# Patient Record
Sex: Female | Born: 1965 | Race: White | Hispanic: No | Marital: Married | Smoking: Former smoker
Health system: Southern US, Community
[De-identification: ages and names within clinical notes are randomized; demographics above are authoritative.]

## PROBLEM LIST (undated history)

## (undated) DIAGNOSIS — Z8719 Personal history of other diseases of the digestive system: Secondary | ICD-10-CM

## (undated) DIAGNOSIS — E785 Hyperlipidemia, unspecified: Secondary | ICD-10-CM

## (undated) DIAGNOSIS — K829 Disease of gallbladder, unspecified: Secondary | ICD-10-CM

## (undated) DIAGNOSIS — D259 Leiomyoma of uterus, unspecified: Secondary | ICD-10-CM

## (undated) DIAGNOSIS — H269 Unspecified cataract: Secondary | ICD-10-CM

## (undated) DIAGNOSIS — Z8639 Personal history of other endocrine, nutritional and metabolic disease: Secondary | ICD-10-CM

## (undated) DIAGNOSIS — T4145XA Adverse effect of unspecified anesthetic, initial encounter: Secondary | ICD-10-CM

## (undated) DIAGNOSIS — Z9221 Personal history of antineoplastic chemotherapy: Secondary | ICD-10-CM

## (undated) DIAGNOSIS — Z803 Family history of malignant neoplasm of breast: Secondary | ICD-10-CM

## (undated) DIAGNOSIS — Z923 Personal history of irradiation: Secondary | ICD-10-CM

## (undated) DIAGNOSIS — K2210 Ulcer of esophagus without bleeding: Secondary | ICD-10-CM

## (undated) DIAGNOSIS — E079 Disorder of thyroid, unspecified: Secondary | ICD-10-CM

## (undated) DIAGNOSIS — M7500 Adhesive capsulitis of unspecified shoulder: Secondary | ICD-10-CM

## (undated) DIAGNOSIS — N649 Disorder of breast, unspecified: Secondary | ICD-10-CM

## (undated) DIAGNOSIS — K449 Diaphragmatic hernia without obstruction or gangrene: Secondary | ICD-10-CM

## (undated) DIAGNOSIS — H409 Unspecified glaucoma: Secondary | ICD-10-CM

## (undated) DIAGNOSIS — T8859XA Other complications of anesthesia, initial encounter: Secondary | ICD-10-CM

## (undated) DIAGNOSIS — C50919 Malignant neoplasm of unspecified site of unspecified female breast: Secondary | ICD-10-CM

## (undated) HISTORY — PX: BREAST LUMPECTOMY: SHX2

## (undated) HISTORY — DX: Adhesive capsulitis of unspecified shoulder: M75.00

## (undated) HISTORY — DX: Disorder of thyroid, unspecified: E07.9

## (undated) HISTORY — PX: OTHER SURGICAL HISTORY: SHX169

## (undated) HISTORY — DX: Ulcer of esophagus without bleeding: K22.10

## (undated) HISTORY — PX: APPENDECTOMY: SHX54

## (undated) HISTORY — DX: Malignant neoplasm of unspecified site of unspecified female breast: C50.919

## (undated) HISTORY — PX: CATARACT EXTRACTION, BILATERAL: SHX1313

## (undated) HISTORY — DX: Hyperlipidemia, unspecified: E78.5

## (undated) HISTORY — DX: Personal history of other endocrine, nutritional and metabolic disease: Z86.39

## (undated) HISTORY — DX: Family history of malignant neoplasm of breast: Z80.3

## (undated) HISTORY — DX: Unspecified cataract: H26.9

## (undated) HISTORY — PX: ABDOMINAL HYSTERECTOMY: SHX81

## (undated) HISTORY — DX: Disorder of breast, unspecified: N64.9

## (undated) HISTORY — DX: Unspecified glaucoma: H40.9

## (undated) HISTORY — PX: TUBAL LIGATION: SHX77

## (undated) HISTORY — PX: WISDOM TOOTH EXTRACTION: SHX21

---

## 2008-06-29 ENCOUNTER — Other Ambulatory Visit: Admission: RE | Admit: 2008-06-29 | Discharge: 2008-06-29 | Payer: Self-pay | Admitting: Obstetrics & Gynecology

## 2008-07-04 ENCOUNTER — Ambulatory Visit (HOSPITAL_COMMUNITY): Admission: RE | Admit: 2008-07-04 | Discharge: 2008-07-04 | Payer: Self-pay | Admitting: Obstetrics & Gynecology

## 2008-07-10 ENCOUNTER — Ambulatory Visit (HOSPITAL_COMMUNITY): Admission: RE | Admit: 2008-07-10 | Discharge: 2008-07-10 | Payer: Self-pay | Admitting: Obstetrics & Gynecology

## 2009-01-08 ENCOUNTER — Ambulatory Visit (HOSPITAL_COMMUNITY): Admission: RE | Admit: 2009-01-08 | Discharge: 2009-01-08 | Payer: Self-pay | Admitting: Obstetrics and Gynecology

## 2009-09-14 ENCOUNTER — Other Ambulatory Visit: Admission: RE | Admit: 2009-09-14 | Discharge: 2009-09-14 | Payer: Self-pay | Admitting: Obstetrics & Gynecology

## 2009-09-19 ENCOUNTER — Ambulatory Visit (HOSPITAL_COMMUNITY): Admission: RE | Admit: 2009-09-19 | Discharge: 2009-09-19 | Payer: Self-pay | Admitting: Obstetrics & Gynecology

## 2011-01-29 ENCOUNTER — Other Ambulatory Visit: Payer: Self-pay | Admitting: Obstetrics and Gynecology

## 2011-01-29 ENCOUNTER — Other Ambulatory Visit (HOSPITAL_COMMUNITY)
Admission: RE | Admit: 2011-01-29 | Discharge: 2011-01-29 | Disposition: A | Discharge status: Home / Self Care | Payer: Managed Care, Other (non HMO) | Source: Ambulatory Visit | Attending: Obstetrics and Gynecology | Admitting: Obstetrics and Gynecology

## 2011-01-29 DIAGNOSIS — Z01419 Encounter for gynecological examination (general) (routine) without abnormal findings: Secondary | ICD-10-CM | POA: Insufficient documentation

## 2011-01-29 DIAGNOSIS — Z139 Encounter for screening, unspecified: Secondary | ICD-10-CM

## 2011-02-06 ENCOUNTER — Ambulatory Visit (HOSPITAL_COMMUNITY)
Admission: RE | Admit: 2011-02-06 | Discharge: 2011-02-06 | Disposition: A | Discharge status: Home / Self Care | Payer: Managed Care, Other (non HMO) | Source: Ambulatory Visit | Attending: Obstetrics and Gynecology | Admitting: Obstetrics and Gynecology

## 2011-02-06 DIAGNOSIS — Z1231 Encounter for screening mammogram for malignant neoplasm of breast: Secondary | ICD-10-CM | POA: Insufficient documentation

## 2011-02-06 DIAGNOSIS — Z139 Encounter for screening, unspecified: Secondary | ICD-10-CM

## 2011-02-25 NOTE — Op Note (Signed)
NAME:  Maria Hester, Maria Hester NO.:  [id number]   MEDICAL RECORD NO.:  [id number]          PATIENT TYPE:  OUT   LOCATION:  RAD                           FACILITY:  [facility]   PHYSICIAN:  Tilda Burrow, M.D. DATE OF BIRTH:  1965/10/21   DATE OF PROCEDURE:  DATE OF DISCHARGE:                               OPERATIVE REPORT   PROCEDURE:  Hysterosalpingogram.   INDICATIONS:  A 44 year old female 18 months status post surgical tubal  reversal, desired fertility workup, HSG is performed.  Her LMP began on  January 02, 2009, ending January 05, 2009, on schedule.  Only 2 days  earlier, she had had a serum progesterone level in the office just a  couple of days ago.  After a consent was obtained, time out performed  and the patient agrees with procedure, the patient was placed in supine  position and hysterosalpingogram performed by [facility]  with single tooth tenaculum, placing a hook  catheter in the cervical  canal and injecting a total of 8 mL of contrast media into the uterus  and watching for spillage.  There was a little bit of loss around the  cervix, but the uterus filled quite nicely under a rather significant  amount of pressure.  Tubes did fill.  A tiny diameter with spillage into  the peritoneal cavity bilaterally noted at about 6 mL of volume.  There  was a limited spillage on the right and tendency toward loculation.  The  left side had a prompt spillage at the end of the tube with easy  dispersal.   IMPRESSION:  1. Bilateral tubal patency.2.  Possible paratubal loculations on the      right.   PLAN:  The patient to proceed.  The patient was given doxycycline 100 mg  b.i.d. x3 days, Motrin for pain, and rest.  To follow up in our office  for any complications or fever and to follow up in 3 weeks.  We will  check progesterone level on day 21 of the cycle, has a semen analysis  completed this Friday, January 12, 2009, and see the patient in  approximately  3 weeks for discussion of results and consideration of  referral to fertility specialist.      Tilda Burrow, M.D.  Electronically Signed     JVF/MEDQ  D:  01/08/2009  T:  01/09/2009  Job:  [id number]

## 2012-02-04 ENCOUNTER — Other Ambulatory Visit (HOSPITAL_COMMUNITY)
Admission: RE | Admit: 2012-02-04 | Discharge: 2012-02-04 | Disposition: A | Discharge status: Home / Self Care | Payer: Managed Care, Other (non HMO) | Source: Ambulatory Visit | Attending: Obstetrics and Gynecology | Admitting: Obstetrics and Gynecology

## 2012-02-04 ENCOUNTER — Other Ambulatory Visit: Payer: Self-pay | Admitting: Obstetrics and Gynecology

## 2012-02-04 DIAGNOSIS — Z01419 Encounter for gynecological examination (general) (routine) without abnormal findings: Secondary | ICD-10-CM | POA: Insufficient documentation

## 2012-02-04 DIAGNOSIS — N6459 Other signs and symptoms in breast: Secondary | ICD-10-CM

## 2012-02-11 ENCOUNTER — Ambulatory Visit (HOSPITAL_COMMUNITY)
Admission: RE | Admit: 2012-02-11 | Discharge: 2012-02-11 | Disposition: A | Discharge status: Home / Self Care | Payer: Managed Care, Other (non HMO) | Source: Ambulatory Visit | Attending: Obstetrics and Gynecology | Admitting: Obstetrics and Gynecology

## 2012-02-11 ENCOUNTER — Other Ambulatory Visit: Payer: Self-pay | Admitting: Obstetrics and Gynecology

## 2012-02-11 DIAGNOSIS — N6459 Other signs and symptoms in breast: Secondary | ICD-10-CM

## 2012-02-11 DIAGNOSIS — N63 Unspecified lump in unspecified breast: Secondary | ICD-10-CM | POA: Insufficient documentation

## 2013-11-21 ENCOUNTER — Other Ambulatory Visit: Payer: Self-pay | Admitting: Obstetrics and Gynecology

## 2013-11-21 DIAGNOSIS — Z139 Encounter for screening, unspecified: Secondary | ICD-10-CM

## 2013-12-19 ENCOUNTER — Encounter (INDEPENDENT_AMBULATORY_CARE_PROVIDER_SITE_OTHER): Payer: Self-pay

## 2013-12-19 ENCOUNTER — Ambulatory Visit (HOSPITAL_COMMUNITY)
Admission: RE | Admit: 2013-12-19 | Discharge: 2013-12-19 | Disposition: A | Discharge status: Home / Self Care | Payer: 59 | Source: Ambulatory Visit | Attending: Obstetrics and Gynecology | Admitting: Obstetrics and Gynecology

## 2013-12-19 ENCOUNTER — Encounter: Payer: Self-pay | Admitting: Obstetrics and Gynecology

## 2013-12-19 ENCOUNTER — Other Ambulatory Visit (HOSPITAL_COMMUNITY)
Admission: RE | Admit: 2013-12-19 | Discharge: 2013-12-19 | Disposition: A | Discharge status: Home / Self Care | Payer: 59 | Source: Ambulatory Visit | Attending: Obstetrics and Gynecology | Admitting: Obstetrics and Gynecology

## 2013-12-19 ENCOUNTER — Ambulatory Visit (INDEPENDENT_AMBULATORY_CARE_PROVIDER_SITE_OTHER): Payer: 59 | Admitting: Obstetrics and Gynecology

## 2013-12-19 VITALS — BP 110/56 | Ht 65.5 in | Wt 184.8 lb

## 2013-12-19 DIAGNOSIS — Z01419 Encounter for gynecological examination (general) (routine) without abnormal findings: Secondary | ICD-10-CM | POA: Insufficient documentation

## 2013-12-19 DIAGNOSIS — N920 Excessive and frequent menstruation with regular cycle: Secondary | ICD-10-CM

## 2013-12-19 DIAGNOSIS — Z1231 Encounter for screening mammogram for malignant neoplasm of breast: Secondary | ICD-10-CM | POA: Insufficient documentation

## 2013-12-19 DIAGNOSIS — Z139 Encounter for screening, unspecified: Secondary | ICD-10-CM

## 2013-12-19 DIAGNOSIS — D259 Leiomyoma of uterus, unspecified: Secondary | ICD-10-CM

## 2013-12-19 DIAGNOSIS — Z1151 Encounter for screening for human papillomavirus (HPV): Secondary | ICD-10-CM | POA: Insufficient documentation

## 2013-12-19 DIAGNOSIS — Z1212 Encounter for screening for malignant neoplasm of rectum: Secondary | ICD-10-CM

## 2013-12-19 DIAGNOSIS — Z0000 Encounter for general adult medical examination without abnormal findings: Secondary | ICD-10-CM | POA: Insufficient documentation

## 2013-12-19 DIAGNOSIS — N946 Dysmenorrhea, unspecified: Secondary | ICD-10-CM | POA: Insufficient documentation

## 2013-12-19 LAB — COMPREHENSIVE METABOLIC PANEL
ALBUMIN: 4.3 g/dL (ref 3.5–5.2)
ALK PHOS: 59 U/L (ref 39–117)
ALT: 12 U/L (ref 0–35)
AST: 15 U/L (ref 0–37)
BUN: 16 mg/dL (ref 6–23)
CALCIUM: 9.4 mg/dL (ref 8.4–10.5)
CHLORIDE: 103 meq/L (ref 96–112)
CO2: 27 mEq/L (ref 19–32)
Creat: 0.67 mg/dL (ref 0.50–1.10)
GLUCOSE: 82 mg/dL (ref 70–99)
POTASSIUM: 4.5 meq/L (ref 3.5–5.3)
SODIUM: 138 meq/L (ref 135–145)
TOTAL PROTEIN: 6.7 g/dL (ref 6.0–8.3)
Total Bilirubin: 0.4 mg/dL (ref 0.2–1.2)

## 2013-12-19 LAB — LIPID PANEL
Cholesterol: 266 mg/dL — ABNORMAL HIGH (ref 0–200)
HDL: 48 mg/dL (ref >39)
LDL CALC: 182 mg/dL — AB (ref 0–99)
TRIGLYCERIDES: 179 mg/dL — AB (ref <150)
Total CHOL/HDL Ratio: 5.5 Ratio
VLDL: 36 mg/dL (ref 0–40)

## 2013-12-19 LAB — CBC
HEMATOCRIT: 41.4 % (ref 36.0–46.0)
HEMOGLOBIN: 13.9 g/dL (ref 12.0–15.0)
MCH: 29.8 pg (ref 26.0–34.0)
MCHC: 33.6 g/dL (ref 30.0–36.0)
MCV: 88.7 fL (ref 78.0–100.0)
Platelets: 287 10*3/uL (ref 150–400)
RBC: 4.67 MIL/uL (ref 3.87–5.11)
RDW: 14.2 % (ref 11.5–15.5)
WBC: 8.6 10*3/uL (ref 4.0–10.5)

## 2013-12-19 LAB — POC HEMOCCULT BLD/STL (OFFICE/1-CARD/DIAGNOSTIC): FECAL OCCULT BLD: NEGATIVE

## 2013-12-19 NOTE — Addendum Note (Signed)
Addended by: [name] on: 12/19/2013 01:07 PM   Modules accepted: Orders

## 2013-12-19 NOTE — Addendum Note (Signed)
Addended by: Traci Sermon A on: 12/19/2013 12:22 PM   Modules accepted: Orders

## 2013-12-19 NOTE — Patient Instructions (Signed)
Fibroids Fibroids are lumps (tumors) that can occur any place in a woman's body. These lumps are not cancerous. Fibroids vary in size, weight, and where they grow. HOME CARE  Do not take aspirin.  Write down the number of pads or tampons you use during your period. Tell your doctor. This can help determine the best treatment for you. GET HELP RIGHT AWAY IF:  You have pain in your lower belly (abdomen) that is not helped with medicine.  You have cramps that are not helped with medicine.  You have more bleeding between or during your period.  You feel lightheaded or pass out (faint).  Your lower belly pain gets worse. MAKE SURE YOU:  Understand these instructions.  Will watch your condition.  Will get help right away if you are not doing well or get worse. Document Released: 11/01/2010 Document Revised: 12/22/2011 Document Reviewed: 11/01/2010 [employer] Patient Information 2014 [employer].  

## 2013-12-19 NOTE — Progress Notes (Signed)
This chart was scribed by Ludger Nutting, Medical Scribe, for Dr. Mallory Shirk on 3/9/15at 11:49 AM. This chart was reviewed by Dr. Mallory Shirk and is accurate.   Assessment:  1.Annual Gyn Exam 2.Symptomatic uterine fibroids 12-14 week size 3. Dysmenorrhea , dysmenorrhea   Plan:  1. pap smear done, next pap due 3 years  2. Return 1wk for u/s and discussion of tx options 3    Annual mammogram advised, will have one today  4. Pelvic u/s 5. Annual labs TSH CMP, CBC, Lipid panel Subjective:  Maria Hester is a 48 y.o. female No obstetric history on file. who presents for annual exam. Patient's last menstrual period was 11/28/2013. The patient has complaints today of abdominal pressure when sitting down. She reports mood swings and irritability. She is having heavy and irregular periods; states periods can start in 24 days or 44 days. She has to use 3 regular sized tampons in 1 hour on heaviest days   The following portions of the patient's history were reviewed and updated as appropriate: allergies, current medications, past family history, past medical history, past social history, past surgical history and problem list.  Review of Systems Constitutional: positive for irritability, weight gain despite 1200 calorie diet and exercise Gastrointestinal: negative Genitourinary: irregular and heavy menses soaks 3 tampons /hour on heaviest day  Objective:  BP 110/56  Ht 5' 5.5" (1.664 m)  Wt 184 lb 12.8 oz (83.825 kg)  BMI 30.27 kg/m2  LMP 11/28/2013   BMI: Body mass index is 30.27 kg/(m^2).  General Appearance: Alert, appropriate appearance for age. No acute distress HEENT: Grossly normal Neck / Thyroid:  Cardiovascular: RRR; normal S1, S2, no murmur Lungs: CTA bilaterally Back: No CVAT Breast Exam: Normal to inspection, Normal breast tissue bilaterally and No masses or nodes.No dimpling, nipple retraction or discharge. Gastrointestinal: Soft, non-tender, no masses or  organomegaly Pelvic Exam: External genitalia: normal general appearance Vaginal: normal mucosa without prolapse or lesions and normal without tenderness, induration or masses Cervix: normal appearance Adnexa: normal bimanual exam Uterus: Uterus midplane, large fibroid to right and uterine body pushed to left. Total size is 12-14 week Rectovaginal: normal rectal, no masses and guaiac negative stool obtained Lymphatic Exam: Non-palpable nodes in neck, clavicular, axillary, or inguinal regions  Skin: no rash or abnormalities Neurologic: Normal gait and speech, no tremor  Psychiatric: Alert and oriented, appropriate affect.  Urinalysis:Not done  Mallory Shirk. MD Pgr [phone] 11:47 AM

## 2013-12-20 LAB — TSH: TSH: 1.574 u[IU]/mL (ref 0.350–4.500)

## 2013-12-21 ENCOUNTER — Other Ambulatory Visit: Payer: Self-pay | Admitting: Obstetrics and Gynecology

## 2013-12-21 DIAGNOSIS — R928 Other abnormal and inconclusive findings on diagnostic imaging of breast: Secondary | ICD-10-CM

## 2013-12-28 ENCOUNTER — Ambulatory Visit: Payer: 59 | Admitting: Obstetrics and Gynecology

## 2013-12-29 ENCOUNTER — Ambulatory Visit (INDEPENDENT_AMBULATORY_CARE_PROVIDER_SITE_OTHER): Payer: 59

## 2013-12-29 ENCOUNTER — Ambulatory Visit (INDEPENDENT_AMBULATORY_CARE_PROVIDER_SITE_OTHER): Payer: 59 | Admitting: Obstetrics and Gynecology

## 2013-12-29 ENCOUNTER — Encounter: Payer: Self-pay | Admitting: Obstetrics and Gynecology

## 2013-12-29 VITALS — BP 104/56 | Ht 65.5 in | Wt 180.0 lb

## 2013-12-29 DIAGNOSIS — N946 Dysmenorrhea, unspecified: Secondary | ICD-10-CM

## 2013-12-29 DIAGNOSIS — D259 Leiomyoma of uterus, unspecified: Secondary | ICD-10-CM

## 2013-12-29 DIAGNOSIS — N920 Excessive and frequent menstruation with regular cycle: Secondary | ICD-10-CM

## 2013-12-29 NOTE — Progress Notes (Signed)
This chart was scribed by Ludger Nutting, Medical Scribe, for Dr. Mallory Shirk on 3/19/15at 11:58 AM. This chart was reviewed by Dr. Mallory Shirk and is accurate.   [facility] Visit  Patient name: Maria Hester MRN [id number]  Date of birth: 03-14-1966  CC & HPI:  Maria Hester is a 48 y.o. female presenting today for follow up after u/s this morning. Ultrasound shows several uterine fibroids measuring 2 cm, 3 cm, 5 cm, and 2 cm in size. Uterus is approximately 600 g. She states she started current period 2 days ago   ROS:  Negative except noted above.   Pertinent History Reviewed:  Medical & Surgical Hx:  Reviewed: Significant for   History reviewed. No pertinent past medical history.   Past Surgical History  Procedure Laterality Date  . Wisdom tooth extraction    . Appendectomy    . Tubal ligation    . Reverse tubal ligation      Medications: Reviewed & Updated - see associated section Social History: Reviewed -  reports that she has been smoking Cigarettes.  She has been smoking about 0.50 packs per day. She has never used smokeless tobacco.  Objective Findings:  Vitals: BP 104/56  Ht 5' 5.5" (1.664 m)  Wt 180 lb (81.647 kg)  BMI 29.49 kg/m2  LMP 12/27/2013  Physical Examination: General appearance - alert, well appearing, and in no distress and oriented to person, place, and time Abdomen - soft, nontender, nondistended, no masses or organomegaly Pelvic - examination not indicated Physical Examination: Abdomen - soft, nontender, nondistended, no masses or organomegaly Uterus palpable at suprapubic area, moderate tenderness, due to uterine contact. Retracted scar from tubal reversal, pt desires excision at [facility].     Assessment & Plan:   A: 1. Uterine fibroids  2. Menorrhagia  3. Dysmenorrhea   P: 1. Endometrial biopsy in 2 weeks.  2. Discuss liposuction with plastic surgeon, info to patient at next visit 3. Will proceed toward Abd hyst , bilat  salpingectomy, wide excision of cicatrix, and make decision about removal of cervix at next visit.

## 2014-01-04 ENCOUNTER — Ambulatory Visit (HOSPITAL_COMMUNITY)
Admission: RE | Admit: 2014-01-04 | Discharge: 2014-01-04 | Disposition: A | Discharge status: Home / Self Care | Payer: 59 | Source: Ambulatory Visit | Attending: Obstetrics and Gynecology | Admitting: Obstetrics and Gynecology

## 2014-01-04 DIAGNOSIS — R928 Other abnormal and inconclusive findings on diagnostic imaging of breast: Secondary | ICD-10-CM | POA: Diagnosis present

## 2014-01-06 ENCOUNTER — Telehealth: Payer: Self-pay

## 2014-01-10 ENCOUNTER — Telehealth: Payer: Self-pay | Admitting: *Deleted

## 2014-01-10 NOTE — Telephone Encounter (Signed)
Pt states Dr. Glo Herring was going to give her a name for a Programmer, systems. Pt would like for Dr. Glo Herring to return call.

## 2014-01-12 ENCOUNTER — Ambulatory Visit (INDEPENDENT_AMBULATORY_CARE_PROVIDER_SITE_OTHER): Payer: 59 | Admitting: Obstetrics and Gynecology

## 2014-01-12 ENCOUNTER — Encounter: Payer: Self-pay | Admitting: Obstetrics and Gynecology

## 2014-01-12 ENCOUNTER — Other Ambulatory Visit: Payer: Self-pay | Admitting: Obstetrics and Gynecology

## 2014-01-12 VITALS — BP 100/60 | Ht 65.0 in | Wt 177.0 lb

## 2014-01-12 DIAGNOSIS — D219 Benign neoplasm of connective and other soft tissue, unspecified: Secondary | ICD-10-CM

## 2014-01-12 DIAGNOSIS — D259 Leiomyoma of uterus, unspecified: Secondary | ICD-10-CM

## 2014-01-12 DIAGNOSIS — N946 Dysmenorrhea, unspecified: Secondary | ICD-10-CM

## 2014-01-12 DIAGNOSIS — Z01818 Encounter for other preprocedural examination: Secondary | ICD-10-CM

## 2014-01-12 DIAGNOSIS — Z3200 Encounter for pregnancy test, result unknown: Secondary | ICD-10-CM

## 2014-01-12 DIAGNOSIS — N920 Excessive and frequent menstruation with regular cycle: Secondary | ICD-10-CM

## 2014-01-12 DIAGNOSIS — Z3202 Encounter for pregnancy test, result negative: Secondary | ICD-10-CM

## 2014-01-12 LAB — POCT URINE PREGNANCY: PREG TEST UR: NEGATIVE

## 2014-01-12 NOTE — Patient Instructions (Addendum)
Salpingectomy Salpingectomy, also called tubectomy, is the surgical removal of one of the fallopian tubes. The fallopian tubes are tubes that are connected to the uterus. These tubes transport the egg from the ovary to the uterus. A salpingectomy may be done for various reasons, including:   A tubal (ectopic) pregnancy. This is especially true if the tube ruptures.  An infected fallopian tube.  The need to remove the fallopian tube when removing an ovary with a cyst or tumor.  The need to remove the fallopian tube when removing the uterus.  Cancer of the fallopian tube or nearby organs.  Supracervical Hysterectomy A supracervical hysterectomy is surgery to remove the top part of the uterus, but not the cervix. You will no longer have menstrual periods or be able to get pregnant after this surgery. The fallopian tubes and ovaries may also be removed (bilateral salpingo-oophorectomy) during this surgery. This surgery is usually performed using a minimally invasive technique called laparoscopy. This technique allows the surgery to be done through small incisions. The minimally invasive technique provides benefits such as less pain, less risk of infection, and shorter recovery time. LET [facility] CARE PROVIDER KNOW ABOUT:  Any allergies you have.  All medicines you are taking, including vitamins, herbs, eye drops, creams, and over-the-counter medicines.  Previous problems you or members of your family have had with the use of anesthetics.  Any blood disorders you have.  Previous surgeries you have had.  Medical conditions you have. RISKS AND COMPLICATIONS  Generally, this is a safe procedure. However, as with any procedure, complications can occur. Possible complications include:  Bleeding.  Blood clots in the legs or lung.  Infection.  Injury to surrounding organs.  Problems related to anesthesia.  Conversion to an open abdominal surgery.  Additional surgery later to remove  the cervix if you have problems with the cervix. BEFORE THE PROCEDURE  Ask your health care provider about changing or stopping your regular medicines.  Do not take aspirin or blood thinners (anticoagulants) for 1 week before the surgery, or as directed by your health care provider.  Do not eat or drink anything for 8 hours before the surgery, or as directed by your health care provider.  Quit smoking if you smoke.  Arrange for a ride home after surgery and for someone to help you at home during recovery. PROCEDURE   You will be given an antibiotic medicine.  An IV tube will be placed in one of your veins. You will be given medicine to make you sleep (general anesthetic).  A gas (carbon dioxide) will be used to inflate your abdomen. This will allow your surgeon to look inside your abdomen, perform your surgery, and treat any other problems found if necessary.  Three or four small incisions will be made in your abdomen. One of these incisions will be made in the area of your belly button (navel). A thin, flexible tube with a tiny camera and light on the end of it (laparoscope) will be inserted into the incision. The camera on the laparoscope sends a picture to a TV screen in the operating room. This gives your surgeon a good view inside the abdomen.  Other surgical instruments will be inserted through the other incisions.  The uterus will be cut into small pieces and removed through the small incisions.  Your incisions will be closed. AFTER THE PROCEDURE   You will be taken to a recovery area where your progress will be monitored until you are awake,  stable, and taking fluids well. If there are no other problems, you will then be moved to a regular hospital room, or you will be allowed to go home.  You will likely have minimal discomfort after the surgery because the incisions are so small with the laparoscopic technique.  You will be given pain medicine while you are in the hospital  and for when you go home.  If a bilateral salpingo-oophorectomy was performed before menopause, you will go through a sudden (abrupt) menopause. This can be helped with hormone medicines. Document Released: 03/17/2008 Document Revised: 07/20/2013 Document Reviewed: 04/01/2013 [facility] Patient Information 2014 [place].

## 2014-01-12 NOTE — Progress Notes (Signed)
This chart was scribed by Jenne Campus, Medical Scribe, for Dr. Mallory Shirk on 01/12/14 at 11:52 AM. This chart was reviewed by Dr. Mallory Shirk and is accurate.   Pt took 1000 mg IBUprofen for comfort prior to procedure. Informed pt that plastic surgeon has closed in town. Pt has lost 10 lbs since last visit. LNMP 12/27/13.  Endometrial Biopsy: Patient given informed consent, signed copy in the chart, time out was performed. Time out taken. The patient was placed in the lithotomy position and the cervix brought into view with sterile speculum.  Portio of cervix cleansed x 2 with betadine swabs.  A tenaculum was placed in the anterior lip of the cervix. The uterus was sounded for depth of 8 cm. Milex uterine Explora 3 mm was introduced to into the uterus, suction created, and an endometrial sample was obtained. All equipment was removed and accounted for. The patient tolerated the procedure well.   Patient given post procedure instructions.  Followup:   A: 1. Uterine fibroid 2. Menorrhagia 3. Dysmenorrhea  P: 1. Will proceed toward supracervical abdominal hysterectomy, bilateral salpingectomy, wide excision of cicatrix scheduled 02/28/14.  2. Abdominoplasty with reconstruction of umbilicus planned  3. Weight loss discussed 4. Preop visit for early May

## 2014-01-23 DIAGNOSIS — Z029 Encounter for administrative examinations, unspecified: Secondary | ICD-10-CM

## 2014-01-26 NOTE — Telephone Encounter (Signed)
Done

## 2014-02-06 ENCOUNTER — Ambulatory Visit (INDEPENDENT_AMBULATORY_CARE_PROVIDER_SITE_OTHER): Payer: 59 | Admitting: Obstetrics and Gynecology

## 2014-02-06 ENCOUNTER — Encounter: Payer: Self-pay | Admitting: Obstetrics and Gynecology

## 2014-02-06 VITALS — BP 108/70 | Ht 65.5 in | Wt 170.8 lb

## 2014-02-06 DIAGNOSIS — L739 Follicular disorder, unspecified: Secondary | ICD-10-CM | POA: Insufficient documentation

## 2014-02-06 DIAGNOSIS — D259 Leiomyoma of uterus, unspecified: Secondary | ICD-10-CM

## 2014-02-06 DIAGNOSIS — N946 Dysmenorrhea, unspecified: Secondary | ICD-10-CM

## 2014-02-06 DIAGNOSIS — N920 Excessive and frequent menstruation with regular cycle: Secondary | ICD-10-CM

## 2014-02-06 DIAGNOSIS — Z113 Encounter for screening for infections with a predominantly sexual mode of transmission: Secondary | ICD-10-CM

## 2014-02-06 DIAGNOSIS — Z01818 Encounter for other preprocedural examination: Secondary | ICD-10-CM

## 2014-02-06 MED ORDER — CEPHALEXIN 500 MG PO CAPS: 500.0000 mg | ORAL_CAPSULE | Freq: Four times a day (QID) | ORAL | Status: DC

## 2014-02-06 MED ORDER — NICOTINE 21 MG/24HR TD PT24: 21.0000 mg | MEDICATED_PATCH | Freq: Every day | TRANSDERMAL | Status: DC

## 2014-02-06 NOTE — Addendum Note (Signed)
Addended by: Traci Sermon A on: 02/06/2014 12:15 PM   Modules accepted: Orders

## 2014-02-06 NOTE — Progress Notes (Signed)
This chart was scribed by Ludger Nutting, Medical Scribe, for Dr. Mallory Shirk on 02/06/14 at 11:49 AM. This chart was reviewed by Dr. Mallory Shirk and is accurate.   Preoperative History and Physical  Maria Hester is a 48 y.o. No obstetric history on file. here for surgical management of uterine fibroids, dysmenorrhea.   Proposed surgery: abdominal supracervical hysterectomy with bilateral salpingectomy and panniculectomy on 02/28/14  History reviewed. No pertinent past medical history. Past Surgical History  Procedure Laterality Date  . Wisdom tooth extraction    . Appendectomy    . Tubal ligation    . Reverse tubal ligation    . Cataract extraction, bilateral     OB History   Grav Para Term Preterm Abortions TAB SAB Ect Mult Living                 Patient denies any cervical dysplasia or STIs.  (Not in a hospital admission)  No Known Allergies Social History:   reports that she has been smoking Cigarettes.  She has been smoking about 0.50 packs per day. She has never used smokeless tobacco. She reports that she does not drink alcohol or use illicit drugs. Family History  Problem Relation Age of Onset  . Heart disease Mother   . Hypertension Mother   . Diabetes Father   . Heart disease Father   . Cancer Maternal Grandmother     intestines  . Heart disease Maternal Grandmother   . Heart disease Maternal Grandfather   . Cancer Maternal Grandfather     esophageal  . Diabetes Paternal Grandmother   . Heart disease Paternal Grandmother   . Tuberculosis Paternal Grandfather   . Heart disease Paternal Grandfather     Review of Systems: constipation, no BM for the past 4 days   PHYSICAL EXAM: Blood pressure 108/70, height 5' 5.5" (1.664 m), weight 170 lb 12.8 oz (77.474 kg), last menstrual period 12/27/2013. General appearance - alert, well appearing, and in no distress Chest - clear to auscultation, no wheezes, rales or rhonchi, symmetric air entry Heart - normal rate  and regular rhythm Abdomen - soft, nontender, nondistended, no masses or organomegaly Pelvic - Pelvic exam:  VULVA: normal appearing vulva with no masses, tenderness or lesions,  VAGINA: normal appearing vagina with normal color and discharge, no lesions,  CERVIX: normal appearing cervix without discharge or lesions, well supported but deviated to patient left.  UTERUS: uterus is normal size, shape, consistency and nontender,  ADNEXA: normal adnexa in size, nontender and no masses.  Extremities - peripheral pulses normal, no pedal edema, no clubbing or cyanosis  Labs: No results found for this or any previous visit (from the past 336 hour(s)).  Imaging Studies: No results found.  Assessment: Patient Active Problem List   Diagnosis Date Noted  . Fibroids 01/12/2014  . Menorrhagia 01/12/2014  . Pre-op testing 01/12/2014  . Annual physical exam 12/19/2013  . Uterine fibroid 12/19/2013  . Dysmenorrhea 12/19/2013    Plan: Given antibiotic today for vulvar follicle, inflamed, also nicoderm patch to start 1 wk befor e surgery  Patient will undergo surgical management with supracervical abdominal hysterectomy with bilateral salpingectomy and panniculectomy.   The risks of surgery were discussed in detail with the patient including but not limited to: bleeding which may require transfusion or reoperation; infection which may require antibiotics; injury to surrounding organs which may involve bowel, bladder, ureters ; need for additional procedures including laparoscopy or laparotomy; thromboembolic phenomenon, surgical site problems and  other postoperative/anesthesia complications. Likelihood of success in alleviating the patient's condition was discussed. Routine postoperative instructions will be reviewed with the patient and her family in detail after surgery.  The patient concurred with the proposed plan, giving informed written consent for the surgery.  Patient has been NPO since last night  she will remain NPO for procedure.  Anesthesia and OR aware.  Preoperative prophylactic antibiotics and SCDs ordered on call to the OR.  To OR when ready.  [name], M.D. 02/06/2014 11:49 AM

## 2014-02-07 LAB — GC/CHLAMYDIA PROBE AMP
CT PROBE, AMP APTIMA: NEGATIVE
GC Probe RNA: NEGATIVE

## 2014-02-14 ENCOUNTER — Encounter (HOSPITAL_COMMUNITY): Payer: Self-pay | Admitting: Pharmacy Technician

## 2014-02-22 NOTE — Patient Instructions (Addendum)
Maria Hester  02/22/2014   Your procedure is scheduled on:  02/28/2014  Report to [facility] at  06:15  AM.  Call this number if you have problems the morning of surgery: [phone]   Remember:   Do not eat food or drink liquids after midnight.   Take these medicines the morning of surgery with A SIP OF WATER: none  Do not wear jewelry, make-up or nail polish.  Do not wear lotions, powders, or perfumes.   Do not shave 48 hours prior to surgery. Men may shave face and neck.  Do not bring valuables to the hospital.  [facility] is not responsible for any belongings or valuables.               Contacts, dentures or bridgework may not be worn into surgery.  Leave suitcase in the car. After surgery it may be brought to your room.  For patients admitted to the hospital, discharge time is determined by your treatment team.               Patients discharged the day of surgery will not be allowed to drive home.   Special Instructions: Shower using CHG 1 night before surgery and the morning of surgery. Use special wash - you have one bottle of CHG for both showers.  You should use approximately 1/2 of the bottle for each shower.   Please read over the following fact sheets that you were given: Anesthesia Post-op Instructions and Care and Recovery After Surgery   Supracervical Hysterectomy A supracervical hysterectomy is surgery to remove the top part of the uterus, but not the cervix. You will no longer have menstrual periods or be able to get pregnant after this surgery. The fallopian tubes and ovaries may also be removed (bilateral salpingo-oophorectomy) during this surgery. This surgery is usually performed using a minimally invasive technique called laparoscopy. This technique allows the surgery to be done through small incisions. The minimally invasive technique provides benefits such as less pain, less risk of infection, and shorter recovery time. LET [facility] CARE PROVIDER  KNOW ABOUT:  Any allergies you have.  All medicines you are taking, including vitamins, herbs, eye drops, creams, and over-the-counter medicines.  Previous problems you or members of your family have had with the use of anesthetics.  Any blood disorders you have.  Previous surgeries you have had.  Medical conditions you have. RISKS AND COMPLICATIONS  Generally, this is a safe procedure. However, as with any procedure, complications can occur. Possible complications include:  Bleeding.  Blood clots in the legs or lung.  Infection.  Injury to surrounding organs.  Problems related to anesthesia.  Conversion to an open abdominal surgery.  Additional surgery later to remove the cervix if you have problems with the cervix. BEFORE THE PROCEDURE  Ask your health care provider about changing or stopping your regular medicines.  Do not take aspirin or blood thinners (anticoagulants) for 1 week before the surgery, or as directed by your health care provider.  Do not eat or drink anything for 8 hours before the surgery, or as directed by your health care provider.  Quit smoking if you smoke.  Arrange for a ride home after surgery and for someone to help you at home during recovery. PROCEDURE   You will be given an antibiotic medicine.  An IV tube will be placed in one of your veins. You will be given medicine to make you sleep (general  anesthetic).  A gas (carbon dioxide) will be used to inflate your abdomen. This will allow your surgeon to look inside your abdomen, perform your surgery, and treat any other problems found if necessary.  Three or four small incisions will be made in your abdomen. One of these incisions will be made in the area of your belly button (navel). A thin, flexible tube with a tiny camera and light on the end of it (laparoscope) will be inserted into the incision. The camera on the laparoscope sends a picture to a TV screen in the operating room. This gives  your surgeon a good view inside the abdomen.  Other surgical instruments will be inserted through the other incisions.  The uterus will be cut into small pieces and removed through the small incisions.  Your incisions will be closed. AFTER THE PROCEDURE   You will be taken to a recovery area where your progress will be monitored until you are awake, stable, and taking fluids well. If there are no other problems, you will then be moved to a regular hospital room, or you will be allowed to go home.  You will likely have minimal discomfort after the surgery because the incisions are so small with the laparoscopic technique.  You will be given pain medicine while you are in the hospital and for when you go home.  If a bilateral salpingo-oophorectomy was performed before menopause, you will go through a sudden (abrupt) menopause. This can be helped with hormone medicines. Document Released: 03/17/2008 Document Revised: 07/20/2013 Document Reviewed: 04/01/2013 [facility] Patient Information 2014 [place].   PATIENT INSTRUCTIONS POST-ANESTHESIA  IMMEDIATELY FOLLOWING SURGERY:  Do not drive or operate machinery for the first twenty four hours after surgery.  Do not make any important decisions for twenty four hours after surgery or while taking narcotic pain medications or sedatives.  If you develop intractable nausea and vomiting or a severe headache please notify your doctor immediately.  FOLLOW-UP:  Please make an appointment with your surgeon as instructed. You do not need to follow up with anesthesia unless specifically instructed to do so.  WOUND CARE INSTRUCTIONS (if applicable):  Keep a dry clean dressing on the anesthesia/puncture wound site if there is drainage.  Once the wound has quit draining you may leave it open to air.  Generally you should leave the bandage intact for twenty four hours unless there is drainage.  If the epidural site drains for more than 36-48 hours please  call the anesthesia department.  QUESTIONS?:  Please feel free to call your physician or the hospital operator if you have any questions, and they will be happy to assist you.

## 2014-02-23 ENCOUNTER — Other Ambulatory Visit: Payer: Self-pay | Admitting: Obstetrics and Gynecology

## 2014-02-23 ENCOUNTER — Encounter (HOSPITAL_COMMUNITY)
Admission: RE | Admit: 2014-02-23 | Discharge: 2014-02-23 | Disposition: A | Discharge status: Home / Self Care | Payer: 59 | Source: Ambulatory Visit | Attending: Obstetrics and Gynecology | Admitting: Obstetrics and Gynecology

## 2014-02-23 ENCOUNTER — Encounter (HOSPITAL_COMMUNITY): Payer: Self-pay

## 2014-02-23 DIAGNOSIS — Z01812 Encounter for preprocedural laboratory examination: Secondary | ICD-10-CM | POA: Insufficient documentation

## 2014-02-23 HISTORY — DX: Leiomyoma of uterus, unspecified: D25.9

## 2014-02-23 HISTORY — DX: Adverse effect of unspecified anesthetic, initial encounter: T41.45XA

## 2014-02-23 HISTORY — DX: Other complications of anesthesia, initial encounter: T88.59XA

## 2014-02-23 LAB — CBC
HCT: 39.3 % (ref 36.0–46.0)
HEMOGLOBIN: 13.2 g/dL (ref 12.0–15.0)
MCH: 30.2 pg (ref 26.0–34.0)
MCHC: 33.6 g/dL (ref 30.0–36.0)
MCV: 89.9 fL (ref 78.0–100.0)
Platelets: 277 10*3/uL (ref 150–400)
RBC: 4.37 MIL/uL (ref 3.87–5.11)
RDW: 12.3 % (ref 11.5–15.5)
WBC: 7.5 10*3/uL (ref 4.0–10.5)

## 2014-02-23 LAB — URINALYSIS, ROUTINE W REFLEX MICROSCOPIC
BILIRUBIN URINE: NEGATIVE
GLUCOSE, UA: NEGATIVE mg/dL
Hgb urine dipstick: NEGATIVE
KETONES UR: NEGATIVE mg/dL
Leukocytes, UA: NEGATIVE
Nitrite: NEGATIVE
Protein, ur: NEGATIVE mg/dL
Specific Gravity, Urine: 1.025 (ref 1.005–1.030)
Urobilinogen, UA: 0.2 mg/dL (ref 0.0–1.0)
pH: 5.5 (ref 5.0–8.0)

## 2014-02-23 LAB — BASIC METABOLIC PANEL
BUN: 13 mg/dL (ref 6–23)
CALCIUM: 9.7 mg/dL (ref 8.4–10.5)
CO2: 29 mEq/L (ref 19–32)
Chloride: 101 mEq/L (ref 96–112)
Creatinine, Ser: 0.69 mg/dL (ref 0.50–1.10)
Glucose, Bld: 96 mg/dL (ref 70–99)
Potassium: 4.5 mEq/L (ref 3.7–5.3)
SODIUM: 141 meq/L (ref 137–147)

## 2014-02-23 LAB — TYPE AND SCREEN
ABO/RH(D): A POS
Antibody Screen: NEGATIVE

## 2014-02-23 LAB — HCG, SERUM, QUALITATIVE: PREG SERUM: NEGATIVE

## 2014-02-28 ENCOUNTER — Encounter (HOSPITAL_COMMUNITY): Payer: 59 | Admitting: Anesthesiology

## 2014-02-28 ENCOUNTER — Encounter (HOSPITAL_COMMUNITY)
Admission: RE | Disposition: A | Discharge status: Home / Self Care | Payer: Self-pay | Source: Ambulatory Visit | Attending: Obstetrics and Gynecology

## 2014-02-28 ENCOUNTER — Inpatient Hospital Stay (HOSPITAL_COMMUNITY)
Admission: RE | Admit: 2014-02-28 | Discharge: 2014-03-02 | DRG: 743 | Disposition: A | Discharge status: Home / Self Care | Payer: 59 | Source: Ambulatory Visit | Attending: Obstetrics and Gynecology | Admitting: Obstetrics and Gynecology

## 2014-02-28 ENCOUNTER — Encounter (HOSPITAL_COMMUNITY): Payer: Self-pay | Admitting: *Deleted

## 2014-02-28 ENCOUNTER — Ambulatory Visit (HOSPITAL_COMMUNITY): Payer: 59 | Admitting: Anesthesiology

## 2014-02-28 DIAGNOSIS — IMO0002 Reserved for concepts with insufficient information to code with codable children: Secondary | ICD-10-CM

## 2014-02-28 DIAGNOSIS — N8000 Endometriosis of the uterus, unspecified: Secondary | ICD-10-CM | POA: Diagnosis present

## 2014-02-28 DIAGNOSIS — F17200 Nicotine dependence, unspecified, uncomplicated: Secondary | ICD-10-CM | POA: Diagnosis present

## 2014-02-28 DIAGNOSIS — N946 Dysmenorrhea, unspecified: Secondary | ICD-10-CM | POA: Diagnosis present

## 2014-02-28 DIAGNOSIS — D259 Leiomyoma of uterus, unspecified: Principal | ICD-10-CM | POA: Diagnosis present

## 2014-02-28 DIAGNOSIS — Z90711 Acquired absence of uterus with remaining cervical stump: Secondary | ICD-10-CM

## 2014-02-28 DIAGNOSIS — N920 Excessive and frequent menstruation with regular cycle: Secondary | ICD-10-CM | POA: Diagnosis present

## 2014-02-28 DIAGNOSIS — L905 Scar conditions and fibrosis of skin: Secondary | ICD-10-CM

## 2014-02-28 DIAGNOSIS — D251 Intramural leiomyoma of uterus: Secondary | ICD-10-CM

## 2014-02-28 DIAGNOSIS — K5900 Constipation, unspecified: Secondary | ICD-10-CM | POA: Diagnosis present

## 2014-02-28 DIAGNOSIS — Z833 Family history of diabetes mellitus: Secondary | ICD-10-CM

## 2014-02-28 DIAGNOSIS — Z8249 Family history of ischemic heart disease and other diseases of the circulatory system: Secondary | ICD-10-CM

## 2014-02-28 HISTORY — PX: SCAR REVISION: SHX5285

## 2014-02-28 HISTORY — PX: BILATERAL SALPINGECTOMY: SHX5743

## 2014-02-28 HISTORY — PX: SUPRACERVICAL ABDOMINAL HYSTERECTOMY: SHX5393

## 2014-02-28 HISTORY — PX: ABDOMINOPLASTY: SHX5355

## 2014-02-28 LAB — CBC
HCT: 34 % — ABNORMAL LOW (ref 36.0–46.0)
Hemoglobin: 11.3 g/dL — ABNORMAL LOW (ref 12.0–15.0)
MCH: 30.1 pg (ref 26.0–34.0)
MCHC: 33.2 g/dL (ref 30.0–36.0)
MCV: 90.4 fL (ref 78.0–100.0)
Platelets: 247 10*3/uL (ref 150–400)
RBC: 3.76 MIL/uL — AB (ref 3.87–5.11)
RDW: 12.5 % (ref 11.5–15.5)
WBC: 11.4 10*3/uL — ABNORMAL HIGH (ref 4.0–10.5)

## 2014-02-28 SURGERY — HYSTERECTOMY, SUPRACERVICAL, ABDOMINAL
Anesthesia: General | Site: Abdomen

## 2014-02-28 MED ORDER — DOCUSATE SODIUM 100 MG PO CAPS
100.0000 mg | ORAL_CAPSULE | Freq: Two times a day (BID) | ORAL | Status: DC
  Administered 2014-03-01 – 2014-03-02 (×3): 100 mg via ORAL
  Filled 2014-02-28 (×3): qty 1

## 2014-02-28 MED ORDER — LACTATED RINGERS IV SOLN
INTRAVENOUS | Status: DC
  Administered 2014-02-28 (×3): via INTRAVENOUS

## 2014-02-28 MED ORDER — LIDOCAINE HCL 1 % IJ SOLN
INTRAMUSCULAR | Status: DC | PRN
  Administered 2014-02-28: 50 mg via INTRADERMAL

## 2014-02-28 MED ORDER — 0.9 % SODIUM CHLORIDE (POUR BTL) OPTIME
TOPICAL | Status: DC | PRN
  Administered 2014-02-28: 2000 mL

## 2014-02-28 MED ORDER — MIDAZOLAM HCL 2 MG/2ML IJ SOLN
INTRAMUSCULAR | Status: AC
  Filled 2014-02-28: qty 2

## 2014-02-28 MED ORDER — SODIUM CHLORIDE 0.9 % IJ SOLN
INTRAMUSCULAR | Status: AC
  Filled 2014-02-28: qty 20

## 2014-02-28 MED ORDER — CEFAZOLIN SODIUM-DEXTROSE 2-3 GM-% IV SOLR
2.0000 g | INTRAVENOUS | Status: AC
  Administered 2014-02-28: 2 g via INTRAVENOUS
  Filled 2014-02-28: qty 50

## 2014-02-28 MED ORDER — DIPHENHYDRAMINE HCL 50 MG/ML IJ SOLN: 12.5000 mg | Freq: Four times a day (QID) | INTRAMUSCULAR | Status: DC | PRN

## 2014-02-28 MED ORDER — PROPOFOL 10 MG/ML IV BOLUS
INTRAVENOUS | Status: DC | PRN
  Administered 2014-02-28: 140 mg via INTRAVENOUS

## 2014-02-28 MED ORDER — PROPOFOL 10 MG/ML IV EMUL
INTRAVENOUS | Status: AC
  Filled 2014-02-28: qty 20

## 2014-02-28 MED ORDER — KETOROLAC TROMETHAMINE 30 MG/ML IJ SOLN
INTRAMUSCULAR | Status: AC
  Filled 2014-02-28: qty 1

## 2014-02-28 MED ORDER — FENTANYL CITRATE 0.05 MG/ML IJ SOLN
INTRAMUSCULAR | Status: AC
  Filled 2014-02-28: qty 2

## 2014-02-28 MED ORDER — ONDANSETRON HCL 4 MG/2ML IJ SOLN: 4.0000 mg | Freq: Four times a day (QID) | INTRAMUSCULAR | Status: DC | PRN

## 2014-02-28 MED ORDER — ROCURONIUM BROMIDE 50 MG/5ML IV SOLN
INTRAVENOUS | Status: AC
  Filled 2014-02-28: qty 1

## 2014-02-28 MED ORDER — GLYCOPYRROLATE 0.2 MG/ML IJ SOLN
INTRAMUSCULAR | Status: DC | PRN
  Administered 2014-02-28: 0.4 mg via INTRAVENOUS
  Administered 2014-02-28: 0.2 mg via INTRAVENOUS

## 2014-02-28 MED ORDER — SODIUM CHLORIDE 0.9 % IV SOLN
INTRAVENOUS | Status: DC
  Administered 2014-02-28 (×2): via INTRAVENOUS

## 2014-02-28 MED ORDER — SODIUM CHLORIDE 0.9 % IJ SOLN
INTRAMUSCULAR | Status: DC | PRN
  Administered 2014-02-28: 20 mL via INTRAVENOUS

## 2014-02-28 MED ORDER — BIOTENE DRY MOUTH MT LIQD
15.0000 mL | Freq: Two times a day (BID) | OROMUCOSAL | Status: DC
  Administered 2014-02-28 – 2014-03-02 (×4): 15 mL via OROMUCOSAL

## 2014-02-28 MED ORDER — KETOROLAC TROMETHAMINE 30 MG/ML IJ SOLN
30.0000 mg | Freq: Four times a day (QID) | INTRAMUSCULAR | Status: DC
  Filled 2014-02-28: qty 1

## 2014-02-28 MED ORDER — OXYCODONE-ACETAMINOPHEN 5-325 MG PO TABS
1.0000 | ORAL_TABLET | ORAL | Status: DC | PRN
  Administered 2014-03-01 – 2014-03-02 (×2): 1 via ORAL
  Filled 2014-02-28 (×2): qty 1

## 2014-02-28 MED ORDER — KETOROLAC TROMETHAMINE 30 MG/ML IJ SOLN
30.0000 mg | Freq: Once | INTRAMUSCULAR | Status: AC
  Administered 2014-02-28: 60 mg via INTRAVENOUS

## 2014-02-28 MED ORDER — FENTANYL CITRATE 0.05 MG/ML IJ SOLN
INTRAMUSCULAR | Status: AC
  Filled 2014-02-28: qty 5

## 2014-02-28 MED ORDER — BUPIVACAINE LIPOSOME 1.3 % IJ SUSP
20.0000 mL | Freq: Once | INTRAMUSCULAR | Status: AC
  Administered 2014-02-28: 37 mL
  Filled 2014-02-28: qty 20

## 2014-02-28 MED ORDER — PANTOPRAZOLE SODIUM 40 MG PO TBEC
40.0000 mg | DELAYED_RELEASE_TABLET | Freq: Every day | ORAL | Status: DC
  Administered 2014-03-01 – 2014-03-02 (×2): 40 mg via ORAL
  Filled 2014-02-28 (×2): qty 1

## 2014-02-28 MED ORDER — LIDOCAINE HCL (PF) 1 % IJ SOLN
INTRAMUSCULAR | Status: AC
  Filled 2014-02-28: qty 5

## 2014-02-28 MED ORDER — NEOSTIGMINE METHYLSULFATE 10 MG/10ML IV SOLN
INTRAVENOUS | Status: DC | PRN
  Administered 2014-02-28 (×3): 1 mg via INTRAVENOUS

## 2014-02-28 MED ORDER — FENTANYL CITRATE 0.05 MG/ML IJ SOLN
25.0000 ug | INTRAMUSCULAR | Status: DC | PRN
Start: 2014-02-28 — End: 2014-02-28

## 2014-02-28 MED ORDER — KETOROLAC TROMETHAMINE 30 MG/ML IJ SOLN
30.0000 mg | Freq: Four times a day (QID) | INTRAMUSCULAR | Status: DC
  Administered 2014-02-28 – 2014-03-02 (×8): 30 mg via INTRAVENOUS
  Filled 2014-02-28 (×8): qty 1

## 2014-02-28 MED ORDER — ONDANSETRON HCL 4 MG PO TABS: 4.0000 mg | ORAL_TABLET | Freq: Four times a day (QID) | ORAL | Status: DC | PRN

## 2014-02-28 MED ORDER — LACTATED RINGERS IV BOLUS (SEPSIS)
1000.0000 mL | Freq: Once | INTRAVENOUS | Status: AC
Start: 2014-02-28 — End: 2014-02-28
  Administered 2014-02-28: 1000 mL via INTRAVENOUS

## 2014-02-28 MED ORDER — NICOTINE 21 MG/24HR TD PT24
21.0000 mg | MEDICATED_PATCH | Freq: Every day | TRANSDERMAL | Status: DC
  Administered 2014-02-28 – 2014-03-02 (×3): 21 mg via TRANSDERMAL
  Filled 2014-02-28 (×3): qty 1

## 2014-02-28 MED ORDER — SODIUM CHLORIDE 0.9 % IJ SOLN: 9.0000 mL | INTRAMUSCULAR | Status: DC | PRN

## 2014-02-28 MED ORDER — NALOXONE HCL 0.4 MG/ML IJ SOLN: 0.4000 mg | INTRAMUSCULAR | Status: DC | PRN

## 2014-02-28 MED ORDER — ONDANSETRON HCL 4 MG/2ML IJ SOLN: 4.0000 mg | Freq: Once | INTRAMUSCULAR | Status: DC | PRN

## 2014-02-28 MED ORDER — ONDANSETRON HCL 4 MG/2ML IJ SOLN
INTRAMUSCULAR | Status: AC
  Filled 2014-02-28: qty 2

## 2014-02-28 MED ORDER — MIDAZOLAM HCL 5 MG/5ML IJ SOLN
INTRAMUSCULAR | Status: DC | PRN
  Administered 2014-02-28: 2 mg via INTRAVENOUS

## 2014-02-28 MED ORDER — ONDANSETRON HCL 4 MG/2ML IJ SOLN
4.0000 mg | Freq: Once | INTRAMUSCULAR | Status: AC
  Administered 2014-02-28: 4 mg via INTRAVENOUS

## 2014-02-28 MED ORDER — IBUPROFEN 600 MG PO TABS: 600.0000 mg | ORAL_TABLET | Freq: Four times a day (QID) | ORAL | Status: DC | PRN

## 2014-02-28 MED ORDER — BUPIVACAINE LIPOSOME 1.3 % IJ SUSP: INTRAMUSCULAR | Status: DC | PRN

## 2014-02-28 MED ORDER — MIDAZOLAM HCL 2 MG/2ML IJ SOLN
1.0000 mg | INTRAMUSCULAR | Status: DC | PRN
  Administered 2014-02-28: 2 mg via INTRAVENOUS

## 2014-02-28 MED ORDER — DEXAMETHASONE SODIUM PHOSPHATE 4 MG/ML IJ SOLN
INTRAMUSCULAR | Status: AC
  Filled 2014-02-28: qty 1

## 2014-02-28 MED ORDER — FENTANYL CITRATE 0.05 MG/ML IJ SOLN
INTRAMUSCULAR | Status: DC | PRN
  Administered 2014-02-28: 50 ug via INTRAVENOUS
  Administered 2014-02-28: 100 ug via INTRAVENOUS
  Administered 2014-02-28: 50 ug via INTRAVENOUS
  Administered 2014-02-28: 100 ug via INTRAVENOUS
  Administered 2014-02-28: 50 ug via INTRAVENOUS
  Administered 2014-02-28: 100 ug via INTRAVENOUS

## 2014-02-28 MED ORDER — DIPHENHYDRAMINE HCL 12.5 MG/5ML PO ELIX: 12.5000 mg | ORAL_SOLUTION | Freq: Four times a day (QID) | ORAL | Status: DC | PRN

## 2014-02-28 MED ORDER — HYDROMORPHONE 0.3 MG/ML IV SOLN
INTRAVENOUS | Status: DC
  Administered 2014-02-28: 2.6 mg via INTRAVENOUS
  Administered 2014-02-28: 12:00:00 via INTRAVENOUS
  Filled 2014-02-28: qty 25

## 2014-02-28 MED ORDER — ROCURONIUM BROMIDE 100 MG/10ML IV SOLN
INTRAVENOUS | Status: DC | PRN
  Administered 2014-02-28: 20 mg via INTRAVENOUS
  Administered 2014-02-28: 50 mg via INTRAVENOUS
  Administered 2014-02-28: 10 mg via INTRAVENOUS

## 2014-02-28 MED ORDER — GLYCOPYRROLATE 0.2 MG/ML IJ SOLN
INTRAMUSCULAR | Status: AC
  Filled 2014-02-28: qty 3

## 2014-02-28 MED ORDER — DEXAMETHASONE SODIUM PHOSPHATE 4 MG/ML IJ SOLN
4.0000 mg | Freq: Once | INTRAMUSCULAR | Status: AC
  Administered 2014-02-28: 4 mg via INTRAVENOUS

## 2014-02-28 SURGICAL SUPPLY — 65 items
BAG HAMPER (MISCELLANEOUS) ×3 IMPLANT
BENZOIN TINCTURE PRP APPL 2/3 (GAUZE/BANDAGES/DRESSINGS) ×3 IMPLANT
BINDER ABD UNIV 9 30-45 (GAUZE/BANDAGES/DRESSINGS) ×2 IMPLANT
BINDER ABDOMINAL 9 (GAUZE/BANDAGES/DRESSINGS) ×3
BLADE SURG ROTATE 9660 (MISCELLANEOUS) ×3 IMPLANT
CELLS DAT CNTRL 66122 CELL SVR (MISCELLANEOUS) ×2 IMPLANT
CLOTH BEACON ORANGE TIMEOUT ST (SAFETY) ×3 IMPLANT
COVER LIGHT HANDLE STERIS (MISCELLANEOUS) ×6 IMPLANT
DRAPE WARM FLUID 44X44 (DRAPE) ×3 IMPLANT
DURAPREP 26ML APPLICATOR (WOUND CARE) ×3 IMPLANT
ELECT REM PT RETURN 9FT ADLT (ELECTROSURGICAL) ×3
ELECTRODE REM PT RTRN 9FT ADLT (ELECTROSURGICAL) ×2 IMPLANT
EVACUATOR DRAINAGE 10X20 100CC (DRAIN) ×2 IMPLANT
EVACUATOR SILICONE 100CC (DRAIN) ×1
FORMALIN 10 PREFIL 480ML (MISCELLANEOUS) ×3 IMPLANT
GAUZE PACKING 2X5 YD STRL (GAUZE/BANDAGES/DRESSINGS) ×3 IMPLANT
GLOVE BIOGEL PI IND STRL 7.0 (GLOVE) ×2 IMPLANT
GLOVE BIOGEL PI IND STRL 7.5 (GLOVE) ×2 IMPLANT
GLOVE BIOGEL PI IND STRL 8 (GLOVE) ×2 IMPLANT
GLOVE BIOGEL PI IND STRL 9 (GLOVE) ×2 IMPLANT
GLOVE BIOGEL PI INDICATOR 7.0 (GLOVE) ×1
GLOVE BIOGEL PI INDICATOR 7.5 (GLOVE) ×1
GLOVE BIOGEL PI INDICATOR 8 (GLOVE) ×1
GLOVE BIOGEL PI INDICATOR 9 (GLOVE) ×1
GLOVE ECLIPSE 7.0 STRL STRAW (GLOVE) ×3 IMPLANT
GLOVE ECLIPSE 9.0 STRL (GLOVE) ×3 IMPLANT
GLOVE SURG SS PI 7.5 STRL IVOR (GLOVE) ×3 IMPLANT
GOWN SPEC L3 XXLG W/TWL (GOWN DISPOSABLE) ×6 IMPLANT
GOWN STRL REUS W/TWL LRG LVL3 (GOWN DISPOSABLE) ×6 IMPLANT
INST SET MAJOR GENERAL (KITS) ×3 IMPLANT
KIT ROOM TURNOVER APOR (KITS) ×3 IMPLANT
MANIFOLD NEPTUNE II (INSTRUMENTS) ×3 IMPLANT
NEEDLE HYPO 18GX1.5 BLUNT FILL (NEEDLE) ×3 IMPLANT
NEEDLE HYPO 25X1 1.5 SAFETY (NEEDLE) ×3 IMPLANT
NS IRRIG 1000ML POUR BTL (IV SOLUTION) ×6 IMPLANT
PACK ABDOMINAL MAJOR (CUSTOM PROCEDURE TRAY) ×3 IMPLANT
PAD ABD 5X9 TENDERSORB (GAUZE/BANDAGES/DRESSINGS) ×6 IMPLANT
PAD ARMBOARD 7.5X6 YLW CONV (MISCELLANEOUS) ×3 IMPLANT
RETRACTOR WND ALEXIS 25 LRG (MISCELLANEOUS) IMPLANT
RTRCTR WOUND ALEXIS 18CM MED (MISCELLANEOUS) ×3
RTRCTR WOUND ALEXIS 25CM LRG (MISCELLANEOUS)
SET BASIN LINEN APH (SET/KITS/TRAYS/PACK) ×3 IMPLANT
SOL PREP PROV IODINE SCRUB 4OZ (MISCELLANEOUS) ×3 IMPLANT
SPONGE DRAIN TRACH 4X4 STRL 2S (GAUZE/BANDAGES/DRESSINGS) IMPLANT
SPONGE GAUZE 4X4 12PLY (GAUZE/BANDAGES/DRESSINGS) ×6 IMPLANT
SPONGE LAP 18X18 X RAY DECT (DISPOSABLE) ×3 IMPLANT
STAPLER VISISTAT 35W (STAPLE) IMPLANT
STRIP CLOSURE SKIN 1/2X4 (GAUZE/BANDAGES/DRESSINGS) ×6 IMPLANT
SUT CHROMIC 0 CT 1 (SUTURE) ×36 IMPLANT
SUT CHROMIC 2 0 CT 1 (SUTURE) ×6 IMPLANT
SUT CHROMIC GUT BROWN 0 54 (SUTURE) IMPLANT
SUT CHROMIC GUT BROWN 0 54IN (SUTURE)
SUT ETHILON 3 0 FSL (SUTURE) ×3 IMPLANT
SUT PDS AB CT VIOLET #0 27IN (SUTURE) ×3 IMPLANT
SUT PLAIN CT 1/2CIR 2-0 27IN (SUTURE) ×27 IMPLANT
SUT PROLENE 0 CT 1 30 (SUTURE) ×6 IMPLANT
SUT VIC AB 0 CT1 27 (SUTURE) ×1
SUT VIC AB 0 CT1 27XBRD ANTBC (SUTURE) ×2 IMPLANT
SUT VICRYL 4 0 KS 27 (SUTURE) ×6 IMPLANT
SUT VICRYL AB 2 0 TIES (SUTURE) ×3 IMPLANT
SYR 20CC LL (SYRINGE) ×3 IMPLANT
SYR 30ML LL (SYRINGE) ×3 IMPLANT
TOWEL BLUE STERILE X RAY DET (MISCELLANEOUS) ×3 IMPLANT
TOWEL OR 17X26 4PK STRL BLUE (TOWEL DISPOSABLE) IMPLANT
TRAY FOLEY CATH 16FR SILVER (SET/KITS/TRAYS/PACK) ×3 IMPLANT

## 2014-02-28 NOTE — Care Management Utilization Note (Signed)
UR completed 

## 2014-02-28 NOTE — H&P (Signed)
Preoperative History and Physical  Maria Hester is a 48 y.o. No obstetric history on file. here for surgical management of uterine fibroids, dysmenorrhea.  Proposed surgery: abdominal supracervical hysterectomy with bilateral salpingectomy and panniculectomy on 02/28/14  History reviewed. No pertinent past medical history.  Past Surgical History   Procedure  Laterality  Date   .  Wisdom tooth extraction     .  Appendectomy     .  Tubal ligation     .  Reverse tubal ligation     .  Cataract extraction, bilateral      OB History    Grav  Para  Term  Preterm  Abortions  TAB  SAB  Ect  Mult  Living                 Patient denies any cervical dysplasia or STIs.  (Not in a hospital admission)  No Known Allergies  Social History: reports that she has been smoking Cigarettes. She has been smoking about 0.50 packs per day. She has never used smokeless tobacco. She reports that she does not drink alcohol or use illicit drugs.  Family History   Problem  Relation  Age of Onset   .  Heart disease  Mother    .  Hypertension  Mother    .  Diabetes  Father    .  Heart disease  Father    .  Cancer  Maternal Grandmother      intestines   .  Heart disease  Maternal Grandmother    .  Heart disease  Maternal Grandfather    .  Cancer  Maternal Grandfather      esophageal   .  Diabetes  Paternal Grandmother    .  Heart disease  Paternal Grandmother    .  Tuberculosis  Paternal Grandfather    .  Heart disease  Paternal Grandfather    Review of Systems: constipation, no BM for the past 4 days  PHYSICAL EXAM:  Blood pressure 108/70, height 5' 5.5" (1.664 m), weight 170 lb 12.8 oz (77.474 kg), last menstrual period 12/27/2013.  General appearance - alert, well appearing, and in no distress  Chest - clear to auscultation, no wheezes, rales or rhonchi, symmetric air entry  Heart - normal rate and regular rhythm  Abdomen - soft, nontender, nondistended, no masses or organomegaly  Uterus palpable at  suprapubic area, moderate tenderness, due to uterine contact.  Retracted scar from tubal reversal, pt desires excision at [facility].    Pelvic - Pelvic exam:  VULVA: normal appearing vulva with no masses, tenderness or lesions,  VAGINA: normal appearing vagina with normal color and discharge, no lesions,  CERVIX: normal appearing cervix without discharge or lesions, well supported but deviated to patient left.  UTERUS:Uterus midplane, large fibroid to right and uterine body pushed to left. Total size is 12-14 week ADNEXA: normal adnexa in size, nontender and no masses.  Extremities - peripheral pulses normal, no pedal edema, no clubbing or cyanosis  Labs:  No results found for this or any previous visit (from the past 336 hour(s)).  Imaging Studies:  No results found.  Assessment:  Patient Active Problem List    Diagnosis  Date Noted   .  Fibroids  01/12/2014   .  Menorrhagia  01/12/2014   .  Pre-op testing  01/12/2014   .  Annual physical exam  12/19/2013   .  Uterine fibroid  12/19/2013   .  Dysmenorrhea  12/19/2013   Plan:  Given antibiotic today for vulvar follicle, inflamed, also nicoderm patch to start 1 wk befor e surgery  Patient will undergo surgical management with supracervical abdominal hysterectomy with bilateral salpingectomy and panniculectomy. The risks of surgery were discussed in detail with the patient including but not limited to: bleeding which may require transfusion or reoperation; infection which may require antibiotics; injury to surrounding organs which may involve bowel, bladder, ureters ; need for additional procedures including laparoscopy or laparotomy; thromboembolic phenomenon, surgical site problems and other postoperative/anesthesia complications. Likelihood of success in alleviating the patient's condition was discussed. Routine postoperative instructions will be reviewed with the patient and her family in detail after surgery. The patient concurred with the  proposed plan, giving informed written consent for the surgery. Patient has been NPO since last night she will remain NPO for procedure. Anesthesia and OR aware. Preoperative prophylactic antibiotics and SCDs ordered on call to the OR. To OR 7:30 on 02/28/14.  [name], M.D.  02/06/2014 11:49 AM

## 2014-02-28 NOTE — Anesthesia Preprocedure Evaluation (Signed)
Anesthesia Evaluation  Patient identified by MRN, date of birth, ID band Patient awake    Reviewed: Allergy & Precautions, H&P , NPO status , Patient's Chart, lab work & pertinent test results  History of Anesthesia Complications (+) PROLONGED EMERGENCE and history of anesthetic complications  Airway Mallampati: II TM Distance: >3 FB Neck ROM: Full    Dental  (+) Teeth Intact   Pulmonary Current Smoker (am cough),  breath sounds clear to auscultation        Cardiovascular negative cardio ROS  Rhythm:Regular Rate:Normal     Neuro/Psych    GI/Hepatic negative GI ROS,   Endo/Other    Renal/GU      Musculoskeletal   Abdominal   Peds  Hematology   Anesthesia Other Findings   Reproductive/Obstetrics                           Anesthesia Physical Anesthesia Plan  ASA: II  Anesthesia Plan: General   Post-op Pain Management:    Induction: Intravenous  Airway Management Planned:   Additional Equipment:   Intra-op Plan:   Post-operative Plan: Extubation in OR  Informed Consent: I have reviewed the patients History and Physical, chart, labs and discussed the procedure including the risks, benefits and alternatives for the proposed anesthesia with the patient or authorized representative who has indicated his/her understanding and acceptance.     Plan Discussed with:   Anesthesia Plan Comments:         Anesthesia Quick Evaluation

## 2014-02-28 NOTE — Brief Op Note (Addendum)
02/28/2014  10:25 AM  PATIENT:  Maria Hester  48 y.o. female  PRE-OPERATIVE DIAGNOSIS:  fibroids menorrhagia dyspareunia old scar revision  POST-OPERATIVE DIAGNOSIS:  fibroids menorrhagia dyspareunia old scar revision  PROCEDURE:  Procedure(s): HYSTERECTOMY SUPRACERVICAL ABDOMINAL (N/A) BILATERAL SALPINGECTOMY (Bilateral) SCAR REVISION (N/A) ABDOMINOPLASTY with Panniculectomy (N/A)  SURGEON:  Surgeon(s) and Role:    * [name], MD - Primary  PHYSICIAN ASSISTANT:   ASSISTANTS: Lynnell Dike RN-FA   ANESTHESIA:   local and general  EBL:  Total I/O In: 2800 [I.V.:2800] Out: 350 [Urine:150; Blood:200]  BLOOD ADMINISTERED:none  DRAINS: (Subcutaneous space) Jackson-Pratt drain(s) with closed bulb suction in the Subcutaneous space and Urinary Catheter (Foley)   LOCAL MEDICATIONS USED:  Amount: 20 cc ml and OTHER Exparel  SPECIMEN:  Source of Specimen:  Uterus and bilateral fallopian tubes  DISPOSITION OF SPECIMEN:  PATHOLOGY  COUNTS:  YES  TOURNIQUET:  * No tourniquets in log *  DICTATION: .Dragon Dictation  PLAN OF CARE: Admit to inpatient   PATIENT DISPOSITION:  PACU - hemodynamically stable.   Delay start of Pharmacological VTE agent (>24hrs) due to surgical blood loss or risk of bleeding: not applicable

## 2014-02-28 NOTE — Transfer of Care (Signed)
Immediate Anesthesia Transfer of Care Note  Patient: Maria Hester  Procedure(s) Performed: Procedure(s): HYSTERECTOMY SUPRACERVICAL ABDOMINAL (N/A) BILATERAL SALPINGECTOMY (Bilateral) SCAR REVISION (N/A) ABDOMINOPLASTY with Panniculectomy (N/A)  Patient Location: PACU  Anesthesia Type:General  Level of Consciousness: awake and patient cooperative  Airway & Oxygen Therapy: Patient Spontanous Breathing and Patient connected to face mask oxygen  Post-op Assessment: Report given to PACU RN, Post -op Vital signs reviewed and stable and Patient moving all extremities  Post vital signs: Reviewed and stable  Complications: No apparent anesthesia complications

## 2014-02-28 NOTE — Anesthesia Postprocedure Evaluation (Signed)
  Anesthesia Post-op Note  Patient: Maria Hester  Procedure(s) Performed: Procedure(s): HYSTERECTOMY SUPRACERVICAL ABDOMINAL (N/A) BILATERAL SALPINGECTOMY (Bilateral) SCAR REVISION (N/A) ABDOMINOPLASTY with Panniculectomy (N/A)  Patient Location: PACU  Anesthesia Type:General  Level of Consciousness: awake, alert , oriented and patient cooperative  Airway and Oxygen Therapy: Patient Spontanous Breathing  Post-op Pain: 4 /10, moderate  Post-op Assessment: Post-op Vital signs reviewed, Patient's Cardiovascular Status Stable, Respiratory Function Stable, Patent Airway and NAUSEA AND VOMITING PRESENT  Post-op Vital Signs: Reviewed and stable  Last Vitals:  Filed Vitals:   02/28/14 0725  BP: 109/57  Pulse:   Temp:   Resp: 48    Complications: No apparent anesthesia complications

## 2014-02-28 NOTE — Progress Notes (Addendum)
Day of Surgery Procedure(s) (LRB): HYSTERECTOMY SUPRACERVICAL ABDOMINAL (N/A) BILATERAL SALPINGECTOMY (Bilateral) SCAR REVISION (N/A) ABDOMINOPLASTY with Panniculectomy (N/A)  Subjective: Patient reports minimal pain, just some backache..    Objective: I have reviewed patient's vital signs and intake and output.  General: alert, cooperative, no distress and pale GI: incision: dry and dressing in place, no drainage and abdomen no swelling. Vaginal Bleeding: none Jp drain has minimal 15 cc drainage since surgery (sub_Q location) Nursing reports that IV was disconnected and pt missed an undetermined amount of the iv fluids.  Intake/Output Summary (Last 24 hours) at 02/28/14 1847 Last data filed at 02/28/14 1800  Gross per 24 hour  Intake   2800 ml  Output    970 ml  Net   1830 ml   BP 118/74  Pulse 53  Temp(Src) 98.1 F (36.7 C) (Oral)  Resp 15  Ht 5' 5.5" (1.664 m)  Wt 166 lb 3.2 oz (75.388 kg)  BMI 27.23 kg/m2  SpO2 94%  Assessment: s/p Procedure(s): HYSTERECTOMY SUPRACERVICAL ABDOMINAL (N/A) BILATERAL SALPINGECTOMY (Bilateral) SCAR REVISION (N/A) ABDOMINOPLASTY with Panniculectomy (N/A): I am concerned over pt's pallor. will check cbc, give fluid bolus, and monitor urine output closely.  Plan: bolus ivfluids, check cbc, monitor I&O closely  LOS: 0 days    [name] 02/28/2014, 6:34 PM

## 2014-02-28 NOTE — Op Note (Signed)
02/28/2014  10:25 AM  PATIENT:  Maria Hester  48 y.o. female  PRE-OPERATIVE DIAGNOSIS:  fibroids menorrhagia dyspareunia old scar revision  POST-OPERATIVE DIAGNOSIS:  fibroids menorrhagia dyspareunia old scar revision  PROCEDURE:  Procedure(s): HYSTERECTOMY SUPRACERVICAL ABDOMINAL (N/A) BILATERAL SALPINGECTOMY (Bilateral) SCAR REVISION (N/A) ABDOMINOPLASTY with Panniculectomy (N/A)  SURGEON:  Surgeon(s) and Role:    * [name], MD - Primary  PHYSICIAN ASSISTANT:   ASSISTANTS: Lynnell Dike RN-FA   ANESTHESIA:   local and general  EBL:  Total I/O In: 2800 [I.V.:2800] Out: 350 [Urine:150; Blood:200]  BLOOD ADMINISTERED:none  DRAINS: (Subcutaneous space) Jackson-Pratt drain(s) with closed bulb suction in the Subcutaneous space and Urinary Catheter (Foley)   LOCAL MEDICATIONS USED:  Amount: 20 cc ml and OTHER Exparel  Details of procedure: Patient was taken operating room prepped and draped for abdominal procedure with Foley catheter in place and vaginal prepping performed timeout was conducted and Ancef administered an operative team confirmed surgical plans the previously marked partial panniculectomy, including wide excision of the old retracted minilaparotomy scar was excised, with an elliptical incision and the method of Pfannenstiel extending from just lateral to the anterior superior iliac crest on the side to the midline, removing approximately a 40 cm long skin that was about 9 cm wide at its maximum central distance superficial skin and fatty tissue was removed being careful to leave a good layer of fatty tissue overlying the fascia itself. Hemostasis was satisfactory. Tube areas of bleeding in the area of the inferior epigastric vein on each side required individual point cautery to achieve adequate hemostasis. Once the ellipse of skin and fatty tissue was removed, the remaining fatty tissue was split in the midline, a midline vertical lower abdominal  incision opened and the method of Pelosi used. Peritoneal cavity was easily entered using identification of the peritoneum and with blunt entry of the last layer of peritoneum with sharp dissection superiorly and inferiorly. The Alexis medium retractor was positioned, with laparotomy tapes moistened placed at each lateral pelvis and role radiographic laparotomy tape placed in the midline to keep the bowel out of the way. Uterus was irregular in shape, 12-14 week size, deep in the pelvis. Round ligaments were taken down on either side. The patient's left tube was very short, status post reanastomosis and irregular in its position. The right tube was more normal in appearance status post reanastomosis. A lateral salpingectomy was performed by transecting across the mesosalpinx and leaving the tube on the uterine specimen. Uterine specimen was mobilized but taken down the round ligament on each side with double suture ligature and transecting between them. Bladder flap was developed anteriorly. The utero-ovarian ligaments were isolated on either side doubly clamped cut and suture ligated. On the patient's left side the uterine vessels could be identified were crossclamped with curved Heaney clamp, transected and 0 chromics suture ligature performed. Backbleeding was controlled with figure-of-eight sutures on the side of the uterus. The patient's right side showed much larger fibroid posterior to the uterine vessels. The uterine vessels were skeletonized and crossclamped with 2 curved Heaney clamps and then Kelly clamp for backbleeding, transected and doubly ligated with 0 chromic suture. At this time the uterine body could be amputated off the lower uterine segment, after placing a malleable retractor behind the uterus to protect the bowel. The specimen was taken off and passed off the surgical field. The remaining vaginal cuff and lower uterine segment was grasped with Coker clamps, and a small portion of the  remaining lower uterine segment was excised and a conical fashion and then interrupted 0 chromic sutures pull the cuff together and closed with adequate hemostasis. Irrigation was performed hemostasis confirmed, pedicles inspected bilaterally and found to be hemostatic. The right ovary had a hemorrhagic corpus luteum cyst that bled throughout the case after manipulation third of bleeding early in the case. This was treated with through and through vertical mattress suture x2 and adequate hemostasis achieved. Pelvis was again irrigated hemostasis confirmed, and then laparotomy equipment removed, and anterior peritoneum closed with Vicryl, the fascia closed with continuous running 0 PDS, and subcutaneous tissues reapproximated. Particular care was taken to mobilize the upper edge of the incision closer to the midline and the lower edges of this time the incision to be pulled together inunacceptably cosmetic fashion. 2 layers of interrupted 20 plain sutures were placed in subcutaneous fatty tissue pulling the underlying tissues and approximation. A flat JP drain was placed in the depth of the subcutaneous tissue dissection and allowed to exit on the patient's right side through a separate stab incision 2 cm below the primary Pfannenstiel type incision this drain was sewn in place at the end of the case. Once the subcutaneous tissues were reapproximated satisfactorily, the skin was closed with continuous running 4-0 Vicryl on a Keith needle in a continuous running fashion. Then Steri-Strips were applied the skin and pressure dressing applied. The binder will be placed in the recovery room. Patient will go recovery room in good condition sponge and needle counts correct

## 2014-02-28 NOTE — Anesthesia Procedure Notes (Signed)
Procedure Name: Intubation Date/Time: 02/28/2014 7:41 AM Performed by: Charmaine Downs Pre-anesthesia Checklist: Suction available, Patient being monitored, Emergency Drugs available and Patient identified Patient Re-evaluated:Patient Re-evaluated prior to inductionOxygen Delivery Method: Circle system utilized Preoxygenation: Pre-oxygenation with 100% oxygen Intubation Type: IV induction Ventilation: Mask ventilation without difficulty and Oral airway inserted - appropriate to patient size Laryngoscope Size: Mac and 3 Grade View: Grade I Tube type: Oral Tube size: 7.0 mm Number of attempts: 1 Airway Equipment and Method: Stylet Placement Confirmation: ETT inserted through vocal cords under direct vision,  positive ETCO2 and breath sounds checked- equal and bilateral Secured at: 22 cm Tube secured with: Tape Dental Injury: Teeth and Oropharynx as per pre-operative assessment

## 2014-03-01 ENCOUNTER — Encounter (HOSPITAL_COMMUNITY): Payer: Self-pay | Admitting: Obstetrics and Gynecology

## 2014-03-01 LAB — CBC
HCT: 31.3 % — ABNORMAL LOW (ref 36.0–46.0)
Hemoglobin: 10.3 g/dL — ABNORMAL LOW (ref 12.0–15.0)
MCH: 30 pg (ref 26.0–34.0)
MCHC: 32.9 g/dL (ref 30.0–36.0)
MCV: 91.3 fL (ref 78.0–100.0)
PLATELETS: 230 10*3/uL (ref 150–400)
RBC: 3.43 MIL/uL — ABNORMAL LOW (ref 3.87–5.11)
RDW: 12.6 % (ref 11.5–15.5)
WBC: 11.3 10*3/uL — AB (ref 4.0–10.5)

## 2014-03-01 LAB — BASIC METABOLIC PANEL
BUN: 10 mg/dL (ref 6–23)
CALCIUM: 8 mg/dL — AB (ref 8.4–10.5)
CO2: 25 mEq/L (ref 19–32)
CREATININE: 0.6 mg/dL (ref 0.50–1.10)
Chloride: 106 mEq/L (ref 96–112)
GFR calc non Af Amer: >90 mL/min (ref >90)
Glucose, Bld: 85 mg/dL (ref 70–99)
Potassium: 4.6 mEq/L (ref 3.7–5.3)
Sodium: 140 mEq/L (ref 137–147)

## 2014-03-01 MED ORDER — SIMETHICONE 80 MG PO CHEW
80.0000 mg | CHEWABLE_TABLET | Freq: Four times a day (QID) | ORAL | Status: DC | PRN
  Administered 2014-03-01 – 2014-03-02 (×3): 80 mg via ORAL
  Filled 2014-03-01 (×3): qty 1

## 2014-03-01 MED ORDER — HYDROMORPHONE HCL PF 1 MG/ML IJ SOLN
1.0000 mg | INTRAMUSCULAR | Status: DC | PRN
  Administered 2014-03-01: 1 mg via INTRAVENOUS
  Filled 2014-03-01: qty 1

## 2014-03-01 NOTE — Anesthesia Postprocedure Evaluation (Signed)
  Anesthesia Post-op Note  Patient: Maria Hester  Procedure(s) Performed: Procedure(s): HYSTERECTOMY SUPRACERVICAL ABDOMINAL (N/A) BILATERAL SALPINGECTOMY (Bilateral) SCAR REVISION (N/A) ABDOMINOPLASTY with Panniculectomy (N/A)  Patient Location: Nursing Unit  Anesthesia Type:General  Level of Consciousness: awake  Airway and Oxygen Therapy: Patient Spontanous Breathing  Post-op Pain: mild  Post-op Assessment: Post-op Vital signs reviewed, Patient's Cardiovascular Status Stable, Respiratory Function Stable, Patent Airway and Pain level controlled  Post-op Vital Signs: Reviewed and stable  Last Vitals:  Filed Vitals:   03/01/14 0800  BP:   Pulse:   Temp:   Resp: 16    Complications: No apparent anesthesia complications

## 2014-03-01 NOTE — Progress Notes (Signed)
Nutrition Brief Note  Patient identified on the Malnutrition Screening Tool (MST) Report  S/p abdominal hysterectomy, bilateral salpingectomy, scar revision and abdominoplasty with panniculectomy.  Wt Readings from Last 15 Encounters:  02/28/14 166 lb 3.2 oz (75.388 kg)  02/28/14 166 lb 3.2 oz (75.388 kg)  02/23/14 166 lb 3.2 oz (75.388 kg)  02/06/14 170 lb 12.8 oz (77.474 kg)  01/12/14 177 lb (80.287 kg)  12/29/13 180 lb (81.647 kg)  12/19/13 184 lb 12.8 oz (83.825 kg)    Body mass index is 27.23 kg/(m^2). Patient meets criteria for overweight based on current BMI. Pt has experienced intentional, significant weight loss of 10%,18# since March due to diet and lifestyle changes.  Current diet order is regular and appetite is good. Labs and medications reviewed.   No nutrition interventions warranted at this time. Pt plans to d/c tomorrow.  Colman Cater MS,RD,CSG,LDN Office: [phone] Pager: [phone]

## 2014-03-01 NOTE — Progress Notes (Signed)
1 Day Post-Op Procedure(s) (LRB): HYSTERECTOMY SUPRACERVICAL ABDOMINAL (N/A) BILATERAL SALPINGECTOMY (Bilateral) SCAR REVISION (N/A) ABDOMINOPLASTY with Panniculectomy (N/A)  Subjective: Patient reports no problems voiding. Hungry, pain control good, minimal pca use   Objective: I have reviewed patient's vital signs, intake and output and labs.  General: alert, no distress and pale Resp: clear to auscultation bilaterally GI: soft, non-tender; bowel sounds normal; no masses,  no organomegaly and incision: clean, dry, intact and clear serous drainage in JP drain CBC    Component Value Date/Time   WBC 11.3* 03/01/2014 0607   RBC 3.43* 03/01/2014 0607   HGB 10.3* 03/01/2014 0607   HCT 31.3* 03/01/2014 0607   PLT 230 03/01/2014 0607   MCV 91.3 03/01/2014 0607   MCH 30.0 03/01/2014 0607   MCHC 32.9 03/01/2014 0607   RDW 12.6 03/01/2014 0607    Assessment: s/p Procedure(s): HYSTERECTOMY SUPRACERVICAL ABDOMINAL (N/A) BILATERAL SALPINGECTOMY (Bilateral) SCAR REVISION (N/A) ABDOMINOPLASTY with Panniculectomy (N/A): stable  Plan: Advance diet Encourage ambulation Advance to PO medication Discontinue IV fluids d/c midday Thurs.  LOS: 1 day    Maria Hester 03/01/2014, 8:19 AM

## 2014-03-01 NOTE — Progress Notes (Signed)
Pt ambulated approximately 200 feet in hallway, independently. Pt tolerated well.

## 2014-03-01 NOTE — Addendum Note (Signed)
Addendum created 03/01/14 1127 by Vista Deck, CRNA   Modules edited: Notes Section   Notes Section:  File: [id number]

## 2014-03-02 MED ORDER — KETOROLAC TROMETHAMINE 10 MG PO TABS: 10.0000 mg | ORAL_TABLET | Freq: Four times a day (QID) | ORAL | Status: DC | PRN

## 2014-03-02 MED ORDER — OXYCODONE-ACETAMINOPHEN 5-325 MG PO TABS: 1.0000 | ORAL_TABLET | ORAL | Status: DC | PRN

## 2014-03-02 NOTE — Discharge Instructions (Signed)
Hysterectomy Information °A hysterectomy is a surgery to remove your uterus. After surgery, you will no longer have periods. Also, you will not be able to get pregnant.  °REASONS FOR THIS SURGERY °· You have bleeding that is not normal and keeps coming back. °· You have lasting (chronic) lower belly (pelvic) pain. °· You have a lasting infection. °· The lining of your uterus grows outside your uterus. °· The lining of your uterus grows in the muscle of your uterus. °· Your uterus falls down into your vagina. °· You have a growth in your uterus that causes problems. °· You have cells that could turn into cancer (precancerous cells). °· You have cancer of the uterus or cervix. °TYPES  °There are 3 types of hysterectomies. Depending on the type, the surgery will: °· Remove the top part of the uterus only. °· Remove the uterus and the cervix. °· Remove the uterus, cervix, and tissue that holds the uterus in place in the lower belly. °WAYS A HYSTERECTOMY CAN BE PERFORMED °There are 5 ways this surgery can be performed.  °· A cut (incision) is made in the belly (abdomen). The uterus is taken out through the cut. °· A cut is made in the vagina. The uterus is taken out through the cut. °· Three or four cuts are made in the belly. A surgical device with a camera is put through one of the cuts. The uterus is cut into small pieces. The uterus is taken out through the cuts or the vagina. °· Three or four cuts are made in the belly. A surgical device with a camera is put through one of the cuts. The uterus is taken out through the vagina. °· Three or four cuts are made in the belly. A surgical device that is controlled by a computer makes a visual image. The device helps the surgeon control the surgical tools. The uterus is cut into small pieces. The pieces are taken out through the cuts or through the vagina. °WHAT TO EXPECT AFTER THE SURGERY °· You will be given pain medicine. °· You will need help at home for 3 5 days after  surgery. °· You will need to see your doctor in 2 4 weeks after surgery. °· You may get hot flashes, have night sweats, and have trouble sleeping. °· You may need to have Pap tests in the future if your surgery was related to cancer. Talk to your doctor. It is still good to have regular exams. °Document Released: 12/22/2011 Document Revised: 07/20/2013 Document Reviewed: 06/06/2013 °[employer]® Patient Information ©2014 [employer]. ° °

## 2014-03-02 NOTE — Progress Notes (Signed)
Patient ambulated in hallway. Slow steady gait. Able to pass gas. Denies pain except pressure from gas.

## 2014-03-02 NOTE — Progress Notes (Signed)
IV removed. Discharge instructions reviewed with patient and husband. Understanding verbalized. Taken out via wheelchair for discharge home.

## 2014-03-02 NOTE — Discharge Summary (Signed)
Physician Discharge Summary  Patient ID: Maria Hester MRN: [id number] DOB/AGE: Aug 11, 1966 48 y.o.  Admit date: 02/28/2014 Discharge date: 03/02/2014  Admission Diagnoses: Uterine fibroids dysmenorrhea abdominal wall laxity  Discharge Diagnoses: Same also adenomyosis Active Problems:   S/P subtotal hysterectomy  partial panniculectomy  Discharged Condition: good  Hospital Course: Patient was admitted 02/28/2014 through day surgery, underwent abdominal hysterectomy with cervix preserve the ovaries preserved fallopian tubes removed as well as uterine body. Subcutaneous J-P drain was placed in the and the panniculectomy portion of the case. Exparel was used for local analgesia with excellent results patient was afebrile and pain levels were resulted as a 2 out of 10 during most of the postop care. She was discharged home on postop day 2 her hemoglobin 10.3 compared to preop 13.2, normal at electrolytes   Consults: None  Significant Diagnostic Studies: labs: Hemoglobin 10.3, electrolytes normal and pathology report showing uterine fibroids and adenomyosis  Treatments: surgery: Abdominal hysterectomy supracervical bilateral salpingectomy, panniculectomy  Discharge Exam: Blood pressure 101/67, pulse 52, temperature 97.3 F (36.3 C), temperature source Oral, resp. rate 20, height 5' 5.5" (1.664 m), weight 75.388 kg (166 lb 3.2 oz), SpO2 96.00%. General appearance: alert, cooperative, appears stated age and no distress GI: normal findings: bowel sounds normal, umbilicus normal and Incision clean intact dressing in place but lifted for inspection. J-P drain functioning with minimal serous drainage Extremities: extremities normal, atraumatic, no cyanosis or edema  Disposition: Final discharge disposition not confirmed  Discharge Instructions   Call MD for:  persistant nausea and vomiting    Complete by:  As directed      Call MD for:  severe uncontrolled pain    Complete by:  As directed       Call MD for:  temperature >100.4    Complete by:  As directed      Call MD for:    Complete by:  As directed   Concern, [phone]     Diet - low sodium heart healthy    Complete by:  As directed      Discharge instructions    Complete by:  As directed   General Gynecological Post-Operative Instructions You may expect to feel dizzy, weak, and drowsy for as long as 24 hours after receiving the medicine that made you sleep (anesthetic).  Do not drive a car, ride a bicycle, participate in physical activities, or take public transportation until you are done taking narcotic pain medicines or as directed by your doctor.  Do not drink alcohol or take tranquilizers.  Do not take medicine that has not been prescribed by your doctor.  Do not sign important papers or make important decisions while on narcotic pain medicines.  Have a responsible person with you.  CARE OF INCISION  Keep incision clean and dry. Take showers instead of baths until your doctor gives you permission to take baths, 22 weeks Avoid heavy lifting (more than 10 pounds/4.5 kilograms), pushing, or pulling.  Avoid activities that may risk injury to your surgical site.  No sexual intercourse or placement of anything in the vagina for 6 weeks or as instructed by your doctor. If you have tubes coming from the wound site, check with your doctor regarding appropriate care of the tubes. Only take prescription or over-the-counter medicines  for pain, discomfort, or fever as directed by your doctor. Do not take aspirin. It can make you bleed. Take medicines (antibiotics) that kill germs if they are prescribed for you.  Call the office  or go to the MAU if:  You feel sick to your stomach (nauseous).  You start to throw up (vomit).  You have trouble eating or drinking.  You have an oral temperature above 101.  You have constipation that is not helped by adjusting diet or increasing fluid intake. Pain medicines are a common cause of  constipation.  You have any other concerns. SEEK IMMEDIATE MEDICAL CARE IF:  You have persistent dizziness.  You have difficulty breathing or a congested sounding (croupy) cough.  You have an oral temperature above 102.5, not controlled by medicine.  There is increasing pain or tenderness near or in the surgical site.     Increase activity slowly    Complete by:  As directed      Remove dressing in 24 hours    Complete by:  As directed             Medication List    STOP taking these medications       phentermine 37.5 MG capsule      TAKE these medications       cephALEXin 500 MG capsule  Commonly known as:  KEFLEX  Take 1 capsule (500 mg total) by mouth 4 (four) times daily.     GREEN COFFEE BEAN PO  Take 2,400 mg by mouth 2 (two) times daily.     ketorolac 10 MG tablet  Commonly known as:  TORADOL  Take 1 tablet (10 mg total) by mouth every 6 (six) hours as needed.     nicotine 21 mg/24hr patch  Commonly known as:  NICODERM CQ  Place 1 patch (21 mg total) onto the skin daily.     oxyCODONE-acetaminophen 5-325 MG per tablet  Commonly known as:  PERCOCET/ROXICET  Take 1-2 tablets by mouth every 4 (four) hours as needed for severe pain (moderate to severe pain (when tolerating fluids)).           Follow-up Information   Follow up with [name], MD In 1 week. (As needed, If symptoms worsen, For wound re-check)    Specialties:  Obstetrics and Gynecology, Radiology   Contact information:   [place] [address] [phone]       Signed: [name] 03/02/2014, 10:43 AM

## 2014-03-08 ENCOUNTER — Ambulatory Visit (INDEPENDENT_AMBULATORY_CARE_PROVIDER_SITE_OTHER): Payer: 59 | Admitting: Obstetrics and Gynecology

## 2014-03-08 ENCOUNTER — Encounter: Payer: Self-pay | Admitting: Obstetrics and Gynecology

## 2014-03-08 VITALS — BP 102/60 | Wt 165.0 lb

## 2014-03-08 DIAGNOSIS — Z9889 Other specified postprocedural states: Secondary | ICD-10-CM

## 2014-03-08 DIAGNOSIS — Z09 Encounter for follow-up examination after completed treatment for conditions other than malignant neoplasm: Secondary | ICD-10-CM

## 2014-03-08 MED ORDER — HYDROCHLOROTHIAZIDE 25 MG PO TABS: 25.0000 mg | ORAL_TABLET | Freq: Every day | ORAL | Status: DC

## 2014-03-08 NOTE — Addendum Note (Signed)
Addended by: [name] on: 03/08/2014 12:22 PM   Modules accepted: Orders

## 2014-03-08 NOTE — Progress Notes (Signed)
Patient ID: JANAN BOGIE, female   DOB: 09/21/1966, 48 y.o.   MRN: [id number]    Subjective:  Maria Hester is a 48 y.o. female who presents to the clinic 1 weeks status post supracervical hysterectomy and panniculectomy and drain removal.    Review of Systems Negative except had lots of gas after surgery  She has been eating a regular diet without difficulty.   Bowel movements are normal. The patient is not having any pain.  Objective:  BP 102/60  Wt 165 lb (74.844 kg)  LMP 02/11/2014 General:Well developed, well nourished.  No acute distress. Abdomen: Bowel sounds normal, soft, non-tender. Incision(s):   Healing well, no drainage, no erythema, no hernia, no swelling, no dehiscence, incision well approximated.drain removed used toradol no vicodin   Assessment:  Post-Op 1 weeks s/p supracervical hysterectomy    Doing well postoperatively.   Plan:  1.Wound care discussed   2. .Continue any current medications. 3. Activity restrictions: no lifting more than 15 pounds 4. return to work: 6. 5. Follow up in 4 weeks.

## 2014-04-05 ENCOUNTER — Encounter: Payer: Self-pay | Admitting: Obstetrics and Gynecology

## 2014-04-05 ENCOUNTER — Ambulatory Visit (INDEPENDENT_AMBULATORY_CARE_PROVIDER_SITE_OTHER): Payer: 59 | Admitting: Obstetrics and Gynecology

## 2014-04-05 VITALS — BP 94/52 | Ht 65.5 in | Wt 158.0 lb

## 2014-04-05 DIAGNOSIS — Z9889 Other specified postprocedural states: Secondary | ICD-10-CM

## 2014-04-05 NOTE — Progress Notes (Addendum)
Note corrected to be assumed and completed by [email or web address]

## 2014-04-05 NOTE — Patient Instructions (Signed)
Return to work on July 12th, 2015.

## 2014-04-07 NOTE — Progress Notes (Signed)
Patient ID: Maria Hester, female   DOB: 11-03-1965, 48 y.o.   MRN: [id number] This chart was scribed by Ludger Nutting, Medical Scribe, for Dr. Mallory Shirk on 04/05/14 at 11:19 AM. This chart was reviewed by Dr. Mallory Shirk for accuracy.    Subjective:  Maria Hester is a 48 y.o. female who presents to the clinic 5 weeks status post supracervical abdominal hysterectomy, bilateral salpingectomy, scar revision, and abdominoplasty with panniculectomy.   Review of Systems Negative except pt states that she  had some bleeding that started on the 17th of this month and went through the 20th, had to wear panty liner, still has some pinkish when she wipes at times.   She has been eating a regular diet without difficulty.   Bowel movements are normal. The patient is not having any pain.  Objective:  BP 94/52  Ht 5' 5.5" (1.664 m)  Wt 158 lb (71.668 kg)  BMI 25.88 kg/m2  LMP 02/11/2014 General:Well developed, well nourished.  No acute distress. Abdomen: Bowel sounds normal, soft, non-tender. Well healed abd incision Pelvic Exam:    External Genitalia:  Normal.    Vagina: Normal    Bimanual: Normal    Cervix: Normal    Adnexa: Normal  Incision(s):   Healing well, no drainage, no erythema, no hernia, no swelling, no dehiscence, incision well approximated.   Assessment:  Post-Op 5 weeks s/p supracervical abdominal hysterectomy, bilateral salpingectomy, scar revision, and abdominoplasty with panniculectomy.   Doing well postoperatively.   Plan:  1.Wound care discussed   2. Continue any current medications. 3. Activity restrictions: use lighter weights at the gym 4. return to work: July 12th 5. Follow up PRN

## 2014-06-06 ENCOUNTER — Other Ambulatory Visit: Payer: Self-pay | Admitting: Obstetrics and Gynecology

## 2014-06-06 DIAGNOSIS — IMO0002 Reserved for concepts with insufficient information to code with codable children: Secondary | ICD-10-CM

## 2014-06-06 DIAGNOSIS — R229 Localized swelling, mass and lump, unspecified: Principal | ICD-10-CM

## 2014-07-11 ENCOUNTER — Ambulatory Visit (HOSPITAL_COMMUNITY)
Admission: RE | Admit: 2014-07-11 | Discharge: 2014-07-11 | Disposition: A | Discharge status: Home / Self Care | Payer: 59 | Source: Ambulatory Visit | Attending: Obstetrics and Gynecology | Admitting: Obstetrics and Gynecology

## 2014-07-11 DIAGNOSIS — R229 Localized swelling, mass and lump, unspecified: Secondary | ICD-10-CM | POA: Insufficient documentation

## 2014-07-11 DIAGNOSIS — IMO0002 Reserved for concepts with insufficient information to code with codable children: Secondary | ICD-10-CM

## 2015-02-01 ENCOUNTER — Other Ambulatory Visit: Payer: Self-pay | Admitting: Obstetrics and Gynecology

## 2015-02-01 DIAGNOSIS — R921 Mammographic calcification found on diagnostic imaging of breast: Secondary | ICD-10-CM

## 2015-02-01 DIAGNOSIS — Z09 Encounter for follow-up examination after completed treatment for conditions other than malignant neoplasm: Secondary | ICD-10-CM

## 2015-02-19 ENCOUNTER — Encounter: Payer: Self-pay | Admitting: Obstetrics and Gynecology

## 2015-02-19 ENCOUNTER — Ambulatory Visit (INDEPENDENT_AMBULATORY_CARE_PROVIDER_SITE_OTHER): Payer: 59 | Admitting: Obstetrics and Gynecology

## 2015-02-19 ENCOUNTER — Other Ambulatory Visit: Payer: Self-pay | Admitting: Obstetrics and Gynecology

## 2015-02-19 VITALS — BP 118/70 | Ht 65.5 in | Wt 169.0 lb

## 2015-02-19 DIAGNOSIS — Z01419 Encounter for gynecological examination (general) (routine) without abnormal findings: Secondary | ICD-10-CM

## 2015-02-19 DIAGNOSIS — Z0000 Encounter for general adult medical examination without abnormal findings: Secondary | ICD-10-CM

## 2015-02-19 DIAGNOSIS — Z09 Encounter for follow-up examination after completed treatment for conditions other than malignant neoplasm: Secondary | ICD-10-CM

## 2015-02-19 NOTE — Progress Notes (Signed)
Patient ID: Maria Hester, female   DOB: 04/03/1966, 49 y.o.   MRN: [id number] Pt here today for annual exam. Pt denies any problems or concerns at this time. Pt has had a hysterectomy!

## 2015-02-19 NOTE — Progress Notes (Signed)
Patient ID: Maria Hester, female   DOB: 1965/12/04, 49 y.o.   MRN: [id number]   Assessment:  Annual Gyn Exam   Plan:  1. pap smear done, next pap due 2 years 2. return annually or prn 3    Annual mammogram advised Subjective:  Maria Hester is a 49 y.o. female s/p supracervical abdominal hysterectomy and bilateral salpingectomy. No obstetric history on file. who presents for annual exam. Patient's last menstrual period was 02/11/2014. The patient does not have any complaints today. She reports positive results following her hysterectomy 1 year ago. Pt exercises regularly and reports 10 lb weight gain, likely muscular, after she started going to the gym. Pt's PCP recently took her off Phentermine.   The following portions of the patient's history were reviewed and updated as appropriate: allergies, current medications, past family history, past medical history, past social history, past surgical history and problem list. Past Medical History  Diagnosis Date  . Uterine fibroid   . Complication of anesthesia     pt states BP tends to drop and has a hard time waking up - pt was told by prior anesthesiologist to make sure this was made known for furture surgeries    Past Surgical History  Procedure Laterality Date  . Wisdom tooth extraction    . Appendectomy    . Tubal ligation    . Reverse tubal ligation    . Cataract extraction, bilateral    . Supracervical abdominal hysterectomy N/A 02/28/2014    Procedure: HYSTERECTOMY SUPRACERVICAL ABDOMINAL;  Surgeon: [name], MD;  Location: [facility];  Service: Gynecology;  Laterality: N/A;  . Bilateral salpingectomy Bilateral 02/28/2014    Procedure: BILATERAL SALPINGECTOMY;  Surgeon: [name], MD;  Location: [facility];  Service: Gynecology;  Laterality: Bilateral;  . Scar revision N/A 02/28/2014    Procedure: SCAR REVISION;  Surgeon: [name], MD;  Location: [facility];  Service: Gynecology;  Laterality: N/A;  . Abdominoplasty N/A  02/28/2014    Procedure: ABDOMINOPLASTY with Panniculectomy;  Surgeon: [name], MD;  Location: [facility];  Service: Gynecology;  Laterality: N/A;  . Abdominal hysterectomy      No current outpatient prescriptions on file.  Review of Systems Constitutional: negative Gastrointestinal: negative Genitourinary: negative  Objective:  BP 118/70 mmHg  Ht 5' 5.5" (1.664 m)  Wt 169 lb (76.658 kg)  BMI 27.69 kg/m2  LMP 02/11/2014   BMI: Body mass index is 27.69 kg/(m^2).  General Appearance: Alert, appropriate appearance for age. No acute distress HEENT: Grossly normal Neck / Thyroid:  Cardiovascular: RRR; normal S1, S2, no murmur Lungs: CTA bilaterally Back: No CVAT Breast Exam: No masses or nodes.No dimpling, nipple retraction or discharge. Increased grandular tissue on the right side, mobile. Gastrointestinal: Soft, non-tender, no masses or organomegaly Pelvic Exam: Vulva and vagina appear normal. External genitalia: normal general appearance Vaginal: normal mucosa without prolapse or lesions Cervix: normal appearing; normal secretions Adnexa: surgically absent Uterus: surgically absent Rectovaginal: not indicated and guaiac negative stool obtained Lymphatic Exam: Non-palpable nodes in neck, clavicular, axillary, or inguinal regions  Skin: no rash or abnormalities Neurologic: Normal gait and speech, no tremor  Psychiatric: Alert and oriented, appropriate affect.  Urinalysis:Not done  Hemoccult: Negative  Mallory Shirk. MD Pgr [phone] 10:11 AM   This chart was scribed for [name], MD by Tula Nakayama, ED Scribe. This patient was seen in room 3 and the patient's care was started at 10:11 AM.   I personally performed  the services described in this documentation, which was SCRIBED in my presence. The recorded information has been reviewed and considered accurate. It has been edited as necessary during review. [name], MD

## 2015-03-13 ENCOUNTER — Ambulatory Visit (HOSPITAL_COMMUNITY)
Admission: RE | Admit: 2015-03-13 | Discharge: 2015-03-13 | Disposition: A | Discharge status: Home / Self Care | Payer: 59 | Source: Ambulatory Visit | Attending: Obstetrics and Gynecology | Admitting: Obstetrics and Gynecology

## 2015-03-13 ENCOUNTER — Other Ambulatory Visit: Payer: Self-pay | Admitting: Obstetrics and Gynecology

## 2015-03-13 DIAGNOSIS — R928 Other abnormal and inconclusive findings on diagnostic imaging of breast: Secondary | ICD-10-CM | POA: Insufficient documentation

## 2015-03-13 DIAGNOSIS — Z09 Encounter for follow-up examination after completed treatment for conditions other than malignant neoplasm: Secondary | ICD-10-CM

## 2015-12-18 ENCOUNTER — Ambulatory Visit: Payer: 59 | Admitting: "Endocrinology

## 2015-12-25 ENCOUNTER — Encounter: Payer: Self-pay | Admitting: "Endocrinology

## 2015-12-25 ENCOUNTER — Ambulatory Visit (INDEPENDENT_AMBULATORY_CARE_PROVIDER_SITE_OTHER): Payer: 59 | Admitting: "Endocrinology

## 2015-12-25 VITALS — BP 102/68 | HR 72 | Ht 65.5 in | Wt 180.0 lb

## 2015-12-25 DIAGNOSIS — E042 Nontoxic multinodular goiter: Secondary | ICD-10-CM

## 2015-12-25 DIAGNOSIS — E785 Hyperlipidemia, unspecified: Secondary | ICD-10-CM | POA: Diagnosis not present

## 2015-12-25 NOTE — Progress Notes (Signed)
Subjective:    Patient ID: Maria Hester, female    DOB: July 10, 1966, PCP [name], MD   Past Medical History  Diagnosis Date  . Uterine fibroid   . Complication of anesthesia     pt states BP tends to drop and has a hard time waking up - pt was told by prior anesthesiologist to make sure this was made known for furture surgeries   Past Surgical History  Procedure Laterality Date  . Wisdom tooth extraction    . Appendectomy    . Tubal ligation    . Reverse tubal ligation    . Cataract extraction, bilateral    . Supracervical abdominal hysterectomy N/A 02/28/2014    Procedure: HYSTERECTOMY SUPRACERVICAL ABDOMINAL;  Surgeon: [name], MD;  Location: [facility];  Service: Gynecology;  Laterality: N/A;  . Bilateral salpingectomy Bilateral 02/28/2014    Procedure: BILATERAL SALPINGECTOMY;  Surgeon: [name], MD;  Location: [facility];  Service: Gynecology;  Laterality: Bilateral;  . Scar revision N/A 02/28/2014    Procedure: SCAR REVISION;  Surgeon: [name], MD;  Location: [facility];  Service: Gynecology;  Laterality: N/A;  . Abdominoplasty N/A 02/28/2014    Procedure: ABDOMINOPLASTY with Panniculectomy;  Surgeon: [name], MD;  Location: [facility];  Service: Gynecology;  Laterality: N/A;  . Abdominal hysterectomy     Social History   Social History  . Marital Status: Single    Spouse Name: N/A  . Number of Children: N/A  . Years of Education: N/A   Social History Main Topics  . Smoking status: Current Every Day Smoker -- 0.50 packs/day for 32 years    Types: Cigarettes  . Smokeless tobacco: Never Used  . Alcohol Use: Yes     Comment: social drink   . Drug Use: No  . Sexual Activity: Yes    Birth Control/ Protection: Surgical   Other Topics Concern  . None   Social History Narrative   No outpatient encounter prescriptions on file as of 12/25/2015.   No facility-administered encounter medications on file as of 12/25/2015.   ALLERGIES: No Known  Allergies VACCINATION STATUS:  There is no immunization history on file for this patient.  HPI 50 year old female patient with medical history as above. She is being seen in consultation for multinodular goiter requested by Tommie Sams, M.D. She was found to have clinical goiter during her and were physical which was followed up by dedicated thyroid ultrasound on 11/29/2015. This study showed bilateral enlargement of thyroid with no members nodules bilaterally the largest once 1.4; on the right lobe and 1.1 cm on the left. She denies dysphagia, shortness of breath, nor voice change. She denies any exposure to neck radiation. She denies family history of thyroid cancer. She is not on any antithyroid medications nor thyroid hormones. She is a chronic smoker. She has hyper lipidemia not on treatment on 6 months lifestyle modification.  Review of Systems Constitutional: no weight gain/loss, no fatigue, no subjective hyperthermia/hypothermia Eyes: no blurry vision, no xerophthalmia ENT: no sore throat, no nodules palpated in throat, no dysphagia/odynophagia, no hoarseness Cardiovascular: no CP/SOB/palpitations/leg swelling Respiratory: no cough/SOB Gastrointestinal: no N/V/D/C Musculoskeletal: no muscle/joint aches Skin: no rashes Neurological: no tremors/numbness/tingling/dizziness Psychiatric: no depression/anxiety  Objective:    BP 102/68 mmHg  Pulse 72  Ht 5' 5.5" (1.664 m)  Wt 180 lb (81.647 kg)  BMI 29.49 kg/m2  SpO2 97%  LMP 02/11/2014  Wt Readings from Last 3 Encounters:  12/25/15  180 lb (81.647 kg)  02/19/15 169 lb (76.658 kg)  04/05/14 158 lb (71.668 kg)    Physical Exam Constitutional: overweight, in NAD Eyes: PERRLA, EOMI, no exophthalmos ENT: moist mucous membranes, + thyromegaly, no cervical lymphadenopathy Cardiovascular: RRR, No MRG Respiratory: CTA B Gastrointestinal: abdomen soft, NT, ND, BS+ Musculoskeletal: no deformities, strength intact in all 4 Skin:  moist, warm, no rashes Neurological:  mild tremor with outstretched hands, DTR normal in all 4  CMP ( most recent) CMP     Component Value Date/Time   NA 140 03/01/2014 0607   K 4.6 03/01/2014 0607   CL 106 03/01/2014 0607   CO2 25 03/01/2014 0607   GLUCOSE 85 03/01/2014 0607   BUN 10 03/01/2014 0607   CREATININE 0.60 03/01/2014 0607   CREATININE 0.67 12/19/2013 1218   CALCIUM 8.0* 03/01/2014 0607   PROT 6.7 12/19/2013 1218   ALBUMIN 4.3 12/19/2013 1218   AST 15 12/19/2013 1218   ALT 12 12/19/2013 1218   ALKPHOS 59 12/19/2013 1218   BILITOT 0.4 12/19/2013 1218   GFRNONAA >90 03/01/2014 0607   GFRAA >90 03/01/2014 0607    Lipid Panel ( most recent) Lipid Panel     Component Value Date/Time   CHOL 266* 12/19/2013 1218   TRIG 179* 12/19/2013 1218   HDL 48 12/19/2013 1218   CHOLHDL 5.5 12/19/2013 1218   VLDL 36 12/19/2013 1218   LDLCALC 182* 12/19/2013 1218     Thyroid ultrasound is a worry 16 2017: Right lobe 5.3 x 2.1 x 1.7 cm diffusely heterogeneous with nightmares nodules. Largest one 1.47 m. Left lobe: 5.5 x 1.5 x 1.9 cm with diffuse heterogeneity and nightmares nodules. Largest one 1.1 cm. Complete report to be scanned into her records.    Assessment & Plan:   1. Nontoxic multinodular goiter -She is clinically euthyroid, clinically and biochemicaly. Given her history of chronic smoking, she will need further study to rule out malignancy and her thyroid. I approached her for fine needle aspiration and she agrees. We will arrange this to be done at [place] where she had her thyroid ultrasound.    2. Hyperlipidemia -Her LDL is 171 being followed by her cardiologist Dr. Tommie Sams. She is on 6 months trial. 4 lifestyle modification. She has family history of premature coronary artery disease in her father at age 41. I have counseled her to quit smoking immediately to minimize her risk of coronary artery disease.   - I advised patient to  maintain close follow up with [name], MD for primary care needs. Follow up plan: Return in about 3 weeks (around 01/15/2016) for biopsy results.  Glade Lloyd, MD Phone: [phone]  Fax: [phone]   12/25/2015, 11:34 AM

## 2016-01-17 ENCOUNTER — Ambulatory Visit: Payer: 59 | Admitting: "Endocrinology

## 2016-02-12 ENCOUNTER — Encounter: Payer: Self-pay | Admitting: "Endocrinology

## 2016-02-20 ENCOUNTER — Encounter: Payer: Self-pay | Admitting: "Endocrinology

## 2016-02-20 ENCOUNTER — Ambulatory Visit (INDEPENDENT_AMBULATORY_CARE_PROVIDER_SITE_OTHER): Payer: 59 | Admitting: "Endocrinology

## 2016-02-20 VITALS — BP 119/78 | HR 70 | Ht 65.5 in | Wt 180.0 lb

## 2016-02-20 DIAGNOSIS — E785 Hyperlipidemia, unspecified: Secondary | ICD-10-CM | POA: Diagnosis not present

## 2016-02-20 DIAGNOSIS — E042 Nontoxic multinodular goiter: Secondary | ICD-10-CM | POA: Diagnosis not present

## 2016-02-20 NOTE — Progress Notes (Signed)
Subjective:    Patient ID: Maria Hester, female    DOB: Nov 29, 1965, PCP [name], MD   Past Medical History  Diagnosis Date  . Uterine fibroid   . Complication of anesthesia     pt states BP tends to drop and has a hard time waking up - pt was told by prior anesthesiologist to make sure this was made known for furture surgeries   Past Surgical History  Procedure Laterality Date  . Wisdom tooth extraction    . Appendectomy    . Tubal ligation    . Reverse tubal ligation    . Cataract extraction, bilateral    . Supracervical abdominal hysterectomy N/A 02/28/2014    Procedure: HYSTERECTOMY SUPRACERVICAL ABDOMINAL;  Surgeon: [name], MD;  Location: [facility];  Service: Gynecology;  Laterality: N/A;  . Bilateral salpingectomy Bilateral 02/28/2014    Procedure: BILATERAL SALPINGECTOMY;  Surgeon: [name], MD;  Location: [facility];  Service: Gynecology;  Laterality: Bilateral;  . Scar revision N/A 02/28/2014    Procedure: SCAR REVISION;  Surgeon: [name], MD;  Location: [facility];  Service: Gynecology;  Laterality: N/A;  . Abdominoplasty N/A 02/28/2014    Procedure: ABDOMINOPLASTY with Panniculectomy;  Surgeon: [name], MD;  Location: [facility];  Service: Gynecology;  Laterality: N/A;  . Abdominal hysterectomy     Social History   Social History  . Marital Status: Single    Spouse Name: N/A  . Number of Children: N/A  . Years of Education: N/A   Social History Main Topics  . Smoking status: Current Every Day Smoker -- 0.50 packs/day for 32 years    Types: Cigarettes  . Smokeless tobacco: Never Used  . Alcohol Use: Yes     Comment: social drink   . Drug Use: No  . Sexual Activity: Yes    Birth Control/ Protection: Surgical   Other Topics Concern  . None   Social History Narrative   No outpatient encounter prescriptions on file as of 02/20/2016.   No facility-administered encounter medications on file as of 02/20/2016.   ALLERGIES: No Known  Allergies VACCINATION STATUS:  There is no immunization history on file for this patient.  HPI 50 year old female patient with medical history as above. She is being seen in follow-up for multinodular goiter status post fine needle aspiration reported as benign.  She she denies any new complaint.  She denies dysphagia, shortness of breath, nor voice change. She denies any exposure to neck radiation. She denies family history of thyroid cancer. She is not on any antithyroid medications nor thyroid hormones. She is a chronic smoker. She has hyperlipidemia not on treatment on 6 months  trial of lifestyle modification.  Review of Systems Constitutional: no weight gain/loss, no fatigue, no subjective hyperthermia/hypothermia Eyes: no blurry vision, no xerophthalmia ENT: no sore throat, no nodules palpated in throat, no dysphagia/odynophagia, no hoarseness Cardiovascular: no CP/SOB/palpitations/leg swelling Respiratory: no cough/SOB Gastrointestinal: no N/V/D/C Musculoskeletal: no muscle/joint aches Skin: no rashes Neurological: no tremors/numbness/tingling/dizziness Psychiatric: no depression/anxiety  Objective:    BP 119/78 mmHg  Pulse 70  Ht 5' 5.5" (1.664 m)  Wt 180 lb (81.647 kg)  BMI 29.49 kg/m2  SpO2 98%  LMP 02/11/2014  Wt Readings from Last 3 Encounters:  02/20/16 180 lb (81.647 kg)  12/25/15 180 lb (81.647 kg)  02/19/15 169 lb (76.658 kg)    Physical Exam Constitutional: overweight, in NAD Eyes: PERRLA, EOMI, no exophthalmos ENT: moist mucous membranes, +  thyromegaly, no cervical lymphadenopathy Cardiovascular: RRR, No MRG Respiratory: CTA B Gastrointestinal: abdomen soft, NT, ND, BS+ Musculoskeletal: no deformities, strength intact in all 4 Skin: moist, warm, no rashes Neurological:  mild tremor with outstretched hands, DTR normal in all 4  CMP ( most recent) CMP     Component Value Date/Time   NA 140 03/01/2014 0607   K 4.6 03/01/2014 0607   CL 106  03/01/2014 0607   CO2 25 03/01/2014 0607   GLUCOSE 85 03/01/2014 0607   BUN 10 03/01/2014 0607   CREATININE 0.60 03/01/2014 0607   CREATININE 0.67 12/19/2013 1218   CALCIUM 8.0* 03/01/2014 0607   PROT 6.7 12/19/2013 1218   ALBUMIN 4.3 12/19/2013 1218   AST 15 12/19/2013 1218   ALT 12 12/19/2013 1218   ALKPHOS 59 12/19/2013 1218   BILITOT 0.4 12/19/2013 1218   GFRNONAA >90 03/01/2014 0607   GFRAA >90 03/01/2014 0607    Lipid Panel ( most recent) Lipid Panel     Component Value Date/Time   CHOL 266* 12/19/2013 1218   TRIG 179* 12/19/2013 1218   HDL 48 12/19/2013 1218   CHOLHDL 5.5 12/19/2013 1218   VLDL 36 12/19/2013 1218   LDLCALC 182* 12/19/2013 1218   -Fine-needle aspiration from 01/30/2016 was benign.     Assessment & Plan:   1. Nontoxic multinodular goiter -She is  euthyroid, clinically and biochemicaly. Her fine-needle aspiration is negative for malignancy. She would need thyroid function test in a year with office visit. No intervention required at this time.  2. Hyperlipidemia -Her LDL is 171 being followed by her cardiologist Dr. Tommie Sams. She is on 6 months trial. 4 lifestyle modification. She has family history of premature coronary artery disease in her father at age 81. I have counseled her to quit smoking immediately to minimize her risk of coronary artery disease.   - I advised patient to maintain close follow up with [name], MD for Her OB/GYN care needs and Dr. Rosana Hoes for her primary care needs. Follow up plan: Return in about 1 year (around 02/19/2017) for follow up with pre-visit labs.  Glade Lloyd, MD Phone: [phone]  Fax: [phone]   02/20/2016, 11:57 AM

## 2016-10-13 DIAGNOSIS — C50919 Malignant neoplasm of unspecified site of unspecified female breast: Secondary | ICD-10-CM

## 2016-10-13 HISTORY — DX: Malignant neoplasm of unspecified site of unspecified female breast: C50.919

## 2016-10-13 HISTORY — PX: BREAST LUMPECTOMY: SHX2

## 2017-02-16 ENCOUNTER — Telehealth: Payer: Self-pay | Admitting: Adult Health

## 2017-02-16 ENCOUNTER — Other Ambulatory Visit: Payer: Self-pay | Admitting: *Deleted

## 2017-02-16 ENCOUNTER — Other Ambulatory Visit: Payer: Self-pay | Admitting: Adult Health

## 2017-02-16 DIAGNOSIS — Z1231 Encounter for screening mammogram for malignant neoplasm of breast: Secondary | ICD-10-CM

## 2017-02-16 NOTE — Progress Notes (Signed)
Xray says she needs screening mammogram not diagnostic, so she can call and schedule her self

## 2017-02-16 NOTE — Telephone Encounter (Signed)
Left message she needs screening not diagnostic mammogram,so she can call for appt

## 2017-02-18 ENCOUNTER — Encounter: Payer: Self-pay | Admitting: Adult Health

## 2017-02-18 ENCOUNTER — Ambulatory Visit (INDEPENDENT_AMBULATORY_CARE_PROVIDER_SITE_OTHER): Payer: 59 | Admitting: Adult Health

## 2017-02-18 VITALS — BP 108/70 | HR 80 | Ht 65.2 in | Wt 195.0 lb

## 2017-02-18 DIAGNOSIS — N6322 Unspecified lump in the left breast, upper inner quadrant: Secondary | ICD-10-CM

## 2017-02-18 NOTE — Patient Instructions (Signed)
Menopause Menopause is the normal time of life when menstrual periods stop completely. Menopause is complete when you have missed 12 consecutive menstrual periods. It usually occurs between the ages of 48 years and 55 years. Very rarely does a woman develop menopause before the age of 40 years. At menopause, your ovaries stop producing the female hormones estrogen and progesterone. This can cause undesirable symptoms and also affect your health. Sometimes the symptoms may occur 4-5 years before the menopause begins. There is no relationship between menopause and:  Oral contraceptives.  Number of children you had.  Race.  The age your menstrual periods started (menarche).  Heavy smokers and very thin women may develop menopause earlier in life. What are the causes?  The ovaries stop producing the female hormones estrogen and progesterone. Other causes include:  Surgery to remove both ovaries.  The ovaries stop functioning for no known reason.  Tumors of the pituitary gland in the brain.  Medical disease that affects the ovaries and hormone production.  Radiation treatment to the abdomen or pelvis.  Chemotherapy that affects the ovaries.  What are the signs or symptoms?  Hot flashes.  Night sweats.  Decrease in sex drive.  Vaginal dryness and thinning of the vagina causing painful intercourse.  Dryness of the skin and developing wrinkles.  Headaches.  Tiredness.  Irritability.  Memory problems.  Weight gain.  Bladder infections.  Hair growth of the face and chest.  Infertility. More serious symptoms include:  Loss of bone (osteoporosis) causing breaks (fractures).  Depression.  Hardening and narrowing of the arteries (atherosclerosis) causing heart attacks and strokes.  How is this diagnosed?  When the menstrual periods have stopped for 12 straight months.  Physical exam.  Hormone studies of the blood. How is this treated? There are many treatment  choices and nearly as many questions about them. The decisions to treat or not to treat menopausal changes is an individual choice made with your health care provider. Your health care provider can discuss the treatments with you. Together, you can decide which treatment will work best for you. Your treatment choices may include:  Hormone therapy (estrogen and progesterone).  Non-hormonal medicines.  Treating the individual symptoms with medicine (for example antidepressants for depression).  Herbal medicines that may help specific symptoms.  Counseling by a psychiatrist or psychologist.  Group therapy.  Lifestyle changes including: ? Eating healthy. ? Regular exercise. ? Limiting caffeine and alcohol. ? Stress management and meditation.  No treatment.  Follow these instructions at home:  Take the medicine your health care provider gives you as directed.  Get plenty of sleep and rest.  Exercise regularly.  Eat a diet that contains calcium (good for the bones) and soy products (acts like estrogen hormone).  Avoid alcoholic beverages.  Do not smoke.  If you have hot flashes, dress in layers.  Take supplements, calcium, and vitamin D to strengthen bones.  You can use over-the-counter lubricants or moisturizers for vaginal dryness.  Group therapy is sometimes very helpful.  Acupuncture may be helpful in some cases. Contact a health care provider if:  You are not sure you are in menopause.  You are having menopausal symptoms and need advice and treatment.  You are still having menstrual periods after age 55 years.  You have pain with intercourse.  Menopause is complete (no menstrual period for 12 months) and you develop vaginal bleeding.  You need a referral to a specialist (gynecologist, psychiatrist, or psychologist) for treatment. Get help right   away if:  You have severe depression.  You have excessive vaginal bleeding.  You fell and think you have a  broken bone.  You have pain when you urinate.  You develop leg or chest pain.  You have a fast pounding heart beat (palpitations).  You have severe headaches.  You develop vision problems.  You feel a lump in your breast.  You have abdominal pain or severe indigestion. This information is not intended to replace advice given to you by your health care provider. Make sure you discuss any questions you have with your health care provider. Document Released: 12/20/2003 Document Revised: 03/06/2016 Document Reviewed: 04/28/2013 Elsevier Interactive Patient Education  2017 [employer]vier [employer].  

## 2017-02-18 NOTE — Progress Notes (Signed)
Subjective:     Patient ID: Maria Hester, female   DOB: July 26, 1966, 51 y.o.   MRN: [id number]  HPI Maria Hester is a 51 year old white female in after finding breast lump Saturday in left breast.She also has night sweats and feels down at times. She is sp [facility] and last mammogram was in 2016. She works 2 jobs and is tired at times.  Review of Systems +breast mass +night sweats Tired  Feels down Reviewed past medical,surgical, social and family history. Reviewed medications and allergies.     Objective:   Physical Exam BP 108/70   Pulse 80   Ht 5' 5.2" (1.656 m)   Wt 195 lb (88.5 kg)   LMP 02/11/2014   BMI 32.25 kg/m   Skin warm and dry,  Breasts:no dominate palpable mass, retraction or nipple discharge on right, on left no retraction or nipple discharge but has 7 cm mass/firm thickness at 11 o'clock, it is non mobile and tender.   PHQ 9 score 8, denies depression,but feels down, denies any suicidal or homicidal ideations, has xanax if needs something, declines meds or counseling. Discussed hot flashes, night sweats and other menopause symptoms, she is trying OTC Amberen.Will get diagnostic mammogram and [place] at [facility].  Face time 15 minutes with 50% counseling and coordinating care.  Assessment:     1. Mass of upper inner quadrant of left breast       Plan:     Diagnostic bilateral mammogram and left and right [place] 02/24/17 at 9 am at [facility] Return in 2 weeks for pap and physical Review handout on menopause

## 2017-02-19 ENCOUNTER — Ambulatory Visit: Payer: 59 | Admitting: "Endocrinology

## 2017-02-24 ENCOUNTER — Ambulatory Visit (HOSPITAL_COMMUNITY)
Admission: RE | Admit: 2017-02-24 | Discharge: 2017-02-24 | Disposition: A | Discharge status: Home / Self Care | Payer: 59 | Source: Ambulatory Visit | Attending: Adult Health | Admitting: Adult Health

## 2017-02-24 ENCOUNTER — Telehealth: Payer: Self-pay | Admitting: Adult Health

## 2017-02-24 ENCOUNTER — Other Ambulatory Visit: Payer: Self-pay | Admitting: Adult Health

## 2017-02-24 DIAGNOSIS — R590 Localized enlarged lymph nodes: Secondary | ICD-10-CM | POA: Diagnosis not present

## 2017-02-24 DIAGNOSIS — R921 Mammographic calcification found on diagnostic imaging of breast: Secondary | ICD-10-CM | POA: Insufficient documentation

## 2017-02-24 DIAGNOSIS — N6322 Unspecified lump in the left breast, upper inner quadrant: Secondary | ICD-10-CM | POA: Insufficient documentation

## 2017-02-24 DIAGNOSIS — N6320 Unspecified lump in the left breast, unspecified quadrant: Secondary | ICD-10-CM

## 2017-02-24 NOTE — Telephone Encounter (Signed)
Called pt to offer my support, and let me known when her biopsy is scheduled, will see next week at pap and physical appt.

## 2017-03-02 ENCOUNTER — Ambulatory Visit
Admission: RE | Admit: 2017-03-02 | Discharge: 2017-03-02 | Disposition: A | Discharge status: Home / Self Care | Payer: 59 | Source: Ambulatory Visit | Attending: Adult Health | Admitting: Adult Health

## 2017-03-02 ENCOUNTER — Other Ambulatory Visit: Payer: Self-pay | Admitting: Adult Health

## 2017-03-02 ENCOUNTER — Ambulatory Visit
Admission: RE | Admit: 2017-03-02 | Discharge: 2017-03-02 | Disposition: A | Discharge status: Home / Self Care | Payer: PRIVATE HEALTH INSURANCE | Source: Ambulatory Visit | Attending: Adult Health | Admitting: Adult Health

## 2017-03-02 DIAGNOSIS — N6320 Unspecified lump in the left breast, unspecified quadrant: Secondary | ICD-10-CM

## 2017-03-02 DIAGNOSIS — R921 Mammographic calcification found on diagnostic imaging of breast: Secondary | ICD-10-CM

## 2017-03-04 ENCOUNTER — Encounter: Payer: Self-pay | Admitting: Adult Health

## 2017-03-04 ENCOUNTER — Other Ambulatory Visit (HOSPITAL_COMMUNITY)
Admission: RE | Admit: 2017-03-04 | Discharge: 2017-03-04 | Disposition: A | Discharge status: Home / Self Care | Payer: 59 | Source: Ambulatory Visit | Attending: Adult Health | Admitting: Adult Health

## 2017-03-04 ENCOUNTER — Ambulatory Visit (INDEPENDENT_AMBULATORY_CARE_PROVIDER_SITE_OTHER): Payer: 59 | Admitting: Adult Health

## 2017-03-04 VITALS — BP 88/52 | HR 76 | Ht 64.5 in | Wt 188.0 lb

## 2017-03-04 DIAGNOSIS — Z1212 Encounter for screening for malignant neoplasm of rectum: Secondary | ICD-10-CM

## 2017-03-04 DIAGNOSIS — Z01419 Encounter for gynecological examination (general) (routine) without abnormal findings: Secondary | ICD-10-CM | POA: Insufficient documentation

## 2017-03-04 DIAGNOSIS — C50919 Malignant neoplasm of unspecified site of unspecified female breast: Secondary | ICD-10-CM | POA: Insufficient documentation

## 2017-03-04 DIAGNOSIS — C50412 Malignant neoplasm of upper-outer quadrant of left female breast: Secondary | ICD-10-CM | POA: Insufficient documentation

## 2017-03-04 DIAGNOSIS — Z1211 Encounter for screening for malignant neoplasm of colon: Secondary | ICD-10-CM | POA: Diagnosis not present

## 2017-03-04 LAB — HEMOCCULT GUIAC POC 1CARD (OFFICE): Fecal Occult Blood, POC: NEGATIVE

## 2017-03-04 NOTE — Progress Notes (Signed)
History of Present Illness:  Maria Hester is a 51 year old white female, married,in for well woman gyn exam and pap.Recently diagnosed with breast cancer left breast.She had [facility].   Current Medications, Allergies, Past Medical History, Past Surgical History, Family History and Social History were reviewed in [employer] record.     Review of Systems: Patient denies any headaches, hearing loss, fatigue, blurred vision, shortness of breath, chest pain, abdominal pain, problems with bowel movements, urination, or intercourse. No joint pain or mood swings.    Physical Exam:BP (!) 88/52 (BP Location: Left Arm, Patient Position: Sitting, Cuff Size: Normal)   Pulse 76   Ht 5' 4.5" (1.638 m)   Wt 188 lb (85.3 kg)   LMP 02/11/2014   BMI 31.77 kg/m  General:  Well developed, well nourished, no acute distress Skin:  Warm and dry Neck:  Midline trachea, normal thyroid, good ROM, no lymphadenopathy Lungs; Clear to auscultation bilaterally Breast:  No dominant palpable mass, retraction, or nipple discharge, mass left breast, steri strips on,bruised  Cardiovascular: Regular rate and rhythm Abdomen:  Soft, non tender, no hepatosplenomegaly Pelvic:  External genitalia is normal in appearance, no lesions.  The vagina is normal in appearance. Urethra has no lesions or masses. The cervix is bulbous.Pap with HPV and GC/CHL performed.  Uterus is absent. No adnexal masses or tenderness noted.Bladder is non tender, no masses felt. Rectal: Good sphincter tone, no polyps, or hemorrhoids felt.  Hemoccult negative. Extremities/musculoskeletal:  No swelling or varicosities noted, no clubbing or cyanosis Psych:  No mood changes, alert and cooperative,seems happy PHQ 9 score 8.Declines meds for now.  Impression: 1. Encounter for gynecological examination with Papanicolaou smear of cervix   2. Screening for colorectal cancer   3. Malignant neoplasm of upper-outer quadrant of left female breast,  unspecified estrogen receptor status ([place])       Plan: Physical in 1 year Pap in 3 if normal Appt 5/30 with breast care team

## 2017-03-05 ENCOUNTER — Encounter: Payer: Self-pay | Admitting: *Deleted

## 2017-03-05 ENCOUNTER — Telehealth: Payer: Self-pay | Admitting: *Deleted

## 2017-03-05 ENCOUNTER — Other Ambulatory Visit: Payer: Self-pay | Admitting: *Deleted

## 2017-03-05 DIAGNOSIS — Z171 Estrogen receptor negative status [ER-]: Secondary | ICD-10-CM

## 2017-03-05 DIAGNOSIS — C50812 Malignant neoplasm of overlapping sites of left female breast: Secondary | ICD-10-CM | POA: Insufficient documentation

## 2017-03-05 NOTE — Telephone Encounter (Signed)
Confirmed [facility] for 5/30.18 at 1215 .  Instructions and contact information given.

## 2017-03-05 NOTE — Telephone Encounter (Signed)
Left vm regarding [facility] for 5.30.18. Contact information provided. 

## 2017-03-06 LAB — CYTOLOGY - PAP
CHLAMYDIA, DNA PROBE: NEGATIVE
Diagnosis: NEGATIVE
HPV: NOT DETECTED
NEISSERIA GONORRHEA: NEGATIVE

## 2017-03-11 ENCOUNTER — Encounter: Payer: Self-pay | Admitting: Oncology

## 2017-03-11 ENCOUNTER — Ambulatory Visit: Payer: BLUE CROSS/BLUE SHIELD | Admitting: Radiation Oncology

## 2017-03-11 ENCOUNTER — Ambulatory Visit
Admission: RE | Admit: 2017-03-11 | Discharge: 2017-03-11 | Disposition: A | Discharge status: Home / Self Care | Payer: 59 | Source: Ambulatory Visit | Attending: Radiation Oncology | Admitting: Radiation Oncology

## 2017-03-11 ENCOUNTER — Ambulatory Visit (HOSPITAL_BASED_OUTPATIENT_CLINIC_OR_DEPARTMENT_OTHER): Payer: 59 | Admitting: Oncology

## 2017-03-11 ENCOUNTER — Encounter: Payer: Self-pay | Admitting: Radiation Oncology

## 2017-03-11 ENCOUNTER — Other Ambulatory Visit (HOSPITAL_BASED_OUTPATIENT_CLINIC_OR_DEPARTMENT_OTHER): Payer: 59

## 2017-03-11 ENCOUNTER — Encounter: Payer: Self-pay | Admitting: Physical Therapy

## 2017-03-11 ENCOUNTER — Ambulatory Visit: Payer: 59 | Attending: General Surgery | Admitting: Physical Therapy

## 2017-03-11 VITALS — BP 109/53 | HR 76 | Temp 97.9°F | Resp 18 | Ht 64.5 in | Wt 189.7 lb

## 2017-03-11 DIAGNOSIS — Z171 Estrogen receptor negative status [ER-]: Secondary | ICD-10-CM

## 2017-03-11 DIAGNOSIS — C50812 Malignant neoplasm of overlapping sites of left female breast: Secondary | ICD-10-CM

## 2017-03-11 DIAGNOSIS — R293 Abnormal posture: Secondary | ICD-10-CM | POA: Diagnosis present

## 2017-03-11 DIAGNOSIS — C50412 Malignant neoplasm of upper-outer quadrant of left female breast: Secondary | ICD-10-CM

## 2017-03-11 LAB — CBC WITH DIFFERENTIAL/PLATELET
BASO%: 0.4 % (ref 0.0–2.0)
BASOS ABS: 0 10*3/uL (ref 0.0–0.1)
EOS%: 1.5 % (ref 0.0–7.0)
Eosinophils Absolute: 0.1 10*3/uL (ref 0.0–0.5)
HEMATOCRIT: 39.2 % (ref 34.8–46.6)
HEMOGLOBIN: 13.2 g/dL (ref 11.6–15.9)
LYMPH#: 2.3 10*3/uL (ref 0.9–3.3)
LYMPH%: 24.2 % (ref 14.0–49.7)
MCH: 29.5 pg (ref 25.1–34.0)
MCHC: 33.6 g/dL (ref 31.5–36.0)
MCV: 87.9 fL (ref 79.5–101.0)
MONO#: 0.8 10*3/uL (ref 0.1–0.9)
MONO%: 7.9 % (ref 0.0–14.0)
NEUT#: 6.3 10*3/uL (ref 1.5–6.5)
NEUT%: 66 % (ref 38.4–76.8)
PLATELETS: 361 10*3/uL (ref 145–400)
RBC: 4.46 10*6/uL (ref 3.70–5.45)
RDW: 12.7 % (ref 11.2–14.5)
WBC: 9.6 10*3/uL (ref 3.9–10.3)

## 2017-03-11 LAB — COMPREHENSIVE METABOLIC PANEL
ALBUMIN: 3.2 g/dL — AB (ref 3.5–5.0)
ALK PHOS: 86 U/L (ref 40–150)
ALT: 12 U/L (ref 0–55)
ANION GAP: 8 meq/L (ref 3–11)
AST: 13 U/L (ref 5–34)
BUN: 11.4 mg/dL (ref 7.0–26.0)
CALCIUM: 9.6 mg/dL (ref 8.4–10.4)
CHLORIDE: 104 meq/L (ref 98–109)
CO2: 28 mEq/L (ref 22–29)
CREATININE: 0.8 mg/dL (ref 0.6–1.1)
EGFR: >90 mL/min/{1.73_m2} (ref >90)
Glucose: 127 mg/dl (ref 70–140)
Potassium: 4.1 mEq/L (ref 3.5–5.1)
Sodium: 140 mEq/L (ref 136–145)
Total Bilirubin: 0.33 mg/dL (ref 0.20–1.20)
Total Protein: 6.9 g/dL (ref 6.4–8.3)

## 2017-03-11 NOTE — Progress Notes (Signed)
Radiation Oncology         [phone] ________________________________  Initial outpatient Consultation  Name: Maria Hester MRN: [id number]  Date: 03/11/2017  DOB: Feb 15, 1966  QP:[id number], Angelyn Punt, MD  Excell Seltzer, MD   REFERRING PHYSICIAN: Excell Seltzer, MD  DIAGNOSIS:    ICD-9-CM ICD-10-CM   1. 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 female who presented to [place], NP of OB-GYN on 02/18/17 for a self palpable left breast lump. Physical exam revealed a 7 cm firm mass and firm thickness at the 11:00 position. Diagnotic bilateral mammogram and US of the left breast were performed on 02/24/17. Mammography showed a distortion with suspicious calcifications spanning 7.5 cm in the UOQ of the left breast. On [place], a left breast mass measuring 1.4 x 0.8 x 1.4 cm in the 11:00 position 8 cm from the nipple, 2 adjacent masses in the left breast 2:00 position 3 cm from the nipple measuring together 0.8 x 0.4 x 0.7 cm, and abnormal appearing heterogeneous hypoechoic tissue in the UOQ 1:30 position 4 cm from the nipple. US of the left axilla showed 3 prominent lymph nodes with 1 appearing to have a cortical bulge.  Biopsies were performed on 03/02/17. Biopsy of the left breast in the 11:30 position revealed grade 3 IDC, DCIS, and lymphovascular invasion (ER -, PR -, HER2 +, Ki67 35%). Biopsy of the UOQ left breast revealed IDC and DCIS with calcifications and this carcinoma appeared mophologically similar to the 11:30 mass. Biopsy of a left axillary lymph node revealed metastatic carcinoma.  The patient presents today in multidisciplinary breast clinic to discuss treatment options for  the management of her disease.  GYN HISTORY  Menarchal: 15 LMP: May 2016 Contraceptive: TAH-BSO 2015 HRT: No GP: G2P2  PREVIOUS RADIATION THERAPY: No  PAST MEDICAL HISTORY:  has a past medical history of Breast cancer ([place]); Breast disorder; Complication of anesthesia; and Uterine fibroid.    PAST SURGICAL HISTORY: Past Surgical History:  Procedure Laterality Date  . ABDOMINAL HYSTERECTOMY    . ABDOMINOPLASTY N/A 02/28/2014   Procedure: ABDOMINOPLASTY with Panniculectomy;  Surgeon: [name], MD;  Location: [facility];  Service: Gynecology;  Laterality: N/A;  . APPENDECTOMY    . BILATERAL SALPINGECTOMY Bilateral 02/28/2014   Procedure: BILATERAL SALPINGECTOMY;  Surgeon: [name], MD;  Location: [facility];  Service: Gynecology;  Laterality: Bilateral;  . CATARACT EXTRACTION, BILATERAL    . reverse tubal ligation    . SCAR REVISION N/A 02/28/2014   Procedure: SCAR REVISION;  Surgeon: [name], MD;  Location: [facility];  Service: Gynecology;  Laterality: N/A;  . SUPRACERVICAL ABDOMINAL HYSTERECTOMY N/A 02/28/2014   Procedure: HYSTERECTOMY SUPRACERVICAL ABDOMINAL;  Surgeon: [name], MD;  Location: [facility];  Service: Gynecology;  Laterality: N/A;  . TUBAL LIGATION    . WISDOM TOOTH EXTRACTION      FAMILY HISTORY: family history includes Breast cancer in her cousin and maternal aunt; Cancer in her maternal grandfather and maternal grandmother; Diabetes in her father and paternal grandmother; Heart disease in her father, maternal grandfather, maternal grandmother, mother, paternal grandfather, and paternal grandmother; Hypertension in her mother; Kidney cancer in her paternal  grandmother; Melanoma in her paternal grandfather; Tuberculosis in her paternal grandfather.  SOCIAL HISTORY:  reports that she has quit smoking. Her smoking use included Cigarettes. She has a 17.00 pack-year smoking history. She has never used smokeless tobacco. She reports that she does not drink  alcohol or use drugs.  ALLERGIES: Patient has no known allergies.  MEDICATIONS:  Current Outpatient Prescriptions  Medication Sig Dispense Refill  . ALPRAZolam (XANAX) 0.5 MG tablet TAKE HALF TO 1 TABLET BY MOUTH EVERY 8 HOURS AS NEEDED FOR ANXIETY  2  . rosuvastatin (CRESTOR) 10 MG tablet Take 10 mg by mouth daily.     No current facility-administered medications for this encounter.     REVIEW OF SYSTEMS: A 10+ POINT REVIEW OF SYSTEMS WAS OBTAINED including neurology, dermatology, psychiatry, cardiac, respiratory, lymph, extremities, GI, GU, Musculoskeletal, constitutional, breasts, reproductive, HEENT. The patient reports night sweats, weight change, fatigue, muscle aches, SOB walking up stairs, a productive cough, heartburn, left breast lumps, depression, and left axillary swollen lymph glands. A complete review of systems is obtained and is otherwise negative.   PHYSICAL EXAM:  Vitals with BMI 03/11/2017  Height 5' 4.5"  Weight 189 lbs 11 oz  BMI 65.7  Systolic 846  Diastolic 53  Pulse 76  Respirations 18  General: Alert and oriented, in no acute distress HEENT: Head is normocephalic. Extraocular movements are intact. Oropharynx is clear. Neck: Neck is supple, no palpable cervical or supraclavicular lymphadenopathy. Heart: Regular in rate and rhythm with no murmurs, rubs, or gallops. Chest: Clear to auscultation bilaterally, with no rhonchi, wheezes, or rales. Abdomen: Soft, nontender, nondistended, with no rigidity or guarding. Extremities: No cyanosis or edema. Lymphatics: see Neck Exam Skin: No concerning lesions. Musculoskeletal: symmetric strength and muscle tone throughout. Neurologic: Cranial nerves II through XII are grossly intact. No obvious focalities. Speech is fluent. Coordination is intact. Psychiatric: Judgment and insight are intact. Affect is appropriate. Breasts: At the 12:30 position there is a 8 cm mass, left breast. A 1 cm palpable node in the left axilla.  No other palpable masses appreciated in the right breast or right axilla.   ECOG = 0  0 - Asymptomatic (Fully active, able to carry on all predisease activities without restriction)  1 - Symptomatic but completely ambulatory (Restricted in physically strenuous activity but ambulatory and able to carry out work of a light or sedentary nature. For example, light housework, office work)  2 - Symptomatic, <50% in bed during the day (Ambulatory and capable of all self care but unable to carry out any work activities. Up and about more than 50% of waking hours)  3 - Symptomatic, >50% in bed, but not bedbound (Capable of only limited self-care, confined to bed or chair 50% or more of waking hours)  4 - Bedbound (Completely disabled. Cannot carry on any self-care. Totally confined to bed or chair)  5 - Death   Eustace Pen MM, Creech RH, Tormey DC, et al. [phone]). "Toxicity and response criteria of the [facility]". [place] Oncol. 5 (6): 649-55   LABORATORY DATA:  Lab Results  Component Value Date   WBC 9.6 03/11/2017   HGB 13.2 03/11/2017   HCT 39.2 03/11/2017   MCV 87.9 03/11/2017   PLT 361 03/11/2017   CMP     Component Value Date/Time   NA 140 03/11/2017 1236   K 4.1 03/11/2017 1236   CL 106 03/01/2014 0607   CO2 28 03/11/2017 1236   GLUCOSE 127 03/11/2017 1236  BUN 11.4 03/11/2017 1236   CREATININE 0.8 03/11/2017 1236   CALCIUM 9.6 03/11/2017 1236   PROT 6.9 03/11/2017 1236   ALBUMIN 3.2 (L) 03/11/2017 1236   AST 13 03/11/2017 1236   ALT 12 03/11/2017 1236   ALKPHOS 86 03/11/2017 1236   BILITOT 0.33 03/11/2017 1236   GFRNONAA >90 03/01/2014 0607   GFRAA >90 03/01/2014 5176         RADIOGRAPHY: US Breast Ltd Uni Left Inc Axilla  Result Date: 02/24/2017 CLINICAL DATA:  51 year old female presenting for evaluation of a palpable thickening in the upper-outer quadrant of the left breast. EXAM: 2D DIGITAL DIAGNOSTIC BILATERAL MAMMOGRAM WITH CAD AND  ADJUNCT TOMO LEFT BREAST ULTRASOUND COMPARISON:  Previous exam(s). ACR Breast Density Category c: The breast tissue is heterogeneously dense, which may obscure small masses. FINDINGS: A BB has been placed on the superior aspect of the left breast indicating the palpable site of concern. Deep to the palpable marker there is a broad area of distortion and new pleomorphic calcifications spanning approximately 7.5 cm. There are possible obscured masses in the area of distortion and calcifications. No other suspicious calcifications, masses or areas of distortion are seen in the bilateral breasts. Mammographic images were processed with CAD. Physical exam of the upper-outer quadrant of the left breast demonstrates a broad firm area involving the upper outer quadrant of the left breast. The mass is visibly protruding. The patient experiences tenderness with palpation. Ultrasound of the left breast at 11 o'clock, 8 cm from the nipple demonstrates an irregular hypoechoic mass with indistinct margins and a slight echogenic rim measuring 1.4 x 0.8 x 1.4 cm. In the left breast at 2 o'clock, 3 cm from the nipple, there are 2 adjacent hypoechoic oval masses together measuring 0.8 x 0.4 x 0.7 cm. Generally in the upper-outer quadrant of the left breast, there is abnormal appearing heterogeneous hypoechoic tissue with a representative image acquired at 1:30, 4 cm from the nipple. Ultrasound of the left axilla demonstrates at least 3 prominent lymph nodes, 1 of which appears to have a cortical bulge. IMPRESSION: 1. Distortion with suspicious calcifications span at least 7.5 cm growth of tissue in the upper-outer quadrant of the left breast mammographically. There are multiple masses and abnormal tissue seen sonographically in the upper-outer quadrant of the left breast. 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 2. There at least 3 prominent left axillary lymph nodes, 1 of which appears to have a cortical  bulge. 3.  No evidence of malignancy in the right breast. RECOMMENDATION: 1. Ultrasound-guided biopsy is recommended for the left breast mass at 11 o'clock and for the left axillary lymph node with cortical bulge. 2. Stereotactic biopsy is recommended immediately following ultrasound-guided biopsy for a distant site of calcifications. These procedures will be performed in [place]. I have discussed the findings and recommendations with the patient. Results were also provided in writing at the conclusion of the visit. If applicable, a reminder letter will be sent to the patient regarding the next appointment. BI-RADS CATEGORY  5: Highly suggestive of malignancy. Electronically Signed   By: Ammie Ferrier M.D.   On: 02/24/2017 11:58   Mm Diag Breast Tomo Bilateral  Result Date: 02/24/2017 CLINICAL DATA:  51 year old female presenting for evaluation of a palpable thickening in the upper-outer quadrant of the left breast. EXAM: 2D DIGITAL DIAGNOSTIC BILATERAL MAMMOGRAM WITH CAD AND ADJUNCT TOMO LEFT BREAST ULTRASOUND COMPARISON:  Previous exam(s). ACR Breast Density Category c: The breast tissue is  heterogeneously dense, which may obscure small masses. FINDINGS: A BB has been placed on the superior aspect of the left breast indicating the palpable site of concern. Deep to the palpable marker there is a broad area of distortion and new pleomorphic calcifications spanning approximately 7.5 cm. There are possible obscured masses in the area of distortion and calcifications. No other suspicious calcifications, masses or areas of distortion are seen in the bilateral breasts. Mammographic images were processed with CAD. Physical exam of the upper-outer quadrant of the left breast demonstrates a broad firm area involving the upper outer quadrant of the left breast. The mass is visibly protruding. The patient experiences tenderness with palpation. Ultrasound of the left breast at 11 o'clock, 8 cm from the nipple  demonstrates an irregular hypoechoic mass with indistinct margins and a slight echogenic rim measuring 1.4 x 0.8 x 1.4 cm. In the left breast at 2 o'clock, 3 cm from the nipple, there are 2 adjacent hypoechoic oval masses together measuring 0.8 x 0.4 x 0.7 cm. Generally in the upper-outer quadrant of the left breast, there is abnormal appearing heterogeneous hypoechoic tissue with a representative image acquired at 1:30, 4 cm from the nipple. Ultrasound of the left axilla demonstrates at least 3 prominent lymph nodes, 1 of which appears to have a cortical bulge. IMPRESSION: 1. Distortion with suspicious calcifications span at least 7.5 cm growth of tissue in the upper-outer quadrant of the left breast mammographically. There are multiple masses and abnormal tissue seen sonographically in the upper-outer quadrant of the left breast. 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 2. There at least 3 prominent left axillary lymph nodes, 1 of which appears to have a cortical bulge. 3.  No evidence of malignancy in the right breast. RECOMMENDATION: 1. Ultrasound-guided biopsy is recommended for the left breast mass at 11 o'clock and for the left axillary lymph node with cortical bulge. 2. Stereotactic biopsy is recommended immediately following ultrasound-guided biopsy for a distant site of calcifications. These procedures will be performed in [place]. I have discussed the findings and recommendations with the patient. Results were also provided in writing at the conclusion of the visit. If applicable, a reminder letter will be sent to the patient regarding the next appointment. BI-RADS CATEGORY  5: Highly suggestive of malignancy. Electronically Signed   By: Ammie Ferrier M.D.   On: 02/24/2017 11:58   [place] Axillary Node Core Biopsy Left  Addendum Date: 03/03/2017   ADDENDUM REPORT: 03/03/2017 11:52 ADDENDUM: Pathology revealed grade III invasive ductal carcinoma and ductal carcinoma in  situ with lymphovascular invasion in the left breast at 11:30, grade III invasive ductal carcinoma with ductal carcinoma in situ with calcifications in the left upper outer quadrant and metastatic carcinoma in the left axillary lymph node. This was found to be concordant by Dr. Ammie Ferrier. Pathology results were discussed with the patient by telephone. The patient reported doing well after the biopsies with tenderness at the sites. Post biopsy instructions and care were reviewed and questions were answered. The patient was encouraged to call [facility] for any additional concerns. The patient was referred to the [facility] at the [facility] on Mar 11, 2017. If breast conservation is being considered, a breast MRI or additional biopsies should be considered. Pathology results reported by Susa Raring RN, BSN on 03/03/2017. Electronically Signed   By: Ammie Ferrier M.D.   On: 03/03/2017 11:52   Result Date:  03/03/2017 CLINICAL DATA:  51 year old female presenting for ultrasound-guided biopsy of a left breast mass and left axillary lymph node. EXAM: ULTRASOUND GUIDED LEFT BREAST CORE NEEDLE BIOPSY COMPARISON:  Previous exam(s). FINDINGS: I met with the patient and we discussed the procedure of ultrasound-guided biopsy, including benefits and alternatives. We discussed the high likelihood of a successful procedure. We discussed the risks of the procedure, including infection, bleeding, tissue injury, clip migration, and inadequate sampling. Informed written consent was given. The usual time-out protocol was performed immediately prior to the procedure. Lesion quadrant: Upper-inner Using sterile technique and 1% Lidocaine as local anesthetic, under direct ultrasound visualization, a 14 gauge spring-loaded device was used to perform biopsy of a left breast mass at 11:30 using a lateral approach. At the conclusion of the procedure a  ribbon shaped tissue marker clip was deployed into the biopsy cavity. Lesion quadrant: Left axilla Using sterile technique and 1% Lidocaine as local anesthetic, under direct ultrasound visualization, a 14 gauge spring-loaded device was used to perform biopsy of a left axillary lymph node using an inferior approach. At the conclusion of the procedure a spiral shaped tissue marker clip was deployed into the biopsy cavity. Follow up 2 view mammogram was performed and dictated separately. IMPRESSION: 1. Ultrasound guided biopsy of left breast mass 1130. No apparent complications. 2. Ultrasound-guided biopsy of a left axillary lymph node. No apparent complications. Electronically Signed: By: Ammie Ferrier M.D. On: 03/02/2017 09:25   Mm Clip Placement Left  Result Date: 03/02/2017 CLINICAL DATA:  Post biopsy mammogram of the left breast for clip placement. EXAM: DIAGNOSTIC LEFT MAMMOGRAM POST ULTRASOUND AND STEREOTACTIC BIOPSIES COMPARISON:  Previous exam(s). FINDINGS: Mammographic images were obtained following ultrasound-guided biopsy of a left breast mass and left axillary lymph node and stereotactic guided biopsy of left breast calcifications. A ribbon shaped biopsy marking clip from the ultrasound-guided biopsy is positioned at the site of the biopsied mass at 11:30 in the left breast. The X shaped biopsy marking clip from the stereotactic biopsy is positioned in the upper-outer quadrant of the left breast, middle depth. The clip is located approximately 4.2 cm away from the ribbon shaped biopsy marking clip in the craniocaudal view. Note however, that the calcifications in the left breast span approximately 7 cm of tissue. The spiral shaped biopsy marking clip in the left axillary lymph node is not visualized on the images. IMPRESSION: 1. The ribbon shaped biopsy marking clip is appropriately positioned at the site of the biopsied mass under ultrasound guidance at 11:30. 2. The X shaped biopsy marking clip  is appropriately positioned at the site of the biopsied calcifications in the upper-outer quadrant of the left breast. 3. The spiral shaped biopsy marking clip in the left axillary lymph node is not visualized on these images. 4. The 2 clips in the left breast are approximately 4.2 cm apart. Note that the span of calcifications is approximately 7 cm. Final Assessment: Post Procedure Mammograms for Marker Placement Electronically Signed   By: Ammie Ferrier M.D.   On: 03/02/2017 09:32   Mm Lt Breast Bx W Loc Dev 1st Lesion Image Bx Spec Stereo Guide  Addendum Date: 03/03/2017   ADDENDUM REPORT: 03/03/2017 11:53 ADDENDUM: Pathology revealed grade III invasive ductal carcinoma and ductal carcinoma in situ with lymphovascular invasion in the left breast at 11:30, grade III invasive ductal carcinoma with ductal carcinoma in situ with calcifications in the left upper outer quadrant and metastatic carcinoma in the left axillary lymph node. This was found  to be concordant by Dr. Ammie Ferrier. Pathology results were discussed with the patient by telephone. The patient reported doing well after the biopsies with tenderness at the sites. Post biopsy instructions and care were reviewed and questions were answered. The patient was encouraged to call [facility] for any additional concerns. The patient was referred to the [facility] at the [facility] on Mar 11, 2017. If breast conservation is being considered, a breast MRI or additional biopsies should be considered. Pathology results reported by Susa Raring RN, BSN on 03/03/2017. Electronically Signed   By: Ammie Ferrier M.D.   On: 03/03/2017 11:53   Result Date: 03/03/2017 CLINICAL DATA:  51 year old female presenting for stereotactic biopsy of left breast calcifications. EXAM: LEFT BREAST STEREOTACTIC CORE NEEDLE BIOPSY COMPARISON:  Previous exams. FINDINGS: The patient and I  discussed the procedure of stereotactic-guided biopsy including benefits and alternatives. We discussed the high likelihood of a successful procedure. We discussed the risks of the procedure including infection, bleeding, tissue injury, clip migration, and inadequate sampling. Informed written consent was given. The usual time out protocol was performed immediately prior to the procedure. Using sterile technique and 1% Lidocaine as local anesthetic, under stereotactic guidance, a 9 gauge vacuum assisted device was used to perform core needle biopsy of calcifications in the upper-outer quadrant of the left breast using a superior approach. Specimen radiograph was performed showing calcifications within all of the core samples. Specimens with calcifications are identified for pathology. Lesion quadrant: Upper-outer At the conclusion of the procedure, a X shaped tissue marker clip was deployed into the biopsy cavity. Follow-up 2-view mammogram was performed and dictated separately. IMPRESSION: Stereotactic-guided biopsy of calcifications in the upper-outer quadrant of the left breast. No apparent complications. Electronically Signed: By: Ammie Ferrier M.D. 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 This was found to be concordant by Dr. Ammie Ferrier. Pathology results were discussed with the patient by telephone. The patient reported doing well after the biopsies with tenderness at the sites. Post biopsy instructions and care were reviewed and questions were answered. The patient was  encouraged to call [facility] for any additional concerns. The patient was referred to the [place] [facility] at the [facility] on Mar 11, 2017. If breast conservation is being considered, a breast MRI or additional biopsies should be considered. Pathology results reported by Susa Raring RN, BSN on 03/03/2017. Electronically Signed   By: Ammie Ferrier M.D.   On: 03/03/2017 11:52   Result Date: 03/03/2017 CLINICAL DATA:  51 year old female presenting for ultrasound-guided biopsy of a left breast mass and left axillary lymph node. EXAM: ULTRASOUND GUIDED LEFT BREAST CORE NEEDLE BIOPSY COMPARISON:  Previous exam(s). FINDINGS: I met with the patient and we discussed the procedure of ultrasound-guided biopsy, including benefits and alternatives. We discussed the high likelihood of a successful procedure. We discussed the risks of the procedure, including infection, bleeding, tissue injury, clip migration, and inadequate sampling. Informed written consent was given. The usual time-out  protocol was performed immediately prior to the procedure. Lesion quadrant: Upper-inner Using sterile technique and 1% Lidocaine as local anesthetic, under direct ultrasound visualization, a 14 gauge spring-loaded device was used to perform biopsy of a left breast mass at 11:30 using a lateral approach. At the conclusion of the procedure a ribbon shaped tissue marker clip was deployed into the biopsy cavity. Lesion quadrant: Left axilla Using sterile technique and 1% Lidocaine as local anesthetic, under direct ultrasound visualization, a 14 gauge spring-loaded device was used to perform biopsy of a left axillary lymph node using an inferior approach. At the conclusion of the procedure a spiral shaped tissue marker clip was deployed into the biopsy cavity. Follow up 2 view mammogram was performed and dictated separately. IMPRESSION: 1. Ultrasound guided biopsy of  left breast mass 1130. No apparent complications. 2. Ultrasound-guided biopsy of a left axillary lymph node. No apparent complications. Electronically Signed: By: Ammie Ferrier M.D. On: 03/02/2017 09:25      IMPRESSION/PLAN: Locally advanced left breast cancer.    It was a pleasure meeting the patient today. Due to the patient's HER2 positive disease and the extent of disease, chemotherapy will be given neo-adjuvantly. We discussed the risks, benefits, and side effects of radiotherapy. I recommend radiotherapy to the left breast and regional nodes to reduce her risk of locoregional recurrence by 2/3.  We discussed that radiation would take approximately 6 weeks to complete in a post-surgical setting.  We spoke about acute effects including skin irritation and fatigue as well as much less common late effects including internal organ injury or irritation. We spoke about the latest technology that is used to minimize the risk of late effects for patients undergoing radiotherapy to the breast or chest wall. No guarantees of treatment were given. The patient is enthusiastic about proceeding with treatment. I look forward to participating in the patient's care.    I will await her referral back to me for postoperative follow-up and eventual CT simulation/treatment planning. Prior to chemotherapy, the patient will be scheduled for a bilateral breast MRI and CT/Bone scan for a baseline and to view the extent of disease. The patient will also be scheduled for genetic counseling.    She lives in [place]; we discussed possible RT there, but she expressed interest in driving to [facility] for her adjuvant therapy.    __________________________________________   Eppie Gibson, MD  This document serves as a record of services personally performed by Eppie Gibson, MD. It was created on her behalf by Darcus Austin, a trained medical scribe. The creation of this record is based on the scribe's personal observations and  the provider's statements to them. This document has been checked and approved by the attending provider.

## 2017-03-11 NOTE — Progress Notes (Signed)
[place] Psychosocial Distress Screening Spiritual Care  Counselor met with patient, pt's husband, and pt's sister in [facility] to introduce [place] team/resources, reviewing distress screen per protocol.The patient scored a 7 on the Psychosocial Distress Thermometer which indicates Moderate/Severe distress. Also assessed for distress and other psychosocial needs.   Counselor discussed patient's distress screening with pt; pt shared that she has been "hit by overwhelming sadness randomly since the official diagnosis." Counselor and pt processed how pt has been coping with these feelings and counselor normalized and validated pt's experience. Pt stated that the Breast Clinic has been helpful in giving her the information about her diagnosis and the tx process.   Follow up needed: Yes, counselor will follow up with pt next week.   ONCBCN DISTRESS SCREENING 03/11/2017  Screening Type Initial Screening  Distress experienced in past week (1-10) 7  Emotional problem type Adjusting to illness  Information Concerns Type Lack of info about diagnosis  Referral to support programs Yes    Westly Pam, [facility] [place]

## 2017-03-11 NOTE — Progress Notes (Signed)
Nutrition Assessment  Reason for Assessment:  Pt seen in Breast Clinic  ASSESSMENT:   52 year old female with breast cancer.  Past medical history reviewed  Patient reports good appetite, stress eating lately due to diagnosis  Medications:  reviewed  Labs: reviewed  Anthropometrics:   Height: 64.5 inches Weight: 188 lb BMI: 31   NUTRITION DIAGNOSIS: Food and nutrition related knowledge deficit related to new diagnosis of breast cancer as evidenced by no prior need for nutrition related information.  INTERVENTION:   Discussed and provided packet of information regarding nutritional tips for breast cancer patients.  Questions answered.  Teachback method used.  Contact information provided and patient knows to contact me with questions/concerns.    MONITORING, EVALUATION, and GOAL: Pt will consume a healthy plant based diet to maintain lean body mass throughout treatment.   Rechel Delosreyes B. Zenia Resides, [place], [place] Registered Dietitian [phone] (pager)

## 2017-03-11 NOTE — Patient Instructions (Signed)

## 2017-03-11 NOTE — Progress Notes (Signed)
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 She brought this to medical attention and on 02/24/2017 underwent bilateral diagnostic mammography with tomography and left breast ultrasonography at the breast Center. The breast density was category C. In the upper portion of the left breast there was a broad area of distortion measuring up to 7.5 cm. On exam there was a broad firm area involving the upper outer quadrant of the left breast which was visibly protruding. By ultrasound at the 11:00 axis 8 cm from the nipple there was an irregular hypoechoic mass measuring 1.4 cm with additiona ases at 2:00, 3 cm from the nipple and multiple other masses as described, all in the upper-outer quadrant primarily. Ultrasound of the left axilla showed at least 3 prominent lymph nodes one of which had a cortical bulge.  On 03/02/2017 the patient underwent biopsy of a left breast mass at the 11:30 o'clock position and a second mass described as upper outer quadrant as well as one of the suspicious left axillary lymph nodes. These all showed invasive ductal carcinoma, grade 3. Prognostic panel from one of the 2 masses showed the tumor to be estrogen and progesterone receptor negative, but HER-2 amplified, with a signals ratio of 8.24 and the  number per cell 15.65.  Her subsequent history is as detailed below  INTERVAL HISTORY: Maria Hester was evaluated in the multidisciplinary breast cancer clinic 03/11/2017 accompanied by her husband Maria Hester and her sister share. Her case was also presented in the multidisciplinary breast cancer conference that same morning. At that time a preliminary plan was proposed: Genetics testing, neoadjuvant chemotherapy, surgery to follow, adjuvant radiation, with consideration of the genetics sensitivity study  REVIEW OF SYSTEMS: Aside from the mass itself, there were no specific symptoms leading to the original mammogram, which was routinely scheduled. The patient denies unusual headaches, visual changes, nausea, vomiting, stiff neck, dizziness, or gait imbalance. There has been no cough, phlegm production, or pleurisy, no chest pain or pressure, and no change in bowel or bladder habits. The patient denies fever, rash, bleeding, unexplained fatigue or unexplained weight loss. She admits to some night sweats, some shortness of breath when climbing stairs, occasional mild productive cough, heartburn, and depression. A detailed review of systems was otherwise entirely negative.   PAST MEDICAL HISTORY: Past Medical History:  Diagnosis Date  . Breast cancer ([place])   . 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 Complication of anesthesia    pt states BP tends to drop and has a hard time waking up - pt was told by prior anesthesiologist to make sure this was made known for furture surgeries  . Uterine fibroid     PAST SURGICAL HISTORY: Past Surgical History:  Procedure Laterality Date  . ABDOMINAL HYSTERECTOMY    . ABDOMINOPLASTY N/A 02/28/2014   Procedure: ABDOMINOPLASTY with Panniculectomy;  Surgeon:  Maria Kind, MD;  Location: [facility];  Service: Gynecology;  Laterality: N/A;  . APPENDECTOMY    . BILATERAL SALPINGECTOMY Bilateral 02/28/2014   Procedure:  BILATERAL SALPINGECTOMY;  Surgeon: Maria Kind, MD;  Location: [facility];  Service: Gynecology;  Laterality: Bilateral;  . CATARACT EXTRACTION, BILATERAL    . reverse tubal ligation    . SCAR REVISION N/A 02/28/2014   Procedure: SCAR REVISION;  Surgeon: Maria Kind, MD;  Location: [facility];  Service: Gynecology;  Laterality: N/A;  . SUPRACERVICAL ABDOMINAL HYSTERECTOMY N/A 02/28/2014   Procedure: HYSTERECTOMY SUPRACERVICAL ABDOMINAL;  Surgeon: Maria Kind, MD;  Location: [facility];  Service: Gynecology;  Laterality: N/A;  . TUBAL LIGATION    . WISDOM TOOTH EXTRACTION      FAMILY HISTORY Family History  Problem Relation Age of Onset  . Heart disease Mother   . Hypertension Mother   . Diabetes Father   . Heart disease Father   . Cancer Maternal Grandmother        intestines  . Heart disease Maternal Grandmother   . Heart disease Maternal Grandfather   . Cancer Maternal Grandfather        esophageal  . Diabetes Paternal Grandmother   . Heart disease Paternal Grandmother   . Kidney cancer Paternal Grandmother   . Tuberculosis Paternal Grandfather   . Heart disease Paternal Grandfather   . Melanoma Paternal Grandfather   . Breast cancer Maternal Aunt   . Breast cancer Cousin   The patient's father died from heart disease at age 69. The patient's mother is living at age 2. The patient has one brother, 2 sisters. On the mother's side and aunt had breast cancer at age 18 and her daughter had breast cancer in her early 25s. There is also a grandfather with esophageal cancer and a grandmother with colon cancer. On the paternal side there is a grandmother with kidney cancer at an early age and a grandfather with melanoma at an early age.  GYNECOLOGIC HISTORY:  Patient's last menstrual period was 02/11/2014.  Menarche age 78, first live birth age 73, the patient is [name] P2. She underwent hysterectomy without salpingo-oophorectomy May 2016. She did not use hormone replacement. She did use  oral contraceptives approximately 10 years in the 1980s.  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 Her husband Maria Hester in the same [place], which is a 10% aviation enrollment. Daughter Lorenda Hatchet is a hairstylist in [place] and son Chip Boer is a Physiological scientist in [place] the patient has 2 grandsons and one granddaughter. She attends a [employer]    ADVANCED DIRECTIVES: Not in place. At the 03/11/2017 visit the patient was given the appropriate documents to complete and notarize at her discretion   HEALTH MAINTENANCE: Social History  Substance Use Topics  . Smoking status: Former Smoker    Packs/day: 0.50    Years: 34.00    Types: Cigarettes  . Smokeless tobacco: Never Used  . Alcohol use No     Comment: social drink      Colonoscopy:No  PAP: 03/04/2017  Bone density: No   No Known Allergies  Current Outpatient Prescriptions  Medication Sig Dispense Refill  . ALPRAZolam (XANAX) 0.5 MG tablet TAKE HALF TO 1 TABLET BY MOUTH EVERY 8 HOURS AS NEEDED FOR ANXIETY  2  . rosuvastatin (CRESTOR) 10 MG tablet Take 10 mg by mouth daily.     No current facility-administered  medications for this visit.     OBJECTIVE: Middle-aged white woman who appears well Vitals:   03/11/17 1329  BP: (!) 109/53  Pulse: 76  Resp: 18  Temp: 97.9 F (36.6 C)     Body mass index is 32.06 kg/m.    ECOG FS:0 - Asymptomatic  Ocular: Sclerae unicteric, pupils equal, round and reactive to light Ear-nose-throat: Oropharynx clear and moist Lymphatic: No cervical or supraclavicular adenopathy Lungs no rales or rhonchi, good excursion bilaterally Heart regular rate and rhythm, no murmur appreciated Abd soft, nontender, positive bowel sounds MSK no focal spinal tenderness, no joint edema Neuro: non-focal, well-oriented, appropriate affect Breasts: The right breast is unremarkable. There is a large area of  induration in the upper portion of the left breast which is movable, not associated with erythema or nipple retraction. Both axillae are benign.   LAB RESULTS:  CMP     Component Value Date/Time   NA 140 03/11/2017 1236   K 4.1 03/11/2017 1236   CL 106 03/01/2014 0607   CO2 28 03/11/2017 1236   GLUCOSE 127 03/11/2017 1236   BUN 11.4 03/11/2017 1236   CREATININE 0.8 03/11/2017 1236   CALCIUM 9.6 03/11/2017 1236   PROT 6.9 03/11/2017 1236   ALBUMIN 3.2 (L) 03/11/2017 1236   AST 13 03/11/2017 1236   ALT 12 03/11/2017 1236   ALKPHOS 86 03/11/2017 1236   BILITOT 0.33 03/11/2017 1236   GFRNONAA >90 03/01/2014 0607   GFRAA >90 03/01/2014 0607    No results found for: TOTALPROTELP, ALBUMINELP, A1GS, A2GS, BETS, BETA2SER, GAMS, MSPIKE, SPEI  No results found for: Nils Pyle, [facility]  Lab Results  Component Value Date   WBC 9.6 03/11/2017   NEUTROABS 6.3 03/11/2017   HGB 13.2 03/11/2017   HCT 39.2 03/11/2017   MCV 87.9 03/11/2017   PLT 361 03/11/2017      Chemistry      Component Value Date/Time   NA 140 03/11/2017 1236   K 4.1 03/11/2017 1236   CL 106 03/01/2014 0607   CO2 28 03/11/2017 1236   BUN 11.4 03/11/2017 1236   CREATININE 0.8 03/11/2017 1236      Component Value Date/Time   CALCIUM 9.6 03/11/2017 1236   ALKPHOS 86 03/11/2017 1236   AST 13 03/11/2017 1236   ALT 12 03/11/2017 1236   BILITOT 0.33 03/11/2017 1236       No results found for: LABCA2  No components found for: ALPFXT024  No results for input(s): INR in the last 168 hours.  Urinalysis    Component Value Date/Time   COLORURINE YELLOW 02/23/2014 1004   APPEARANCEUR CLEAR 02/23/2014 1004   LABSPEC 1.025 02/23/2014 1004   PHURINE 5.5 02/23/2014 1004   GLUCOSEU NEGATIVE 02/23/2014 1004   HGBUR NEGATIVE 02/23/2014 1004   Icard 02/23/2014 1004   KETONESUR NEGATIVE 02/23/2014 1004   PROTEINUR NEGATIVE 02/23/2014 1004   UROBILINOGEN 0.2 02/23/2014 1004    NITRITE NEGATIVE 02/23/2014 1004   LEUKOCYTESUR NEGATIVE 02/23/2014 1004     STUDIES: US Breast Ltd Uni Left Inc Axilla  Result Date: 02/24/2017 CLINICAL DATA:  51 year old female presenting for evaluation of a palpable thickening in the upper-outer quadrant of the left breast. EXAM: 2D DIGITAL DIAGNOSTIC BILATERAL MAMMOGRAM WITH CAD AND ADJUNCT TOMO LEFT BREAST ULTRASOUND COMPARISON:  Previous exam(s). ACR Breast Density Category c: The breast tissue is heterogeneously dense, which may obscure small masses. FINDINGS: A BB has been placed on the superior aspect of the left breast indicating the  palpable site of concern. Deep to the palpable marker there is a broad area of distortion and new pleomorphic calcifications spanning approximately 7.5 cm. There are possible obscured masses in the area of distortion and calcifications. No other suspicious calcifications, masses or areas of distortion are seen in the bilateral breasts. Mammographic images were processed with CAD. Physical exam of the upper-outer quadrant of the left breast demonstrates a broad firm area involving the upper outer quadrant of the left breast. The mass is visibly protruding. The patient experiences tenderness with palpation. Ultrasound of the left breast at 11 o'clock, 8 cm from the nipple demonstrates an irregular hypoechoic mass with indistinct margins and a slight echogenic rim measuring 1.4 x 0.8 x 1.4 cm. In the left breast at 2 o'clock, 3 cm from the nipple, there are 2 adjacent hypoechoic oval masses together measuring 0.8 x 0.4 x 0.7 cm. Generally in the upper-outer quadrant of the left breast, there is abnormal appearing heterogeneous hypoechoic tissue with a representative image acquired at 1:30, 4 cm from the nipple. Ultrasound of the left axilla demonstrates at least 3 prominent lymph nodes, 1 of which appears to have a cortical bulge. IMPRESSION: 1. Distortion with suspicious calcifications span at least 7.5 cm growth of  tissue in the upper-outer quadrant of the left breast mammographically. There are multiple masses and abnormal tissue seen sonographically in the upper-outer quadrant of the left breast. 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 2. There at least 3 prominent left axillary lymph nodes, 1 of which appears to have a cortical bulge. 3.  No evidence of malignancy in the right breast. RECOMMENDATION: 1. Ultrasound-guided biopsy is recommended for the left breast mass at 11 o'clock and for the left axillary lymph node with cortical bulge. 2. Stereotactic biopsy is recommended immediately following ultrasound-guided biopsy for a distant site of calcifications. These procedures will be performed in [place]. I have discussed the findings and recommendations with the patient. Results were also provided in writing at the conclusion of the visit. If applicable, a reminder letter will be sent to the patient regarding the next appointment. BI-RADS CATEGORY  5: Highly suggestive of malignancy. Electronically Signed   By: Ammie Ferrier M.D.   On: 02/24/2017 11:58   Mm Diag Breast Tomo Bilateral  Result Date: 02/24/2017 CLINICAL DATA:  51 year old female presenting for evaluation of a palpable thickening in the upper-outer quadrant of the left breast. EXAM: 2D DIGITAL DIAGNOSTIC BILATERAL MAMMOGRAM WITH CAD AND ADJUNCT TOMO LEFT BREAST ULTRASOUND COMPARISON:  Previous exam(s). ACR Breast Density Category c: The breast tissue is heterogeneously dense, which may obscure small masses. FINDINGS: A BB has been placed on the superior aspect of the left breast indicating the palpable site of concern. Deep to the palpable marker there is a broad area of distortion and new pleomorphic calcifications spanning approximately 7.5 cm. There are possible obscured masses in the area of distortion and calcifications. No other suspicious calcifications, masses or areas of distortion are seen in the bilateral  breasts. Mammographic images were processed with CAD. Physical exam of the upper-outer quadrant of the left breast demonstrates a broad firm area involving the upper outer quadrant of the left breast. The mass is visibly protruding. The patient experiences tenderness with palpation. Ultrasound of the left breast at 11 o'clock, 8 cm from the nipple demonstrates an irregular hypoechoic mass with indistinct margins and a slight echogenic rim measuring 1.4 x 0.8 x 1.4 cm. In the left breast at 2 o'clock,  3 cm from the nipple, there are 2 adjacent hypoechoic oval masses together measuring 0.8 x 0.4 x 0.7 cm. Generally in the upper-outer quadrant of the left breast, there is abnormal appearing heterogeneous hypoechoic tissue with a representative image acquired at 1:30, 4 cm from the nipple. Ultrasound of the left axilla demonstrates at least 3 prominent lymph nodes, 1 of which appears to have a cortical bulge. IMPRESSION: 1. Distortion with suspicious calcifications span at least 7.5 cm growth of tissue in the upper-outer quadrant of the left breast mammographically. There are multiple masses and abnormal tissue seen sonographically in the upper-outer quadrant of the left breast. 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 2. There at least 3 prominent left axillary lymph nodes, 1 of which appears to have a cortical bulge. 3.  No evidence of malignancy in the right breast. RECOMMENDATION: 1. Ultrasound-guided biopsy is recommended for the left breast mass at 11 o'clock and for the left axillary lymph node with cortical bulge. 2. Stereotactic biopsy is recommended immediately following ultrasound-guided biopsy for a distant site of calcifications. These procedures will be performed in [place]. I have discussed the findings and recommendations with the patient. Results were also provided in writing at the conclusion of the visit. If applicable, a reminder letter will be sent to the patient  regarding the next appointment. BI-RADS CATEGORY  5: Highly suggestive of malignancy. Electronically Signed   By: Ammie Ferrier M.D.   On: 02/24/2017 11:58   [place] Axillary Node Core Biopsy Left  Addendum Date: 03/03/2017   ADDENDUM REPORT: 03/03/2017 11:52 ADDENDUM: Pathology revealed grade III invasive ductal carcinoma and ductal carcinoma in situ with lymphovascular invasion in the left breast at 11:30, grade III invasive ductal carcinoma with ductal carcinoma in situ with calcifications in the left upper outer quadrant and metastatic carcinoma in the left axillary lymph node. This was found to be concordant by Dr. Ammie Ferrier. Pathology results were discussed with the patient by telephone. The patient reported doing well after the biopsies with tenderness at the sites. Post biopsy instructions and care were reviewed and questions were answered. The patient was encouraged to call [facility] for any additional concerns. The patient was referred to the [facility] at the [facility] on Mar 11, 2017. If breast conservation is being considered, a breast MRI or additional biopsies should be considered. Pathology results reported by Susa Raring RN, BSN on 03/03/2017. Electronically Signed   By: Ammie Ferrier M.D.   On: 03/03/2017 11:52   Result Date: 03/03/2017 CLINICAL DATA:  51 year old female presenting for ultrasound-guided biopsy of a left breast mass and left axillary lymph node. EXAM: ULTRASOUND GUIDED LEFT BREAST CORE NEEDLE BIOPSY COMPARISON:  Previous exam(s). FINDINGS: I met with the patient and we discussed the procedure of ultrasound-guided biopsy, including benefits and alternatives. We discussed the high likelihood of a successful procedure. We discussed the risks of the procedure, including infection, bleeding, tissue injury, clip migration, and inadequate sampling. Informed written consent was given. The usual  time-out protocol was performed immediately prior to the procedure. Lesion quadrant: Upper-inner Using sterile technique and 1% Lidocaine as local anesthetic, under direct ultrasound visualization, a 14 gauge spring-loaded device was used to perform biopsy of a left breast mass at 11:30 using a lateral approach. At the conclusion of the procedure a ribbon shaped tissue marker clip was deployed into the biopsy cavity. Lesion quadrant: Left axilla Using sterile technique and  1% Lidocaine as local anesthetic, under direct ultrasound visualization, a 14 gauge spring-loaded device was used to perform biopsy of a left axillary lymph node using an inferior approach. At the conclusion of the procedure a spiral shaped tissue marker clip was deployed into the biopsy cavity. Follow up 2 view mammogram was performed and dictated separately. IMPRESSION: 1. Ultrasound guided biopsy of left breast mass 1130. No apparent complications. 2. Ultrasound-guided biopsy of a left axillary lymph node. No apparent complications. Electronically Signed: By: Ammie Ferrier M.D. On: 03/02/2017 09:25   Mm Clip Placement Left  Result Date: 03/02/2017 CLINICAL DATA:  Post biopsy mammogram of the left breast for clip placement. EXAM: DIAGNOSTIC LEFT MAMMOGRAM POST ULTRASOUND AND STEREOTACTIC BIOPSIES COMPARISON:  Previous exam(s). FINDINGS: Mammographic images were obtained following ultrasound-guided biopsy of a left breast mass and left axillary lymph node and stereotactic guided biopsy of left breast calcifications. A ribbon shaped biopsy marking clip from the ultrasound-guided biopsy is positioned at the site of the biopsied mass at 11:30 in the left breast. The X shaped biopsy marking clip from the stereotactic biopsy is positioned in the upper-outer quadrant of the left breast, middle depth. The clip is located approximately 4.2 cm away from the ribbon shaped biopsy marking clip in the craniocaudal view. Note however, that the  calcifications in the left breast span approximately 7 cm of tissue. The spiral shaped biopsy marking clip in the left axillary lymph node is not visualized on the images. IMPRESSION: 1. The ribbon shaped biopsy marking clip is appropriately positioned at the site of the biopsied mass under ultrasound guidance at 11:30. 2. The X shaped biopsy marking clip is appropriately positioned at the site of the biopsied calcifications in the upper-outer quadrant of the left breast. 3. The spiral shaped biopsy marking clip in the left axillary lymph node is not visualized on these images. 4. The 2 clips in the left breast are approximately 4.2 cm apart. Note that the span of calcifications is approximately 7 cm. Final Assessment: Post Procedure Mammograms for Marker Placement Electronically Signed   By: Ammie Ferrier M.D.   On: 03/02/2017 09:32   Mm Lt Breast Bx W Loc Dev 1st Lesion Image Bx Spec Stereo Guide  Addendum Date: 03/03/2017   ADDENDUM REPORT: 03/03/2017 11:53 ADDENDUM: Pathology revealed grade III invasive ductal carcinoma and ductal carcinoma in situ with lymphovascular invasion in the left breast at 11:30, grade III invasive ductal carcinoma with ductal carcinoma in situ with calcifications in the left upper outer quadrant and metastatic carcinoma in the left axillary lymph node. This was found to be concordant by Dr. Ammie Ferrier. Pathology results were discussed with the patient by telephone. The patient reported doing well after the biopsies with tenderness at the sites. Post biopsy instructions and care were reviewed and questions were answered. The patient was encouraged to call [facility] for any additional concerns. The patient was referred to the [facility] at the [facility] on Mar 11, 2017. If breast conservation is being considered, a breast MRI or additional biopsies should be considered. Pathology results  reported by Susa Raring RN, BSN on 03/03/2017. Electronically Signed   By: Ammie Ferrier M.D.   On: 03/03/2017 11:53   Result Date: 03/03/2017 CLINICAL DATA:  51 year old female presenting for stereotactic biopsy of left breast calcifications. EXAM: LEFT BREAST STEREOTACTIC CORE NEEDLE BIOPSY COMPARISON:  Previous exams. FINDINGS: The patient and I discussed the procedure of stereotactic-guided  biopsy including benefits and alternatives. We discussed the high likelihood of a successful procedure. We discussed the risks of the procedure including infection, bleeding, tissue injury, clip migration, and inadequate sampling. Informed written consent was given. The usual time out protocol was performed immediately prior to the procedure. Using sterile technique and 1% Lidocaine as local anesthetic, under stereotactic guidance, a 9 gauge vacuum assisted device was used to perform core needle biopsy of calcifications in the upper-outer quadrant of the left breast using a superior approach. Specimen radiograph was performed showing calcifications within all of the core samples. Specimens with calcifications are identified for pathology. Lesion quadrant: Upper-outer At the conclusion of the procedure, a X shaped tissue marker clip was deployed into the biopsy cavity. Follow-up 2-view mammogram was performed and dictated separately. IMPRESSION: Stereotactic-guided biopsy of calcifications in the upper-outer quadrant of the left breast. No apparent complications. Electronically Signed: By: Ammie Ferrier M.D. On: 03/02/2017 09:26   [place] Lt Breast Bx W Loc Dev 1st Lesion Img Bx Spec US Guide  Addendum Date: 03/03/2017   ADDENDUM REPORT: 03/03/2017 11:52 ADDENDUM: Pathology revealed grade III invasive ductal carcinoma and ductal carcinoma in situ with lymphovascular invasion in the left breast at 11:30, grade III invasive ductal carcinoma with ductal carcinoma in situ with calcifications in the left upper outer  quadrant and metastatic carcinoma in the left axillary lymph node. This was found to be concordant by Dr. Ammie Ferrier. Pathology results were discussed with the patient by telephone. The patient reported doing well after the biopsies with tenderness at the sites. Post biopsy instructions and care were reviewed and questions were answered. The patient was encouraged to call [facility] for any additional concerns. The patient was referred to the [facility] at the [facility] on Mar 11, 2017. If breast conservation is being considered, a breast MRI or additional biopsies should be considered. Pathology results reported by Susa Raring RN, BSN on 03/03/2017. Electronically Signed   By: Ammie Ferrier M.D.   On: 03/03/2017 11:52   Result Date: 03/03/2017 CLINICAL DATA:  51 year old female presenting for ultrasound-guided biopsy of a left breast mass and left axillary lymph node. EXAM: ULTRASOUND GUIDED LEFT BREAST CORE NEEDLE BIOPSY COMPARISON:  Previous exam(s). FINDINGS: I met with the patient and we discussed the procedure of ultrasound-guided biopsy, including benefits and alternatives. We discussed the high likelihood of a successful procedure. We discussed the risks of the procedure, including infection, bleeding, tissue injury, clip migration, and inadequate sampling. Informed written consent was given. The usual time-out protocol was performed immediately prior to the procedure. Lesion quadrant: Upper-inner Using sterile technique and 1% Lidocaine as local anesthetic, under direct ultrasound visualization, a 14 gauge spring-loaded device was used to perform biopsy of a left breast mass at 11:30 using a lateral approach. At the conclusion of the procedure a ribbon shaped tissue marker clip was deployed into the biopsy cavity. Lesion quadrant: Left axilla Using sterile technique and 1% Lidocaine as local anesthetic, under direct  ultrasound visualization, a 14 gauge spring-loaded device was used to perform biopsy of a left axillary lymph node using an inferior approach. At the conclusion of the procedure a spiral shaped tissue marker clip was deployed into the biopsy cavity. Follow up 2 view mammogram was performed and dictated separately. IMPRESSION: 1. Ultrasound guided biopsy of left breast mass 1130. No apparent complications. 2. Ultrasound-guided biopsy of a left axillary lymph node. No apparent complications. Electronically Signed:  By: Ammie Ferrier M.D. On: 03/02/2017 09:25    ELIGIBLE FOR AVAILABLE RESEARCH PROTOCOL: Genetic sensitivity study  ASSESSMENT: 51 y.o. [place], [place] woman status post left breast overlapping sites biopsy 03/02/2017 for a clinical T3 N1-2, stage IIIA invasive ductal carcinoma, grade 3, estrogen and progesterone receptor negative, HER-2 amplified, with an MIB-1 of 35%  (1) genetics testing pending  (2) neoadjuvant chemotherapy will consist of carboplatin and Taxotere given with trastuzumab and Pertuzumab every 21 days for 6 cycles.  (3) trastuzumab will be continue to complete one year  (4) definitive surgery to follow chemotherapy  (5) adjuvant radiation to follow surgery   PLAN: We spent the better part of today's hour-long appointment discussing the biology of breast cancer in general, and the specifics of the patient's tumor in particular.We first reviewed the fact that cancer is not one disease but more than 100 different diseases and that it is important to keep them separate-- otherwise when friends and relatives discuss their own cancer experiences with [place] confusion can result. Similarly we explained that if breast cancer spreads to the bone or liver, the patient would not have bone cancer or liver cancer, but breast cancer in the bone and breast cancer in the liver: one cancer in three places-- not 3 different cancers which otherwise would have to be treated in 3 different  ways.  We discussed the difference between local and systemic therapy. In terms of loco-regional treatment, lumpectomy plus radiation is equivalent to mastectomy as far as survival is concerned. In her case what type of surgery is best made dependent on reaction to her initial treatment. We also noted that in terms of sequencing of treatments, whether systemic therapy or surgery is done first does not affect the ultimate outcome.  We then discussed the rationale for systemic therapy. There is some risk that this cancer may have already spread to other parts of her body. We're going to be doing CT scans of the chest and a bone scan to demonstrate that we are not dealing with stage IV disease. I expect these to be negative, but scans cannot answer the question the patient really would like to know, which is whether she has microscopic disease elsewhere in her body.   Next we went over the options for systemic therapy which are anti-estrogens, anti-HER-2 immunotherapy, and chemotherapy. Aydan does not meet criteria for anti-estrogens.. She is a good candidate for anti-HER-2 immunotherapy and chemotherapy, which we recommend  Specifically we discussed the option of carboplatin and docetaxel given together with Pertuzumab and trastuzumab every 21 days for 6 cycles, to be followed by trastuzumab alone for one year. Juni is in agreement with this plan. She understands she will need a port, and echocardiogram, and initial breast MRI, and referral to our chemotherapy school.  Hopefully we will be able to start treatment by 03/31/2017. She will see me a few days before that to review the results of the upcoming tests and to discuss supportive therapy and anti-nausea agents.Hassan Rowan has a good understanding of the overall plan. She agrees with it. She knows the goal of treatment in her case is cure. She will call with any problems that may develop before her next visit here.  Maria Cruel, MD   03/11/2017  9:24 PM Medical Oncology and Hematology [facility] [address] [place], [place] [address] Tel. [phone]    Fax. [phone]

## 2017-03-11 NOTE — Therapy (Signed)
[place], [place], [address] Phone: [phone]   Fax:  [phone]  Physical Therapy Evaluation  Patient Details  Name: Maria Hester MRN: [id number] Date of Birth: 04/23/1966 Referring Provider: Dr. Excell Seltzer  Encounter Date: 03/11/2017      PT End of Session - 03/11/17 1636    Visit Number 1   Number of Visits 1   PT Start Time 1400   PT Stop Time 9211  Also saw pt from 1435-1445 for a total of 30 minutes   PT Time Calculation (min) 20 min   Activity Tolerance Patient tolerated treatment well   Behavior During Therapy [facility] for tasks assessed/performed      Past Medical History:  Diagnosis Date  . Breast cancer ([place])   . 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 Complication of anesthesia    pt states BP tends to drop and has a hard time waking up - pt was told by prior anesthesiologist to make sure this was made known for furture surgeries  . Uterine fibroid     Past Surgical History:  Procedure Laterality Date  . ABDOMINAL HYSTERECTOMY    . ABDOMINOPLASTY N/A 02/28/2014   Procedure: ABDOMINOPLASTY with Panniculectomy;  Surgeon: [name], MD;  Location: [facility];  Service: Gynecology;  Laterality: N/A;  . APPENDECTOMY    . BILATERAL SALPINGECTOMY Bilateral 02/28/2014   Procedure: BILATERAL SALPINGECTOMY;  Surgeon: [name], MD;  Location: [facility];  Service: Gynecology;  Laterality: Bilateral;  . CATARACT EXTRACTION, BILATERAL    . reverse tubal ligation    . SCAR REVISION N/A 02/28/2014   Procedure: SCAR REVISION;  Surgeon: [name], MD;  Location: [facility];  Service: Gynecology;  Laterality: N/A;  . SUPRACERVICAL ABDOMINAL HYSTERECTOMY N/A 02/28/2014   Procedure: HYSTERECTOMY SUPRACERVICAL ABDOMINAL;  Surgeon: [name], MD;  Location: [facility];  Service: Gynecology;  Laterality: N/A;  . TUBAL LIGATION     . WISDOM TOOTH EXTRACTION      There were no vitals filed for this visit.       Subjective Assessment - 03/11/17 1627    Subjective Patient reports she is here to be seen by her medical team for her newly diagnosed left breast cancer.   Patient is accompained by: Family member   Pertinent History Patient was diagnosed on 02/24/17 with left invasive ductal carcinoma breast cancer with DCIS. There is a 7.5 cm area of distortion in the upper outer quadrant and a 1.4 cm mass in the upper inner quadrant. It is ER/PR negative and HER2 positive. There is a positive axillary lymph node.   Patient Stated Goals Reduce lymphedema risk and learn post op shoulder ROM HEP   Currently in Pain? Yes   Pain Score 4    Pain Location Scapula   Pain Orientation Left   Pain Descriptors / Indicators Other (Comment)  Pulling   Pain Type Acute pain   Pain Onset 1 to 4 weeks ago   Pain Frequency Intermittent   Aggravating Factors  She reports she has had this scapular pain since her breast biopsy.   Pain Relieving Factors Unknown   Multiple Pain Sites No            [facility] PT Assessment - 03/11/17 0001      Assessment   Medical Diagnosis Left breast cancer   Referring Provider Dr. Excell Seltzer  Onset Date/Surgical Date 02/24/17   Hand Dominance Right   Prior Therapy none     Precautions   Precautions Other (comment)   Precaution Comments active cancer     Restrictions   Weight Bearing Restrictions No     Balance Screen   Has the patient fallen in the past 6 months No   Has the patient had a decrease in activity level because of a fear of falling?  No   Is the patient reluctant to leave their home because of a fear of falling?  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   Leisure She does not exercise     Cognition   Overall Cognitive Status Within Functional Limits for tasks assessed     Posture/Postural Control   Posture/Postural Control Postural limitations   Postural Limitations Rounded Shoulders;Forward head     ROM / Strength   AROM / PROM / Strength AROM;Strength     AROM   AROM Assessment Site Shoulder;Cervical   Right/Left Shoulder Right;Left   Right Shoulder Extension 42 Degrees   Right Shoulder Flexion 144 Degrees   Right Shoulder ABduction 155 Degrees   Right Shoulder Internal Rotation 79 Degrees   Right Shoulder External Rotation 76 Degrees   Left Shoulder Extension 64 Degrees   Left Shoulder Flexion 141 Degrees   Left Shoulder ABduction 146 Degrees   Left Shoulder Internal Rotation 67 Degrees   Left Shoulder External Rotation 80 Degrees   Cervical Flexion WNL   Cervical Extension WNL   Cervical - Right Side Bend 25% limited   Cervical - Left Side Bend WNL   Cervical - Right Rotation WNL   Cervical - Left Rotation WNL     Strength   Overall Strength Within functional limits for tasks performed           LYMPHEDEMA/ONCOLOGY QUESTIONNAIRE - 03/11/17 1635      Type   Cancer Type Left breast cancer     Lymphedema Assessments   Lymphedema Assessments Upper extremities     Right Upper Extremity Lymphedema   10 cm Proximal to Olecranon Process 30.8 cm   Olecranon Process 27.1 cm   10 cm Proximal to Ulnar Styloid Process 24.5 cm   Just Proximal to Ulnar Styloid Process 17 cm   Across Hand at [employer] 18.7 cm   At [place] of 2nd Digit 6.5 cm     Left Upper Extremity Lymphedema   10 cm Proximal to Olecranon Process 31 cm   Olecranon Process 26.8 cm   10 cm Proximal to Ulnar Styloid Process 22.3 cm   Just Proximal to Ulnar Styloid Process 16.9 cm   Across Hand at [employer] 18.4 cm   At [place] of 2nd Digit 6.3 cm         Objective  measurements completed on examination: See above findings.       Patient was instructed today in a home exercise program today for post op shoulder range of motion. These included active assist shoulder flexion in sitting, scapular retraction, wall walking with shoulder abduction, and hands behind head external rotation.  She was encouraged to do these twice a day, holding 3 seconds and  repeating 5 times when permitted by her physician.               PT Education - 03/11/17 1636    Education provided Yes   Education Details Lymphedema risk reduction and post op shoulder ROM HEP   Person(s) Educated Patient;Spouse   Methods Explanation;Demonstration;Handout   Comprehension Returned demonstration;Verbalized understanding              Breast Clinic Goals - 03/11/17 1641      Patient will be able to verbalize understanding of pertinent lymphedema risk reduction practices relevant to her diagnosis specifically related to skin care.   Time 1   Period Days   Status Achieved     Patient will be able to return demonstrate and/or verbalize understanding of the post-op home exercise program related to regaining shoulder range of motion.   Time 1   Period Days   Status Achieved     Patient will be able to verbalize understanding of the importance of attending the postoperative After Breast Cancer Class for further lymphedema risk reduction education and therapeutic exercise.   Time 1   Period Days   Status Achieved               Plan - 03/11/17 1637    Clinical Impression Statement Patient was diagnosed on 02/24/17 with left invasive ductal carcinoma breast cancer with DCIS. There is a 7.5 cm area of distortion in the upper outer quadrant and a 1.4 cm mass in the upper inner quadrant. It is ER/PR negative and HER2 positive. There is a positive axillary lymph node. Her multidisciplinary medical team met prior to her assessments to determine a recommended treatment plan.  She is planning to have an MRI, neoadjuvant chemotherapy, a left mastectomy with a targeted axillary dissection, and radiation. She may benefit from post op PT to regain shoulder ROM and reduce lympehdema risk.   History and Personal Factors relevant to plan of care: none   Clinical Presentation Stable   Clinical Presentation due to: Condition is stable   Clinical Decision Making Low   Rehab Potential Excellent   Clinical Impairments Affecting Rehab Potential None   PT Frequency One time visit   PT Treatment/Interventions Patient/family education;Therapeutic exercise   PT Next Visit Plan Will f/u after surgery to determine PT needs   PT Home Exercise Plan Post op shoulder ROM HEP   Consulted and Agree with Plan of Care Patient;Family member/caregiver   Family Member Consulted Husband      Patient will benefit from skilled therapeutic intervention in order to improve the following deficits and impairments:  Postural dysfunction, Decreased knowledge of precautions, Impaired UE functional use, Decreased range of motion, Pain  Visit Diagnosis: Abnormal posture - Plan: PT plan of care cert/re-cert  Carcinoma of overlapping sites of left breast in female, estrogen receptor negative ([place]) - Plan: PT plan of care cert/re-cert   Patient will follow up at outpatient cancer rehab if needed following surgery.  If the patient requires physical therapy at that time, a specific plan will be dictated and sent to the referring physician for approval. The patient was educated today on appropriate basic range of motion exercises to begin post operatively and the importance of attending the After Breast Cancer class following surgery.  Patient was educated today on lymphedema risk reduction practices as it pertains to recommendations that will benefit the patient immediately following surgery.  She verbalized good understanding.  No additional physical therapy is indicated at this  time.      Problem  List Patient Active Problem List   Diagnosis Date Noted  . Malignant neoplasm of overlapping sites of left breast in female, estrogen receptor negative ([place]) 03/05/2017  . Breast cancer ([place]) 03/04/2017  . Malignant neoplasm of upper-outer quadrant of left female breast ([place]) 03/04/2017  . Nontoxic multinodular goiter 12/25/2015  . Hyperlipidemia 12/25/2015  . Post-operative state 04/05/2014  . S/P subtotal hysterectomy 02/28/2014  . Chronic folliculitis 17/61/6073  . Annual physical exam 12/19/2013    Annia Friendly, PT 03/11/17 4:43 PM  [place], [place], [address] Phone: [phone]   Fax:  [phone]  Name: Maria Hester MRN: [id number] Date of Birth: [birth year]

## 2017-03-11 NOTE — Progress Notes (Signed)
START ON PATHWAY REGIMEN - Breast   Docetaxel  + Carboplatin + Trastuzumab + Pertuzumab (TCHP) q21 Days:   A cycle is every 21 days:     Pertuzumab      Pertuzumab      Trastuzumab      Trastuzumab      Carboplatin      Docetaxel   **Always confirm dose/schedule in your pharmacy ordering system**    Trastuzumab (Maintenance - NO Loading Dose):   A cycle is every 21 days:     Trastuzumab   **Always confirm dose/schedule in your pharmacy ordering system**    Patient Characteristics: Preoperative or Nonsurgical Candidate (Clinical Staging), Neoadjuvant Therapy followed by Surgery, Invasive Disease, Chemotherapy, HER2 Positive, ER Negative/Unknown Therapeutic Status: Preoperative or Nonsurgical Candidate (Clinical Staging) AJCC M Category: cM0 AJCC Grade: G3 Breast Surgical Plan: Neoadjuvant Therapy followed by Surgery ER Status: Negative (-) AJCC 8 Stage Grouping: IIIA HER2 Status: Positive (+) AJCC T Category: cT3 AJCC N Category: cN1 PR Status: Negative (-)  Intent of Therapy: Curative Intent, Discussed with Patient

## 2017-03-12 ENCOUNTER — Encounter: Payer: Self-pay | Admitting: *Deleted

## 2017-03-12 ENCOUNTER — Telehealth (HOSPITAL_COMMUNITY): Payer: Self-pay | Admitting: Vascular Surgery

## 2017-03-12 ENCOUNTER — Telehealth: Payer: Self-pay

## 2017-03-12 ENCOUNTER — Telehealth: Payer: Self-pay | Admitting: *Deleted

## 2017-03-12 NOTE — Telephone Encounter (Signed)
Spoke to pt concerning [place] from 5.30.18. Denies questions or concerns regarding dx or treatment care plan. Encourage pt to call with needs. Discussed future appts.

## 2017-03-12 NOTE — Telephone Encounter (Signed)
Called pt to confirm her appointments next week for MRI, chemo education, Bone scans and CT scans. Port placement appt is still pending. Pt wrote down all the times and date. Told pt someone will be calling to let her know about her port appt. Gave pt [facility] address for echo. Pt will need to come back to [facility] to have CT and bone scans done. Pt confirmed time/dates over the phone, as well as address for echo test.

## 2017-03-12 NOTE — Telephone Encounter (Signed)
Left pt message, giving her appt NP brst appt w/ [facility] w/ Echo 03/20/17, asked pt to call back to confirm appt. I will send pt NP packet in mail

## 2017-03-12 NOTE — Telephone Encounter (Signed)
Left vm to discuss [facility] from 5.30.18. Contact information provided.

## 2017-03-14 ENCOUNTER — Ambulatory Visit (HOSPITAL_COMMUNITY): Admission: RE | Admit: 2017-03-14 | Payer: 59 | Source: Ambulatory Visit

## 2017-03-16 ENCOUNTER — Ambulatory Visit (HOSPITAL_COMMUNITY): Payer: 59

## 2017-03-17 ENCOUNTER — Ambulatory Visit (HOSPITAL_COMMUNITY)
Admission: RE | Admit: 2017-03-17 | Discharge: 2017-03-17 | Disposition: A | Discharge status: Home / Self Care | Payer: 59 | Source: Ambulatory Visit | Attending: Oncology | Admitting: Oncology

## 2017-03-17 ENCOUNTER — Encounter: Payer: Self-pay | Admitting: *Deleted

## 2017-03-17 ENCOUNTER — Encounter (HOSPITAL_COMMUNITY): Payer: Self-pay | Admitting: *Deleted

## 2017-03-17 ENCOUNTER — Ambulatory Visit: Payer: Self-pay | Admitting: General Surgery

## 2017-03-17 ENCOUNTER — Other Ambulatory Visit: Payer: 59

## 2017-03-17 DIAGNOSIS — C50412 Malignant neoplasm of upper-outer quadrant of left female breast: Secondary | ICD-10-CM | POA: Insufficient documentation

## 2017-03-17 DIAGNOSIS — C773 Secondary and unspecified malignant neoplasm of axilla and upper limb lymph nodes: Secondary | ICD-10-CM | POA: Diagnosis not present

## 2017-03-17 DIAGNOSIS — Z171 Estrogen receptor negative status [ER-]: Secondary | ICD-10-CM | POA: Diagnosis not present

## 2017-03-17 MED ORDER — GADOBENATE DIMEGLUMINE 529 MG/ML IV SOLN
20.0000 mL | Freq: Once | INTRAVENOUS | Status: AC | PRN
  Administered 2017-03-17: 18 mL via INTRAVENOUS

## 2017-03-17 NOTE — Progress Notes (Signed)
Need orders in epic .  Surgery on 03/18/17.  Thanks.

## 2017-03-18 ENCOUNTER — Ambulatory Visit (HOSPITAL_COMMUNITY): Payer: 59

## 2017-03-18 ENCOUNTER — Encounter (HOSPITAL_COMMUNITY)
Admission: RE | Disposition: A | Discharge status: Home / Self Care | Payer: Self-pay | Source: Ambulatory Visit | Attending: General Surgery

## 2017-03-18 ENCOUNTER — Encounter (HOSPITAL_COMMUNITY): Payer: Self-pay | Admitting: *Deleted

## 2017-03-18 ENCOUNTER — Ambulatory Visit (HOSPITAL_COMMUNITY): Payer: 59 | Admitting: Anesthesiology

## 2017-03-18 ENCOUNTER — Ambulatory Visit (HOSPITAL_COMMUNITY)
Admission: RE | Admit: 2017-03-18 | Discharge: 2017-03-18 | Disposition: A | Discharge status: Home / Self Care | Payer: 59 | Source: Ambulatory Visit | Attending: General Surgery | Admitting: General Surgery

## 2017-03-18 DIAGNOSIS — Z780 Asymptomatic menopausal state: Secondary | ICD-10-CM | POA: Diagnosis not present

## 2017-03-18 DIAGNOSIS — Z95828 Presence of other vascular implants and grafts: Secondary | ICD-10-CM

## 2017-03-18 DIAGNOSIS — Z87891 Personal history of nicotine dependence: Secondary | ICD-10-CM | POA: Insufficient documentation

## 2017-03-18 DIAGNOSIS — Z8249 Family history of ischemic heart disease and other diseases of the circulatory system: Secondary | ICD-10-CM | POA: Insufficient documentation

## 2017-03-18 DIAGNOSIS — Z833 Family history of diabetes mellitus: Secondary | ICD-10-CM | POA: Insufficient documentation

## 2017-03-18 DIAGNOSIS — E7800 Pure hypercholesterolemia, unspecified: Secondary | ICD-10-CM | POA: Diagnosis not present

## 2017-03-18 DIAGNOSIS — Z90710 Acquired absence of both cervix and uterus: Secondary | ICD-10-CM | POA: Insufficient documentation

## 2017-03-18 DIAGNOSIS — Z171 Estrogen receptor negative status [ER-]: Secondary | ICD-10-CM | POA: Diagnosis not present

## 2017-03-18 DIAGNOSIS — C50412 Malignant neoplasm of upper-outer quadrant of left female breast: Secondary | ICD-10-CM | POA: Diagnosis present

## 2017-03-18 HISTORY — PX: PORTACATH PLACEMENT: SHX2246

## 2017-03-18 SURGERY — INSERTION, TUNNELED CENTRAL VENOUS DEVICE, WITH PORT
Anesthesia: General | Site: Chest | Laterality: Right

## 2017-03-18 MED ORDER — LIDOCAINE 2% (20 MG/ML) 5 ML SYRINGE
INTRAMUSCULAR | Status: DC | PRN
  Administered 2017-03-18: 100 mg via INTRAVENOUS

## 2017-03-18 MED ORDER — BUPIVACAINE-EPINEPHRINE 0.25% -1:200000 IJ SOLN
INTRAMUSCULAR | Status: DC | PRN
  Administered 2017-03-18: 20 mL

## 2017-03-18 MED ORDER — DEXAMETHASONE SODIUM PHOSPHATE 10 MG/ML IJ SOLN
INTRAMUSCULAR | Status: DC | PRN
  Administered 2017-03-18: 10 mg via INTRAVENOUS

## 2017-03-18 MED ORDER — DEXAMETHASONE SODIUM PHOSPHATE 10 MG/ML IJ SOLN
INTRAMUSCULAR | Status: AC
  Filled 2017-03-18: qty 1

## 2017-03-18 MED ORDER — OXYCODONE HCL 5 MG PO TABS: 5.0000 mg | ORAL_TABLET | ORAL | 0 refills | Status: DC | PRN

## 2017-03-18 MED ORDER — CEFAZOLIN SODIUM-DEXTROSE 2-4 GM/100ML-% IV SOLN
INTRAVENOUS | Status: AC
  Filled 2017-03-18: qty 100

## 2017-03-18 MED ORDER — CEFAZOLIN SODIUM-DEXTROSE 2-4 GM/100ML-% IV SOLN
2.0000 g | INTRAVENOUS | Status: AC
  Administered 2017-03-18: 2 g via INTRAVENOUS

## 2017-03-18 MED ORDER — FENTANYL CITRATE (PF) 100 MCG/2ML IJ SOLN
INTRAMUSCULAR | Status: DC | PRN
  Administered 2017-03-18 (×2): 25 ug via INTRAVENOUS

## 2017-03-18 MED ORDER — SODIUM CHLORIDE 0.9 % IV SOLN
Freq: Once | INTRAVENOUS | Status: AC
  Administered 2017-03-18: 500 mL
  Filled 2017-03-18: qty 1.2

## 2017-03-18 MED ORDER — FENTANYL CITRATE (PF) 100 MCG/2ML IJ SOLN: 25.0000 ug | INTRAMUSCULAR | Status: DC | PRN

## 2017-03-18 MED ORDER — MIDAZOLAM HCL 2 MG/2ML IJ SOLN
INTRAMUSCULAR | Status: AC
  Filled 2017-03-18: qty 2

## 2017-03-18 MED ORDER — MIDAZOLAM HCL 5 MG/5ML IJ SOLN
INTRAMUSCULAR | Status: DC | PRN
  Administered 2017-03-18: 2 mg via INTRAVENOUS

## 2017-03-18 MED ORDER — ONDANSETRON HCL 4 MG/2ML IJ SOLN: 4.0000 mg | Freq: Four times a day (QID) | INTRAMUSCULAR | Status: DC | PRN

## 2017-03-18 MED ORDER — LACTATED RINGERS IV SOLN
INTRAVENOUS | Status: DC
  Administered 2017-03-18: 08:00:00 via INTRAVENOUS

## 2017-03-18 MED ORDER — ONDANSETRON HCL 4 MG/2ML IJ SOLN
INTRAMUSCULAR | Status: DC | PRN
  Administered 2017-03-18: 4 mg via INTRAVENOUS

## 2017-03-18 MED ORDER — 0.9 % SODIUM CHLORIDE (POUR BTL) OPTIME
TOPICAL | Status: DC | PRN
  Administered 2017-03-18: 1000 mL

## 2017-03-18 MED ORDER — OXYCODONE HCL 5 MG PO TABS: 5.0000 mg | ORAL_TABLET | Freq: Once | ORAL | Status: DC | PRN

## 2017-03-18 MED ORDER — ONDANSETRON HCL 4 MG/2ML IJ SOLN
INTRAMUSCULAR | Status: AC
  Filled 2017-03-18: qty 2

## 2017-03-18 MED ORDER — BUPIVACAINE-EPINEPHRINE (PF) 0.25% -1:200000 IJ SOLN
INTRAMUSCULAR | Status: AC
  Filled 2017-03-18: qty 30

## 2017-03-18 MED ORDER — HEPARIN SOD (PORK) LOCK FLUSH 100 UNIT/ML IV SOLN
INTRAVENOUS | Status: DC | PRN
  Administered 2017-03-18: 500 [IU] via INTRAVENOUS

## 2017-03-18 MED ORDER — LIDOCAINE 2% (20 MG/ML) 5 ML SYRINGE
INTRAMUSCULAR | Status: AC
  Filled 2017-03-18: qty 5

## 2017-03-18 MED ORDER — HEPARIN SOD (PORK) LOCK FLUSH 100 UNIT/ML IV SOLN
INTRAVENOUS | Status: AC
  Filled 2017-03-18: qty 5

## 2017-03-18 MED ORDER — EPHEDRINE SULFATE-NACL 50-0.9 MG/10ML-% IV SOSY
PREFILLED_SYRINGE | INTRAVENOUS | Status: DC | PRN
  Administered 2017-03-18 (×2): 5 mg via INTRAVENOUS

## 2017-03-18 MED ORDER — PROPOFOL 10 MG/ML IV BOLUS
INTRAVENOUS | Status: AC
  Filled 2017-03-18: qty 20

## 2017-03-18 MED ORDER — FENTANYL CITRATE (PF) 100 MCG/2ML IJ SOLN
INTRAMUSCULAR | Status: AC
  Filled 2017-03-18: qty 2

## 2017-03-18 MED ORDER — OXYCODONE HCL 5 MG/5ML PO SOLN: 5.0000 mg | Freq: Once | ORAL | Status: DC | PRN

## 2017-03-18 MED ORDER — PROPOFOL 10 MG/ML IV BOLUS
INTRAVENOUS | Status: DC | PRN
  Administered 2017-03-18: 170 mg via INTRAVENOUS

## 2017-03-18 SURGICAL SUPPLY — 31 items
BAG DECANTER FOR FLEXI CONT (MISCELLANEOUS) ×3 IMPLANT
BENZOIN TINCTURE PRP APPL 2/3 (GAUZE/BANDAGES/DRESSINGS) IMPLANT
BLADE SURG 15 STRL LF DISP TIS (BLADE) ×1 IMPLANT
BLADE SURG 15 STRL SS (BLADE) ×2
CHLORAPREP W/TINT 26ML (MISCELLANEOUS) ×3 IMPLANT
CLOSURE WOUND 1/2 X4 (GAUZE/BANDAGES/DRESSINGS)
COVER SURGICAL LIGHT HANDLE (MISCELLANEOUS) ×3 IMPLANT
DECANTER SPIKE VIAL GLASS SM (MISCELLANEOUS) IMPLANT
DERMABOND ADVANCED (GAUZE/BANDAGES/DRESSINGS) ×2
DERMABOND ADVANCED .7 DNX12 (GAUZE/BANDAGES/DRESSINGS) ×1 IMPLANT
DRAPE C-ARM 42X120 X-RAY (DRAPES) ×3 IMPLANT
DRAPE LAPAROSCOPIC ABDOMINAL (DRAPES) ×3 IMPLANT
ELECT PENCIL ROCKER SW 15FT (MISCELLANEOUS) ×3 IMPLANT
ELECT REM PT RETURN 15FT ADLT (MISCELLANEOUS) ×3 IMPLANT
GAUZE SPONGE 4X4 12PLY STRL (GAUZE/BANDAGES/DRESSINGS) IMPLANT
GAUZE SPONGE 4X4 16PLY XRAY LF (GAUZE/BANDAGES/DRESSINGS) ×3 IMPLANT
GLOVE BIOGEL PI IND STRL 7.5 (GLOVE) ×1 IMPLANT
GLOVE BIOGEL PI INDICATOR 7.5 (GLOVE) ×2
GLOVE ECLIPSE 7.5 STRL STRAW (GLOVE) ×3 IMPLANT
GOWN STRL REUS W/TWL XL LVL3 (GOWN DISPOSABLE) ×6 IMPLANT
KIT BASIN OR (CUSTOM PROCEDURE TRAY) ×3 IMPLANT
KIT POWER CATH 8FR (Port) ×3 IMPLANT
NEEDLE HYPO 22GX1.5 SAFETY (NEEDLE) ×3 IMPLANT
PACK BASIC VI WITH GOWN DISP (CUSTOM PROCEDURE TRAY) ×3 IMPLANT
STRIP CLOSURE SKIN 1/2X4 (GAUZE/BANDAGES/DRESSINGS) IMPLANT
SUT MNCRL AB 4-0 PS2 18 (SUTURE) ×3 IMPLANT
SUT PROLENE 2 0 CT2 30 (SUTURE) ×3 IMPLANT
SYR 10ML ECCENTRIC (SYRINGE) ×3 IMPLANT
SYR CONTROL 10ML LL (SYRINGE) ×3 IMPLANT
TOWEL OR 17X26 10 PK STRL BLUE (TOWEL DISPOSABLE) ×3 IMPLANT
TOWEL OR NON WOVEN STRL DISP B (DISPOSABLE) ×3 IMPLANT

## 2017-03-18 NOTE — Anesthesia Preprocedure Evaluation (Signed)
Anesthesia Evaluation  Patient identified by MRN, date of birth, ID band Patient awake    Reviewed: Allergy & Precautions, H&P , NPO status , Patient's Chart, lab work & pertinent test results  Airway Mallampati: II   Neck ROM: full    Dental   Pulmonary former smoker,    breath sounds clear to auscultation       Cardiovascular negative cardio ROS   Rhythm:regular Rate:Normal     Neuro/Psych    GI/Hepatic   Endo/Other    Renal/GU      Musculoskeletal   Abdominal   Peds  Hematology   Anesthesia Other Findings   Reproductive/Obstetrics                             Anesthesia Physical Anesthesia Plan  ASA: II  Anesthesia Plan: General   Post-op Pain Management:    Induction: Intravenous  PONV Risk Score and Plan: 3 and Ondansetron, Dexamethasone, Propofol, Midazolam and Treatment may vary due to age  Airway Management Planned: LMA  Additional Equipment:   Intra-op Plan:   Post-operative Plan:   Informed Consent: I have reviewed the patients History and Physical, chart, labs and discussed the procedure including the risks, benefits and alternatives for the proposed anesthesia with the patient or authorized representative who has indicated his/her understanding and acceptance.     Plan Discussed with: CRNA, Anesthesiologist and Surgeon  Anesthesia Plan Comments:         Anesthesia Quick Evaluation

## 2017-03-18 NOTE — Op Note (Signed)
Preoperative diagnosis: Cancer of the breast and  poor venous access  Postoperative diagnosis: Same  Procedure: Placement of ClearVue subcutaneous venous port with US guidance  Surgeon: Excell Seltzer M.D.  Anesthesia: LMA general  Description of procedure: Patient is brought to the operating room and placed in the supine position on the operating table. LMA general anesthesia was administered. The entire upper chest and neck were widely sterilely prepped and draped. [place] was used to visualize the right IJ vein. Local anesthesia was used to infiltrate the insertion of and port sites. The right IJ  vein was cannulated under continuous ultrasound visualization with a needle and guidewire without difficulty and position in the superior vena cava was confirmed by fluoroscopy. The introducer was then placed over the guidewire and the flushed catheter placed via the introducer which was stripped away and the tip of the catheter positioned near the cavoatrial junction. Venous pressure backflow was confirmed in the catheter. A small transverse incision was made in the anterior chest wall and subcutaneous pocket created. The catheter was tunneled into the pocket, trimmed to length, and attached to the flushed port which was positioned in the pocket. The port was sutured to the chest wall with interrupted 2-0 Prolene. The incisions were closed with subcutaneous interrupted Monocryl and the skin incisions closed with subcuticular Monocryl and Dermabond. The port was accessed and flushed and aspirated easily and was left flushed with concentrated heparin solution. Sponge needle as the counts were correct. The patient was taken to recovery in good condition.  Maria Hester T  03/18/2017

## 2017-03-18 NOTE — Progress Notes (Signed)
[facility] Counseling Note  Counselor called patient to f/u on our meeting during [facility] last week. LVM with patient, stating that counselor is available if she would like to talk or if she needs any questions answered.  Westly Pam, [place] [facility] [place]

## 2017-03-18 NOTE — Interval H&P Note (Signed)
History and Physical Interval Note:  03/18/2017 8:25 AM  Maria Hester  has presented today for surgery, with the diagnosis of LEFT BREAST CANCER  The various methods of treatment have been discussed with the patient and family. After consideration of risks, benefits and other options for treatment, the patient has consented to  Procedure(s): INSERTION PORT-A-CATH (N/A) as a surgical intervention .  The patient's history has been reviewed, patient examined, no change in status, stable for surgery.  I have reviewed the patient's chart and labs.  Questions were answered to the patient's satisfaction.     [name]

## 2017-03-18 NOTE — H&P (Signed)
History of Present Illness Marland Kitchen T. Earle Troiano MD; 03/11/2017 4:29 PM) The patient is a 51 year old female who presents with breast cancer. She is a post `menopausal female referred by Dr. 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 she repoouter left breastable to feel a mass in her upper outer left breast several weeks ago. Her last mammogram was 2 years ago. She called for imaging but had to see her primary first due to having symptoms. She was examined and referred to the breast center for workup. Subsequent imaging included diagnostic mamogram showing a large area of pleomorphic calcifications and architectural distortion measuring about 7.5 cm in the upper breast with possibly several obscured masses and ultrasound showing a hypoechoic mass 1.4 cm at 11:00 8 cm from the nipple and at the 2:00 position 3 cm from the nipple 2 adjacent masses measuring a total of 0.8 cm. There was also noted to be generally hypoechoic heterogeneous appearing tissue widely throughout the upper outer quadrant. Axillary ultrasound revealed at least 3 prominent abnormal appearing lymph nodes. An ultrasound guided breast biopsy was performed on 03/02/2017 on 2 of the masses at 11:00 and upper outer quadrantand an abnormal lymph node. 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 She is seen now in breast multidisciplinary clinic for initial treatment planning. She has experienced the breast mass as above. Denies skin changes, nipple discharge or inversion. She does not have a personal history of any previous breast problems.  Findings at that time were the following: Tumor size: at least 7.5 cm based on calcifications and distortion cm Tumor grade: 3, Ki-67 35% Estrogen Receptor: negative Progesterone Receptor: negative Her-2 neu: positive Lymph node status:  positive    Past Surgical History Tawni Pummel, RN; 03/11/2017 7:37 AM) Cataract Surgery  Bilateral. Hysterectomy (not due to cancer) - Complete  Oral Surgery   Diagnostic Studies History Tawni Pummel, RN; 03/11/2017 7:37 AM) Colonoscopy  never Mammogram  within last year Pap Smear  1-5 years ago  Medication History Tawni Pummel, RN; 03/11/2017 7:37 AM) Medications Reconciled  Social History Tawni Pummel, RN; 03/11/2017 7:37 AM) Caffeine use  Carbonated beverages, Coffee, Tea. Tobacco use  Current some day smoker.  Family History Tawni Pummel, RN; 03/11/2017 7:37 AM) Diabetes Mellitus  Father. Heart Disease  Father, Mother. Hypertension  Mother.  Pregnancy / Birth History Tawni Pummel, RN; 03/11/2017 7:37 AM) Age at menarche  62 years. Contraceptive History  Oral contraceptives. Gravida  4 Maternal age  33-20 Para  2 Regular periods   Other Problems Tawni Pummel, RN; 03/11/2017 7:37 AM) General anesthesia - complications  Hypercholesterolemia  Thyroid Disease     Review of Systems Sunday Spillers Ledford RN; 03/11/2017 7:37 AM) General Present- Chills, Fatigue, Fever and Night Sweats. Not Present- Appetite Loss, Weight Gain and Weight Loss. Skin Not Present- Change in Wart/Mole, Dryness, Hives, Jaundice, New Lesions, Non-Healing Wounds, Rash and Ulcer. HEENT Present- Wears glasses/contact lenses. Not Present- Earache, Hearing Loss, Hoarseness, Nose Bleed, Oral Ulcers, Ringing in the Ears, Seasonal Allergies, Sinus Pain, Sore Throat, Visual Disturbances and Yellow Eyes. Respiratory Present- Chronic Cough. Not Present- Bloody sputum, Difficulty Breathing, Snoring and Wheezing. Breast Not Present- Breast Mass, Breast Pain, Nipple Discharge and Skin Changes. Cardiovascular Not Present- Chest Pain, Difficulty Breathing Lying Down, Leg Cramps, Palpitations, Rapid Heart Rate, Shortness of Breath and Swelling of Extremities. Gastrointestinal Present-  Indigestion. Not Present- Abdominal Pain,  Bloating, Bloody Stool, Change in Bowel Habits, Chronic diarrhea, Constipation, Difficulty Swallowing, Excessive gas, Gets full quickly at meals, Hemorrhoids, Nausea, Rectal Pain and Vomiting. Female Genitourinary Not Present- Frequency, Nocturia, Painful Urination, Pelvic Pain and Urgency. Musculoskeletal Present- Muscle Pain. Not Present- Back Pain, Joint Pain, Joint Stiffness, Muscle Weakness and Swelling of Extremities. Neurological Not Present- Decreased Memory, Fainting, Headaches, Numbness, Seizures, Tingling, Tremor, Trouble walking and Weakness. Psychiatric Not Present- Anxiety, Bipolar, Change in Sleep Pattern, Depression, Fearful and Frequent crying. Endocrine Not Present- Cold Intolerance, Excessive Hunger, Hair Changes, Heat Intolerance, Hot flashes and New Diabetes. Hematology Not Present- Blood Thinners, Easy Bruising, Excessive bleeding, Gland problems, HIV and Persistent Infections.   Physical Exam Marland Kitchen T. [place] Kretz MD; 03/11/2017 4:31 PM) The physical exam findings are as follows: Note:General: Alert, mildly overweight Caucasian female, in no distress Skin: Warm and dry without rash or infection. HEENT: No palpable masses or thyromegaly. Sclera nonicteric. Pupils equal round and reactive. Oropharynx clear. Lymph nodes: No cervical, supraclavicular, nodes palpable. Breasts: Large firm palpable mass occupying essentially the entire upper outer quadrant of the left breast. Some mild edema of overlying skin and the nipple. No obvious skin involvement or puckering. I cannot feel any palpable axillary lymph nodes. Lungs: Breath sounds clear and equal. No wheezing or increased work of breathing. Cardiovascular: Regular rate and rhythm without murmer. No JVD or edema. Peripheral pulses intact. No carotid bruits. Abdomen: Nondistended. Soft and nontender. No masses palpable. No organomegaly. No palpable hernias. Extremities: No edema or joint  swelling or deformity. No chronic venous stasis changes. Neurologic: Alert and fully oriented. Gait normal. No focal weakness. Psychiatric: Normal mood and affect. Thought content appropriate with normal judgement and insight    Assessment & Plan Marland Kitchen T. Scheryl Sanborn MD; 03/11/2017 4:38 PM) CANCER OF LEFT BREAST, STAGE 3 (C50.912) Impression: 51 year old female with a new diagnosis of cancer of the left breast, upper outer quadrant. Clinical stage IIIa, ER negative, PR negative, HER-2 positive. I reviewed with the patient and her family today the current findings and diagnosis in detail. We discussed all aspects of breast cancer treatment including surgery, radiation, chemotherapy, targeted therapy and hormonal manipulation. We discussed the fact that these treatments are used generally in combination in a different orders based on clinical presentation. Sincll requatient has a locally advanced cancer and will require chemotherapy I beljuve t treatment. an excellent candidate for neoadjuvant treatment. Her case was discussed in the multidisciplinary conference and all agreed on neoadjuva. We discussed thatscussed the rationale for this. We discussed that ultimately surgical planning will depend on her response to chemotherapy but that ibly multiple tumors in the breast sheons and possibly multiple tumors in the breast she may wellrrequent.total mastectomy even after neoadjuvant treatment. All questions were answered and she unders-ndsth placement as the initial step.ssed Port-A-Cath placement as the initial step. I discussed surgery in detail and indications and recovery as well aectiox and DVT.cclusion or displacement, pneumothorax and DVT.cclusion or displacement, pneumothorax and DVT. All questions were answered. Current Plans Port-A-Cath placement under general anesthesia as an outpatient

## 2017-03-18 NOTE — Discharge Instructions (Signed)
    PORT-A-CATH: POST OP INSTRUCTIONS  Always review your discharge instruction sheet given to you by the facility where your surgery was performed.   1. A prescription for pain medication may be given to you upon discharge. Take your pain medication as prescribed, if needed. If narcotic pain medicine is not needed, then you make take acetaminophen (Tylenol) or ibuprofen (Advil) as needed.  2. Take your usually prescribed medications unless otherwise directed. 3. If you need a refill on your pain medication, please contact our office. All narcotic pain medicine now requires a paper prescription.  Phoned in and fax refills are no longer allowed by law.  Prescriptions will not be filled after 5 pm or on weekends.  4. You should follow a light diet for the remainder of the day after your procedure. 5. Most patients will experience some mild swelling and/or bruising in the area of the incision. It may take several days to resolve. 6. It is common to experience some constipation if taking pain medication after surgery. Increasing fluid intake and taking a stool softener (such as Colace) will usually help or prevent this problem from occurring. A mild laxative (Milk of Magnesia or Miralax) should be taken according to package directions if there are no bowel movements after 48 hours.  7. Unless discharge instructions indicate otherwise, you may remove your bandages 48 hours after surgery, and you may shower at that time. You may have steri-strips (small white skin tapes) in place directly over the incision.  These strips should be left on the skin for 7-10 days.  If your surgeon used Dermabond (skin glue) on the incision, you may shower in 24 hours.  The glue will flake off over the next 2-3 weeks.  8. If your port is left accessed at the end of surgery (needle left in port), the dressing cannot get wet and should only by changed by a healthcare professional. When the port is no longer accessed (when the  needle has been removed), follow step 7.   9. ACTIVITIES:  Limit activity involving your arms for the next 72 hours. Do no strenuous exercise or activity for 1 week. You may drive when you are no longer taking prescription pain medication, you can comfortably wear a seatbelt, and you can maneuver your car. 10.You may need to see your doctor in the office for a follow-up appointment.  Please       check with your doctor.  11.When you receive a new Port-a-Cath, you will get a product guide and        ID card.  Please keep them in case you need them.  WHEN TO CALL YOUR DOCTOR ([phone]): 1. Fever over 101.0 2. Chills 3. Continued bleeding from incision 4. Increased redness and tenderness at the site 5. Shortness of breath, difficulty breathing   The clinic staff is available to answer your questions during regular business hours. Please don't hesitate to call and ask to speak to one of the nurses or medical assistants for clinical concerns. If you have a medical emergency, go to the nearest emergency room or call 911.  A surgeon from [facility] is always on call at the hospital.     For further information, please visit [email or web address]      

## 2017-03-18 NOTE — Anesthesia Procedure Notes (Signed)
Procedure Name: LMA Insertion Date/Time: 03/18/2017 8:33 AM Performed by: Danley Danker L Patient Re-evaluated:Patient Re-evaluated prior to inductionOxygen Delivery Method: Circle system utilized Preoxygenation: Pre-oxygenation with 100% oxygen Intubation Type: IV induction Ventilation: Mask ventilation without difficulty LMA: LMA inserted LMA Size: 4.0 Number of attempts: 1 Placement Confirmation: positive ETCO2 and breath sounds checked- equal and bilateral Tube secured with: Tape Dental Injury: Teeth and Oropharynx as per pre-operative assessment

## 2017-03-18 NOTE — Anesthesia Postprocedure Evaluation (Signed)
Anesthesia Post Note  Patient: Maria Hester  Procedure(s) Performed: Procedure(s) (LRB): INSERTION PORT-A-CATH (Right)     Patient location during evaluation: PACU Anesthesia Type: General Level of consciousness: awake and alert and patient cooperative Pain management: pain level controlled Vital Signs Assessment: post-procedure vital signs reviewed and stable Respiratory status: spontaneous breathing and respiratory function stable Cardiovascular status: stable Anesthetic complications: no    Last Vitals:  Vitals:   03/18/17 1030 03/18/17 1055  BP: (!) 102/56 (!) 102/51  Pulse: 63 76  Resp: 15 16  Temp: 36.7 C 36.7 C    Last Pain:  Vitals:   03/18/17 1055  TempSrc: Oral  PainSc: [place]

## 2017-03-18 NOTE — Transfer of Care (Signed)
Immediate Anesthesia Transfer of Care Note  Patient: ANNAKATE SOULIER  Procedure(s) Performed: Procedure(s): INSERTION PORT-A-CATH (Right)  Patient Location: PACU  Anesthesia Type:General  Level of Consciousness: awake  Airway & Oxygen Therapy: Patient Spontanous Breathing and Patient connected to face mask oxygen  Post-op Assessment: Report given to RN and Post -op Vital signs reviewed and stable  Post vital signs: Reviewed and stable  Last Vitals:  Vitals:   03/18/17 0659  BP: (!) 102/58  Pulse: 68  Resp: 18  Temp: 36.8 C    Last Pain:  Vitals:   03/18/17 0659  TempSrc: Oral      Patients Stated Pain Goal: 4 (05/02/81 8833)  Complications: No apparent anesthesia complications

## 2017-03-19 ENCOUNTER — Other Ambulatory Visit: Payer: Self-pay | Admitting: *Deleted

## 2017-03-19 ENCOUNTER — Encounter (HOSPITAL_COMMUNITY): Payer: Self-pay | Admitting: General Surgery

## 2017-03-19 DIAGNOSIS — Z006 Encounter for examination for normal comparison and control in clinical research program: Secondary | ICD-10-CM

## 2017-03-20 ENCOUNTER — Encounter (HOSPITAL_COMMUNITY): Payer: Self-pay

## 2017-03-20 ENCOUNTER — Encounter (HOSPITAL_COMMUNITY)
Admission: RE | Admit: 2017-03-20 | Discharge: 2017-03-20 | Disposition: A | Discharge status: Home / Self Care | Payer: 59 | Source: Ambulatory Visit | Attending: Oncology | Admitting: Oncology

## 2017-03-20 ENCOUNTER — Ambulatory Visit (HOSPITAL_COMMUNITY)
Admission: RE | Admit: 2017-03-20 | Discharge: 2017-03-20 | Disposition: A | Discharge status: Home / Self Care | Payer: 59 | Source: Ambulatory Visit | Attending: Obstetrics and Gynecology | Admitting: Obstetrics and Gynecology

## 2017-03-20 ENCOUNTER — Telehealth: Payer: Self-pay | Admitting: *Deleted

## 2017-03-20 ENCOUNTER — Ambulatory Visit (HOSPITAL_COMMUNITY)
Admission: RE | Admit: 2017-03-20 | Discharge: 2017-03-20 | Disposition: A | Discharge status: Home / Self Care | Payer: 59 | Source: Ambulatory Visit | Attending: Cardiology | Admitting: Cardiology

## 2017-03-20 ENCOUNTER — Ambulatory Visit (HOSPITAL_COMMUNITY)
Admission: RE | Admit: 2017-03-20 | Discharge: 2017-03-20 | Disposition: A | Discharge status: Home / Self Care | Payer: 59 | Source: Ambulatory Visit | Attending: Oncology | Admitting: Oncology

## 2017-03-20 VITALS — BP 113/70 | HR 62 | Wt 191.5 lb

## 2017-03-20 DIAGNOSIS — R918 Other nonspecific abnormal finding of lung field: Secondary | ICD-10-CM | POA: Diagnosis not present

## 2017-03-20 DIAGNOSIS — Z170 Estrogen receptor positive status [ER+]: Secondary | ICD-10-CM | POA: Insufficient documentation

## 2017-03-20 DIAGNOSIS — C50812 Malignant neoplasm of overlapping sites of left female breast: Secondary | ICD-10-CM | POA: Diagnosis present

## 2017-03-20 DIAGNOSIS — R590 Localized enlarged lymph nodes: Secondary | ICD-10-CM | POA: Insufficient documentation

## 2017-03-20 DIAGNOSIS — E785 Hyperlipidemia, unspecified: Secondary | ICD-10-CM | POA: Diagnosis not present

## 2017-03-20 DIAGNOSIS — Z87891 Personal history of nicotine dependence: Secondary | ICD-10-CM | POA: Diagnosis not present

## 2017-03-20 DIAGNOSIS — C50412 Malignant neoplasm of upper-outer quadrant of left female breast: Secondary | ICD-10-CM

## 2017-03-20 DIAGNOSIS — K449 Diaphragmatic hernia without obstruction or gangrene: Secondary | ICD-10-CM | POA: Diagnosis not present

## 2017-03-20 DIAGNOSIS — Z171 Estrogen receptor negative status [ER-]: Secondary | ICD-10-CM | POA: Insufficient documentation

## 2017-03-20 LAB — ECHOCARDIOGRAM COMPLETE
CHL CUP DOP CALC LVOT VTI: 23 cm
CHL CUP MV DEC (S): 227
EERAT: 6.17
EWDT: 227 ms
FS: 33 % (ref 28–44)
IVS/LV PW RATIO, ED: 0.92
LA ID, A-P, ES: 36 mm
LA diam end sys: 36 mm
LA vol A4C: 45.1 ml
LA vol index: 23.8 mL/m2
LADIAMINDEX: 1.86 cm/m2
LAVOL: 46.2 mL
LV E/e' medial: 6.17
LV TDI E'LATERAL: 14.5
LVEEAVG: 6.17
LVELAT: 14.5 cm/s
LVOT area: 3.46 cm2
LVOT diameter: 21 mm
LVOT peak grad rest: 3 mmHg
LVOTPV: 90.7 cm/s
LVOTSV: 80 mL
MV pk A vel: 52.3 m/s
MVPG: 3 mmHg
MVPKEVEL: 89.5 m/s
PW: 8.88 mm — AB (ref 0.6–1.1)
RV LATERAL S' VELOCITY: 11.9 cm/s
RV TAPSE: 22.1 mm
TDI e' medial: 10.8
Weight: 3064 oz

## 2017-03-20 MED ORDER — IOPAMIDOL (ISOVUE-300) INJECTION 61%
INTRAVENOUS | Status: AC
  Filled 2017-03-20: qty 75

## 2017-03-20 MED ORDER — IOPAMIDOL (ISOVUE-300) INJECTION 61%
75.0000 mL | Freq: Once | INTRAVENOUS | Status: AC | PRN
Start: 2017-03-20 — End: 2017-03-20
  Administered 2017-03-20: 75 mL via INTRAVENOUS

## 2017-03-20 MED ORDER — TECHNETIUM TC 99M MEDRONATE IV KIT
21.8000 | PACK | Freq: Once | INTRAVENOUS | Status: AC | PRN
  Administered 2017-03-20: 21.8 via INTRAVENOUS

## 2017-03-20 NOTE — Progress Notes (Signed)
  Echocardiogram 2D Echocardiogram has been performed.  Johny Chess 03/20/2017, 9:00 AM

## 2017-03-20 NOTE — Telephone Encounter (Signed)
This RN received called report from [facility] per reading on CT of pneumonia.  Call placed to pt to inquire of symptoms per above.  Maria Hester states she is under treatment for known chest congestion with an antibiotic and cough decongestant.  She denies any fevers " and we have been monitoring since I had the port placed on Wednesday"  Per above this RN advised pt no further medication interventions needed but did advise pt on breathing techniques to enhance reabsorption of fluid in her lungs.  Maria Hester verbalized understanding of above. No other questions or concerns at this time.  This note will be given to MD with report for any additional orders.  Pt is scheduled for follow up appointment with MD on 6/15.

## 2017-03-21 NOTE — Addendum Note (Signed)
Encounter addended by: Larey Dresser, MD on: 03/21/2017 10:39 PM<BR>    Actions taken: Sign clinical note, Visit diagnoses modified, LOS modified

## 2017-03-21 NOTE — Progress Notes (Signed)
Oncology: Dr. Jana Hakim  51 yo with history of hyperlipidemia was recently diagnosed with breast cancer and was referred for cardio-oncology evaluation.  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  She will have neadjuvant chemotherapy with carboplatin/Taxotere + trastuzumab/pertuzumab x 6 cycles starting 03/31/17. She will then continue trastuzumab for a year. She will have surgery and adjuvant radiation.   She recently quit smoking.  She has no history of heart disease.  Her mother did have CHF, ?viral myocarditis.  She is generally doing well symptomatically.  No chest pain.  No exertional dyspnea.  No lightheadedness/syncope.   PMH: 1. Hyperlipidemia 2. 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  She will have neadjuvant chemotherapy with carboplatin/Taxotere + trastuzumab/pertuzumab x 6 cycles starting 03/31/17. She will then continue trastuzumab for a year. She will have surgery and adjuvant radiation.  - Echo (6/18): EF 55%, GLS -18.9%.   FH: Mother with possible viral myocarditis and CHF, father with MI.   Social History   Social History  . Marital status: Married    Spouse name: N/A  . Number of children: N/A  . Years of education: N/A   Occupational History  . Not on file.   Social History Main Topics  . Smoking status: Former Smoker    Packs/day: 0.50    Years: 34.00    Types: Cigarettes  . Smokeless tobacco: Never Used  . Alcohol use No     Comment: social drink   . Drug use: No  . Sexual activity: Yes    Birth control/ protection: Surgical     Comment: hyst   Other Topics Concern  . Not on file   Social History Narrative  . No narrative on file   ROS: All systems reviewed and negative except as per HPI.   Current Outpatient Prescriptions  Medication Sig Dispense Refill  . ALPRAZolam (XANAX) 0.5 MG tablet TAKE HALF TO 1 TABLET BY MOUTH EVERY 8 HOURS AS NEEDED FOR ANXIETY  2  . cephALEXin (KEFLEX) 500 MG capsule Take 500 mg by mouth 3 (three) times daily.    .  Hydrocodone-Chlorpheniramine 5-4 MG/5ML SOLN Take 5 mLs by mouth every 12 (twelve) hours as needed.    Marland Kitchen oxyCODONE (OXY IR/ROXICODONE) 5 MG immediate release tablet Take 1 tablet (5 mg total) by mouth every 4 (four) hours as needed (for pain score of 1-4). (Patient not taking: Reported on 03/20/2017) 10 tablet 0   No current facility-administered medications for this encounter.    BP 113/70   Pulse 62   Wt 191 lb 8 oz (86.9 kg)   LMP 02/11/2014   SpO2 98%   BMI 31.38 kg/m ' General: NAD Neck: No JVD, no thyromegaly or thyroid nodule.  Lungs: Occasional rhonchi. CV: Nondisplaced PMI.  Heart regular S1/S2, no S3/S4, no murmur.  No peripheral edema.  No carotid bruit.  Normal pedal pulses.  Abdomen: Soft, nontender, no hepatosplenomegaly, no distention.  Skin: Intact without lesions or rashes.  Neurologic: Alert and oriented x 3.  Psych: Normal affect. Extremities: No clubbing or cyanosis.  HEENT: Normal.   Assessment/Plan: 51 yo with newly diagnosed breast cancer.  She will get Herceptin for a year. We discussed the cardiotoxic risk of Herceptin and the rationale behind echo screening.  She had a baseline echo today that I reviewed personally.  EF is normal and strain pattern is normal.  She will be starting chemotherapy on 03/31/17.  I will have her get a repeat echo in 3 months with office followup.   Loralie Champagne 03/21/2017

## 2017-03-24 ENCOUNTER — Other Ambulatory Visit: Payer: Self-pay | Admitting: *Deleted

## 2017-03-24 DIAGNOSIS — C50812 Malignant neoplasm of overlapping sites of left female breast: Secondary | ICD-10-CM

## 2017-03-24 DIAGNOSIS — Z171 Estrogen receptor negative status [ER-]: Principal | ICD-10-CM

## 2017-03-26 NOTE — Progress Notes (Signed)
[place]  [phone] [phone]) [phone]     ID: KYMBERLEY RAZ DOB: 20-Apr-1966  MR#: [id number]  OEV#:[id number]  Patient Care Team: [name], MD as PCP - General (Obstetrics and Gynecology) Excell Seltzer, MD as Consulting Physician (General Surgery) Sarai January, Virgie Dad, MD as Consulting Physician (Oncology) Eppie Gibson, MD as Attending Physician (Radiation Oncology) Tommie Sams, MD as Referring Physician (Internal Medicine) Chauncey Cruel, MD OTHER MD:  CHIEF COMPLAINT: HER-2 positive estrogen receptor negative breast cancer  CURRENT TREATMENT: Neoadjuvant chemotherapy    INTERVAL HISTORY: Karlina returns today for follow-up and treatment of her estrogen receptor negative, HER-2 positive breast cancer accompanied by her husband and sister. Since her last visit here Star[place] was staged with a chest CT scan and bone scan, 03/20/2017. The chest CT scan showed airspace changes consistent with recent pneumonia. There was some adenopathy which may be related. There was a multinodular goiter. There was no evidence of metastatic disease. Bone scan showed only arthropathy in the feet.  On 03/18/2017 the patient underwent port placement.. On June 8 she underwent echocardiogram which showed an ejection fraction in the 55% range.  She is here today to discuss her upcoming treatment, to start next week.   REVIEW OF SYSTEMS: She had no difficulty with the port and particularly no pain issues. She is comfortable palpating it. She has mild sinus problems. Her cough is almost completely resolved. She never did have a fever and she did receive antibiotics for her prior pneumonia. A detailed review of systems today was otherwise stable   BREAST CANCER HISTORY: From the original intake note:  [name] herself noted a change in her left breast when looking into a mirror and then palpating a mass in the upper-outer quadrant. She brought this to medical  attention and on 02/24/2017 underwent bilateral diagnostic mammography with tomography and left breast ultrasonography at the breast Center. The breast density was category C. In the upper portion of the left breast there was a broad area of distortion measuring up to 7.5 cm. On exam there was a broad firm area involving the upper outer quadrant of the left breast which was visibly protruding. By ultrasound at the 11:00 axis 8 cm from the nipple there was an irregular hypoechoic mass measuring 1.4 cm with additiona ases at 2:00, 3 cm from the nipple and multiple other masses as described, all in the upper-outer quadrant primarily. Ultrasound of the left axilla showed at least 3 prominent lymph nodes one of which had a cortical bulge.  On 03/02/2017 the patient underwent biopsy of a left breast mass at the 11:30 o'clock position and a second mass described as upper outer quadrant as well as one of the suspicious left axillary lymph nodes. These all showed invasive ductal carcinoma, grade 3. Prognostic panel from one of the 2 masses showed the tumor to be estrogen and progesterone receptor negative, but HER-2 amplified, with a signals ratio of 8.24 and the number per cell 15.65.  Her subsequent history is as detailed below   PAST MEDICAL HISTORY: Past Medical History:  Diagnosis Date  . Breast cancer ([place])   . 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 Complication of anesthesia    pt states BP tends to drop and has a hard time waking up - pt was told by prior anesthesiologist to make sure this was made known for furture surgeries  . Uterine fibroid  PAST SURGICAL HISTORY: Past Surgical History:  Procedure Laterality Date  . ABDOMINAL HYSTERECTOMY    . ABDOMINOPLASTY N/A 02/28/2014   Procedure: ABDOMINOPLASTY with Panniculectomy;  Surgeon: [name], MD;  Location: [facility];  Service: Gynecology;  Laterality: N/A;  . APPENDECTOMY    .  BILATERAL SALPINGECTOMY Bilateral 02/28/2014   Procedure: BILATERAL SALPINGECTOMY;  Surgeon: [name], MD;  Location: [facility];  Service: Gynecology;  Laterality: Bilateral;  . CATARACT EXTRACTION, BILATERAL    . PORTACATH PLACEMENT Right 03/18/2017   Procedure: INSERTION PORT-A-CATH;  Surgeon: Excell Seltzer, MD;  Location: [facility];  Service: General;  Laterality: Right;  . reverse tubal ligation    . SCAR REVISION N/A 02/28/2014   Procedure: SCAR REVISION;  Surgeon: [name], MD;  Location: [facility];  Service: Gynecology;  Laterality: N/A;  . SUPRACERVICAL ABDOMINAL HYSTERECTOMY N/A 02/28/2014   Procedure: HYSTERECTOMY SUPRACERVICAL ABDOMINAL;  Surgeon: [name], MD;  Location: [facility];  Service: Gynecology;  Laterality: N/A;  . TUBAL LIGATION    . WISDOM TOOTH EXTRACTION      FAMILY HISTORY Family History  Problem Relation Age of Onset  . Heart disease Mother   . Hypertension Mother   . Diabetes Father   . Heart disease Father   . Cancer Maternal Grandmother        intestines  . Heart disease Maternal Grandmother   . Heart disease Maternal Grandfather   . Cancer Maternal Grandfather        esophageal  . Diabetes Paternal Grandmother   . Heart disease Paternal Grandmother   . Kidney cancer Paternal Grandmother   . Tuberculosis Paternal Grandfather   . Heart disease Paternal Grandfather   . Melanoma Paternal Grandfather   . Breast cancer Maternal Aunt   . Breast cancer Cousin   The patient's father died from heart disease at age 66. The patient's mother is living at age 14. The patient has one brother, 2 sisters. On the mother's side and aunt had breast cancer at age 58 and her daughter had breast cancer in her early 39s. There is also a grandfather with esophageal cancer and a grandmother with colon cancer. On the paternal side there is a grandmother with kidney cancer at an early age and a grandfather with melanoma at an early age.  GYNECOLOGIC HISTORY:    Patient's last menstrual period was 02/11/2014.  Menarche age 49, first live birth age 13, the patient is [place] P2. She underwent hysterectomy without salpingo-oophorectomy May 2016. She did not use hormone replacement. She did use oral contraceptives approximately 10 years in the 1980s.  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 Her husband Fish farm manager in the same [place], which is a 10% aviation enrollment. Daughter Lorenda Hatchet is a hairstylist in [place] and son Chip Boer is a Physiological scientist in [place] the patient has 2 grandsons and one granddaughter. She attends a [employer]    ADVANCED DIRECTIVES: The patient has completed advanced directives and will have the motorized at the next visit   HEALTH MAINTENANCE: Social History  Substance Use Topics  . Smoking status: Former Smoker    Packs/day: 0.50    Years: 34.00    Types: Cigarettes  . Smokeless tobacco: Never Used  . Alcohol use No     Comment: social drink      Colonoscopy:No  PAP: 03/04/2017  Bone density: No   No Known Allergies  Current  Outpatient Prescriptions  Medication Sig Dispense Refill  . ALPRAZolam (XANAX) 0.5 MG tablet TAKE HALF TO 1 TABLET BY MOUTH EVERY 8 HOURS AS NEEDED FOR ANXIETY  2  . cephALEXin (KEFLEX) 500 MG capsule Take 500 mg by mouth 3 (three) times daily.    Marland Kitchen dexamethasone (DECADRON) 4 MG tablet Take 2 tablets (8 mg total) by mouth 2 (two) times daily. Start the day before Taxotere. Then again the day after chemo for 3 days. 30 tablet 1  . Hydrocodone-Chlorpheniramine 5-4 MG/5ML SOLN Take 5 mLs by mouth every 12 (twelve) hours as needed.    . lidocaine-prilocaine (EMLA) cream Apply to affected area once 30 g 3  . LORazepam (ATIVAN) 0.5 MG tablet Take 1 tablet (0.5 mg total) by mouth at bedtime as needed (Nausea or vomiting). 30 tablet 0  . oxyCODONE (OXY IR/ROXICODONE) 5 MG immediate release tablet Take 1 tablet  (5 mg total) by mouth every 4 (four) hours as needed (for pain score of 1-4). (Patient not taking: Reported on 03/20/2017) 10 tablet 0  . prochlorperazine (COMPAZINE) 10 MG tablet Take 1 tablet (10 mg total) by mouth every 6 (six) hours as needed (Nausea or vomiting). 30 tablet 1   No current facility-administered medications for this visit.     OBJECTIVE: Middle-aged white woman In no acute distress  Vitals:   03/27/17 1434  BP: (!) 109/59  Pulse: 73  Resp: 20  Temp: 98.1 F (36.7 C)     Body mass index is 31.24 kg/m.    ECOG FS:0 - Asymptomatic  Sclerae unicteric, pupils round and equal Oropharynx clear and moist No cervical or supraclavicular adenopathy Lungs no rales or rhonchi Heart regular rate and rhythm Abd soft, nontender, positive bowel sounds MSK no focal spinal tenderness, no upper extremity lymphedema Neuro: nonfocal, well oriented, appropriate affect Breasts: The right breast is benign. The left breast there is a large area of induration chiefly in the upper portion. This is movable. There is no associated erythema. There is no nipple retraction. Both axillae are benign.  LAB RESULTS:  CMP     Component Value Date/Time   NA 140 03/11/2017 1236   K 4.1 03/11/2017 1236   CL 106 03/01/2014 0607   CO2 28 03/11/2017 1236   GLUCOSE 127 03/11/2017 1236   BUN 11.4 03/11/2017 1236   CREATININE 0.8 03/11/2017 1236   CALCIUM 9.6 03/11/2017 1236   PROT 6.9 03/11/2017 1236   ALBUMIN 3.2 (L) 03/11/2017 1236   AST 13 03/11/2017 1236   ALT 12 03/11/2017 1236   ALKPHOS 86 03/11/2017 1236   BILITOT 0.33 03/11/2017 1236   GFRNONAA >90 03/01/2014 0607   GFRAA >90 03/01/2014 0607    No results found for: TOTALPROTELP, ALBUMINELP, A1GS, A2GS, BETS, BETA2SER, GAMS, MSPIKE, SPEI  No results found for: Nils Pyle, [facility]  Lab Results  Component Value Date   WBC 9.6 03/11/2017   NEUTROABS 6.3 03/11/2017   HGB 13.2 03/11/2017   HCT 39.2 03/11/2017    MCV 87.9 03/11/2017   PLT 361 03/11/2017      Chemistry      Component Value Date/Time   NA 140 03/11/2017 1236   K 4.1 03/11/2017 1236   CL 106 03/01/2014 0607   CO2 28 03/11/2017 1236   BUN 11.4 03/11/2017 1236   CREATININE 0.8 03/11/2017 1236      Component Value Date/Time   CALCIUM 9.6 03/11/2017 1236   ALKPHOS 86 03/11/2017 1236   AST 13 03/11/2017 1236  ALT 12 03/11/2017 1236   BILITOT 0.33 03/11/2017 1236       No results found for: LABCA2  No components found for: OVFIEP329  No results for input(s): INR in the last 168 hours.  Urinalysis    Component Value Date/Time   COLORURINE YELLOW 02/23/2014 1004   APPEARANCEUR CLEAR 02/23/2014 1004   LABSPEC 1.025 02/23/2014 1004   PHURINE 5.5 02/23/2014 1004   GLUCOSEU NEGATIVE 02/23/2014 1004   HGBUR NEGATIVE 02/23/2014 1004   BILIRUBINUR NEGATIVE 02/23/2014 1004   KETONESUR NEGATIVE 02/23/2014 1004   PROTEINUR NEGATIVE 02/23/2014 1004   UROBILINOGEN 0.2 02/23/2014 1004   NITRITE NEGATIVE 02/23/2014 1004   LEUKOCYTESUR NEGATIVE 02/23/2014 1004     STUDIES: Ct Chest W Contrast  Result Date: 03/20/2017 CLINICAL DATA:  Breast carcinoma left-side EXAM: CT CHEST WITH CONTRAST TECHNIQUE: Multidetector CT imaging of the chest was performed during intravenous contrast administration. CONTRAST:  4m ISOVUE-300 IOPAMIDOL (ISOVUE-300) INJECTION 61% COMPARISON:  Chest radiograph March 18, 2017 FINDINGS: Cardiovascular: There is no thoracic aortic aneurysm or dissection. The visualized great vessels appear normal. There is no appreciable pericardial thickening. No major vessel pulmonary embolus evident. Port-A-Cath tip is in the superior vena cava. Mediastinum/Nodes: There is mild inhomogeneity in the left lobe of the thyroid without dominant mass. There is a lymph node in the right hilum measuring 1.3 x 1.1 cm. There is adenopathy in the sub- carinal region measuring 2.2 x 1.8 cm. This adenopathy is best appreciable on the  coronal reformats. There is a subcentimeter lymph node in the left hilum. There is a lymph node anterior to the carina measuring 1.4 x 1.0 cm. There is a small hiatal hernia. Lungs/Pleura: There is airspace consolidation in portions of the medial and posterior segments of the right lower lobe. There is no parenchymal nodular lesion in the lungs. No pleural effusion or pleural thickening. Upper Abdomen: Visualized upper abdominal structures appear unremarkable. Musculoskeletal: There are no blastic or lytic bone lesions. No chest wall lesions are appreciable on this study. IMPRESSION: Airspace consolidation in portions of the medial and posterior segments of the right lower lobe, consistent with pneumonia. No parenchymal lung mass. Adenopathy, most notably in the sub- carinal region. Correlation with nuclear medicine PET study to assess degree of metabolic activity may be warranted in this circumstance. Probable multinodular goiter without dominant thyroid mass. Additional imaging surveillance not felt to be warranted based on consensus guidelines. Small hiatal hernia. These results will be called to the ordering clinician or representative by the Radiologist Assistant, and communication documented in the PACS or zVision Dashboard. Electronically Signed   By: WLowella GripIII M.D.   On: 03/20/2017 11:11   Nm Bone Scan Whole Body  Result Date: 03/20/2017 CLINICAL DATA:  Breast carcinoma EXAM: NUCLEAR MEDICINE WHOLE BODY BONE SCAN TECHNIQUE: Whole body anterior and posterior images were obtained approximately 3 hours after intravenous injection of radiopharmaceutical. RADIOPHARMACEUTICALS:  21.8 mCi Technetium-92mDP IV COMPARISON:  None. FINDINGS: There is slight increased uptake in the hindfoot regions, likely of arthropathic etiology. Elsewhere the distribution of uptake is unremarkable. There are no findings are felt to be indicative of bony metastatic disease. Kidneys are noted in the flank positions  bilaterally. IMPRESSION: No demonstrable bony metastatic disease. Probable arthropathy in each hindfoot region. Electronically Signed   By: WiLowella GripII M.D.   On: 03/20/2017 14:11   Mr Breast Bilateral W Wo Contrast  Result Date: 03/18/2017 CLINICAL DATA:  Left breast recently biopsied grade lll invasive ductal  carcinoma and ductal carcinoma in-situ with lymphovascular invasion at 11:30, grade lll ductal carcinoma with ductal carcinoma in-situ with calcifications in the upper outer quadrant and metastatic carcinoma in a left axillary lymph node. LABS:  None obtained at the time of imaging. EXAM: BILATERAL BREAST MRI WITH AND WITHOUT CONTRAST TECHNIQUE: Multiplanar, multisequence MR images of both breasts were obtained prior to and following the intravenous administration of 20 ml of MultiHance. THREE-DIMENSIONAL MR IMAGE RENDERING ON INDEPENDENT WORKSTATION: Three-dimensional MR images were rendered by post-processing of the original MR data on an independent workstation. The three-dimensional MR images were interpreted, and findings are reported in the following complete MRI report for this study. Three dimensional images were evaluated at the independent DynaCad workstation COMPARISON:  Previous exam(s). FINDINGS: Breast composition: c. Heterogeneous fibroglandular tissue. Background parenchymal enhancement: Marked. There is marked bilateral enhancement lowering the sensitivity of the MRI. Right breast: No mass or abnormal enhancement. Left breast: There is asymmetric spiculated enhancement in the superior aspect of the left breast measuring 5.1 x 2.5 x 3.0 cm in transverse, anterior posterior and craniocaudal dimension. This corresponds to the areas of recently biopsied malignancies. There are several enhancing nodules. There is an enhancing nodule in the upper-inner quadrant of the middle third of the left breast measuring 8 mm. There is a 4 mm enhancing nodule in the central aspect of the breast.  Lymph nodes: There are several abnormal appearing left axillary lymph nodes. The largest lymph node measures 2.1 cm with a focal area of thickening and biopsy change corresponding with the known metastatic disease. Ancillary findings:  None. IMPRESSION: Markedly bilateral enhancement of both breasts lowering the sensitivity of MRI. Asymmetric spiculated enhancement in the superior aspect of the left breast measuring 5.1 x 2.5 x 3.0 cm corresponding to the known areas of malignancy. Other enhancing nodules are seen in the left breast. Abnormal left axillary adenopathy. RECOMMENDATION: Treatment planning of the known left breast cancer and axillary metastasis is recommended. BI-RADS CATEGORY  6: Known biopsy-proven malignancy. Electronically Signed   By: Lillia Mountain M.D.   On: 03/18/2017 10:36   Dg Chest Port 1 View  Result Date: 03/18/2017 CLINICAL DATA:  Port-A-Cath placement. EXAM: PORTABLE CHEST 1 VIEW COMPARISON:  No recent prior. FINDINGS: Port-A-Cath noted with lead tip over the superior vena cava. Heart size stable. Mild basilar atelectasis and/or infiltrates. No pleural effusion or pneumothorax . IMPRESSION: 1. Port-A-Cath noted with tip projected superior vena cava. 2. Mild basilar atelectasis and/or infiltrates. Electronically Signed   By: Marcello Moores  Register   On: 03/18/2017 10:09   Dg C-arm 1-60 Min-no Report  Result Date: 03/18/2017 Fluoroscopy was utilized by the requesting physician.  No radiographic interpretation.   [place] Axillary Node Core Biopsy Left  Addendum Date: 03/03/2017   ADDENDUM REPORT: 03/03/2017 11:52 ADDENDUM: Pathology revealed grade III invasive ductal carcinoma and ductal carcinoma in situ with lymphovascular invasion in the left breast at 11:30, grade III invasive ductal carcinoma with ductal carcinoma in situ with calcifications in the left upper outer quadrant and metastatic carcinoma in the left axillary lymph node. This was found to be concordant by Dr. Ammie Ferrier.  Pathology results were discussed with the patient by telephone. The patient reported doing well after the biopsies with tenderness at the sites. Post biopsy instructions and care were reviewed and questions were answered. The patient was encouraged to call [facility] for any additional concerns. The patient was referred to the [facility] at the  [facility] on Mar 11, 2017. If breast conservation is being considered, a breast MRI or additional biopsies should be considered. Pathology results reported by Susa Raring RN, BSN on 03/03/2017. Electronically Signed   By: Ammie Ferrier M.D.   On: 03/03/2017 11:52   Result Date: 03/03/2017 CLINICAL DATA:  51 year old female presenting for ultrasound-guided biopsy of a left breast mass and left axillary lymph node. EXAM: ULTRASOUND GUIDED LEFT BREAST CORE NEEDLE BIOPSY COMPARISON:  Previous exam(s). FINDINGS: I met with the patient and we discussed the procedure of ultrasound-guided biopsy, including benefits and alternatives. We discussed the high likelihood of a successful procedure. We discussed the risks of the procedure, including infection, bleeding, tissue injury, clip migration, and inadequate sampling. Informed written consent was given. The usual time-out protocol was performed immediately prior to the procedure. Lesion quadrant: Upper-inner Using sterile technique and 1% Lidocaine as local anesthetic, under direct ultrasound visualization, a 14 gauge spring-loaded device was used to perform biopsy of a left breast mass at 11:30 using a lateral approach. At the conclusion of the procedure a ribbon shaped tissue marker clip was deployed into the biopsy cavity. Lesion quadrant: Left axilla Using sterile technique and 1% Lidocaine as local anesthetic, under direct ultrasound visualization, a 14 gauge spring-loaded device was used to perform biopsy of a left axillary lymph node using an  inferior approach. At the conclusion of the procedure a spiral shaped tissue marker clip was deployed into the biopsy cavity. Follow up 2 view mammogram was performed and dictated separately. IMPRESSION: 1. Ultrasound guided biopsy of left breast mass 1130. No apparent complications. 2. Ultrasound-guided biopsy of a left axillary lymph node. No apparent complications. Electronically Signed: By: Ammie Ferrier M.D. On: 03/02/2017 09:25   Mm Clip Placement Left  Result Date: 03/02/2017 CLINICAL DATA:  Post biopsy mammogram of the left breast for clip placement. EXAM: DIAGNOSTIC LEFT MAMMOGRAM POST ULTRASOUND AND STEREOTACTIC BIOPSIES COMPARISON:  Previous exam(s). FINDINGS: Mammographic images were obtained following ultrasound-guided biopsy of a left breast mass and left axillary lymph node and stereotactic guided biopsy of left breast calcifications. A ribbon shaped biopsy marking clip from the ultrasound-guided biopsy is positioned at the site of the biopsied mass at 11:30 in the left breast. The X shaped biopsy marking clip from the stereotactic biopsy is positioned in the upper-outer quadrant of the left breast, middle depth. The clip is located approximately 4.2 cm away from the ribbon shaped biopsy marking clip in the craniocaudal view. Note however, that the calcifications in the left breast span approximately 7 cm of tissue. The spiral shaped biopsy marking clip in the left axillary lymph node is not visualized on the images. IMPRESSION: 1. The ribbon shaped biopsy marking clip is appropriately positioned at the site of the biopsied mass under ultrasound guidance at 11:30. 2. The X shaped biopsy marking clip is appropriately positioned at the site of the biopsied calcifications in the upper-outer quadrant of the left breast. 3. The spiral shaped biopsy marking clip in the left axillary lymph node is not visualized on these images. 4. The 2 clips in the left breast are approximately 4.2 cm apart. Note  that the span of calcifications is approximately 7 cm. Final Assessment: Post Procedure Mammograms for Marker Placement Electronically Signed   By: Ammie Ferrier M.D.   On: 03/02/2017 09:32   Mm Lt Breast Bx W Loc Dev 1st Lesion Image Bx Spec Stereo Guide  Addendum Date: 03/03/2017   ADDENDUM REPORT: 03/03/2017 11:53 ADDENDUM:  Pathology revealed grade III invasive ductal carcinoma and ductal carcinoma in situ with lymphovascular invasion in the left breast at 11:30, grade III invasive ductal carcinoma with ductal carcinoma in situ with calcifications in the left upper outer quadrant and metastatic carcinoma in the left axillary lymph node. This was found to be concordant by Dr. Ammie Ferrier. Pathology results were discussed with the patient by telephone. The patient reported doing well after the biopsies with tenderness at the sites. Post biopsy instructions and care were reviewed and questions were answered. The patient was encouraged to call [facility] for any additional concerns. The patient was referred to the [facility] at the [facility] on Mar 11, 2017. If breast conservation is being considered, a breast MRI or additional biopsies should be considered. Pathology results reported by Susa Raring RN, BSN on 03/03/2017. Electronically Signed   By: Ammie Ferrier M.D.   On: 03/03/2017 11:53   Result Date: 03/03/2017 CLINICAL DATA:  51 year old female presenting for stereotactic biopsy of left breast calcifications. EXAM: LEFT BREAST STEREOTACTIC CORE NEEDLE BIOPSY COMPARISON:  Previous exams. FINDINGS: The patient and I discussed the procedure of stereotactic-guided biopsy including benefits and alternatives. We discussed the high likelihood of a successful procedure. We discussed the risks of the procedure including infection, bleeding, tissue injury, clip migration, and inadequate sampling. Informed written consent  was given. The usual time out protocol was performed immediately prior to the procedure. Using sterile technique and 1% Lidocaine as local anesthetic, under stereotactic guidance, a 9 gauge vacuum assisted device was used to perform core needle biopsy of calcifications in the upper-outer quadrant of the left breast using a superior approach. Specimen radiograph was performed showing calcifications within all of the core samples. Specimens with calcifications are identified for pathology. Lesion quadrant: Upper-outer At the conclusion of the procedure, a X shaped tissue marker clip was deployed into the biopsy cavity. Follow-up 2-view mammogram was performed and dictated separately. IMPRESSION: Stereotactic-guided biopsy of calcifications in the upper-outer quadrant of the left breast. No apparent complications. Electronically Signed: By: Ammie Ferrier M.D. 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 This was found to be concordant by Dr. Ammie Ferrier. Pathology results were discussed with the patient by telephone. The patient reported doing well after the biopsies with tenderness at the sites. Post biopsy instructions and care were reviewed and questions were answered. The patient was encouraged to call [facility] for any additional concerns. The patient was referred to the [facility] at the [facility] on Mar 11, 2017. If breast conservation is being considered, a breast MRI or additional biopsies should  be considered. Pathology results reported by Susa Raring RN, BSN on 03/03/2017. Electronically Signed   By: Ammie Ferrier M.D.   On: 03/03/2017 11:52   Result Date: 03/03/2017 CLINICAL DATA:  51 year old female presenting for ultrasound-guided biopsy of a left breast mass and left axillary lymph node. EXAM: ULTRASOUND GUIDED LEFT BREAST CORE NEEDLE BIOPSY COMPARISON:  Previous exam(s). FINDINGS:  I met with the patient and we discussed the procedure of ultrasound-guided biopsy, including benefits and alternatives. We discussed the high likelihood of a successful procedure. We discussed the risks of the procedure, including infection, bleeding, tissue injury, clip migration, and inadequate sampling. Informed written consent was given. The usual time-out protocol was performed immediately prior to the procedure. Lesion quadrant: Upper-inner Using sterile technique and 1% Lidocaine as local anesthetic, under direct ultrasound visualization, a 14 gauge spring-loaded device was used to perform biopsy of a left breast mass at 11:30 using a lateral approach. At the conclusion of the procedure a ribbon shaped tissue marker clip was deployed into the biopsy cavity. Lesion quadrant: Left axilla Using sterile technique and 1% Lidocaine as local anesthetic, under direct ultrasound visualization, a 14 gauge spring-loaded device was used to perform biopsy of a left axillary lymph node using an inferior approach. At the conclusion of the procedure a spiral shaped tissue marker clip was deployed into the biopsy cavity. Follow up 2 view mammogram was performed and dictated separately. IMPRESSION: 1. Ultrasound guided biopsy of left breast mass 1130. No apparent complications. 2. Ultrasound-guided biopsy of a left axillary lymph node. No apparent complications. Electronically Signed: By: Ammie Ferrier M.D. On: 03/02/2017 09:25    ELIGIBLE FOR AVAILABLE RESEARCH PROTOCOL: [facility] study  ASSESSMENT: 51 y.o. [place], [place]  woman status post left breast overlapping sites biopsy 03/02/2017 for a clinical T3 N1-2, stage IIIA invasive ductal carcinoma, grade 3, estrogen and progesterone receptor negative, HER-2 amplified, with an MIB-1 of 35%  (1) genetics testing 04/06/2017  (2) neoadjuvant chemotherapy will consist of carboplatin and Taxotere given with trastuzumab and Pertuzumab every 21 days for 6 cycles, starting 03/31/2017  (3) trastuzumab will be continued to complete one year  (a) echocardiogram 03/20/2017 showed an ejection fraction of 55%  (4) definitive surgery to follow chemotherapy  (5) adjuvant radiation to follow surgery   PLAN: I spent approximately 30 minutes with Hassan Rowan with most of that time spent discussing her overall situation and specifically her upcoming treatment. We reviewed her echocardiogram results which are very favorable and how to manage the port. She is not squeamish about it, palpated easily, and will have no problems applying the EMLA cream.  I then gave them a "roadmap" on how to take their supportive medications. This is particularly important in regimens with Taxotere because the patient has to take dexamethasone the day before. This was emphasized.  All the medication prescriptions were placed and the patient has a good understanding of how to take them.  She already has an appointment with genetics on day 7 from her first cycle of chemotherapy. I'm going to see her on the same day to troubleshoot side effects and I asked her to keep a diary of symptoms to facilitate that.  Otherwise the plan is to proceed to a total of 6 cycles of the current treatment with close follow-up to prevent end organ damage particularly neuropathy and cardiac problems  She knows to call for any other issues that may develop before her next visit here.Marland Kitchen    Chauncey Cruel, MD   03/27/2017 9:23 PM Medical Oncology and Hematology [facility] [address] [place], [place]  [address] Tel. [phone]    Fax. [phone]

## 2017-03-27 ENCOUNTER — Ambulatory Visit (HOSPITAL_BASED_OUTPATIENT_CLINIC_OR_DEPARTMENT_OTHER): Payer: 59 | Admitting: Oncology

## 2017-03-27 VITALS — BP 109/59 | HR 73 | Temp 98.1°F | Resp 20 | Ht 65.5 in | Wt 190.6 lb

## 2017-03-27 DIAGNOSIS — C773 Secondary and unspecified malignant neoplasm of axilla and upper limb lymph nodes: Secondary | ICD-10-CM

## 2017-03-27 DIAGNOSIS — Z171 Estrogen receptor negative status [ER-]: Secondary | ICD-10-CM | POA: Diagnosis not present

## 2017-03-27 DIAGNOSIS — C50812 Malignant neoplasm of overlapping sites of left female breast: Secondary | ICD-10-CM

## 2017-03-27 MED ORDER — DEXAMETHASONE 4 MG PO TABS: 8.0000 mg | ORAL_TABLET | Freq: Two times a day (BID) | ORAL | 1 refills | Status: DC

## 2017-03-27 MED ORDER — PROCHLORPERAZINE MALEATE 10 MG PO TABS: 10.0000 mg | ORAL_TABLET | Freq: Four times a day (QID) | ORAL | 1 refills | Status: DC | PRN

## 2017-03-27 MED ORDER — LORAZEPAM 0.5 MG PO TABS: 0.5000 mg | ORAL_TABLET | Freq: Every evening | ORAL | 0 refills | Status: DC | PRN

## 2017-03-27 MED ORDER — LIDOCAINE-PRILOCAINE 2.5-2.5 % EX CREA: TOPICAL_CREAM | CUTANEOUS | 3 refills | Status: DC

## 2017-03-31 ENCOUNTER — Ambulatory Visit (HOSPITAL_BASED_OUTPATIENT_CLINIC_OR_DEPARTMENT_OTHER): Payer: 59

## 2017-03-31 ENCOUNTER — Ambulatory Visit: Payer: 59

## 2017-03-31 ENCOUNTER — Other Ambulatory Visit (HOSPITAL_BASED_OUTPATIENT_CLINIC_OR_DEPARTMENT_OTHER): Payer: 59

## 2017-03-31 ENCOUNTER — Other Ambulatory Visit: Payer: 59

## 2017-03-31 ENCOUNTER — Encounter: Payer: Self-pay | Admitting: *Deleted

## 2017-03-31 VITALS — BP 98/55 | HR 66 | Temp 98.1°F | Resp 18

## 2017-03-31 DIAGNOSIS — Z006 Encounter for examination for normal comparison and control in clinical research program: Secondary | ICD-10-CM

## 2017-03-31 DIAGNOSIS — Z171 Estrogen receptor negative status [ER-]: Principal | ICD-10-CM

## 2017-03-31 DIAGNOSIS — Z5111 Encounter for antineoplastic chemotherapy: Secondary | ICD-10-CM | POA: Diagnosis not present

## 2017-03-31 DIAGNOSIS — Z5112 Encounter for antineoplastic immunotherapy: Secondary | ICD-10-CM

## 2017-03-31 DIAGNOSIS — C50812 Malignant neoplasm of overlapping sites of left female breast: Secondary | ICD-10-CM

## 2017-03-31 DIAGNOSIS — Z95828 Presence of other vascular implants and grafts: Secondary | ICD-10-CM

## 2017-03-31 DIAGNOSIS — Z5189 Encounter for other specified aftercare: Secondary | ICD-10-CM | POA: Diagnosis not present

## 2017-03-31 LAB — RESEARCH LABS

## 2017-03-31 LAB — CBC WITH DIFFERENTIAL/PLATELET
BASO%: 0 % (ref 0.0–2.0)
Basophils Absolute: 0 10*3/uL (ref 0.0–0.1)
EOS ABS: 0 10*3/uL (ref 0.0–0.5)
EOS%: 0 % (ref 0.0–7.0)
HCT: 40.2 % (ref 34.8–46.6)
HEMOGLOBIN: 13.3 g/dL (ref 11.6–15.9)
LYMPH#: 1.9 10*3/uL (ref 0.9–3.3)
LYMPH%: 9.1 % — AB (ref 14.0–49.7)
MCH: 29.2 pg (ref 25.1–34.0)
MCHC: 33.1 g/dL (ref 31.5–36.0)
MCV: 88.2 fL (ref 79.5–101.0)
MONO#: 1.2 10*3/uL — AB (ref 0.1–0.9)
MONO%: 5.6 % (ref 0.0–14.0)
NEUT%: 85.3 % — AB (ref 38.4–76.8)
NEUTROS ABS: 17.8 10*3/uL — AB (ref 1.5–6.5)
PLATELETS: 290 10*3/uL (ref 145–400)
RBC: 4.56 10*6/uL (ref 3.70–5.45)
RDW: 13.5 % (ref 11.2–14.5)
WBC: 20.9 10*3/uL — ABNORMAL HIGH (ref 3.9–10.3)
nRBC: 0 % (ref 0–0)

## 2017-03-31 MED ORDER — SODIUM CHLORIDE 0.9 % IV SOLN
840.0000 mg | Freq: Once | INTRAVENOUS | Status: AC
  Administered 2017-03-31: 840 mg via INTRAVENOUS
  Filled 2017-03-31: qty 28

## 2017-03-31 MED ORDER — PALONOSETRON HCL INJECTION 0.25 MG/5ML
0.2500 mg | Freq: Once | INTRAVENOUS | Status: AC
  Administered 2017-03-31: 0.25 mg via INTRAVENOUS

## 2017-03-31 MED ORDER — DIPHENHYDRAMINE HCL 25 MG PO CAPS
ORAL_CAPSULE | ORAL | Status: AC
  Filled 2017-03-31: qty 1

## 2017-03-31 MED ORDER — SODIUM CHLORIDE 0.9% FLUSH
10.0000 mL | INTRAVENOUS | Status: DC | PRN
  Administered 2017-03-31: 10 mL via INTRAVENOUS
  Filled 2017-03-31: qty 10

## 2017-03-31 MED ORDER — SODIUM CHLORIDE 0.9% FLUSH
10.0000 mL | INTRAVENOUS | Status: DC | PRN
  Administered 2017-03-31: 10 mL
  Filled 2017-03-31: qty 10

## 2017-03-31 MED ORDER — ACETAMINOPHEN 325 MG PO TABS
650.0000 mg | ORAL_TABLET | Freq: Once | ORAL | Status: AC
  Administered 2017-03-31: 650 mg via ORAL

## 2017-03-31 MED ORDER — PEGFILGRASTIM 6 MG/0.6ML ~~LOC~~ PSKT
6.0000 mg | PREFILLED_SYRINGE | Freq: Once | SUBCUTANEOUS | Status: AC
  Administered 2017-03-31: 6 mg via SUBCUTANEOUS
  Filled 2017-03-31: qty 0.6

## 2017-03-31 MED ORDER — ACETAMINOPHEN 325 MG PO TABS
ORAL_TABLET | ORAL | Status: AC
  Filled 2017-03-31: qty 2

## 2017-03-31 MED ORDER — SODIUM CHLORIDE 0.9 % IV SOLN
702.5000 mg | Freq: Once | INTRAVENOUS | Status: AC
  Administered 2017-03-31: 700 mg via INTRAVENOUS
  Filled 2017-03-31: qty 70

## 2017-03-31 MED ORDER — DEXAMETHASONE SODIUM PHOSPHATE 10 MG/ML IJ SOLN
INTRAMUSCULAR | Status: AC
  Filled 2017-03-31: qty 1

## 2017-03-31 MED ORDER — HEPARIN SOD (PORK) LOCK FLUSH 100 UNIT/ML IV SOLN
500.0000 [IU] | Freq: Once | INTRAVENOUS | Status: AC | PRN
  Administered 2017-03-31: 500 [IU]
  Filled 2017-03-31: qty 5

## 2017-03-31 MED ORDER — DIPHENHYDRAMINE HCL 25 MG PO CAPS
25.0000 mg | ORAL_CAPSULE | Freq: Once | ORAL | Status: AC
  Administered 2017-03-31: 25 mg via ORAL

## 2017-03-31 MED ORDER — SODIUM CHLORIDE 0.9 % IV SOLN
Freq: Once | INTRAVENOUS | Status: AC
  Administered 2017-03-31: 10:00:00 via INTRAVENOUS

## 2017-03-31 MED ORDER — PALONOSETRON HCL INJECTION 0.25 MG/5ML
INTRAVENOUS | Status: AC
  Filled 2017-03-31: qty 5

## 2017-03-31 MED ORDER — TRASTUZUMAB CHEMO 150 MG IV SOLR
8.0000 mg/kg | Freq: Once | INTRAVENOUS | Status: AC
  Administered 2017-03-31: 693 mg via INTRAVENOUS
  Filled 2017-03-31: qty 33

## 2017-03-31 MED ORDER — DEXTROSE 5 % IV SOLN
75.0000 mg/m2 | Freq: Once | INTRAVENOUS | Status: AC
  Administered 2017-03-31: 150 mg via INTRAVENOUS
  Filled 2017-03-31: qty 15

## 2017-03-31 MED ORDER — DEXAMETHASONE SODIUM PHOSPHATE 10 MG/ML IJ SOLN
10.0000 mg | Freq: Once | INTRAMUSCULAR | Status: AC
  Administered 2017-03-31: 10 mg via INTRAVENOUS

## 2017-03-31 NOTE — Patient Instructions (Signed)
[facility] Discharge Instructions for Patients Receiving Chemotherapy  Today you received the following chemotherapy agents: Herceptin, Perjeta, Taxotere and Carboplatin   To help prevent nausea and vomiting after your treatment, we encourage you to take your nausea medication as directed.    If you develop nausea and vomiting that is not controlled by your nausea medication, call the clinic.   BELOW ARE SYMPTOMS THAT SHOULD BE REPORTED IMMEDIATELY:  *FEVER GREATER THAN 100.5 F  *CHILLS WITH OR WITHOUT FEVER  NAUSEA AND VOMITING THAT IS NOT CONTROLLED WITH YOUR NAUSEA MEDICATION  *UNUSUAL SHORTNESS OF BREATH  *UNUSUAL BRUISING OR BLEEDING  TENDERNESS IN MOUTH AND THROAT WITH OR WITHOUT PRESENCE OF ULCERS  *URINARY PROBLEMS  *BOWEL PROBLEMS  UNUSUAL RASH Items with * indicate a potential emergency and should be followed up as soon as possible.  Feel free to call the clinic you have any questions or concerns. The clinic phone number is [phone].  Please show the [facility] at check-in to the Emergency Department and triage nurse.  Trastuzumab injection for infusion (Herceptin) What is this medicine? TRASTUZUMAB (tras TOO zoo mab) is a monoclonal antibody. It is used to treat breast cancer and stomach cancer. This medicine may be used for other purposes; ask your health care provider or pharmacist if you have questions. COMMON BRAND NAME(S): Herceptin What should I tell my health care provider before I take this medicine? They need to know if you have any of these conditions: -heart disease -heart failure -lung or breathing disease, like asthma -an unusual or allergic reaction to trastuzumab, benzyl alcohol, or other medications, foods, dyes, or preservatives -pregnant or trying to get pregnant -breast-feeding How should I use this medicine? This drug is given as an infusion into a vein. It is administered in a hospital or clinic by a specially  trained health care professional. Talk to your pediatrician regarding the use of this medicine in children. This medicine is not approved for use in children. Overdosage: If you think you have taken too much of this medicine contact a poison control center or emergency room at once. NOTE: This medicine is only for you. Do not share this medicine with others. What if I miss a dose? It is important not to miss a dose. Call your doctor or health care professional if you are unable to keep an appointment. What may interact with this medicine? This medicine may interact with the following medications: -certain types of chemotherapy, such as daunorubicin, doxorubicin, epirubicin, and idarubicin This list may not describe all possible interactions. Give your health care provider a list of all the medicines, herbs, non-prescription drugs, or dietary supplements you use. Also tell them if you smoke, drink alcohol, or use illegal drugs. Some items may interact with your medicine. What should I watch for while using this medicine? Visit your doctor for checks on your progress. Report any side effects. Continue your course of treatment even though you feel ill unless your doctor tells you to stop. Call your doctor or health care professional for advice if you get a fever, chills or sore throat, or other symptoms of a cold or flu. Do not treat yourself. Try to avoid being around people who are sick. You may experience fever, chills and shaking during your first infusion. These effects are usually mild and can be treated with other medicines. Report any side effects during the infusion to your health care professional. Fever and chills usually do not happen with later  infusions. Do not become pregnant while taking this medicine or for 7 months after stopping it. Women should inform their doctor if they wish to become pregnant or think they might be pregnant. Women of child-bearing potential will need to have a  negative pregnancy test before starting this medicine. There is a potential for serious side effects to an unborn child. Talk to your health care professional or pharmacist for more information. Do not breast-feed an infant while taking this medicine or for 7 months after stopping it. Women must use effective birth control with this medicine. What side effects may I notice from receiving this medicine? Side effects that you should report to your doctor or health care professional as soon as possible: -allergic reactions like skin rash, itching or hives, swelling of the face, lips, or tongue -chest pain or palpitations -cough -dizziness -feeling faint or lightheaded, falls -fever -general ill feeling or flu-like symptoms -signs of worsening heart failure like breathing problems; swelling in your legs and feet -unusually weak or tired Side effects that usually do not require medical attention (report to your doctor or health care professional if they continue or are bothersome): -bone pain -changes in taste -diarrhea -joint pain -nausea/vomiting -weight loss This list may not describe all possible side effects. Call your doctor for medical advice about side effects. You may report side effects to FDA at 1-800-FDA-1088. Where should I keep my medicine? This drug is given in a hospital or clinic and will not be stored at home. NOTE: This sheet is a summary. It may not cover all possible information. If you have questions about this medicine, talk to your doctor, pharmacist, or health care provider.  2018 Elsevier/Gold Standard (2016-09-23 14:37:52)  Pertuzumab injection (Perjeta) What is this medicine? PERTUZUMAB (per TOOZ ue mab) is a monoclonal antibody. It is used to treat breast cancer. This medicine may be used for other purposes; ask your health care provider or pharmacist if you have questions. COMMON BRAND NAME(S): PERJETA What should I tell my health care provider before I take  this medicine? They need to know if you have any of these conditions: -heart disease -heart failure -high blood pressure -history of irregular heart beat -recent or ongoing radiation therapy -an unusual or allergic reaction to pertuzumab, other medicines, foods, dyes, or preservatives -pregnant or trying to get pregnant -breast-feeding How should I use this medicine? This medicine is for infusion into a vein. It is given by a health care professional in a hospital or clinic setting. Talk to your pediatrician regarding the use of this medicine in children. Special care may be needed. Overdosage: If you think you have taken too much of this medicine contact a poison control center or emergency room at once. NOTE: This medicine is only for you. Do not share this medicine with others. What if I miss a dose? It is important not to miss your dose. Call your doctor or health care professional if you are unable to keep an appointment. What may interact with this medicine? Interactions are not expected. Give your health care provider a list of all the medicines, herbs, non-prescription drugs, or dietary supplements you use. Also tell them if you smoke, drink alcohol, or use illegal drugs. Some items may interact with your medicine. This list may not describe all possible interactions. Give your health care provider a list of all the medicines, herbs, non-prescription drugs, or dietary supplements you use. Also tell them if you smoke, drink alcohol, or use illegal drugs.  Some items may interact with your medicine. What should I watch for while using this medicine? Your condition will be monitored carefully while you are receiving this medicine. Report any side effects. Continue your course of treatment even though you feel ill unless your doctor tells you to stop. Do not become pregnant while taking this medicine or for 7 months after stopping it. Women should inform their doctor if they wish to become  pregnant or think they might be pregnant. Women of child-bearing potential will need to have a negative pregnancy test before starting this medicine. There is a potential for serious side effects to an unborn child. Talk to your health care professional or pharmacist for more information. Do not breast-feed an infant while taking this medicine or for 7 months after stopping it. Women must use effective birth control with this medicine. Call your doctor or health care professional for advice if you get a fever, chills or sore throat, or other symptoms of a cold or flu. Do not treat yourself. Try to avoid being around people who are sick. You may experience fever, chills, and headache during the infusion. Report any side effects during the infusion to your health care professional. What side effects may I notice from receiving this medicine? Side effects that you should report to your doctor or health care professional as soon as possible: -breathing problems -chest pain or palpitations -dizziness -feeling faint or lightheaded -fever or chills -skin rash, itching or hives -sore throat -swelling of the face, lips, or tongue -swelling of the legs or ankles -unusually weak or tired Side effects that usually do not require medical attention (report to your doctor or health care professional if they continue or are bothersome): -diarrhea -hair loss -nausea, vomiting -tiredness This list may not describe all possible side effects. Call your doctor for medical advice about side effects. You may report side effects to FDA at 1-800-FDA-1088. Where should I keep my medicine? This drug is given in a hospital or clinic and will not be stored at home. NOTE: This sheet is a summary. It may not cover all possible information. If you have questions about this medicine, talk to your doctor, pharmacist, or health care provider.  2018 Elsevier/Gold Standard (2015-11-01 12:08:50)   Docetaxel  injection (Taxotere) What is this medicine? DOCETAXEL (doe se TAX el) is a chemotherapy drug. It targets fast dividing cells, like cancer cells, and causes these cells to die. This medicine is used to treat many types of cancers like breast cancer, certain stomach cancers, head and neck cancer, lung cancer, and prostate cancer. This medicine may be used for other purposes; ask your health care provider or pharmacist if you have questions. COMMON BRAND NAME(S): Docefrez, Taxotere What should I tell my health care provider before I take this medicine? They need to know if you have any of these conditions: -infection (especially a virus infection such as chickenpox, cold sores, or herpes) -liver disease -low blood counts, like low white cell, platelet, or red cell counts -an unusual or allergic reaction to docetaxel, polysorbate 80, other chemotherapy agents, other medicines, foods, dyes, or preservatives -pregnant or trying to get pregnant -breast-feeding How should I use this medicine? This drug is given as an infusion into a vein. It is administered in a hospital or clinic by a specially trained health care professional. Talk to your pediatrician regarding the use of this medicine in children. Special care may be needed. Overdosage: If you think you have taken too much of  this medicine contact a poison control center or emergency room at once. NOTE: This medicine is only for you. Do not share this medicine with others. What if I miss a dose? It is important not to miss your dose. Call your doctor or health care professional if you are unable to keep an appointment. What may interact with this medicine? -cyclosporine -erythromycin -ketoconazole -medicines to increase blood counts like filgrastim, pegfilgrastim, sargramostim -vaccines Talk to your doctor or health care professional before taking any of these medicines: -acetaminophen -aspirin -ibuprofen -ketoprofen -naproxen This list  may not describe all possible interactions. Give your health care provider a list of all the medicines, herbs, non-prescription drugs, or dietary supplements you use. Also tell them if you smoke, drink alcohol, or use illegal drugs. Some items may interact with your medicine. What should I watch for while using this medicine? Your condition will be monitored carefully while you are receiving this medicine. You will need important blood work done while you are taking this medicine. This drug may make you feel generally unwell. This is not uncommon, as chemotherapy can affect healthy cells as well as cancer cells. Report any side effects. Continue your course of treatment even though you feel ill unless your doctor tells you to stop. In some cases, you may be given additional medicines to help with side effects. Follow all directions for their use. Call your doctor or health care professional for advice if you get a fever, chills or sore throat, or other symptoms of a cold or flu. Do not treat yourself. This drug decreases your body's ability to fight infections. Try to avoid being around people who are sick. This medicine may increase your risk to bruise or bleed. Call your doctor or health care professional if you notice any unusual bleeding. This medicine may contain alcohol in the product. You may get drowsy or dizzy. Do not drive, use machinery, or do anything that needs mental alertness until you know how this medicine affects you. Do not stand or sit up quickly, especially if you are an older patient. This reduces the risk of dizzy or fainting spells. Avoid alcoholic drinks. Do not become pregnant while taking this medicine. Women should inform their doctor if they wish to become pregnant or think they might be pregnant. There is a potential for serious side effects to an unborn child. Talk to your health care professional or pharmacist for more information. Do not breast-feed an infant while taking  this medicine. What side effects may I notice from receiving this medicine? Side effects that you should report to your doctor or health care professional as soon as possible: -allergic reactions like skin rash, itching or hives, swelling of the face, lips, or tongue -low blood counts - This drug may decrease the number of white blood cells, red blood cells and platelets. You may be at increased risk for infections and bleeding. -signs of infection - fever or chills, cough, sore throat, pain or difficulty passing urine -signs of decreased platelets or bleeding - bruising, pinpoint red spots on the skin, black, tarry stools, nosebleeds -signs of decreased red blood cells - unusually weak or tired, fainting spells, lightheadedness -breathing problems -fast or irregular heartbeat -low blood pressure -mouth sores -nausea and vomiting -pain, swelling, redness or irritation at the injection site -pain, tingling, numbness in the hands or feet -swelling of the ankle, feet, hands -weight gain Side effects that usually do not require medical attention (report to your doctor or health care  professional if they continue or are bothersome): -bone pain -complete hair loss including hair on your head, underarms, pubic hair, eyebrows, and eyelashes -diarrhea -excessive tearing -changes in the color of fingernails -loosening of the fingernails -nausea -muscle pain -red flush to skin -sweating -weak or tired This list may not describe all possible side effects. Call your doctor for medical advice about side effects. You may report side effects to FDA at 1-800-FDA-1088. Where should I keep my medicine? This drug is given in a hospital or clinic and will not be stored at home. NOTE: This sheet is a summary. It may not cover all possible information. If you have questions about this medicine, talk to your doctor, pharmacist, or health care provider.  2018 Elsevier/Gold Standard (2015-11-01  12:32:56)   Carboplatin injection What is this medicine? CARBOPLATIN (KAR boe pla tin) is a chemotherapy drug. It targets fast dividing cells, like cancer cells, and causes these cells to die. This medicine is used to treat ovarian cancer and many other cancers. This medicine may be used for other purposes; ask your health care provider or pharmacist if you have questions. COMMON BRAND NAME(S): Paraplatin What should I tell my health care provider before I take this medicine? They need to know if you have any of these conditions: -blood disorders -hearing problems -kidney disease -recent or ongoing radiation therapy -an unusual or allergic reaction to carboplatin, cisplatin, other chemotherapy, other medicines, foods, dyes, or preservatives -pregnant or trying to get pregnant -breast-feeding How should I use this medicine? This drug is usually given as an infusion into a vein. It is administered in a hospital or clinic by a specially trained health care professional. Talk to your pediatrician regarding the use of this medicine in children. Special care may be needed. Overdosage: If you think you have taken too much of this medicine contact a poison control center or emergency room at once. NOTE: This medicine is only for you. Do not share this medicine with others. What if I miss a dose? It is important not to miss a dose. Call your doctor or health care professional if you are unable to keep an appointment. What may interact with this medicine? -medicines for seizures -medicines to increase blood counts like filgrastim, pegfilgrastim, sargramostim -some antibiotics like amikacin, gentamicin, neomycin, streptomycin, tobramycin -vaccines Talk to your doctor or health care professional before taking any of these medicines: -acetaminophen -aspirin -ibuprofen -ketoprofen -naproxen This list may not describe all possible interactions. Give your health care provider a list of all the  medicines, herbs, non-prescription drugs, or dietary supplements you use. Also tell them if you smoke, drink alcohol, or use illegal drugs. Some items may interact with your medicine. What should I watch for while using this medicine? Your condition will be monitored carefully while you are receiving this medicine. You will need important blood work done while you are taking this medicine. This drug may make you feel generally unwell. This is not uncommon, as chemotherapy can affect healthy cells as well as cancer cells. Report any side effects. Continue your course of treatment even though you feel ill unless your doctor tells you to stop. In some cases, you may be given additional medicines to help with side effects. Follow all directions for their use. Call your doctor or health care professional for advice if you get a fever, chills or sore throat, or other symptoms of a cold or flu. Do not treat yourself. This drug decreases your body's ability to fight infections.  Try to avoid being around people who are sick. This medicine may increase your risk to bruise or bleed. Call your doctor or health care professional if you notice any unusual bleeding. Be careful brushing and flossing your teeth or using a toothpick because you may get an infection or bleed more easily. If you have any dental work done, tell your dentist you are receiving this medicine. Avoid taking products that contain aspirin, acetaminophen, ibuprofen, naproxen, or ketoprofen unless instructed by your doctor. These medicines may hide a fever. Do not become pregnant while taking this medicine. Women should inform their doctor if they wish to become pregnant or think they might be pregnant. There is a potential for serious side effects to an unborn child. Talk to your health care professional or pharmacist for more information. Do not breast-feed an infant while taking this medicine. What side effects may I notice from receiving this  medicine? Side effects that you should report to your doctor or health care professional as soon as possible: -allergic reactions like skin rash, itching or hives, swelling of the face, lips, or tongue -signs of infection - fever or chills, cough, sore throat, pain or difficulty passing urine -signs of decreased platelets or bleeding - bruising, pinpoint red spots on the skin, black, tarry stools, nosebleeds -signs of decreased red blood cells - unusually weak or tired, fainting spells, lightheadedness -breathing problems -changes in hearing -changes in vision -chest pain -high blood pressure -low blood counts - This drug may decrease the number of white blood cells, red blood cells and platelets. You may be at increased risk for infections and bleeding. -nausea and vomiting -pain, swelling, redness or irritation at the injection site -pain, tingling, numbness in the hands or feet -problems with balance, talking, walking -trouble passing urine or change in the amount of urine Side effects that usually do not require medical attention (report to your doctor or health care professional if they continue or are bothersome): -hair loss -loss of appetite -metallic taste in the mouth or changes in taste This list may not describe all possible side effects. Call your doctor for medical advice about side effects. You may report side effects to FDA at 1-800-FDA-1088. Where should I keep my medicine? This drug is given in a hospital or clinic and will not be stored at home. NOTE: This sheet is a summary. It may not cover all possible information. If you have questions about this medicine, talk to your doctor, pharmacist, or health care provider.  2018 Elsevier/Gold Standard (2008-01-04 14:38:05)

## 2017-03-31 NOTE — Progress Notes (Signed)
Per Dr. Jana Hakim okay to use CMP from 02/19/17 for treatment today.   Discharge and Neulasta instructions reviewed with patient and patient's family. All verbalized understanding and teach back was performed.

## 2017-03-31 NOTE — Patient Instructions (Signed)

## 2017-04-01 ENCOUNTER — Encounter: Payer: Self-pay | Admitting: *Deleted

## 2017-04-01 NOTE — Progress Notes (Signed)
[facility]  Clinical Social Work received referral from pt to complete ADRs. CSW contacted pt and left message in attempt to arrange appointment. CSW encouraged return call in order to assist with this request.  Loren Racer, LCSW, OSW-C Clinical Social Worker [place]  [facility] Phone: [phone] Fax: [phone]

## 2017-04-06 ENCOUNTER — Ambulatory Visit (HOSPITAL_BASED_OUTPATIENT_CLINIC_OR_DEPARTMENT_OTHER): Payer: 59 | Admitting: Oncology

## 2017-04-06 ENCOUNTER — Ambulatory Visit: Payer: 59

## 2017-04-06 ENCOUNTER — Encounter: Payer: 59 | Admitting: Genetic Counselor

## 2017-04-06 ENCOUNTER — Other Ambulatory Visit: Payer: 59

## 2017-04-06 ENCOUNTER — Ambulatory Visit (HOSPITAL_BASED_OUTPATIENT_CLINIC_OR_DEPARTMENT_OTHER): Payer: 59

## 2017-04-06 ENCOUNTER — Other Ambulatory Visit: Payer: Self-pay | Admitting: *Deleted

## 2017-04-06 VITALS — BP 98/65 | HR 79 | Temp 97.7°F | Ht 65.5 in

## 2017-04-06 VITALS — BP 105/58 | HR 55

## 2017-04-06 DIAGNOSIS — Z95828 Presence of other vascular implants and grafts: Secondary | ICD-10-CM

## 2017-04-06 DIAGNOSIS — K521 Toxic gastroenteritis and colitis: Secondary | ICD-10-CM

## 2017-04-06 DIAGNOSIS — Z171 Estrogen receptor negative status [ER-]: Principal | ICD-10-CM

## 2017-04-06 DIAGNOSIS — C50812 Malignant neoplasm of overlapping sites of left female breast: Secondary | ICD-10-CM

## 2017-04-06 DIAGNOSIS — R110 Nausea: Secondary | ICD-10-CM | POA: Diagnosis not present

## 2017-04-06 LAB — CBC WITH DIFFERENTIAL/PLATELET
BASO%: 0.6 % (ref 0.0–2.0)
Basophils Absolute: 0 10*3/uL (ref 0.0–0.1)
EOS%: 0.8 % (ref 0.0–7.0)
Eosinophils Absolute: 0.1 10*3/uL (ref 0.0–0.5)
HCT: 40.2 % (ref 34.8–46.6)
HGB: 13.3 g/dL (ref 11.6–15.9)
LYMPH%: 41.1 % (ref 14.0–49.7)
MCH: 29.4 pg (ref 25.1–34.0)
MCHC: 33 g/dL (ref 31.5–36.0)
MCV: 89 fL (ref 79.5–101.0)
MONO#: 0.6 10*3/uL (ref 0.1–0.9)
MONO%: 9 % (ref 0.0–14.0)
NEUT%: 48.5 % (ref 38.4–76.8)
NEUTROS ABS: 3.1 10*3/uL (ref 1.5–6.5)
Platelets: 183 10*3/uL (ref 145–400)
RBC: 4.52 10*6/uL (ref 3.70–5.45)
RDW: 13.8 % (ref 11.2–14.5)
WBC: 6.3 10*3/uL (ref 3.9–10.3)
lymph#: 2.6 10*3/uL (ref 0.9–3.3)

## 2017-04-06 MED ORDER — SODIUM CHLORIDE 0.9 % IV SOLN: Freq: Once | INTRAVENOUS | Status: DC

## 2017-04-06 MED ORDER — ONDANSETRON HCL 4 MG/2ML IJ SOLN
8.0000 mg | Freq: Once | INTRAMUSCULAR | Status: AC
  Administered 2017-04-06: 8 mg via INTRAVENOUS

## 2017-04-06 MED ORDER — SODIUM CHLORIDE 0.9 % IV SOLN
INTRAVENOUS | Status: DC
  Administered 2017-04-06: 11:00:00 via INTRAVENOUS
  Filled 2017-04-06 (×2): qty 1000

## 2017-04-06 MED ORDER — SODIUM CHLORIDE 0.9 % IV SOLN: 10.0000 mg | Freq: Once | INTRAVENOUS | Status: DC

## 2017-04-06 MED ORDER — HEPARIN SOD (PORK) LOCK FLUSH 100 UNIT/ML IV SOLN
500.0000 [IU] | Freq: Once | INTRAVENOUS | Status: AC
  Administered 2017-04-06: 500 [IU] via INTRAVENOUS
  Filled 2017-04-06: qty 5

## 2017-04-06 MED ORDER — ONDANSETRON HCL 40 MG/20ML IJ SOLN: Freq: Once | INTRAMUSCULAR | Status: DC

## 2017-04-06 MED ORDER — SODIUM CHLORIDE 0.9 % IV SOLN: INTRAVENOUS | Status: DC

## 2017-04-06 MED ORDER — SODIUM CHLORIDE 0.9% FLUSH
10.0000 mL | INTRAVENOUS | Status: DC | PRN
  Administered 2017-04-06: 10 mL via INTRAVENOUS
  Filled 2017-04-06: qty 10

## 2017-04-06 MED ORDER — ONDANSETRON HCL 8 MG PO TABS: 8.0000 mg | ORAL_TABLET | Freq: Three times a day (TID) | ORAL | 3 refills | Status: DC | PRN

## 2017-04-06 MED ORDER — DEXAMETHASONE SODIUM PHOSPHATE 10 MG/ML IJ SOLN
10.0000 mg | Freq: Once | INTRAMUSCULAR | Status: AC
  Administered 2017-04-06: 10 mg via INTRAVENOUS

## 2017-04-06 MED ORDER — ONDANSETRON HCL 4 MG/2ML IJ SOLN
INTRAMUSCULAR | Status: AC
  Filled 2017-04-06: qty 4

## 2017-04-06 MED ORDER — DEXAMETHASONE SODIUM PHOSPHATE 10 MG/ML IJ SOLN
INTRAMUSCULAR | Status: AC
  Filled 2017-04-06: qty 1

## 2017-04-06 NOTE — Progress Notes (Signed)
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 Today is day 7 cycle 1 of 6 planned cycles of carboplatin, docetaxel, trastuzumab gamma and Pertuzumab which she started 03/31/2017.  She tells me she did generally well for the first 4 days. She did not have mouth sores but her mouth felt like she had eaten spicy food. The beginning on day 4 she had a little bit of gum bleeding and then day 5 the diarrhea started and it continues. She had multiple bowel movements overnight. She also had a great deal of belching, smelling like raw sewage she says, although nausea was still fairly well-controlled and she did not vomit.k.   REVIEW OF SYSTEMS: She is very motivated to continue treatments and did her best to minimize her symptoms but clearly she needed some fluids today which we will provide. She likely is also potassium depleted and we will replete status well. There has been no intercurrent fever or bleeding. The port has worked well. She has had no peripheral neuropathy symptoms. Otherwise a detailed review of systems today was stable   BREAST CANCER HISTORY: From the original intake note:  [name] herself noted a change in her left breast when looking into a mirror and then palpating a  mass in the upper-outer quadrant. She brought this to medical attention and on 02/24/2017 underwent bilateral diagnostic mammography with tomography and left breast ultrasonography at the breast Center. The breast density was category C. In the upper portion of the left breast there was a broad area of distortion measuring up to 7.5 cm. On exam there was a broad firm area involving the upper outer quadrant of the left breast which was visibly protruding. By ultrasound at the 11:00 axis 8 cm from the nipple there was an irregular hypoechoic mass measuring 1.4 cm with additiona ases at 2:00, 3 cm from the nipple and multiple other masses as described, all in the upper-outer quadrant primarily. Ultrasound of the left axilla showed at least 3 prominent lymph nodes one of which had a cortical bulge.  On 03/02/2017 the patient underwent biopsy of a left breast mass at the 11:30 o'clock position and a second mass described as upper outer quadrant as well as one of the suspicious left axillary lymph nodes. These all showed invasive ductal carcinoma, grade 3. Prognostic panel from one of the 2 masses showed the tumor to be estrogen and progesterone receptor negative, but HER-2 amplified, with a signals ratio of 8.24 and the number per cell 15.65.  Her subsequent history is as detailed below   PAST MEDICAL HISTORY: Past Medical History:  Diagnosis Date  . Breast cancer ([place])   . 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 Complication of anesthesia    pt states BP tends to drop and has a hard time waking up - pt was told by  prior anesthesiologist to make sure this was made known for furture surgeries  . Uterine fibroid     PAST SURGICAL HISTORY: Past Surgical History:  Procedure Laterality Date  . ABDOMINAL HYSTERECTOMY    . ABDOMINOPLASTY N/A 02/28/2014   Procedure: ABDOMINOPLASTY with Panniculectomy;  Surgeon: [name], MD;  Location: [facility];   Service: Gynecology;  Laterality: N/A;  . APPENDECTOMY    . BILATERAL SALPINGECTOMY Bilateral 02/28/2014   Procedure: BILATERAL SALPINGECTOMY;  Surgeon: [name], MD;  Location: [facility];  Service: Gynecology;  Laterality: Bilateral;  . CATARACT EXTRACTION, BILATERAL    . PORTACATH PLACEMENT Right 03/18/2017   Procedure: INSERTION PORT-A-CATH;  Surgeon: Excell Seltzer, MD;  Location: [facility];  Service: General;  Laterality: Right;  . reverse tubal ligation    . SCAR REVISION N/A 02/28/2014   Procedure: SCAR REVISION;  Surgeon: [name], MD;  Location: [facility];  Service: Gynecology;  Laterality: N/A;  . SUPRACERVICAL ABDOMINAL HYSTERECTOMY N/A 02/28/2014   Procedure: HYSTERECTOMY SUPRACERVICAL ABDOMINAL;  Surgeon: [name], MD;  Location: [facility];  Service: Gynecology;  Laterality: N/A;  . TUBAL LIGATION    . WISDOM TOOTH EXTRACTION      FAMILY HISTORY Family History  Problem Relation Age of Onset  . Heart disease Mother   . Hypertension Mother   . Diabetes Father   . Heart disease Father   . Cancer Maternal Grandmother        intestines  . Heart disease Maternal Grandmother   . Heart disease Maternal Grandfather   . Cancer Maternal Grandfather        esophageal  . Diabetes Paternal Grandmother   . Heart disease Paternal Grandmother   . Kidney cancer Paternal Grandmother   . Tuberculosis Paternal Grandfather   . Heart disease Paternal Grandfather   . Melanoma Paternal Grandfather   . Breast cancer Maternal Aunt   . Breast cancer Cousin   The patient's father died from heart disease at age 38. The patient's mother is living at age 59. The patient has one brother, 2 sisters. On the mother's side and aunt had breast cancer at age 22 and her daughter had breast cancer in her early 71s. There is also a grandfather with esophageal cancer and a grandmother with colon cancer. On the paternal side there is a grandmother with kidney cancer at an early age and a grandfather  with melanoma at an early age.  GYNECOLOGIC HISTORY:  Patient's last menstrual period was 02/11/2014.  Menarche age 51, first live birth age 51, the patient is [place] P2. She underwent hysterectomy without salpingo-oophorectomy May 2016. She did not use hormone replacement. She did use oral contraceptives approximately 10 years in the 1980s.  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 Her husband Fish farm manager in the same [place], which is a 10% aviation enrollment. Daughter Lorenda Hatchet is a hairstylist in [place] and son Chip Boer is a Physiological scientist in [place] the patient has 2 grandsons and one granddaughter. She attends a [employer]    ADVANCED DIRECTIVES: The patient has completed advanced directives and will have the motorized at the next visit   HEALTH MAINTENANCE: Social History  Substance Use Topics  . Smoking status: Former Smoker    Packs/day: 0.50    Years: 34.00    Types: Cigarettes  . Smokeless tobacco: Never Used  . Alcohol use No     Comment: social drink  Colonoscopy:No  PAP: 03/04/2017  Bone density: No   No Known Allergies  Current Outpatient Prescriptions  Medication Sig Dispense Refill  . ALPRAZolam (XANAX) 0.5 MG tablet TAKE HALF TO 1 TABLET BY MOUTH EVERY 8 HOURS AS NEEDED FOR ANXIETY  2  . cephALEXin (KEFLEX) 500 MG capsule Take 500 mg by mouth 3 (three) times daily.    Marland Kitchen dexamethasone (DECADRON) 4 MG tablet Take 2 tablets (8 mg total) by mouth 2 (two) times daily. Start the day before Taxotere. Then again the day after chemo for 3 days. 30 tablet 1  . Hydrocodone-Chlorpheniramine 5-4 MG/5ML SOLN Take 5 mLs by mouth every 12 (twelve) hours as needed.    . lidocaine-prilocaine (EMLA) cream Apply to affected area once 30 g 3  . LORazepam (ATIVAN) 0.5 MG tablet Take 1 tablet (0.5 mg total) by mouth at bedtime as needed (Nausea or vomiting). 30 tablet 0  . ondansetron  (ZOFRAN) 8 MG tablet Take 1 tablet (8 mg total) by mouth every 8 (eight) hours as needed for nausea or vomiting. 30 tablet 3  . oxyCODONE (OXY IR/ROXICODONE) 5 MG immediate release tablet Take 1 tablet (5 mg total) by mouth every 4 (four) hours as needed (for pain score of 1-4). (Patient not taking: Reported on 03/20/2017) 10 tablet 0  . prochlorperazine (COMPAZINE) 10 MG tablet Take 1 tablet (10 mg total) by mouth every 6 (six) hours as needed (Nausea or vomiting). 30 tablet 1   No current facility-administered medications for this visit.     OBJECTIVE: Middle-aged white woman Who appears acutely ill  Vitals:   04/06/17 1019  BP: 98/65  Pulse: 79  Temp: 97.7 F (36.5 C)     There is no height or weight on file to calculate BMI.    ECOG FS:0 - Asymptomatic  Sclerae unicteric, pupils round and equal Oropharynx clear and moist No cervical or supraclavicular adenopathy Lungs no rales or rhonchi Heart regular rate and rhythm Abd soft, nontender, Increased bowel sounds MSK no focal spinal tenderness, no upper extremity lymphedema Neuro: nonfocal, well oriented, appropriate affect Breasts:Deferred   LAB RESULTS:  CMP     Component Value Date/Time   NA 140 03/11/2017 1236   K 4.1 03/11/2017 1236   CL 106 03/01/2014 0607   CO2 28 03/11/2017 1236   GLUCOSE 127 03/11/2017 1236   BUN 11.4 03/11/2017 1236   CREATININE 0.8 03/11/2017 1236   CALCIUM 9.6 03/11/2017 1236   PROT 6.9 03/11/2017 1236   ALBUMIN 3.2 (L) 03/11/2017 1236   AST 13 03/11/2017 1236   ALT 12 03/11/2017 1236   ALKPHOS 86 03/11/2017 1236   BILITOT 0.33 03/11/2017 1236   GFRNONAA >90 03/01/2014 0607   GFRAA >90 03/01/2014 0607    No results found for: TOTALPROTELP, ALBUMINELP, A1GS, A2GS, BETS, BETA2SER, GAMS, MSPIKE, SPEI  No results found for: Nils Pyle, [facility]  Lab Results  Component Value Date   WBC 6.3 04/06/2017   NEUTROABS 3.1 04/06/2017   HGB 13.3 04/06/2017   HCT 40.2  04/06/2017   MCV 89.0 04/06/2017   PLT 183 04/06/2017      Chemistry      Component Value Date/Time   NA 140 03/11/2017 1236   K 4.1 03/11/2017 1236   CL 106 03/01/2014 0607   CO2 28 03/11/2017 1236   BUN 11.4 03/11/2017 1236   CREATININE 0.8 03/11/2017 1236      Component Value Date/Time   CALCIUM 9.6 03/11/2017 1236   ALKPHOS 86 03/11/2017  1236   AST 13 03/11/2017 1236   ALT 12 03/11/2017 1236   BILITOT 0.33 03/11/2017 1236       No results found for: LABCA2  No components found for: [id number]  No results for input(s): INR in the last 168 hours.  Urinalysis    Component Value Date/Time   COLORURINE YELLOW 02/23/2014 1004   APPEARANCEUR CLEAR 02/23/2014 1004   LABSPEC 1.025 02/23/2014 1004   PHURINE 5.5 02/23/2014 1004   GLUCOSEU NEGATIVE 02/23/2014 1004   HGBUR NEGATIVE 02/23/2014 1004   BILIRUBINUR NEGATIVE 02/23/2014 1004   KETONESUR NEGATIVE 02/23/2014 1004   PROTEINUR NEGATIVE 02/23/2014 1004   UROBILINOGEN 0.2 02/23/2014 1004   NITRITE NEGATIVE 02/23/2014 1004   LEUKOCYTESUR NEGATIVE 02/23/2014 1004     STUDIES: Ct Chest W Contrast  Result Date: 03/20/2017 CLINICAL DATA:  Breast carcinoma left-side EXAM: CT CHEST WITH CONTRAST TECHNIQUE: Multidetector CT imaging of the chest was performed during intravenous contrast administration. CONTRAST:  12m ISOVUE-300 IOPAMIDOL (ISOVUE-300) INJECTION 61% COMPARISON:  Chest radiograph March 18, 2017 FINDINGS: Cardiovascular: There is no thoracic aortic aneurysm or dissection. The visualized great vessels appear normal. There is no appreciable pericardial thickening. No major vessel pulmonary embolus evident. Port-A-Cath tip is in the superior vena cava. Mediastinum/Nodes: There is mild inhomogeneity in the left lobe of the thyroid without dominant mass. There is a lymph node in the right hilum measuring 1.3 x 1.1 cm. There is adenopathy in the sub- carinal region measuring 2.2 x 1.8 cm. This adenopathy is best appreciable  on the coronal reformats. There is a subcentimeter lymph node in the left hilum. There is a lymph node anterior to the carina measuring 1.4 x 1.0 cm. There is a small hiatal hernia. Lungs/Pleura: There is airspace consolidation in portions of the medial and posterior segments of the right lower lobe. There is no parenchymal nodular lesion in the lungs. No pleural effusion or pleural thickening. Upper Abdomen: Visualized upper abdominal structures appear unremarkable. Musculoskeletal: There are no blastic or lytic bone lesions. No chest wall lesions are appreciable on this study. IMPRESSION: Airspace consolidation in portions of the medial and posterior segments of the right lower lobe, consistent with pneumonia. No parenchymal lung mass. Adenopathy, most notably in the sub- carinal region. Correlation with nuclear medicine PET study to assess degree of metabolic activity may be warranted in this circumstance. Probable multinodular goiter without dominant thyroid mass. Additional imaging surveillance not felt to be warranted based on consensus guidelines. Small hiatal hernia. These results will be called to the ordering clinician or representative by the Radiologist Assistant, and communication documented in the PACS or zVision Dashboard. Electronically Signed   By: WLowella GripIII M.D.   On: 03/20/2017 11:11   Nm Bone Scan Whole Body  Result Date: 03/20/2017 CLINICAL DATA:  Breast carcinoma EXAM: NUCLEAR MEDICINE WHOLE BODY BONE SCAN TECHNIQUE: Whole body anterior and posterior images were obtained approximately 3 hours after intravenous injection of radiopharmaceutical. RADIOPHARMACEUTICALS:  21.8 mCi Technetium-[id number] IV COMPARISON:  None. FINDINGS: There is slight increased uptake in the hindfoot regions, likely of arthropathic etiology. Elsewhere the distribution of uptake is unremarkable. There are no findings are felt to be indicative of bony metastatic disease. Kidneys are noted in the flank  positions bilaterally. IMPRESSION: No demonstrable bony metastatic disease. Probable arthropathy in each hindfoot region. Electronically Signed   By: WiLowella GripII M.D.   On: 03/20/2017 14:11   Mr Breast Bilateral W Wo Contrast  Result Date: 03/18/2017 CLINICAL DATA:  Left breast recently biopsied grade lll invasive ductal carcinoma and ductal carcinoma in-situ with lymphovascular invasion at 11:30, grade lll ductal carcinoma with ductal carcinoma in-situ with calcifications in the upper outer quadrant and metastatic carcinoma in a left axillary lymph node. LABS:  None obtained at the time of imaging. EXAM: BILATERAL BREAST MRI WITH AND WITHOUT CONTRAST TECHNIQUE: Multiplanar, multisequence MR images of both breasts were obtained prior to and following the intravenous administration of 20 ml of MultiHance. THREE-DIMENSIONAL MR IMAGE RENDERING ON INDEPENDENT WORKSTATION: Three-dimensional MR images were rendered by post-processing of the original MR data on an independent workstation. The three-dimensional MR images were interpreted, and findings are reported in the following complete MRI report for this study. Three dimensional images were evaluated at the independent DynaCad workstation COMPARISON:  Previous exam(s). FINDINGS: Breast composition: c. Heterogeneous fibroglandular tissue. Background parenchymal enhancement: Marked. There is marked bilateral enhancement lowering the sensitivity of the MRI. Right breast: No mass or abnormal enhancement. Left breast: There is asymmetric spiculated enhancement in the superior aspect of the left breast measuring 5.1 x 2.5 x 3.0 cm in transverse, anterior posterior and craniocaudal dimension. This corresponds to the areas of recently biopsied malignancies. There are several enhancing nodules. There is an enhancing nodule in the upper-inner quadrant of the middle third of the left breast measuring 8 mm. There is a 4 mm enhancing nodule in the central aspect of the  breast. Lymph nodes: There are several abnormal appearing left axillary lymph nodes. The largest lymph node measures 2.1 cm with a focal area of thickening and biopsy change corresponding with the known metastatic disease. Ancillary findings:  None. IMPRESSION: Markedly bilateral enhancement of both breasts lowering the sensitivity of MRI. Asymmetric spiculated enhancement in the superior aspect of the left breast measuring 5.1 x 2.5 x 3.0 cm corresponding to the known areas of malignancy. Other enhancing nodules are seen in the left breast. Abnormal left axillary adenopathy. RECOMMENDATION: Treatment planning of the known left breast cancer and axillary metastasis is recommended. BI-RADS CATEGORY  6: Known biopsy-proven malignancy. Electronically Signed   By: Lillia Mountain M.D.   On: 03/18/2017 10:36   Dg Chest Port 1 View  Result Date: 03/18/2017 CLINICAL DATA:  Port-A-Cath placement. EXAM: PORTABLE CHEST 1 VIEW COMPARISON:  No recent prior. FINDINGS: Port-A-Cath noted with lead tip over the superior vena cava. Heart size stable. Mild basilar atelectasis and/or infiltrates. No pleural effusion or pneumothorax . IMPRESSION: 1. Port-A-Cath noted with tip projected superior vena cava. 2. Mild basilar atelectasis and/or infiltrates. Electronically Signed   By: Marcello Moores  Register   On: 03/18/2017 10:09   Dg C-arm 1-60 Min-no Report  Result Date: 03/18/2017 Fluoroscopy was utilized by the requesting physician.  No radiographic interpretation.    ELIGIBLE FOR AVAILABLE RESEARCH PROTOCOL: [place] study  ASSESSMENT: 51 y.o. [place], [place] woman status post left breast overlapping sites biopsy 03/02/2017 for a clinical T3 N1-2, stage IIIA invasive ductal carcinoma, grade 3, estrogen and progesterone receptor negative, HER-2 amplified, with an MIB-1 of 35%  (1) genetics testing 04/06/2017  (2) neoadjuvant chemotherapy will consist of carboplatin and Taxotere given with trastuzumab and Pertuzumab every 21 days for 6  cycles, starting 03/31/2017  (a) Pertuzumab stopped after cycle 1 because of severe diarrhea complications  (3) trastuzumab will be continued to complete one year  (a) echocardiogram 03/20/2017 showed an ejection fraction of 55%  (4) definitive surgery to follow chemotherapy  (5) adjuvant radiation to follow surgery   PLAN: Tru did well initially, for  the first 4 days, after her treatment. When she finished his steroids however she started feeling poorly. In addition to problems with nausea she has also developed significant diarrhea. This letter is most likely going to be due to her Pertuzumab, which we will omit from the next cycle, but just in case we will send a C. difficile today.  We are going to support her with fluids. I also wrote for Zofran that she may combine with Compazine and/or steroids to bring the nausea under control. As far as the diarrhea is concerned they're using Imodium appropriately. If this does not control the diarrhea we will add Questran.  For cycle 2 we are going to continue the dexamethasone at lower dose an additional 2 days, and started Zofran on day 4 to be continued for 3 days. We are also as noted going to discontinue the Pertuzumab. We will try to reinstate it at a half dose with cycle 3.  I believe with these changes Kamori will be able to complete her treatments as scheduled. She knows to call for any problems that may develop before her next visit here.   Chauncey Cruel, MD   04/06/2017 7:06 PM Medical Oncology and Hematology [facility] [address] [place], [place] [address] Tel. [phone]    Fax. [phone]

## 2017-04-06 NOTE — Patient Instructions (Signed)
Dehydration, Adult Dehydration is a condition in which there is not enough fluid or water in the body. This happens when you lose more fluids than you take in. Important organs, such as the kidneys, brain, and heart, cannot function without a proper amount of fluids. Any loss of fluids from the body can lead to dehydration. Dehydration can range from mild to severe. This condition should be treated right away to prevent it from becoming severe. What are the causes? This condition may be caused by:  Vomiting.  Diarrhea.  Excessive sweating, such as from heat exposure or exercise.  Not drinking enough fluid, especially: ? When ill. ? While doing activity that requires a lot of energy.  Excessive urination.  Fever.  Infection.  Certain medicines, such as medicines that cause the body to lose excess fluid (diuretics).  Inability to access safe drinking water.  Reduced physical ability to get adequate water and food.  What increases the risk? This condition is more likely to develop in people:  Who have a poorly controlled long-term (chronic) illness, such as diabetes, heart disease, or kidney disease.  Who are age 65 or older.  Who are disabled.  Who live in a place with high altitude.  Who play endurance sports.  What are the signs or symptoms? Symptoms of mild dehydration may include:  Thirst.  Dry lips.  Slightly dry mouth.  Dry, warm skin.  Dizziness. Symptoms of moderate dehydration may include:  Very dry mouth.  Muscle cramps.  Dark urine. Urine may be the color of tea.  Decreased urine production.  Decreased tear production.  Heartbeat that is irregular or faster than normal (palpitations).  Headache.  Light-headedness, especially when you stand up from a sitting position.  Fainting (syncope). Symptoms of severe dehydration may include:  Changes in skin, such as: ? Cold and clammy skin. ? Blotchy (mottled) or pale skin. ? Skin that does  not quickly return to normal after being lightly pinched and released (poor skin turgor).  Changes in body fluids, such as: ? Extreme thirst. ? No tear production. ? Inability to sweat when body temperature is high, such as in hot weather. ? Very little urine production.  Changes in vital signs, such as: ? Weak pulse. ? Pulse that is more than 100 beats a minute when sitting still. ? Rapid breathing. ? Low blood pressure.  Other changes, such as: ? Sunken eyes. ? Cold hands and feet. ? Confusion. ? Lack of energy (lethargy). ? Difficulty waking up from sleep. ? Short-term weight loss. ? Unconsciousness. How is this diagnosed? This condition is diagnosed based on your symptoms and a physical exam. Blood and urine tests may be done to help confirm the diagnosis. How is this treated? Treatment for this condition depends on the severity. Mild or moderate dehydration can often be treated at home. Treatment should be started right away. Do not wait until dehydration becomes severe. Severe dehydration is an emergency and it needs to be treated in a hospital. Treatment for mild dehydration may include:  Drinking more fluids.  Replacing salts and minerals in your blood (electrolytes) that you may have lost. Treatment for moderate dehydration may include:  Drinking an oral rehydration solution (ORS). This is a drink that helps you replace fluids and electrolytes (rehydrate). It can be found at pharmacies and retail stores. Treatment for severe dehydration may include:  Receiving fluids through an IV tube.  Receiving an electrolyte solution through a feeding tube that is passed through your nose   and into your stomach (nasogastric tube, or NG tube).  Correcting any abnormalities in electrolytes.  Treating the underlying cause of dehydration. Follow these instructions at home:  If directed by your health care provider, drink an ORS: ? Make an ORS by following instructions on the  package. ? Start by drinking small amounts, about  cup (120 mL) every 5-10 minutes. ? Slowly increase how much you drink until you have taken the amount recommended by your health care provider.  Drink enough clear fluid to keep your urine clear or pale yellow. If you were told to drink an ORS, finish the ORS first, then start slowly drinking other clear fluids. Drink fluids such as: ? Water. Do not drink only water. Doing that can lead to having too little salt (sodium) in the body (hyponatremia). ? Ice chips. ? Fruit juice that you have added water to (diluted fruit juice). ? Low-calorie sports drinks.  Avoid: ? Alcohol. ? Drinks that contain a lot of sugar. These include high-calorie sports drinks, fruit juice that is not diluted, and soda. ? Caffeine. ? Foods that are greasy or contain a lot of fat or sugar.  Take over-the-counter and prescription medicines only as told by your health care provider.  Do not take sodium tablets. This can lead to having too much sodium in the body (hypernatremia).  Eat foods that contain a healthy balance of electrolytes, such as bananas, oranges, potatoes, tomatoes, and spinach.  Keep all follow-up visits as told by your health care provider. This is important. Contact a health care provider if:  You have abdominal pain that: ? Gets worse. ? Stays in one area (localizes).  You have a rash.  You have a stiff neck.  You are more irritable than usual.  You are sleepier or more difficult to wake up than usual.  You feel weak or dizzy.  You feel very thirsty.  You have urinated only a small amount of very dark urine over 6-8 hours. Get help right away if:  You have symptoms of severe dehydration.  You cannot drink fluids without vomiting.  Your symptoms get worse with treatment.  You have a fever.  You have a severe headache.  You have vomiting or diarrhea that: ? Gets worse. ? Does not go away.  You have blood or green matter  (bile) in your vomit.  You have blood in your stool. This may cause stool to look black and tarry.  You have not urinated in 6-8 hours.  You faint.  Your heart rate while sitting still is over 100 beats a minute.  You have trouble breathing. This information is not intended to replace advice given to you by your health care provider. Make sure you discuss any questions you have with your health care provider. Document Released: 09/29/2005 Document Revised: 04/25/2016 Document Reviewed: 11/23/2015 Elsevier Interactive Patient Education  2018 Elsevier Inc.  

## 2017-04-06 NOTE — Patient Instructions (Signed)

## 2017-04-07 ENCOUNTER — Other Ambulatory Visit: Payer: Self-pay | Admitting: *Deleted

## 2017-04-07 ENCOUNTER — Telehealth: Payer: Self-pay

## 2017-04-07 MED ORDER — CHOLESTYRAMINE 4 GM/DOSE PO POWD: 4.0000 g | Freq: Two times a day (BID) | ORAL | 12 refills | Status: DC

## 2017-04-07 NOTE — Telephone Encounter (Signed)
This RN obtained prescription for Maria Hester- sent to pharmacy per escribe.  Call returned and obtained VM of pt. Message left per above as well as to call with update tomorrow.

## 2017-04-07 NOTE — Telephone Encounter (Signed)
Sister stated that imodium did not stop diarrhea. Maria Hester had 8-10 BM yesterday. Today she has gone 4x since 0400. She is feeling a lot better than yesterday. She is keeping nausea meds on board, she is keeping up with fluids. Just the diarrhea. Dr Jana Hakim said yesterday that if diarrhea not checked by imodium he would order something stronger.   [facility] uses [facility] in [place]

## 2017-04-09 ENCOUNTER — Encounter (HOSPITAL_COMMUNITY): Payer: Self-pay | Admitting: General Surgery

## 2017-04-18 ENCOUNTER — Telehealth: Payer: Self-pay | Admitting: Hematology

## 2017-04-18 NOTE — Telephone Encounter (Signed)
Pt's sister called to report her bilateral feet and ankle swelling since two days ago, it resolves when she elevated her legs and it appears to be symmetric. She denies any calf pain. No fever or chills. She also reports scatter acne-like skin rash on her face and upper chest. She is drinking fluids adequately.  I explained to her that her rash is related to Herceptin and pejeta. She will use over-the-counter hydrocortisone creams. I think her leg edema is probably related to chemotherapy, not very concerning for DVT. I recommend her to elevate her legs when she sits, and the use compression stocks. Her sister agrees.  She knows to call us if she has new symptoms.  Truitt Merle  04/18/2017

## 2017-04-20 ENCOUNTER — Other Ambulatory Visit: Payer: Self-pay

## 2017-04-20 DIAGNOSIS — Z171 Estrogen receptor negative status [ER-]: Principal | ICD-10-CM

## 2017-04-20 DIAGNOSIS — C50812 Malignant neoplasm of overlapping sites of left female breast: Secondary | ICD-10-CM

## 2017-04-21 ENCOUNTER — Ambulatory Visit (HOSPITAL_BASED_OUTPATIENT_CLINIC_OR_DEPARTMENT_OTHER): Payer: 59 | Admitting: Oncology

## 2017-04-21 ENCOUNTER — Ambulatory Visit (HOSPITAL_BASED_OUTPATIENT_CLINIC_OR_DEPARTMENT_OTHER): Payer: 59

## 2017-04-21 ENCOUNTER — Encounter: Payer: Self-pay | Admitting: *Deleted

## 2017-04-21 ENCOUNTER — Other Ambulatory Visit (HOSPITAL_BASED_OUTPATIENT_CLINIC_OR_DEPARTMENT_OTHER): Payer: 59

## 2017-04-21 VITALS — BP 112/49 | HR 45 | Temp 97.9°F | Resp 17 | Ht 65.5 in | Wt 191.4 lb

## 2017-04-21 DIAGNOSIS — C50812 Malignant neoplasm of overlapping sites of left female breast: Secondary | ICD-10-CM

## 2017-04-21 DIAGNOSIS — Z171 Estrogen receptor negative status [ER-]: Secondary | ICD-10-CM | POA: Diagnosis not present

## 2017-04-21 DIAGNOSIS — Z5189 Encounter for other specified aftercare: Secondary | ICD-10-CM

## 2017-04-21 DIAGNOSIS — Z5111 Encounter for antineoplastic chemotherapy: Secondary | ICD-10-CM

## 2017-04-21 DIAGNOSIS — Z5112 Encounter for antineoplastic immunotherapy: Secondary | ICD-10-CM

## 2017-04-21 LAB — COMPREHENSIVE METABOLIC PANEL
ALBUMIN: 3.2 g/dL — AB (ref 3.5–5.0)
ALK PHOS: 89 U/L (ref 40–150)
ALT: 32 U/L (ref 0–55)
AST: 18 U/L (ref 5–34)
Anion Gap: 12 mEq/L — ABNORMAL HIGH (ref 3–11)
BILIRUBIN TOTAL: 0.22 mg/dL (ref 0.20–1.20)
BUN: 15.2 mg/dL (ref 7.0–26.0)
CALCIUM: 9.9 mg/dL (ref 8.4–10.4)
CO2: 25 mEq/L (ref 22–29)
CREATININE: 0.7 mg/dL (ref 0.6–1.1)
Chloride: 105 mEq/L (ref 98–109)
EGFR: >90 mL/min/{1.73_m2} (ref >90)
Glucose: 95 mg/dl (ref 70–140)
Potassium: 4.7 mEq/L (ref 3.5–5.1)
Sodium: 141 mEq/L (ref 136–145)
TOTAL PROTEIN: 6.9 g/dL (ref 6.4–8.3)

## 2017-04-21 LAB — CBC WITH DIFFERENTIAL/PLATELET
BASO%: 0.2 % (ref 0.0–2.0)
Basophils Absolute: 0 10*3/uL (ref 0.0–0.1)
EOS%: 0 % (ref 0.0–7.0)
Eosinophils Absolute: 0 10*3/uL (ref 0.0–0.5)
HEMATOCRIT: 33.9 % — AB (ref 34.8–46.6)
HEMOGLOBIN: 11.3 g/dL — AB (ref 11.6–15.9)
LYMPH#: 2.1 10*3/uL (ref 0.9–3.3)
LYMPH%: 10.8 % — ABNORMAL LOW (ref 14.0–49.7)
MCH: 29.2 pg (ref 25.1–34.0)
MCHC: 33.3 g/dL (ref 31.5–36.0)
MCV: 87.8 fL (ref 79.5–101.0)
MONO#: 1.1 10*3/uL — ABNORMAL HIGH (ref 0.1–0.9)
MONO%: 5.6 % (ref 0.0–14.0)
NEUT%: 83.4 % — ABNORMAL HIGH (ref 38.4–76.8)
NEUTROS ABS: 16 10*3/uL — AB (ref 1.5–6.5)
Platelets: 344 10*3/uL (ref 145–400)
RBC: 3.86 10*6/uL (ref 3.70–5.45)
RDW: 14.2 % (ref 11.2–14.5)
WBC: 19.2 10*3/uL — ABNORMAL HIGH (ref 3.9–10.3)

## 2017-04-21 MED ORDER — SODIUM CHLORIDE 0.9 % IV SOLN
Freq: Once | INTRAVENOUS | Status: AC
  Administered 2017-04-21: 10:00:00 via INTRAVENOUS

## 2017-04-21 MED ORDER — PALONOSETRON HCL INJECTION 0.25 MG/5ML
INTRAVENOUS | Status: AC
  Filled 2017-04-21: qty 5

## 2017-04-21 MED ORDER — HEPARIN SOD (PORK) LOCK FLUSH 100 UNIT/ML IV SOLN
500.0000 [IU] | Freq: Once | INTRAVENOUS | Status: AC | PRN
  Administered 2017-04-21: 500 [IU]
  Filled 2017-04-21: qty 5

## 2017-04-21 MED ORDER — ACETAMINOPHEN 325 MG PO TABS
650.0000 mg | ORAL_TABLET | Freq: Once | ORAL | Status: AC
  Administered 2017-04-21: 650 mg via ORAL

## 2017-04-21 MED ORDER — TRASTUZUMAB CHEMO 150 MG IV SOLR
6.0000 mg/kg | Freq: Once | INTRAVENOUS | Status: AC
  Administered 2017-04-21: 525 mg via INTRAVENOUS
  Filled 2017-04-21: qty 25

## 2017-04-21 MED ORDER — DOCETAXEL CHEMO INJECTION 160 MG/16ML
75.0000 mg/m2 | Freq: Once | INTRAVENOUS | Status: AC
  Administered 2017-04-21: 150 mg via INTRAVENOUS
  Filled 2017-04-21: qty 15

## 2017-04-21 MED ORDER — DIPHENHYDRAMINE HCL 25 MG PO CAPS
ORAL_CAPSULE | ORAL | Status: AC
  Filled 2017-04-21: qty 1

## 2017-04-21 MED ORDER — DIPHENHYDRAMINE HCL 25 MG PO CAPS
25.0000 mg | ORAL_CAPSULE | Freq: Once | ORAL | Status: AC
Start: 2017-04-21 — End: 2017-04-21
  Administered 2017-04-21: 25 mg via ORAL

## 2017-04-21 MED ORDER — METRONIDAZOLE 1 % EX GEL: Freq: Every day | CUTANEOUS | 0 refills | Status: DC

## 2017-04-21 MED ORDER — SODIUM CHLORIDE 0.9% FLUSH
10.0000 mL | INTRAVENOUS | Status: DC | PRN
  Administered 2017-04-21: 10 mL
  Filled 2017-04-21: qty 10

## 2017-04-21 MED ORDER — ACETAMINOPHEN 325 MG PO TABS
ORAL_TABLET | ORAL | Status: AC
  Filled 2017-04-21: qty 2

## 2017-04-21 MED ORDER — PALONOSETRON HCL INJECTION 0.25 MG/5ML
0.2500 mg | Freq: Once | INTRAVENOUS | Status: AC
  Administered 2017-04-21: 0.25 mg via INTRAVENOUS

## 2017-04-21 MED ORDER — SODIUM CHLORIDE 0.9 % IV SOLN
702.5000 mg | Freq: Once | INTRAVENOUS | Status: AC
  Administered 2017-04-21: 700 mg via INTRAVENOUS
  Filled 2017-04-21: qty 70

## 2017-04-21 MED ORDER — DEXAMETHASONE SODIUM PHOSPHATE 10 MG/ML IJ SOLN
INTRAMUSCULAR | Status: AC
  Filled 2017-04-21: qty 1

## 2017-04-21 MED ORDER — DEXAMETHASONE SODIUM PHOSPHATE 10 MG/ML IJ SOLN
10.0000 mg | Freq: Once | INTRAMUSCULAR | Status: AC
  Administered 2017-04-21: 10 mg via INTRAVENOUS

## 2017-04-21 MED ORDER — PEGFILGRASTIM 6 MG/0.6ML ~~LOC~~ PSKT
6.0000 mg | PREFILLED_SYRINGE | Freq: Once | SUBCUTANEOUS | Status: AC
  Administered 2017-04-21: 6 mg via SUBCUTANEOUS
  Filled 2017-04-21: qty 0.6

## 2017-04-21 NOTE — Progress Notes (Signed)
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 Today is day 1 cycle 2 of 6 planned cycles of carboplatin, docetaxel, and anti-HER-2 immunotherapy. She received Pertuzumab with the first cycle but had significant problems with diarrhea and we are holding the Pertuzumab with cycle 2.  REVIEW OF SYSTEMS: She had a little bit of a "cold", with mild epistaxis when she blew her nose, and was treated with antibiotics by Dr. Margo Aye. She never had a fever. She says those problems are now much better. She has some ankle swelling at times. She gets short of breath when climbing stairs, but otherwise it detailed review of systems today was noncontributory.   BREAST CANCER HISTORY: From the original intake note:  [name] herself noted a change in her left breast when looking into a mirror and then palpating a mass in the upper-outer quadrant. She brought this to medical attention and on 02/24/2017 underwent bilateral diagnostic mammography with tomography and left breast ultrasonography at the breast Center. The breast density was category C. In the upper portion of the left breast there was a broad area of distortion measuring up to 7.5 cm. On exam there was a broad firm area involving the upper  outer quadrant of the left breast which was visibly protruding. By ultrasound at the 11:00 axis 8 cm from the nipple there was an irregular hypoechoic mass measuring 1.4 cm with additiona ases at 2:00, 3 cm from the nipple and multiple other masses as described, all in the upper-outer quadrant primarily. Ultrasound of the left axilla showed at least 3 prominent lymph nodes one of which had a cortical bulge.  On 03/02/2017 the patient underwent biopsy of a left breast mass at the 11:30 o'clock position and a second mass described as upper outer quadrant as well as one of the suspicious left axillary lymph nodes. These all showed invasive ductal carcinoma, grade 3. Prognostic panel from one of the 2 masses showed the tumor to be estrogen and progesterone receptor negative, but HER-2 amplified, with a signals ratio of 8.24 and the number per cell 15.65.  Her subsequent history is as detailed below   PAST MEDICAL HISTORY: Past Medical History:  Diagnosis Date  . Breast cancer ([place])   . 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 Complication of anesthesia    pt states BP tends to drop and has a hard time waking up - pt was told by prior anesthesiologist to make sure this was made known for furture surgeries  . Uterine fibroid     PAST SURGICAL HISTORY: Past Surgical History:  Procedure Laterality Date  . ABDOMINAL HYSTERECTOMY    . ABDOMINOPLASTY N/A 02/28/2014   Procedure: ABDOMINOPLASTY with Panniculectomy;  Surgeon: [name], MD;  Location: [facility];  Service: Gynecology;  Laterality: N/A;  . APPENDECTOMY    . BILATERAL SALPINGECTOMY Bilateral  02/28/2014   Procedure: BILATERAL SALPINGECTOMY;  Surgeon: [name], MD;  Location: [facility];  Service: Gynecology;  Laterality: Bilateral;  . CATARACT EXTRACTION, BILATERAL    . PORTACATH PLACEMENT Right 03/18/2017   Procedure: INSERTION PORT-A-CATH;  Surgeon: Excell Seltzer, MD;   Location: [facility];  Service: General;  Laterality: Right;  . reverse tubal ligation    . SCAR REVISION N/A 02/28/2014   Procedure: SCAR REVISION;  Surgeon: [name], MD;  Location: [facility];  Service: Gynecology;  Laterality: N/A;  . SUPRACERVICAL ABDOMINAL HYSTERECTOMY N/A 02/28/2014   Procedure: HYSTERECTOMY SUPRACERVICAL ABDOMINAL;  Surgeon: [name], MD;  Location: [facility];  Service: Gynecology;  Laterality: N/A;  . TUBAL LIGATION    . WISDOM TOOTH EXTRACTION      FAMILY HISTORY Family History  Problem Relation Age of Onset  . Heart disease Mother   . Hypertension Mother   . Diabetes Father   . Heart disease Father   . Cancer Maternal Grandmother        intestines  . Heart disease Maternal Grandmother   . Heart disease Maternal Grandfather   . Cancer Maternal Grandfather        esophageal  . Diabetes Paternal Grandmother   . Heart disease Paternal Grandmother   . Kidney cancer Paternal Grandmother   . Tuberculosis Paternal Grandfather   . Heart disease Paternal Grandfather   . Melanoma Paternal Grandfather   . Breast cancer Maternal Aunt   . Breast cancer Cousin   The patient's father died from heart disease at age 76. The patient's mother is living at age 70. The patient has one brother, 2 sisters. On the mother's side and aunt had breast cancer at age 62 and her daughter had breast cancer in her early 110s. There is also a grandfather with esophageal cancer and a grandmother with colon cancer. On the paternal side there is a grandmother with kidney cancer at an early age and a grandfather with melanoma at an early age.  GYNECOLOGIC HISTORY:  Patient's last menstrual period was 02/11/2014.  Menarche age 54, first live birth age 59, the patient is [place] P2. She underwent hysterectomy without salpingo-oophorectomy May 2016. She did not use hormone replacement. She did use oral contraceptives approximately 10 years in the 1980s.  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 Her husband Fish farm manager in the same [place], which is a 10% aviation enrollment. Daughter Lorenda Hatchet is a hairstylist in [place] and son Chip Boer is a Physiological scientist in [place] the patient has 2 grandsons and one granddaughter. She attends a [employer]    ADVANCED DIRECTIVES: The patient has completed advanced directives and will have the motorized at the next visit   HEALTH MAINTENANCE: Social History  Substance Use Topics  . Smoking status: Former Smoker    Packs/day: 0.50    Years: 34.00    Types: Cigarettes  . Smokeless tobacco: Never Used  . Alcohol use No     Comment: social drink      Colonoscopy:No  PAP: 03/04/2017  Bone density: No   No Known Allergies  Current Outpatient Prescriptions  Medication Sig Dispense Refill  . ALPRAZolam (XANAX) 0.5 MG tablet TAKE HALF TO 1 TABLET BY MOUTH EVERY 8 HOURS AS NEEDED FOR ANXIETY  2  . cholestyramine (QUESTRAN) 4 GM/DOSE powder Take 1 packet (4 g total) by mouth 2 (two) times daily. 378 g 12  .  dexamethasone (DECADRON) 4 MG tablet Take 2 tablets (8 mg total) by mouth 2 (two) times daily. Start the day before Taxotere. Then again the day after chemo for 3 days. 30 tablet 1  . Hydrocodone-Chlorpheniramine 5-4 MG/5ML SOLN Take 5 mLs by mouth every 12 (twelve) hours as needed.    . lidocaine-prilocaine (EMLA) cream Apply to affected area once 30 g 3  . LORazepam (ATIVAN) 0.5 MG tablet Take 1 tablet (0.5 mg total) by mouth at bedtime as needed (Nausea or vomiting). 30 tablet 0  . metroNIDAZOLE (METROGEL) 1 % gel Apply topically daily. 45 g 0  . ondansetron (ZOFRAN) 8 MG tablet Take 1 tablet (8 mg total) by mouth every 8 (eight) hours as needed for nausea or vomiting. 30 tablet 3  . prochlorperazine (COMPAZINE) 10 MG tablet Take 1 tablet (10 mg total) by mouth every 6 (six) hours as needed (Nausea or vomiting). 30 tablet 1   No current  facility-administered medications for this visit.     OBJECTIVE: Middle-aged white woman In no acute distress  Vitals:   04/21/17 0933  BP: (!) 112/49  Pulse: (!) 45  Resp: 17  Temp: 97.9 F (36.6 C)     Body mass index is 31.37 kg/m.    ECOG FS:1 - Symptomatic but completely ambulatory  Sclerae unicteric, EOMs intact Oropharynx clear and moist No cervical or supraclavicular adenopathy Lungs no rales or rhonchi Heart regular rate and rhythm Abd soft, nontender, positive bowel sounds MSK no focal spinal tenderness, no upper extremity lymphedema Neuro: nonfocal, well oriented, appropriate affect Breasts: Deferred    LAB RESULTS:  CMP     Component Value Date/Time   NA 141 04/21/2017 0903   K 4.7 04/21/2017 0903   CL 106 03/01/2014 0607   CO2 25 04/21/2017 0903   GLUCOSE 95 04/21/2017 0903   BUN 15.2 04/21/2017 0903   CREATININE 0.7 04/21/2017 0903   CALCIUM 9.9 04/21/2017 0903   PROT 6.9 04/21/2017 0903   ALBUMIN 3.2 (L) 04/21/2017 0903   AST 18 04/21/2017 0903   ALT 32 04/21/2017 0903   ALKPHOS 89 04/21/2017 0903   BILITOT 0.22 04/21/2017 0903   GFRNONAA >90 03/01/2014 0607   GFRAA >90 03/01/2014 0607    No results found for: TOTALPROTELP, ALBUMINELP, A1GS, A2GS, BETS, BETA2SER, GAMS, MSPIKE, SPEI  No results found for: Nils Pyle, [facility]  Lab Results  Component Value Date   WBC 19.2 (H) 04/21/2017   NEUTROABS 16.0 (H) 04/21/2017   HGB 11.3 (L) 04/21/2017   HCT 33.9 (L) 04/21/2017   MCV 87.8 04/21/2017   PLT 344 04/21/2017      Chemistry      Component Value Date/Time   NA 141 04/21/2017 0903   K 4.7 04/21/2017 0903   CL 106 03/01/2014 0607   CO2 25 04/21/2017 0903   BUN 15.2 04/21/2017 0903   CREATININE 0.7 04/21/2017 0903      Component Value Date/Time   CALCIUM 9.9 04/21/2017 0903   ALKPHOS 89 04/21/2017 0903   AST 18 04/21/2017 0903   ALT 32 04/21/2017 0903   BILITOT 0.22 04/21/2017 0903       No results found  for: LABCA2  No components found for: [id number]  No results for input(s): INR in the last 168 hours.  Urinalysis    Component Value Date/Time   COLORURINE YELLOW 02/23/2014 1004   APPEARANCEUR CLEAR 02/23/2014 1004   LABSPEC 1.025 02/23/2014 1004   PHURINE 5.5 02/23/2014 1004   GLUCOSEU NEGATIVE  02/23/2014 [place] 02/23/2014 [place] 02/23/2014 [place] 02/23/2014 1004   PROTEINUR NEGATIVE 02/23/2014 1004   UROBILINOGEN 0.2 02/23/2014 1004   NITRITE NEGATIVE 02/23/2014 1004   LEUKOCYTESUR NEGATIVE 02/23/2014 1004     STUDIES: No results found.  ELIGIBLE FOR AVAILABLE RESEARCH PROTOCOL: [place] study  ASSESSMENT: 51 y.o. [place], [place] woman status post left breast overlapping sites biopsy 03/02/2017 for a clinical T3 N1-2, stage IIIA invasive ductal carcinoma, grade 3, estrogen and progesterone receptor negative, HER-2 amplified, with an MIB-1 of 35%  (1) genetics testing 04/06/2017  (2) neoadjuvant chemotherapy will consist of carboplatin and Taxotere given with trastuzumab and Pertuzumab every 21 days for 6 cycles, starting 03/31/2017  (a) Pertuzumab stopped after cycle 1 because of severe diarrhea complications  (3) trastuzumab will be continued to complete one year  (a) echocardiogram 03/20/2017 showed an ejection fraction of 55%  (4) definitive surgery to follow chemotherapy  (5) adjuvant radiation to follow surgery    PLAN: Maria Hester is ready to proceed to cycle 2 today. The main change we are making is a lemonade during the Pertuzumab which she tolerated very poorly. I am also adding ondansetron which she can start on day 3 to see if we can control the nausea little bit better than we did last time. Hopefully with these changes she will be able to tolerate treatment with fewer problems  She is scheduled to see me 04/28/2017 to troubleshoot side effects. She will see genetics on the same day.  I have encouraged her to  call with any problems that may develop over the next few days so we can intervene before the weekend comes, bringing her in for fluids if necessary.    Chauncey Cruel, MD   04/22/2017 5:56 PM    Medical Oncology and Hematology [facility] [address] [place], [place] [address] Tel. [phone]    Fax. [phone]

## 2017-04-21 NOTE — Patient Instructions (Signed)
[facility] Discharge Instructions for Patients Receiving Chemotherapy  Today you received the following chemotherapy agents: Herceptin, Taxotere and Carboplatin   To help prevent nausea and vomiting after your treatment, we encourage you to take your nausea medication as directed.    If you develop nausea and vomiting that is not controlled by your nausea medication, call the clinic.   BELOW ARE SYMPTOMS THAT SHOULD BE REPORTED IMMEDIATELY:  *FEVER GREATER THAN 100.5 F  *CHILLS WITH OR WITHOUT FEVER  NAUSEA AND VOMITING THAT IS NOT CONTROLLED WITH YOUR NAUSEA MEDICATION  *UNUSUAL SHORTNESS OF BREATH  *UNUSUAL BRUISING OR BLEEDING  TENDERNESS IN MOUTH AND THROAT WITH OR WITHOUT PRESENCE OF ULCERS  *URINARY PROBLEMS  *BOWEL PROBLEMS  UNUSUAL RASH Items with * indicate a potential emergency and should be followed up as soon as possible.  Feel free to call the clinic you have any questions or concerns. The clinic phone number is [phone].  Please show the [facility] at check-in to the Emergency Department and triage nurse.

## 2017-04-27 ENCOUNTER — Other Ambulatory Visit: Payer: Self-pay | Admitting: Emergency Medicine

## 2017-04-27 DIAGNOSIS — Z171 Estrogen receptor negative status [ER-]: Principal | ICD-10-CM

## 2017-04-27 DIAGNOSIS — C50812 Malignant neoplasm of overlapping sites of left female breast: Secondary | ICD-10-CM

## 2017-04-28 ENCOUNTER — Ambulatory Visit (HOSPITAL_BASED_OUTPATIENT_CLINIC_OR_DEPARTMENT_OTHER): Payer: 59 | Admitting: Oncology

## 2017-04-28 ENCOUNTER — Ambulatory Visit (HOSPITAL_BASED_OUTPATIENT_CLINIC_OR_DEPARTMENT_OTHER): Payer: 59

## 2017-04-28 ENCOUNTER — Ambulatory Visit (HOSPITAL_BASED_OUTPATIENT_CLINIC_OR_DEPARTMENT_OTHER): Payer: 59 | Admitting: Genetics

## 2017-04-28 ENCOUNTER — Encounter: Payer: 59 | Admitting: Genetics

## 2017-04-28 ENCOUNTER — Other Ambulatory Visit (HOSPITAL_BASED_OUTPATIENT_CLINIC_OR_DEPARTMENT_OTHER): Payer: 59

## 2017-04-28 ENCOUNTER — Other Ambulatory Visit: Payer: 59

## 2017-04-28 VITALS — BP 112/62 | HR 85 | Temp 97.8°F | Resp 20 | Ht 65.5 in | Wt 183.8 lb

## 2017-04-28 DIAGNOSIS — C50812 Malignant neoplasm of overlapping sites of left female breast: Secondary | ICD-10-CM

## 2017-04-28 DIAGNOSIS — Z95828 Presence of other vascular implants and grafts: Secondary | ICD-10-CM

## 2017-04-28 DIAGNOSIS — Z171 Estrogen receptor negative status [ER-]: Secondary | ICD-10-CM

## 2017-04-28 DIAGNOSIS — Z315 Encounter for genetic counseling: Secondary | ICD-10-CM

## 2017-04-28 DIAGNOSIS — Z803 Family history of malignant neoplasm of breast: Secondary | ICD-10-CM

## 2017-04-28 LAB — COMPREHENSIVE METABOLIC PANEL
ALT: 26 U/L (ref 0–55)
AST: 16 U/L (ref 5–34)
Albumin: 3.4 g/dL — ABNORMAL LOW (ref 3.5–5.0)
Alkaline Phosphatase: 113 U/L (ref 40–150)
Anion Gap: 8 mEq/L (ref 3–11)
BILIRUBIN TOTAL: 0.25 mg/dL (ref 0.20–1.20)
BUN: 14.7 mg/dL (ref 7.0–26.0)
CO2: 26 meq/L (ref 22–29)
CREATININE: 0.7 mg/dL (ref 0.6–1.1)
Calcium: 9.2 mg/dL (ref 8.4–10.4)
Chloride: 104 mEq/L (ref 98–109)
EGFR: >90 mL/min/{1.73_m2} (ref >90)
GLUCOSE: 134 mg/dL (ref 70–140)
Potassium: 3.7 mEq/L (ref 3.5–5.1)
SODIUM: 138 meq/L (ref 136–145)
TOTAL PROTEIN: 6.3 g/dL — AB (ref 6.4–8.3)

## 2017-04-28 LAB — CBC WITH DIFFERENTIAL/PLATELET
BASO%: 0.1 % (ref 0.0–2.0)
Basophils Absolute: 0 10*3/uL (ref 0.0–0.1)
EOS%: 0.1 % (ref 0.0–7.0)
Eosinophils Absolute: 0 10*3/uL (ref 0.0–0.5)
HCT: 37.5 % (ref 34.8–46.6)
HGB: 11.9 g/dL (ref 11.6–15.9)
LYMPH%: 26.6 % (ref 14.0–49.7)
MCH: 28.6 pg (ref 25.1–34.0)
MCHC: 31.7 g/dL (ref 31.5–36.0)
MCV: 90.1 fL (ref 79.5–101.0)
MONO#: 1.5 10*3/uL — AB (ref 0.1–0.9)
MONO%: 18.3 % — AB (ref 0.0–14.0)
NEUT%: 54.9 % (ref 38.4–76.8)
NEUTROS ABS: 4.6 10*3/uL (ref 1.5–6.5)
PLATELETS: 244 10*3/uL (ref 145–400)
RBC: 4.16 10*6/uL (ref 3.70–5.45)
RDW: 14.5 % (ref 11.2–14.5)
WBC: 8.3 10*3/uL (ref 3.9–10.3)
lymph#: 2.2 10*3/uL (ref 0.9–3.3)

## 2017-04-28 MED ORDER — SODIUM CHLORIDE 0.9% FLUSH
10.0000 mL | Freq: Once | INTRAVENOUS | Status: AC
  Administered 2017-04-28: 10 mL
  Filled 2017-04-28: qty 10

## 2017-04-28 MED ORDER — HEPARIN SOD (PORK) LOCK FLUSH 100 UNIT/ML IV SOLN
500.0000 [IU] | Freq: Once | INTRAVENOUS | Status: AC
  Administered 2017-04-28: 500 [IU]
  Filled 2017-04-28: qty 5

## 2017-04-28 NOTE — Progress Notes (Signed)
[place]  [phone] [phone]     ID: Maria Hester DOB: 05-01-1966  MR#: [id number]  KUV#:[id number]  Patient Care Team: Maria Kind, MD as PCP - General (Obstetrics and Gynecology) Maria Seltzer, MD as Consulting Physician (General Surgery) Maria Hester, Maria Dad, MD as Consulting Physician (Oncology) Maria Gibson, MD as Attending Physician (Radiation Oncology) Maria Sams, MD as Referring Physician (Internal Medicine) Maria Cruel, MD OTHER MD:  CHIEF COMPLAINT: HER-2 positive estrogen receptor negative breast cancer  CURRENT TREATMENT: Neoadjuvant chemotherapy  INTERVAL HISTORY: Maria Hester returns today for follow-up and treatment of her estrogen receptor negative, HER-2 positive breast cancer accompanied by her husband and sister. Today is day 8 cycle 2 of 6 planned cycles of carboplatin/docetaxel given with trastuzumab. Pertuzumab was held after cycle 1 because of severe diarrhea problems.  REVIEW OF SYSTEMS: Maria Hester did considerably better with this cycle. As far as the diarrhea is concerned she had a total of 5 loose bowel movements over 2 days, not watery as before. That has resolved. She has no nausea. She did lose her hair. She is wearing bandanna [place] because she feels wakes would be too hot. She has had no problems with nausea. She has had significant taste alteration which is her most troublesome symptom at present. She has found a little bit of sugar helps but she is afraid of using that because of weight issues. Interestingly she tells me when she has a cold drink she does not notice that it is cold. A detailed review of systems today was otherwise stable  BREAST CANCER HISTORY: From the original intake note:  Maria Hester herself noted a change in her left breast when looking into a mirror and then palpating a mass in the upper-outer quadrant. She brought this to medical attention and on 02/24/2017 underwent bilateral  diagnostic mammography with tomography and left breast ultrasonography at the breast Center. The breast density was category C. In the upper portion of the left breast there was a broad area of distortion measuring up to 7.5 cm. On exam there was a broad firm area involving the upper outer quadrant of the left breast which was visibly protruding. By ultrasound at the 11:00 axis 8 cm from the nipple there was an irregular hypoechoic mass measuring 1.4 cm with additiona ases at 2:00, 3 cm from the nipple and multiple other masses as described, all in the upper-outer quadrant primarily. Ultrasound of the left axilla showed at least 3 prominent lymph nodes one of which had a cortical bulge.  On 03/02/2017 the patient underwent biopsy of a left breast mass at the 11:30 o'clock position and a second mass described as upper outer quadrant as well as one of the suspicious left axillary lymph nodes. These all showed invasive ductal carcinoma, grade 3. Prognostic panel from one of the 2 masses showed the tumor to be estrogen and progesterone receptor negative, but HER-2 amplified, with a signals ratio of 8.24 and the number per cell 15.65.  Her subsequent history is as detailed below   PAST MEDICAL HISTORY: Past Medical History:  Diagnosis Date  . Breast cancer ([place])   . 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 Complication of anesthesia    pt states BP tends to drop and has a hard time waking up - pt was told by prior anesthesiologist to make sure this was made known for furture surgeries  . Uterine fibroid  PAST SURGICAL HISTORY: Past Surgical History:  Procedure Laterality Date  . ABDOMINAL HYSTERECTOMY    . ABDOMINOPLASTY N/A 02/28/2014   Procedure: ABDOMINOPLASTY with Panniculectomy;  Surgeon: Maria Kind, MD;  Location: [facility];  Service: Gynecology;  Laterality: N/A;  . APPENDECTOMY    . BILATERAL SALPINGECTOMY Bilateral 02/28/2014    Procedure: BILATERAL SALPINGECTOMY;  Surgeon: Maria Kind, MD;  Location: [facility];  Service: Gynecology;  Laterality: Bilateral;  . CATARACT EXTRACTION, BILATERAL    . PORTACATH PLACEMENT Right 03/18/2017   Procedure: INSERTION PORT-A-CATH;  Surgeon: Maria Seltzer, MD;  Location: [facility];  Service: General;  Laterality: Right;  . reverse tubal ligation    . SCAR REVISION N/A 02/28/2014   Procedure: SCAR REVISION;  Surgeon: Maria Kind, MD;  Location: [facility];  Service: Gynecology;  Laterality: N/A;  . SUPRACERVICAL ABDOMINAL HYSTERECTOMY N/A 02/28/2014   Procedure: HYSTERECTOMY SUPRACERVICAL ABDOMINAL;  Surgeon: Maria Kind, MD;  Location: [facility];  Service: Gynecology;  Laterality: N/A;  . TUBAL LIGATION    . WISDOM TOOTH EXTRACTION      FAMILY HISTORY Family History  Problem Relation Age of Onset  . Heart disease Mother   . Hypertension Mother   . Diabetes Father   . Heart disease Father   . Cancer Maternal Grandmother        intestines  . Heart disease Maternal Grandmother   . Heart disease Maternal Grandfather   . Cancer Maternal Grandfather        esophageal  . Diabetes Paternal Grandmother   . Heart disease Paternal Grandmother   . Kidney cancer Paternal Grandmother   . Tuberculosis Paternal Grandfather   . Heart disease Paternal Grandfather   . Melanoma Paternal Grandfather   . Breast cancer Maternal Aunt   . Breast cancer Cousin   The patient's father died from heart disease at age 20. The patient's mother is living at age 31. The patient has one brother, 2 sisters. On the mother's side and aunt had breast cancer at age 27 and her daughter had breast cancer in her early 44s. There is also a grandfather with esophageal cancer and a grandmother with colon cancer. On the paternal side there is a grandmother with kidney cancer at an early age and a grandfather with melanoma at an early age.  GYNECOLOGIC HISTORY:  Patient's last menstrual period was 02/11/2014.    Menarche age 46, first live birth age 58, the patient is [place] P2. She underwent hysterectomy without salpingo-oophorectomy May 2016. She did not use hormone replacement. She did use oral contraceptives approximately 10 years in the 1980s.  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 Her husband Fish farm manager in the same [place], which is a 10% aviation enrollment. Daughter Lorenda Hatchet is a hairstylist in [place] and son Chip Boer is a Physiological scientist in [place] the patient has 2 grandsons and one granddaughter. She attends a [employer]    ADVANCED DIRECTIVES: The patient has completed advanced directives and will have the motorized at the next visit   HEALTH MAINTENANCE: Social History  Substance Use Topics  . Smoking status: Former Smoker    Packs/day: 0.50    Years: 34.00    Types: Cigarettes  . Smokeless tobacco: Never Used  . Alcohol use No     Comment: social drink      Colonoscopy:No  PAP: 03/04/2017  Bone density: No   No Known Allergies  Current  Outpatient Prescriptions  Medication Sig Dispense Refill  . ALPRAZolam (XANAX) 0.5 MG tablet TAKE HALF TO 1 TABLET BY MOUTH EVERY 8 HOURS AS NEEDED FOR ANXIETY  2  . cholestyramine (QUESTRAN) 4 GM/DOSE powder Take 1 packet (4 g total) by mouth 2 (two) times daily. 378 g 12  . dexamethasone (DECADRON) 4 MG tablet Take 2 tablets (8 mg total) by mouth 2 (two) times daily. Start the day before Taxotere. Then again the day after chemo for 3 days. 30 tablet 1  . Hydrocodone-Chlorpheniramine 5-4 MG/5ML SOLN Take 5 mLs by mouth every 12 (twelve) hours as needed.    . lidocaine-prilocaine (EMLA) cream Apply to affected area once 30 g 3  . LORazepam (ATIVAN) 0.5 MG tablet Take 1 tablet (0.5 mg total) by mouth at bedtime as needed (Nausea or vomiting). 30 tablet 0  . metroNIDAZOLE (METROGEL) 1 % gel Apply topically daily. 45 g 0  . ondansetron (ZOFRAN) 8 MG  tablet Take 1 tablet (8 mg total) by mouth every 8 (eight) hours as needed for nausea or vomiting. 30 tablet 3  . prochlorperazine (COMPAZINE) 10 MG tablet Take 1 tablet (10 mg total) by mouth every 6 (six) hours as needed (Nausea or vomiting). 30 tablet 1   No current facility-administered medications for this visit.     OBJECTIVE: Middle-aged white woman Who appears well  Vitals:   04/28/17 1228  BP: 112/62  Pulse: 85  Resp: 20  Temp: 97.8 F (36.6 C)     Body mass index is 30.12 kg/m.    ECOG FS:1 - Symptomatic but completely ambulatory  Sclerae unicteric, pupils round and equal Oropharynx clear and moist No cervical or supraclavicular adenopathy Lungs no rales or rhonchi Heart regular rate and rhythm Abd soft, nontender, positive bowel sounds MSK no focal spinal tenderness, no upper extremity lymphedema Neuro: nonfocal, well oriented, appropriate affect Breasts: Deferred  LAB RESULTS:  CMP     Component Value Date/Time   NA 138 04/28/2017 1152   K 3.7 04/28/2017 1152   CL 106 03/01/2014 0607   CO2 26 04/28/2017 1152   GLUCOSE 134 04/28/2017 1152   BUN 14.7 04/28/2017 1152   CREATININE 0.7 04/28/2017 1152   CALCIUM 9.2 04/28/2017 1152   PROT 6.3 (L) 04/28/2017 1152   ALBUMIN 3.4 (L) 04/28/2017 1152   AST 16 04/28/2017 1152   ALT 26 04/28/2017 1152   ALKPHOS 113 04/28/2017 1152   BILITOT 0.25 04/28/2017 1152   GFRNONAA >90 03/01/2014 0607   GFRAA >90 03/01/2014 0607    No results found for: Ronnald Ramp, A1GS, A2GS, BETS, BETA2SER, GAMS, MSPIKE, SPEI  No results found for: Nils Pyle, [facility]  Lab Results  Component Value Date   WBC 8.3 04/28/2017   NEUTROABS 4.6 04/28/2017   HGB 11.9 04/28/2017   HCT 37.5 04/28/2017   MCV 90.1 04/28/2017   PLT 244 04/28/2017      Chemistry      Component Value Date/Time   NA 138 04/28/2017 1152   K 3.7 04/28/2017 1152   CL 106 03/01/2014 0607   CO2 26 04/28/2017 1152   BUN 14.7  04/28/2017 1152   CREATININE 0.7 04/28/2017 1152      Component Value Date/Time   CALCIUM 9.2 04/28/2017 1152   ALKPHOS 113 04/28/2017 1152   AST 16 04/28/2017 1152   ALT 26 04/28/2017 1152   BILITOT 0.25 04/28/2017 1152       No results found for: LABCA2  No components found for:  [id number]  No results for input(s): INR in the last 168 hours.  Urinalysis    Component Value Date/Time   COLORURINE YELLOW 02/23/2014 1004   APPEARANCEUR CLEAR 02/23/2014 1004   LABSPEC 1.025 02/23/2014 1004   PHURINE 5.5 02/23/2014 1004   GLUCOSEU NEGATIVE 02/23/2014 1004   HGBUR NEGATIVE 02/23/2014 1004   BILIRUBINUR NEGATIVE 02/23/2014 1004   KETONESUR NEGATIVE 02/23/2014 1004   PROTEINUR NEGATIVE 02/23/2014 1004   UROBILINOGEN 0.2 02/23/2014 1004   NITRITE NEGATIVE 02/23/2014 1004   LEUKOCYTESUR NEGATIVE 02/23/2014 1004     STUDIES: Bone scan and CT of the chest from 03/20/2017 briefly reviewed with patient  ELIGIBLE FOR AVAILABLE RESEARCH PROTOCOL: [place] study  ASSESSMENT: 51 y.o. [place], [place] woman status post left breast overlapping sites biopsy 03/02/2017 for a clinical T3 N1-2, stage IIIA invasive ductal carcinoma, grade 3, estrogen and progesterone receptor negative, HER-2 amplified, with an MIB-1 of 35%  (1) genetics testing 04/06/2017  (2) neoadjuvant chemotherapy will consist of carboplatin and Docetaxel given with trastuzumab and Pertuzumab every 21 days for 6 cycles, starting 03/31/2017  (a) Pertuzumab stopped after cycle 1 because of severe diarrhea complications  (3) trastuzumab will be continued to complete one year  (a) echocardiogram 03/20/2017 showed an ejection fraction of 55%   (4) definitive surgery to follow chemotherapy  (5) adjuvant radiation to follow surgery   PLAN: Lajune tolerated cycle 2 much better, largely because we omitted the Pertuzumab. As noted above she only had mild diarrhea over a 2 day period. She break the hair loss without major  psychological stress.  She will return 05/12/2017 for cycle #3 and she will see me a week later. If all is going well at that time, we will consider adding Pertuzumab half dose with cycle 4.  She has a good understanding of the overall plan. She knows to call for any other problems that may develop before her next visit.  Maria Cruel, MD   04/22/2017 5:56 PM Medical Oncology and Hematology [facility] [address] [place], [place] [address] Tel. [phone]    Fax. [phone]

## 2017-04-29 ENCOUNTER — Encounter: Payer: Self-pay | Admitting: Genetics

## 2017-04-29 DIAGNOSIS — Z803 Family history of malignant neoplasm of breast: Secondary | ICD-10-CM | POA: Insufficient documentation

## 2017-04-29 NOTE — Progress Notes (Addendum)
REFERRING PROVIDER: Chauncey Cruel, MD [address] [place], [place] [address]  PRIMARY PROVIDER:  [name], MD  PRIMARY REASON FOR VISIT:  1. Family history of breast cancer   2. Malignant neoplasm of overlapping sites of left breast in female, estrogen receptor negative ([place])    HISTORY OF PRESENT ILLNESS:   Maria Hester, a 51 y.o. female, was seen for a [place] cancer genetics consultation at the request of Dr. Jana Hakim due to a personal and family history of cancer.  Maria Hester presents to clinic today to discuss the possibility of a hereditary predisposition to cancer, genetic testing, and to further clarify her future cancer risks, as well as potential cancer risks for family members.   In May 2018, at the age of 29, Maria Hester was diagnosed with invasive ductal carcinoma of the left breast. She is currently receiving neoadjuvant chemotherapy.   HORMONAL RISK FACTORS:  Menarche was at age 68.  First live birth at age 58.  OCP use for approximately 10 years. (implant) Ovaries intact: yes.  Hysterectomy: yes.  Menopausal status: premenopausal.  HRT use: 0 years. Colonoscopy: no; not examined. Mammogram within the last year: yes.   Past Medical History:  Diagnosis Date  . Breast cancer ([place])   . 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 Complication of anesthesia    pt states BP tends to drop and has a hard time waking up - pt was told by prior anesthesiologist to make sure this was made known for furture surgeries  . Family history of breast cancer   . Uterine fibroid     Past Surgical History:  Procedure Laterality Date  . ABDOMINAL HYSTERECTOMY    . ABDOMINOPLASTY N/A 02/28/2014   Procedure: ABDOMINOPLASTY with Panniculectomy;  Surgeon: [name], MD;  Location: [facility];  Service: Gynecology;  Laterality: N/A;  . APPENDECTOMY    . BILATERAL SALPINGECTOMY Bilateral 02/28/2014    Procedure: BILATERAL SALPINGECTOMY;  Surgeon: [name], MD;  Location: [facility];  Service: Gynecology;  Laterality: Bilateral;  . CATARACT EXTRACTION, BILATERAL    . PORTACATH PLACEMENT Right 03/18/2017   Procedure: INSERTION PORT-A-CATH;  Surgeon: Excell Seltzer, MD;  Location: [facility];  Service: General;  Laterality: Right;  . reverse tubal ligation    . SCAR REVISION N/A 02/28/2014   Procedure: SCAR REVISION;  Surgeon: [name], MD;  Location: [facility];  Service: Gynecology;  Laterality: N/A;  . SUPRACERVICAL ABDOMINAL HYSTERECTOMY N/A 02/28/2014   Procedure: HYSTERECTOMY SUPRACERVICAL ABDOMINAL;  Surgeon: [name], MD;  Location: [facility];  Service: Gynecology;  Laterality: N/A;  . TUBAL LIGATION    . WISDOM TOOTH EXTRACTION      Social History   Social History  . Marital status: Married    Spouse name: N/A  . Number of children: N/A  . Years of education: N/A   Social History Main Topics  . Smoking status: Former Smoker    Packs/day: 0.50    Years: 34.00    Types: Cigarettes  . Smokeless tobacco: Never Used  . Alcohol use No     Comment: social drink   . Drug use: No  . Sexual activity: Yes    Birth control/ protection: Surgical     Comment: hyst   Other Topics Concern  . None   Social History Narrative  . None     FAMILY HISTORY:  We obtained a detailed,  4-generation family history.  Significant diagnoses are listed below: Family History  Problem Relation Age of Onset  . Heart disease Mother   . Hypertension Mother   . Diabetes Father   . Heart disease Father   . Heart disease Maternal Grandmother   . Colon cancer Maternal Grandmother 30       died in her 50's  . Heart disease Maternal Grandfather   . Esophageal cancer Maternal Grandfather 50       died at 10  . Diabetes Paternal Grandmother   . Heart disease Paternal Grandmother   . Kidney cancer Paternal Grandmother 44       died in her 17's  . Tuberculosis Paternal Grandfather   .  Heart disease Paternal Grandfather   . Skin cancer Paternal Grandfather 64       died at 11, patient not sure if it was melanoma, BCC, Squamous, etc.  . Breast cancer Maternal Aunt 50       died at 23  . Breast cancer Cousin 64       she is now in her 43's   Maria Hester has a 71 year-old daughter who has a son (57) and daughter (67) who all have no history of cancer.  Maria Hester also has a 7 year-old son who has a 18 year-old son with no history of cancer. Maria Hester has 2 maternal half-sisters and 1 maternal half-brother listed below: -2 year-old maternal half sister who has a 30 year-old daughter.  No history of cancer for these relatives. -49 year-old maternal half-sister who has 2 sons and a daughter.  No history of cancer for these relatives.   The patient's niece and nephew died in childhood due to an accident.  -49 year-old maternal half brother has a daughter who is 18.  No history of cancer for these relatives.   Maria Hester' father died at 89 due to heart disease.  Maria Hester has 1 paternal uncle who died at 39 due to a heart attack.  This uncle had 3 sons who are in their 37's and a daughter who is 100.  No history of cancer for these relatives.  Maria Hester' paternal cousins have several children who are in childhood-20's with no history of cancer.  Maria Hester' paternal grandfather had skin cancer in his 75's.  She is unsure what type this was (melanoma/basal cell/ etc.) He died at the age of 57.  This paternal grandfather had 2 sisters with no known history of cancer.  Maria Hester' paternal grandmother had kidney cancer at the age of 65.  She died in her 3's. She had 13 siblings, but no information is known about their health.   Maria Hester' mother is 21 and has cardiac issues.  She had a hysterectomy/BSO in her 31's.  Maria Hester has a maternal uncle who died in his 26's due to a drowning accident.  She also has 2 maternal half-uncles who are 6 and 60 with no history of  cancer.  These uncles both have children with no history of cancer.  Maria Hester' maternal grandfather died from esophageal cancer at the age of 32.  No information is known about his side of the family.   Maria Hester' maternal grandmother had colon cancer in her 53's and also died in her 90's.  She had a sister (patient's great aunt) who is 57 and has had some type of cancer in her late 26's.  The specific type of cancer is unknown.  Maria Hester is unaware of previous family history of genetic testing for hereditary cancer risks. Patient's maternal ancestors are of Northern European descent, and paternal ancestors are of Northern European descent. There is no reported Ashkenazi Jewish ancestry. There is no known consanguinity.  GENETIC COUNSELING ASSESSMENT: Maria Hester is a 50 y.o. female with a personal and family history which is somewhat suggestive of a Hereditary Cancer Predisposition Syndrome and predisposition to cancer. We, therefore, discussed and recommended the following at today's visit.   DISCUSSION: We reviewed the characteristics, features and inheritance patterns of hereditary cancer syndromes. We also discussed genetic testing, including the appropriate family members to test, the process of testing, insurance coverage and turn-around-time for results. We discussed the implications of a negative, positive and/or variant of uncertain significant result. We recommended Maria Hester pursue genetic testing for the [facility] Common Hereditary Cancer gene panel. The Hereditary Gene Panel offered by [facility] includes sequencing and/or deletion duplication testing of the following 46 genes: APC, ATM, AXIN2, BARD1, BMPR1A, BRCA1, BRCA2, BRIP1, CDH1, CDKN2A (p14ARF), CDKN2A (p16INK4a), CHEK2, CTNNA1, DICER1, EPCAM (Deletion/duplication testing only), GREM1 (promoter region deletion/duplication testing only), KIT, MEN1, MLH1, MSH2, MSH3, MSH6, MUTYH, NBN, NF1, NHTL1, PALB2, PDGFRA, PMS2, POLD1,  POLE, PTEN, RAD50, RAD51C, RAD51D, SDHB, SDHC, SDHD, SMAD4, SMARCA4. STK11, TP53, TSC1, TSC2, and VHL.  The following genes were evaluated for sequence changes only: SDHA and HOXB13 c.251G>A variant only.  We discussed the option to add on kidney cancer risk genes, and the patient decided not to add kidney cancer genes to her genetic testing panel.   We discussed that only 5-10% of cancers are associated with a Hereditary cancer predisposition syndrome.  One of the most common hereditary cancer syndromes that increases breast cancer risk is called Hereditary Breast and Ovarian Cancer (HBOC) syndrome.  This syndrome is caused by mutations in the BRCA1 and BRCA2 genes.  This syndrome increases an individual's lifetime risk to develop breast, ovarian, pancreatic, and other types of cancer.  There are also many other cancer predisposition syndromes caused by mutations in several other genes.  We discussed that if she is found to have a mutation in one of these genes, it may impact surgical decisions, and alter future medical management recommendations such as increased cancer screenings and consideration of risk reducing surgeries.  A positive result could also have implications for the patient's family members.  A Negative result would mean we were unable to identify a hereditary component to her cancer, but does not rule out the possibility of a hereditary basis for her cancer.  There could be mutations that are undetectable by current technology, or in genes not yet tested or identified to increase cancer risk.    We discussed the potential to find a Variant of Uncertain Significance or VUS.  These are variants that have not yet been identified as pathogenic or benign, and it is unknown if this variant is associated with increased cancer risk or if this is a normal finding.  Most VUS's are reclassified to benign or likely benign.   It should not be used to make medical management decisions. With time, we  suspect the lab will determine the significance of any VUS's identified if any.   Based on Ms. Droke's personal and family history of cancer, she meets medical criteria for genetic testing. Despite that she meets criteria, she may still have an out of pocket cost. We discussed that if her out of pocket cost for testing is over $100, the laboratory  will call and confirm whether she wants to proceed with testing.  If the out of pocket cost of testing is less than $100 she will be billed by the genetic testing laboratory.   PLAN: After considering the risks, benefits, and limitations, Ms. Cwynar  provided informed consent to pursue genetic testing and the blood sample was sent to [facility] for analysis of the [facility] Common Hereditary Cancer Panel (46 genes). Results should be available within approximately 2-3 weeks' time, at which point they will be disclosed by telephone to Ms. Goggins, as will any additional recommendations warranted by these results. Ms. Blickenstaff will receive a summary of her genetic counseling visit and a copy of her results once available. This information will also be available in Epic. We encouraged Ms. Tompson to remain in contact with cancer genetics annually so that we can continuously update the family history and inform her of any changes in cancer genetics and testing that may be of benefit for her family. Ms. Blecher questions were answered to her satisfaction today. Our contact information was provided should additional questions or concerns arise.  Based on Ms. Traughber's family history, we also recommended her cousin, who was diagnosed with breast cancer in her 62's, have genetic counseling and testing. Ms. Sedlak will let us know if we can be of any assistance in coordinating genetic counseling and/or testing for this family member.   Lastly, we encouraged Ms. Gaede to remain in contact with cancer genetics annually so that we can continuously  update the family history and inform her of any changes in cancer genetics and testing that may be of benefit for this family.   Ms.  Nader questions were answered to her satisfaction today. Our contact information was provided should additional questions or concerns arise. Thank you for the referral and allowing [place] to share in the care of your patient.   Tana Felts, MS Genetic Counselor [email or web address] phone: [phone]  The patient was seen for a total of 40 minutes in face-to-face genetic counseling.  This patient was discussed with Drs. Magrinat, Lindi Adie and/or Burr Medico who agrees with the above. She was accompanied today by her husband and sister.

## 2017-05-06 ENCOUNTER — Telehealth: Payer: Self-pay | Admitting: Genetics

## 2017-05-10 ENCOUNTER — Other Ambulatory Visit: Payer: Self-pay | Admitting: Oncology

## 2017-05-10 DIAGNOSIS — C50812 Malignant neoplasm of overlapping sites of left female breast: Secondary | ICD-10-CM

## 2017-05-10 DIAGNOSIS — Z171 Estrogen receptor negative status [ER-]: Principal | ICD-10-CM

## 2017-05-11 ENCOUNTER — Other Ambulatory Visit: Payer: Self-pay | Admitting: Emergency Medicine

## 2017-05-11 DIAGNOSIS — C50812 Malignant neoplasm of overlapping sites of left female breast: Secondary | ICD-10-CM

## 2017-05-11 DIAGNOSIS — Z171 Estrogen receptor negative status [ER-]: Principal | ICD-10-CM

## 2017-05-11 NOTE — Progress Notes (Signed)
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  She is doing very well today.  She is accompanied by her friend and her husband today.  She tells me that she is due to restart Pertuzumab today per her conversation with Dr. Jana Hakim at her last appointment.  She has [place] and Imodium on hand.  She is motivated to restart the Pertuzumab.    She has an occasional bump she will get that will itch and she will scratch it and it will scab over. She says it occurs on her chest area and arms, and she has had about 4 of these bumps pop up recently.      REVIEW OF SYSTEMS: Maria Hester says she can no longer feel the lump in her breasts.  Otherwise a detailed ROS is non contributory and particularly she denies peripheral neuropathy.    BREAST CANCER HISTORY: From the original intake note:  Maria Hester herself noted a change in her left breast when looking into a mirror and then palpating a mass in the upper-outer quadrant. She brought this to medical attention and on 02/24/2017 underwent bilateral diagnostic mammography with tomography and left breast ultrasonography at the breast Center. The breast density was category C. In the upper portion of the left breast there was a broad area of distortion measuring up to 7.5 cm.  On exam there was a broad firm area involving the upper outer quadrant of the left breast which was visibly protruding. By ultrasound at the 11:00 axis 8 cm from the nipple there was an irregular hypoechoic mass measuring 1.4 cm with additiona ases at 2:00, 3 cm from the nipple and multiple other masses as described, all in the upper-outer quadrant primarily. Ultrasound of the left axilla showed at least 3 prominent lymph nodes one of which had a cortical bulge.  On 03/02/2017 the patient underwent biopsy of a left breast mass at the 11:30 o'clock position and a second mass described as upper outer quadrant as well as one of the suspicious left axillary lymph nodes. These all showed invasive ductal carcinoma, grade 3. Prognostic panel from one of the 2 masses showed the tumor to be estrogen and progesterone receptor negative, but HER-2 amplified, with a signals ratio of 8.24 and the number per cell 15.65.  Her subsequent history is as detailed below   PAST MEDICAL HISTORY: Past Medical History:  Diagnosis Date  . Breast cancer ([place])   . 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 Complication of anesthesia    pt states BP tends to drop and has a hard time waking up - pt was told by prior anesthesiologist to make sure this was made known for furture surgeries  . Family history of breast cancer   . Uterine fibroid     PAST SURGICAL HISTORY: Past Surgical History:  Procedure Laterality Date  . ABDOMINAL  HYSTERECTOMY    . ABDOMINOPLASTY N/A 02/28/2014   Procedure: ABDOMINOPLASTY with Panniculectomy;  Surgeon: Maria Kind, MD;  Location: [facility];  Service: Gynecology;  Laterality: N/A;  . APPENDECTOMY    . BILATERAL SALPINGECTOMY Bilateral 02/28/2014   Procedure: BILATERAL SALPINGECTOMY;  Surgeon: Maria Kind, MD;  Location: [facility];  Service: Gynecology;  Laterality: Bilateral;  . CATARACT EXTRACTION, BILATERAL    . PORTACATH PLACEMENT  Right 03/18/2017   Procedure: INSERTION PORT-A-CATH;  Surgeon: Excell Seltzer, MD;  Location: [facility];  Service: General;  Laterality: Right;  . reverse tubal ligation    . SCAR REVISION N/A 02/28/2014   Procedure: SCAR REVISION;  Surgeon: Maria Kind, MD;  Location: [facility];  Service: Gynecology;  Laterality: N/A;  . SUPRACERVICAL ABDOMINAL HYSTERECTOMY N/A 02/28/2014   Procedure: HYSTERECTOMY SUPRACERVICAL ABDOMINAL;  Surgeon: Maria Kind, MD;  Location: [facility];  Service: Gynecology;  Laterality: N/A;  . TUBAL LIGATION    . WISDOM TOOTH EXTRACTION      FAMILY HISTORY Family History  Problem Relation Age of Onset  . Heart disease Mother   . Hypertension Mother   . Diabetes Father   . Heart disease Father   . Heart disease Maternal Grandmother   . Colon cancer Maternal Grandmother 32       died in her 76's  . Heart disease Maternal Grandfather   . Esophageal cancer Maternal Grandfather 50       died at 24  . Diabetes Paternal Grandmother   . Heart disease Paternal Grandmother   . Kidney cancer Paternal Grandmother 22       died in her 39's  . Tuberculosis Paternal Grandfather   . Heart disease Paternal Grandfather   . Skin cancer Paternal Grandfather 99       died at 61, patient not sure if it was melanoma, BCC, Squamous, etc.  . Breast cancer Maternal Aunt 50       died at 29  . Breast cancer Cousin 89       she is now in her 31's  The patient's father died from heart disease at age 108. The patient's mother is living at age 55. The patient has one brother, 2 sisters. On the mother's side and aunt had breast cancer at age 54 and her daughter had breast cancer in her early 9s. There is also a grandfather with esophageal cancer and a grandmother with colon cancer. On the paternal side there is a grandmother with kidney cancer at an early age and a grandfather with melanoma at an early age.  GYNECOLOGIC HISTORY:  Patient's last menstrual period was 02/11/2014.  Menarche  age 89, first live birth age 57, the patient is [place] P2. She underwent hysterectomy without salpingo-oophorectomy May 2016. She did not use hormone replacement. She did use oral contraceptives approximately 10 years in the 1980s.  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 Her husband Maria Hester in the same [place], which is a 10% aviation enrollment. Daughter Maria Hester is a hairstylist in [place] and son Maria Hester is a Physiological scientist in [place] the patient has 2 grandsons and one granddaughter. She attends a [employer]    ADVANCED DIRECTIVES: The patient has completed advanced directives and will have the motorized at the next visit   HEALTH MAINTENANCE: Social History  Substance Use Topics  . Smoking status: Former Smoker    Packs/day: 0.50    Years:  34.00    Types: Cigarettes  . Smokeless tobacco: Never Used  . Alcohol use No     Comment: social drink      Colonoscopy:No  PAP: 03/04/2017  Bone density: No   No Known Allergies  Current Outpatient Prescriptions  Medication Sig Dispense Refill  . ALPRAZolam (XANAX) 0.5 MG tablet TAKE HALF TO 1 TABLET BY MOUTH EVERY 8 HOURS AS NEEDED FOR ANXIETY  2  . cholestyramine (QUESTRAN) 4 GM/DOSE powder Take 1 packet (4 g total) by mouth 2 (two) times daily. 378 g 12  . dexamethasone (DECADRON) 4 MG tablet Take 2 tablets (8 mg total) by mouth 2 (two) times daily. Start the day before Taxotere. Then again the day after chemo for 3 days. 30 tablet 1  . lidocaine-prilocaine (EMLA) cream Apply to affected area once 30 g 3  . LORazepam (ATIVAN) 0.5 MG tablet TAKE 1 TABLET BY MOUTH AT BEDTIME AS NEEDED FOR NAUSEA AND VOMITING 30 tablet 0  . metroNIDAZOLE (METROGEL) 1 % gel Apply topically daily. 45 g 0  . ondansetron (ZOFRAN) 8 MG tablet Take 1 tablet (8 mg total) by mouth every 8 (eight) hours as needed for nausea or vomiting. 30 tablet 3  . prochlorperazine  (COMPAZINE) 10 MG tablet Take 1 tablet (10 mg total) by mouth every 6 (six) hours as needed (Nausea or vomiting). 30 tablet 1   No current facility-administered medications for this visit.     OBJECTIVE:  Vitals:   05/12/17 0928  BP: (!) 104/54  Pulse: 60  Resp: 18  Temp: 98.1 F (36.7 C)     Body mass index is 31.37 kg/m.    ECOG FS:1 - Symptomatic but completely ambulatory  GENERAL: Patient is a well appearing female in no acute distress HEENT:  Sclerae anicteric.  Oropharynx clear and moist. No ulcerations or evidence of oropharyngeal candidiasis. Neck is supple.  NODES:  No cervical, supraclavicular, or axillary lymphadenopathy palpated.  BREAST EXAM:  Deferred. LUNGS:  Clear to auscultation bilaterally.  No wheezes or rhonchi. HEART:  Regular rate and rhythm. No murmur appreciated. ABDOMEN:  Soft, nontender.  Positive, normoactive bowel sounds. No organomegaly palpated. MSK:  No focal spinal tenderness to palpation. Full range of motion bilaterally in the upper extremities. EXTREMITIES:  No peripheral edema.   SKIN:  Clear with no obvious rashes or skin changes. No nail dyscrasia. NEURO:  Nonfocal. Well oriented.  Appropriate affect.    LAB RESULTS:  CMP     Component Value Date/Time   NA 139 05/12/2017 0853   K 4.2 05/12/2017 0853   CL 106 03/01/2014 0607   CO2 22 05/12/2017 0853   GLUCOSE 127 05/12/2017 0853   BUN 16.0 05/12/2017 0853   CREATININE 0.7 05/12/2017 0853   CALCIUM 9.4 05/12/2017 0853   PROT 6.8 05/12/2017 0853   ALBUMIN 3.4 (L) 05/12/2017 0853   AST 16 05/12/2017 0853   ALT 27 05/12/2017 0853   ALKPHOS 89 05/12/2017 0853   BILITOT 0.22 05/12/2017 0853   GFRNONAA >90 03/01/2014 0607   GFRAA >90 03/01/2014 0607    No results found for: TOTALPROTELP, ALBUMINELP, A1GS, A2GS, BETS, BETA2SER, GAMS, MSPIKE, SPEI  No results found for: KPAFRELGTCHN, LAMBDASER, KAPLAMBRATIO  Lab Results  Component Value Date   WBC 15.5 (H) 05/12/2017   NEUTROABS  12.2 (H) 05/12/2017   HGB 10.8 (L) 05/12/2017   HCT 33.1 (L) 05/12/2017   MCV 90.2 05/12/2017   PLT 282 05/12/2017      Chemistry  Component Value Date/Time   NA 139 05/12/2017 0853   K 4.2 05/12/2017 0853   CL 106 03/01/2014 0607   CO2 22 05/12/2017 0853   BUN 16.0 05/12/2017 0853   CREATININE 0.7 05/12/2017 0853      Component Value Date/Time   CALCIUM 9.4 05/12/2017 0853   ALKPHOS 89 05/12/2017 0853   AST 16 05/12/2017 0853   ALT 27 05/12/2017 0853   BILITOT 0.22 05/12/2017 0853       No results found for: LABCA2  No components found for: [id number]  No results for input(s): INR in the last 168 hours.  Urinalysis    Component Value Date/Time   COLORURINE YELLOW 02/23/2014 1004   APPEARANCEUR CLEAR 02/23/2014 1004   LABSPEC 1.025 02/23/2014 1004   PHURINE 5.5 02/23/2014 1004   GLUCOSEU NEGATIVE 02/23/2014 1004   HGBUR NEGATIVE 02/23/2014 1004   BILIRUBINUR NEGATIVE 02/23/2014 1004   KETONESUR NEGATIVE 02/23/2014 1004   PROTEINUR NEGATIVE 02/23/2014 1004   UROBILINOGEN 0.2 02/23/2014 1004   NITRITE NEGATIVE 02/23/2014 1004   LEUKOCYTESUR NEGATIVE 02/23/2014 1004     STUDIES: Bone scan and CT of the chest from 03/20/2017 briefly reviewed with patient  ELIGIBLE FOR AVAILABLE RESEARCH PROTOCOL: [place] study  ASSESSMENT: 51 y.o. [place], [place] woman status post left breast overlapping sites biopsy 03/02/2017 for a clinical T3 N1-2, stage IIIA invasive ductal carcinoma, grade 3, estrogen and progesterone receptor negative, HER-2 amplified, with an MIB-1 of 35%  (1) genetics testing 04/06/2017  (2) neoadjuvant chemotherapy will consist of carboplatin and Docetaxel given with trastuzumab and Pertuzumab every 21 days for 6 cycles, starting 03/31/2017  (a) Pertuzumab stopped after cycle 1 because of severe diarrhea complications  (3) trastuzumab will be continued to complete one year  (a) echocardiogram 03/20/2017 showed an ejection fraction of 55%   (4)  definitive surgery to follow chemotherapy  (5) adjuvant radiation to follow surgery   PLAN: Kaysi is doing well today.  She will proceed with chemotherapy today.  We reviewed how to take the questran and imodium this week to prevent extensive diarrhea.  She is otherwise tolerating chemotherapy well.  She will return in a week for labs and evaluation.    She has a good understanding of the overall plan. She knows to call for any other problems that may develop before her next visit.  A total of (20) minutes of face-to-face time was spent with this patient with greater than 50% of that time in counseling and care-coordination.   Gardenia Phlegm, NP 04/22/2017 5:56 PM Medical Oncology and Hematology [facility] [address] [place], [place] [address] Tel. [phone]    Fax. [phone]

## 2017-05-12 ENCOUNTER — Ambulatory Visit: Payer: 59

## 2017-05-12 ENCOUNTER — Other Ambulatory Visit: Payer: Self-pay | Admitting: Hematology and Oncology

## 2017-05-12 ENCOUNTER — Other Ambulatory Visit (HOSPITAL_BASED_OUTPATIENT_CLINIC_OR_DEPARTMENT_OTHER): Payer: 59

## 2017-05-12 ENCOUNTER — Encounter: Payer: Self-pay | Admitting: Adult Health

## 2017-05-12 ENCOUNTER — Ambulatory Visit: Payer: Self-pay | Admitting: Genetics

## 2017-05-12 ENCOUNTER — Encounter: Payer: Self-pay | Admitting: Genetics

## 2017-05-12 ENCOUNTER — Ambulatory Visit (HOSPITAL_BASED_OUTPATIENT_CLINIC_OR_DEPARTMENT_OTHER): Payer: 59 | Admitting: Adult Health

## 2017-05-12 ENCOUNTER — Ambulatory Visit (HOSPITAL_BASED_OUTPATIENT_CLINIC_OR_DEPARTMENT_OTHER): Payer: 59

## 2017-05-12 VITALS — BP 104/54 | HR 60 | Temp 98.1°F | Resp 18 | Ht 65.5 in | Wt 191.4 lb

## 2017-05-12 DIAGNOSIS — C50812 Malignant neoplasm of overlapping sites of left female breast: Secondary | ICD-10-CM | POA: Diagnosis not present

## 2017-05-12 DIAGNOSIS — Z171 Estrogen receptor negative status [ER-]: Secondary | ICD-10-CM

## 2017-05-12 DIAGNOSIS — Z5112 Encounter for antineoplastic immunotherapy: Secondary | ICD-10-CM

## 2017-05-12 DIAGNOSIS — C50412 Malignant neoplasm of upper-outer quadrant of left female breast: Secondary | ICD-10-CM

## 2017-05-12 DIAGNOSIS — Z5189 Encounter for other specified aftercare: Secondary | ICD-10-CM

## 2017-05-12 DIAGNOSIS — Z803 Family history of malignant neoplasm of breast: Secondary | ICD-10-CM

## 2017-05-12 DIAGNOSIS — Z95828 Presence of other vascular implants and grafts: Secondary | ICD-10-CM

## 2017-05-12 DIAGNOSIS — Z1379 Encounter for other screening for genetic and chromosomal anomalies: Secondary | ICD-10-CM

## 2017-05-12 DIAGNOSIS — Z5111 Encounter for antineoplastic chemotherapy: Secondary | ICD-10-CM | POA: Diagnosis not present

## 2017-05-12 LAB — CBC WITH DIFFERENTIAL/PLATELET
BASO%: 0.1 % (ref 0.0–2.0)
BASOS ABS: 0 10*3/uL (ref 0.0–0.1)
EOS%: 0 % (ref 0.0–7.0)
Eosinophils Absolute: 0 10*3/uL (ref 0.0–0.5)
HCT: 33.1 % — ABNORMAL LOW (ref 34.8–46.6)
HGB: 10.8 g/dL — ABNORMAL LOW (ref 11.6–15.9)
LYMPH%: 15.6 % (ref 14.0–49.7)
MCH: 29.4 pg (ref 25.1–34.0)
MCHC: 32.6 g/dL (ref 31.5–36.0)
MCV: 90.2 fL (ref 79.5–101.0)
MONO#: 0.9 10*3/uL (ref 0.1–0.9)
MONO%: 5.8 % (ref 0.0–14.0)
NEUT#: 12.2 10*3/uL — ABNORMAL HIGH (ref 1.5–6.5)
NEUT%: 78.5 % — AB (ref 38.4–76.8)
Platelets: 282 10*3/uL (ref 145–400)
RBC: 3.67 10*6/uL — AB (ref 3.70–5.45)
RDW: 15.9 % — ABNORMAL HIGH (ref 11.2–14.5)
WBC: 15.5 10*3/uL — ABNORMAL HIGH (ref 3.9–10.3)
lymph#: 2.4 10*3/uL (ref 0.9–3.3)

## 2017-05-12 LAB — COMPREHENSIVE METABOLIC PANEL
ALT: 27 U/L (ref 0–55)
AST: 16 U/L (ref 5–34)
Albumin: 3.4 g/dL — ABNORMAL LOW (ref 3.5–5.0)
Alkaline Phosphatase: 89 U/L (ref 40–150)
Anion Gap: 9 mEq/L (ref 3–11)
BUN: 16 mg/dL (ref 7.0–26.0)
CALCIUM: 9.4 mg/dL (ref 8.4–10.4)
CHLORIDE: 107 meq/L (ref 98–109)
CO2: 22 mEq/L (ref 22–29)
Creatinine: 0.7 mg/dL (ref 0.6–1.1)
EGFR: >90 mL/min/{1.73_m2} (ref >90)
GLUCOSE: 127 mg/dL (ref 70–140)
POTASSIUM: 4.2 meq/L (ref 3.5–5.1)
SODIUM: 139 meq/L (ref 136–145)
Total Bilirubin: 0.22 mg/dL (ref 0.20–1.20)
Total Protein: 6.8 g/dL (ref 6.4–8.3)

## 2017-05-12 MED ORDER — SODIUM CHLORIDE 0.9% FLUSH
10.0000 mL | Freq: Once | INTRAVENOUS | Status: AC
  Administered 2017-05-12: 10 mL
  Filled 2017-05-12: qty 10

## 2017-05-12 MED ORDER — SODIUM CHLORIDE 0.9 % IV SOLN
Freq: Once | INTRAVENOUS | Status: AC
  Administered 2017-05-12: 11:00:00 via INTRAVENOUS

## 2017-05-12 MED ORDER — ACETAMINOPHEN 325 MG PO TABS
ORAL_TABLET | ORAL | Status: AC
  Filled 2017-05-12: qty 2

## 2017-05-12 MED ORDER — PEGFILGRASTIM 6 MG/0.6ML ~~LOC~~ PSKT
6.0000 mg | PREFILLED_SYRINGE | Freq: Once | SUBCUTANEOUS | Status: AC
  Administered 2017-05-12: 6 mg via SUBCUTANEOUS
  Filled 2017-05-12: qty 0.6

## 2017-05-12 MED ORDER — SODIUM CHLORIDE 0.9 % IV SOLN
702.5000 mg | Freq: Once | INTRAVENOUS | Status: AC
  Administered 2017-05-12: 700 mg via INTRAVENOUS
  Filled 2017-05-12: qty 70

## 2017-05-12 MED ORDER — PALONOSETRON HCL INJECTION 0.25 MG/5ML
0.2500 mg | Freq: Once | INTRAVENOUS | Status: AC
  Administered 2017-05-12: 0.25 mg via INTRAVENOUS

## 2017-05-12 MED ORDER — SODIUM CHLORIDE 0.9 % IV SOLN
420.0000 mg | Freq: Once | INTRAVENOUS | Status: AC
  Administered 2017-05-12: 420 mg via INTRAVENOUS
  Filled 2017-05-12: qty 14

## 2017-05-12 MED ORDER — HEPARIN SOD (PORK) LOCK FLUSH 100 UNIT/ML IV SOLN
500.0000 [IU] | Freq: Once | INTRAVENOUS | Status: AC | PRN
  Administered 2017-05-12: 500 [IU]
  Filled 2017-05-12: qty 5

## 2017-05-12 MED ORDER — DOCETAXEL CHEMO INJECTION 160 MG/16ML
75.0000 mg/m2 | Freq: Once | INTRAVENOUS | Status: AC
  Administered 2017-05-12: 150 mg via INTRAVENOUS
  Filled 2017-05-12: qty 15

## 2017-05-12 MED ORDER — ACETAMINOPHEN 325 MG PO TABS
650.0000 mg | ORAL_TABLET | Freq: Once | ORAL | Status: AC
  Administered 2017-05-12: 650 mg via ORAL

## 2017-05-12 MED ORDER — DIPHENHYDRAMINE HCL 25 MG PO CAPS
ORAL_CAPSULE | ORAL | Status: AC
  Filled 2017-05-12: qty 2

## 2017-05-12 MED ORDER — DEXAMETHASONE SODIUM PHOSPHATE 10 MG/ML IJ SOLN
10.0000 mg | Freq: Once | INTRAMUSCULAR | Status: AC
  Administered 2017-05-12: 10 mg via INTRAVENOUS

## 2017-05-12 MED ORDER — SODIUM CHLORIDE 0.9% FLUSH
10.0000 mL | INTRAVENOUS | Status: DC | PRN
Start: 2017-05-12 — End: 2017-05-12
  Administered 2017-05-12: 10 mL
  Filled 2017-05-12: qty 10

## 2017-05-12 MED ORDER — TRASTUZUMAB CHEMO 150 MG IV SOLR
6.0000 mg/kg | Freq: Once | INTRAVENOUS | Status: AC
  Administered 2017-05-12: 525 mg via INTRAVENOUS
  Filled 2017-05-12: qty 25

## 2017-05-12 MED ORDER — DIPHENHYDRAMINE HCL 25 MG PO CAPS
25.0000 mg | ORAL_CAPSULE | Freq: Once | ORAL | Status: AC
  Administered 2017-05-12: 25 mg via ORAL

## 2017-05-12 MED ORDER — DEXAMETHASONE SODIUM PHOSPHATE 10 MG/ML IJ SOLN
INTRAMUSCULAR | Status: AC
  Filled 2017-05-12: qty 1

## 2017-05-12 MED ORDER — PALONOSETRON HCL INJECTION 0.25 MG/5ML
INTRAVENOUS | Status: AC
  Filled 2017-05-12: qty 5

## 2017-05-12 NOTE — Patient Instructions (Signed)
Implanted Port Home Guide An implanted port is a type of central line that is placed under the skin. Central lines are used to provide IV access when treatment or nutrition needs to be given through a person's veins. Implanted ports are used for long-term IV access. An implanted port may be placed because:  You need IV medicine that would be irritating to the small veins in your hands or arms.  You need long-term IV medicines, such as antibiotics.  You need IV nutrition for a long period.  You need frequent blood draws for lab tests.  You need dialysis.  Implanted ports are usually placed in the chest area, but they can also be placed in the upper arm, the abdomen, or the leg. An implanted port has two main parts:  Reservoir. The reservoir is round and will appear as a small, raised area under your skin. The reservoir is the part where a needle is inserted to give medicines or draw blood.  Catheter. The catheter is a thin, flexible tube that extends from the reservoir. The catheter is placed into a large vein. Medicine that is inserted into the reservoir goes into the catheter and then into the vein.  How will I care for my incision site? Do not get the incision site wet. Bathe or shower as directed by your health care provider. How is my port accessed? Special steps must be taken to access the port:  Before the port is accessed, a numbing cream can be placed on the skin. This helps numb the skin over the port site.  Your health care provider uses a sterile technique to access the port. ? Your health care provider must put on a mask and sterile gloves. ? The skin over your port is cleaned carefully with an antiseptic and allowed to dry. ? The port is gently pinched between sterile gloves, and a needle is inserted into the port.  Only "non-coring" port needles should be used to access the port. Once the port is accessed, a blood return should be checked. This helps ensure that the port  is in the vein and is not clogged.  If your port needs to remain accessed for a constant infusion, a clear (transparent) bandage will be placed over the needle site. The bandage and needle will need to be changed every week, or as directed by your health care provider.  Keep the bandage covering the needle clean and dry. Do not get it wet. Follow your health care provider's instructions on how to take a shower or bath while the port is accessed.  If your port does not need to stay accessed, no bandage is needed over the port.  What is flushing? Flushing helps keep the port from getting clogged. Follow your health care provider's instructions on how and when to flush the port. Ports are usually flushed with saline solution or a medicine called heparin. The need for flushing will depend on how the port is used.  If the port is used for intermittent medicines or blood draws, the port will need to be flushed: ? After medicines have been given. ? After blood has been drawn. ? As part of routine maintenance.  If a constant infusion is running, the port may not need to be flushed.  How long will my port stay implanted? The port can stay in for as long as your health care provider thinks it is needed. When it is time for the port to come out, surgery will be   done to remove it. The procedure is similar to the one performed when the port was put in. When should I seek immediate medical care? When you have an implanted port, you should seek immediate medical care if:  You notice a bad smell coming from the incision site.  You have swelling, redness, or drainage at the incision site.  You have more swelling or pain at the port site or the surrounding area.  You have a fever that is not controlled with medicine.  This information is not intended to replace advice given to you by your health care provider. Make sure you discuss any questions you have with your health care provider. Document  Released: 09/29/2005 Document Revised: 03/06/2016 Document Reviewed: 06/06/2013 Elsevier Interactive Patient Education  2017 Elsevier Inc.  

## 2017-05-12 NOTE — Patient Instructions (Signed)
[facility] Discharge Instructions for Patients Receiving Chemotherapy  Today you received the following chemotherapy agents Taxotere/Carboplatin/Herceptin/Perjeta  To help prevent nausea and vomiting after your treatment, we encourage you to take your nausea medication    If you develop nausea and vomiting that is not controlled by your nausea medication, call the clinic.   BELOW ARE SYMPTOMS THAT SHOULD BE REPORTED IMMEDIATELY:  *FEVER GREATER THAN 100.5 F  *CHILLS WITH OR WITHOUT FEVER  NAUSEA AND VOMITING THAT IS NOT CONTROLLED WITH YOUR NAUSEA MEDICATION  *UNUSUAL SHORTNESS OF BREATH  *UNUSUAL BRUISING OR BLEEDING  TENDERNESS IN MOUTH AND THROAT WITH OR WITHOUT PRESENCE OF ULCERS  *URINARY PROBLEMS  *BOWEL PROBLEMS  UNUSUAL RASH Items with * indicate a potential emergency and should be followed up as soon as possible.  Feel free to call the clinic you have any questions or concerns. The clinic phone number is [phone].  Please show the [facility] at check-in to the Emergency Department and triage nurse.

## 2017-05-12 NOTE — Progress Notes (Signed)
HPI: Ms. Kops was previously seen in the [facility] on 04/28/2017 due to a personal and family history of breast cancer and concerns regarding a hereditary predisposition to cancer. Please refer to our prior cancer genetics clinic note for more information regarding Ms. Holton's medical, social and family histories, and our assessment and recommendations, at the time. Ms. Flanagin recent genetic test results were disclosed to her, as well as recommendations warranted by these results. These results and recommendations are discussed in more detail below.  CANCER HISTORY:    Malignant neoplasm of overlapping sites of left breast in female, estrogen receptor negative ([place])   03/05/2017 Initial Diagnosis    Malignant neoplasm of overlapping sites of left breast in female, estrogen receptor negative ([place])     05/05/2017 Genetic Testing    Patient had genetic testing due to a personal and family history of breast cancer.  She was tested for the [facility] Common Hereditary Cancers Panel. The Hereditary Gene Panel offered by [facility] includes sequencing and/or deletion duplication testing of the following 46 genes: APC, ATM, AXIN2, BARD1, BMPR1A, BRCA1, BRCA2, BRIP1, CDH1, CDKN2A (p14ARF), CDKN2A (p16INK4a), CHEK2, CTNNA1, DICER1, EPCAM (Deletion/duplication testing only), GREM1 (promoter region deletion/duplication testing only), KIT, MEN1, MLH1, MSH2, MSH3, MSH6, MUTYH, NBN, NF1, NHTL1, PALB2, PDGFRA, PMS2, POLD1, POLE, PTEN, RAD50, RAD51C, RAD51D, SDHB, SDHC, SDHD, SMAD4, SMARCA4. STK11, TP53, TSC1, TSC2, and VHL.  The following genes were evaluated for sequence changes only: SDHA and HOXB13 c.251G>A variant only.  Results: No pathogenic mutations identified in the 46 genes tested.  The date of this test report is 05/05/2017.         FAMILY HISTORY:  We obtained a detailed, 4-generation family history.  Significant diagnoses are listed below: Family History  Problem Relation  Age of Onset  . Heart disease Mother   . Hypertension Mother   . Diabetes Father   . Heart disease Father   . Heart disease Maternal Grandmother   . Colon cancer Maternal Grandmother 64       died in her 45's  . Heart disease Maternal Grandfather   . Esophageal cancer Maternal Grandfather 50       died at 67  . Diabetes Paternal Grandmother   . Heart disease Paternal Grandmother   . Kidney cancer Paternal Grandmother 60       died in her 39's  . Tuberculosis Paternal Grandfather   . Heart disease Paternal Grandfather   . Skin cancer Paternal Grandfather 68       died at 80, patient not sure if it was melanoma, BCC, Squamous, etc.  . Breast cancer Maternal Aunt 50       died at 26  . Breast cancer Cousin 82       she is now in her 69's   Ms. Lemus has a 37 year-old daughter who has a son (49) and daughter (38) who all have no history of cancer.  Ms. Evon also has a 58 year-old son who has a 57 year-old son with no history of cancer. Ms. Mccamy has 2 maternal half-sisters and 1 maternal half-brother listed below: -61 year-old maternal half sister who has a 49 year-old daughter.  No history of cancer for these relatives. -49 year-old maternal half-sister who has 2 sons and a daughter.  No history of cancer for these relatives.   The patient's niece and nephew died in childhood due to an accident.  -48 year-old maternal half brother has a daughter who is  32.  No history of cancer for these relatives.   Ms. Lemmons' father died at 51 due to heart disease.  Ms. Sakurai has 1 paternal uncle who died at 71 due to a heart attack.  This uncle had 3 sons who are in their 77's and a daughter who is 42.  No history of cancer for these relatives.  Ms. Coffel' paternal cousins have several children who are in childhood-20's with no history of cancer.  Ms. Batley' paternal grandfather had skin cancer in his 15's.  She is unsure what type this was (melanoma/basal cell/ etc.) He died at the  age of 63.  This paternal grandfather had 2 sisters with no known history of cancer.  Ms. Eber' paternal grandmother had kidney cancer at the age of 84.  She died in her 63's. She had 13 siblings, but no information is known about their health.   Ms. Brogden' mother is 6 and has cardiac issues.  She had a hysterectomy/BSO in her 22's.  Ms. Castner has a maternal uncle who died in his 24's due to a drowning accident.  She also has 2 maternal half-uncles who are 11 and 60 with no history of cancer.  These uncles both have children with no history of cancer.  Ms. Henson' maternal grandfather died from esophageal cancer at the age of 71.  No information is known about his side of the family.   Ms. Klugh' maternal grandmother had colon cancer in her 1's and also died in her 43's.  She had a sister (patient's great aunt) who is 87 and has had some type of cancer in her late 29's.  The specific type of cancer is unknown.   Ms. Corp is unaware of previous family history of genetic testing for hereditary cancer risks. Patient's maternal ancestors are of [place] descent, and paternal ancestors are of [place] descent. There is no reported Ashkenazi Jewish ancestry. There is no known consanguinity.  Ms. Bright informed me at the time of her result disclosure that her maternal cousin who had breast cancer in her 36's had genetic testing that was negative.  This indicates that the cause for the breast cancer in her family is unknown and has not been detected with genetic testing at this time.   GENETIC TEST RESULTS: Genetic testing performed through [facility]'s Common Hereditary Cancer Panel reported out on 05/05/2017 showed no pathogenic mutations.  The Hereditary Gene Panel offered by [facility] includes sequencing and/or deletion duplication testing of the following 46 genes: APC, ATM, AXIN2, BARD1, BMPR1A, BRCA1, BRCA2, BRIP1, CDH1, CDKN2A (p14ARF), CDKN2A (p16INK4a), CHEK2,  CTNNA1, DICER1, EPCAM (Deletion/duplication testing only), GREM1 (promoter region deletion/duplication testing only), KIT, MEN1, MLH1, MSH2, MSH3, MSH6, MUTYH, NBN, NF1, NHTL1, PALB2, PDGFRA, PMS2, POLD1, POLE, PTEN, RAD50, RAD51C, RAD51D, SDHB, SDHC, SDHD, SMAD4, SMARCA4. STK11, TP53, TSC1, TSC2, and VHL.  The following genes were evaluated for sequence changes only: SDHA and HOXB13 c.251G>A variant only.  The test report will be scanned into EP[employer] and will be located under the Molecular Pathology section of the Results Review tab.A portion of the result report is included below for reference.      We discussed with Ms. Schneck that because current genetic testing is not perfect, it is possible there may be a gene mutation in one of these genes that current testing cannot detect, but that chance is small. We also discussed, that there could be another gene that has not yet been discovered, or that we have not yet  tested, that is responsible for the cancer diagnoses in the family. Therefore, it is important to remain in touch with cancer genetics in the future so that we can continue to offer Ms. Sprung the most up to date genetic testing.   ADDITIONAL GENETIC TESTING: We discussed with Ms. Ostermann that there are other genes that are associated with increased cancer risk that can be analyzed. The laboratories that offer this testing look at these additional genes via a hereditary cancer gene panel. Should Ms. Sanpedro wish to pursue additional genetic testing, we are happy to discuss and coordinate this testing, at any time.     CANCER SCREENING RECOMMENDATIONS:  Based on these negative genetic test results, there areno additional cancer risks we are aware of for Ms. Shorts, and no additional screening or medical management that we would recommend for her at this time based on genetic testing.    This result indicates that it is unlikely Ms. Malkiewicz has an increased risk for a future cancer  due to a mutation in one of these genes. This normal test also suggests that Ms. Leverich's cancer was likely not due to an inherited predisposition associated with one of these genes.  Most cancers happen by chance and this negative test suggests that her cancer may fall into this category.  Therefore, it is recommended she continue to follow the cancer management and screening guidelines provided by her oncology and primary healthcare provider. Other factors such as her personal and family history may still affect her cancer risk.  RECOMMENDATIONS FOR FAMILY MEMBERS: Women in this family might be at some increased risk of developing cancer, over the general population risk, simply due to the family history of cancer. We recommended women in this family have a yearly mammogram beginning at age 57, or 34 years younger than the earliest onset of cancer, an annual clinical breast exam, and perform monthly breast self-exams. For Ms. Lana' daughter 10 years earlier than her mother's diagnosis would indicate starting mammogram screening at 41.  Women in this family should also have a gynecological exam as recommended by their primary provider. All family members should have a colonoscopy by age 72.  FOLLOW-UP: Lastly, we discussed with Ms. Szymanowski that cancer genetics is a rapidly advancing field and it is possible that new genetic tests will be appropriate for her and/or her family members in the future. We encouraged her to remain in contact with cancer genetics on an annual basis so we can update her personal and family histories and let her know of advances in cancer genetics that may benefit this family.   Our contact number was provided. Ms. Loncar questions were answered to her satisfaction, and she knows she is welcome to call us at anytime with additional questions or concerns.   Ferol Luz, MS Genetic Counselor [email or web address]

## 2017-05-12 NOTE — Telephone Encounter (Signed)
Revealed negative genetic testing.  Patient said she was upstairs in infusion room, and agreed to discuss results and receive them in person.   We discussed that we do not know why she has breast cancer or why there is cancer in the family. It could be due to a different gene that we are not testing, or maybe our current technology may not be able to pick something up.  Recommended her daughter start mammograms at 35, and that relatives could still have an increased risk for cancer.  Ms. Guedea informed me her cousin with breast cancer had genetic testing that was negative. It will be important for her to keep in contact with genetics to keep up with whether additional testing may be needed.

## 2017-05-19 ENCOUNTER — Other Ambulatory Visit (HOSPITAL_BASED_OUTPATIENT_CLINIC_OR_DEPARTMENT_OTHER): Payer: 59

## 2017-05-19 ENCOUNTER — Encounter: Payer: Self-pay | Admitting: *Deleted

## 2017-05-19 ENCOUNTER — Ambulatory Visit (HOSPITAL_BASED_OUTPATIENT_CLINIC_OR_DEPARTMENT_OTHER): Payer: 59 | Admitting: Oncology

## 2017-05-19 VITALS — BP 96/51 | HR 72 | Temp 97.7°F | Resp 18 | Ht 65.5 in | Wt 188.7 lb

## 2017-05-19 DIAGNOSIS — C50812 Malignant neoplasm of overlapping sites of left female breast: Secondary | ICD-10-CM

## 2017-05-19 DIAGNOSIS — Z171 Estrogen receptor negative status [ER-]: Secondary | ICD-10-CM | POA: Diagnosis not present

## 2017-05-19 DIAGNOSIS — Z95828 Presence of other vascular implants and grafts: Secondary | ICD-10-CM

## 2017-05-19 LAB — COMPREHENSIVE METABOLIC PANEL
ALT: 30 U/L (ref 0–55)
ANION GAP: 8 meq/L (ref 3–11)
AST: 17 U/L (ref 5–34)
Albumin: 3.5 g/dL (ref 3.5–5.0)
Alkaline Phosphatase: 104 U/L (ref 40–150)
BILIRUBIN TOTAL: 0.3 mg/dL (ref 0.20–1.20)
BUN: 16.2 mg/dL (ref 7.0–26.0)
CALCIUM: 9.2 mg/dL (ref 8.4–10.4)
CHLORIDE: 102 meq/L (ref 98–109)
CO2: 28 mEq/L (ref 22–29)
CREATININE: 0.8 mg/dL (ref 0.6–1.1)
EGFR: 81 mL/min/{1.73_m2} — ABNORMAL LOW (ref >90)
Glucose: 110 mg/dl (ref 70–140)
Potassium: 4.3 mEq/L (ref 3.5–5.1)
Sodium: 138 mEq/L (ref 136–145)
TOTAL PROTEIN: 6.2 g/dL — AB (ref 6.4–8.3)

## 2017-05-19 LAB — CBC WITH DIFFERENTIAL/PLATELET
BASO%: 0.4 % (ref 0.0–2.0)
Basophils Absolute: 0 10*3/uL (ref 0.0–0.1)
EOS ABS: 0 10*3/uL (ref 0.0–0.5)
EOS%: 0 % (ref 0.0–7.0)
HEMATOCRIT: 34.7 % — AB (ref 34.8–46.6)
HGB: 11 g/dL — ABNORMAL LOW (ref 11.6–15.9)
LYMPH#: 4.3 10*3/uL — AB (ref 0.9–3.3)
LYMPH%: 51.9 % — ABNORMAL HIGH (ref 14.0–49.7)
MCH: 29.3 pg (ref 25.1–34.0)
MCHC: 31.7 g/dL (ref 31.5–36.0)
MCV: 92.5 fL (ref 79.5–101.0)
MONO#: 1.5 10*3/uL — AB (ref 0.1–0.9)
MONO%: 18.5 % — ABNORMAL HIGH (ref 0.0–14.0)
NEUT#: 2.4 10*3/uL (ref 1.5–6.5)
NEUT%: 29.2 % — AB (ref 38.4–76.8)
PLATELETS: 309 10*3/uL (ref 145–400)
RBC: 3.75 10*6/uL (ref 3.70–5.45)
RDW: 16.2 % — ABNORMAL HIGH (ref 11.2–14.5)
WBC: 8.3 10*3/uL (ref 3.9–10.3)

## 2017-05-19 MED ORDER — SODIUM CHLORIDE 0.9% FLUSH
10.0000 mL | Freq: Once | INTRAVENOUS | Status: AC
  Filled 2017-05-19: qty 10

## 2017-05-19 MED ORDER — HEPARIN SOD (PORK) LOCK FLUSH 100 UNIT/ML IV SOLN
500.0000 [IU] | Freq: Once | INTRAVENOUS | Status: AC
  Filled 2017-05-19: qty 5

## 2017-05-19 NOTE — Patient Instructions (Signed)
Implanted Port Home Guide An implanted port is a type of central line that is placed under the skin. Central lines are used to provide IV access when treatment or nutrition needs to be given through a person's veins. Implanted ports are used for long-term IV access. An implanted port may be placed because:  You need IV medicine that would be irritating to the small veins in your hands or arms.  You need long-term IV medicines, such as antibiotics.  You need IV nutrition for a long period.  You need frequent blood draws for lab tests.  You need dialysis.  Implanted ports are usually placed in the chest area, but they can also be placed in the upper arm, the abdomen, or the leg. An implanted port has two main parts:  Reservoir. The reservoir is round and will appear as a small, raised area under your skin. The reservoir is the part where a needle is inserted to give medicines or draw blood.  Catheter. The catheter is a thin, flexible tube that extends from the reservoir. The catheter is placed into a large vein. Medicine that is inserted into the reservoir goes into the catheter and then into the vein.  How will I care for my incision site? Do not get the incision site wet. Bathe or shower as directed by your health care provider. How is my port accessed? Special steps must be taken to access the port:  Before the port is accessed, a numbing cream can be placed on the skin. This helps numb the skin over the port site.  Your health care provider uses a sterile technique to access the port. ? Your health care provider must put on a mask and sterile gloves. ? The skin over your port is cleaned carefully with an antiseptic and allowed to dry. ? The port is gently pinched between sterile gloves, and a needle is inserted into the port.  Only "non-coring" port needles should be used to access the port. Once the port is accessed, a blood return should be checked. This helps ensure that the port  is in the vein and is not clogged.  If your port needs to remain accessed for a constant infusion, a clear (transparent) bandage will be placed over the needle site. The bandage and needle will need to be changed every week, or as directed by your health care provider.  Keep the bandage covering the needle clean and dry. Do not get it wet. Follow your health care provider's instructions on how to take a shower or bath while the port is accessed.  If your port does not need to stay accessed, no bandage is needed over the port.  What is flushing? Flushing helps keep the port from getting clogged. Follow your health care provider's instructions on how and when to flush the port. Ports are usually flushed with saline solution or a medicine called heparin. The need for flushing will depend on how the port is used.  If the port is used for intermittent medicines or blood draws, the port will need to be flushed: ? After medicines have been given. ? After blood has been drawn. ? As part of routine maintenance.  If a constant infusion is running, the port may not need to be flushed.  How long will my port stay implanted? The port can stay in for as long as your health care provider thinks it is needed. When it is time for the port to come out, surgery will be   done to remove it. The procedure is similar to the one performed when the port was put in. When should I seek immediate medical care? When you have an implanted port, you should seek immediate medical care if:  You notice a bad smell coming from the incision site.  You have swelling, redness, or drainage at the incision site.  You have more swelling or pain at the port site or the surrounding area.  You have a fever that is not controlled with medicine.  This information is not intended to replace advice given to you by your health care provider. Make sure you discuss any questions you have with your health care provider. Document  Released: 09/29/2005 Document Revised: 03/06/2016 Document Reviewed: 06/06/2013 Elsevier Interactive Patient Education  2017 Elsevier Inc.  

## 2017-05-19 NOTE — Progress Notes (Signed)
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 She is receiving carboplatin and gemcitabine chemotherapy together with trastuzumab every 3 weeks. She received Pertuzumab cycle 1 with very poor tolerance. This was omitted from cycle 2 and added again with cycle 3 but not at loading dose. Today is day 8 cycle 3 of 6 planned cycles of this regimen.  Her most recent echocardiogram was 03/20/2017 showing an ejection fraction of 55%.   REVIEW OF SYSTEMS: The good news is that Maria Hester had absolutely no diarrhea at the current dose of Pertuzumab. She took Questran prophylactically 2 days and she ended up constipated. Aside from that issue, she's had no nausea or vomiting, no mouth sores, and the only symptoms she is experiencing is fatigued. She's been able to do some housework and a little shopping for school close with her grandchildren. A detailed review of systems today was otherwise stable   BREAST CANCER HISTORY: From the original intake note:  Maria Hester herself noted a change in her left breast when looking into a mirror and then palpating a mass in the upper-outer quadrant. She brought this to medical attention and on 02/24/2017 underwent bilateral diagnostic  mammography with tomography and left breast ultrasonography at the breast Center. The breast density was category C. In the upper portion of the left breast there was a broad area of distortion measuring up to 7.5 cm. On exam there was a broad firm area involving the upper outer quadrant of the left breast which was visibly protruding. By ultrasound at the 11:00 axis 8 cm from the nipple there was an irregular hypoechoic mass measuring 1.4 cm with additiona ases at 2:00, 3 cm from the nipple and multiple other masses as described, all in the upper-outer quadrant primarily. Ultrasound of the left axilla showed at least 3 prominent lymph nodes one of which had a cortical bulge.  On 03/02/2017 the patient underwent biopsy of a left breast mass at the 11:30 o'clock position and a second mass described as upper outer quadrant as well as one of the suspicious left axillary lymph nodes. These all showed invasive ductal carcinoma, grade 3. Prognostic panel from one of the 2 masses showed the tumor to be estrogen and progesterone receptor negative, but HER-2 amplified, with a signals ratio of 8.24 and the number per cell 15.65.  Her subsequent history is as detailed below   PAST MEDICAL HISTORY: Past Medical History:  Diagnosis Date  . Breast cancer ([place])   . 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 Complication of anesthesia    pt states BP tends to drop and has a hard time waking up - pt was told by prior anesthesiologist to make sure this was made known for furture surgeries  . Family history of breast cancer   . Uterine fibroid  PAST SURGICAL HISTORY: Past Surgical History:  Procedure Laterality Date  . ABDOMINAL HYSTERECTOMY    . ABDOMINOPLASTY N/A 02/28/2014   Procedure: ABDOMINOPLASTY with Panniculectomy;  Surgeon: [name], MD;  Location: [facility];  Service: Gynecology;  Laterality: N/A;  . APPENDECTOMY    . BILATERAL  SALPINGECTOMY Bilateral 02/28/2014   Procedure: BILATERAL SALPINGECTOMY;  Surgeon: [name], MD;  Location: [facility];  Service: Gynecology;  Laterality: Bilateral;  . CATARACT EXTRACTION, BILATERAL    . PORTACATH PLACEMENT Right 03/18/2017   Procedure: INSERTION PORT-A-CATH;  Surgeon: Excell Seltzer, MD;  Location: [facility];  Service: General;  Laterality: Right;  . reverse tubal ligation    . SCAR REVISION N/A 02/28/2014   Procedure: SCAR REVISION;  Surgeon: [name], MD;  Location: [facility];  Service: Gynecology;  Laterality: N/A;  . SUPRACERVICAL ABDOMINAL HYSTERECTOMY N/A 02/28/2014   Procedure: HYSTERECTOMY SUPRACERVICAL ABDOMINAL;  Surgeon: [name], MD;  Location: [facility];  Service: Gynecology;  Laterality: N/A;  . TUBAL LIGATION    . WISDOM TOOTH EXTRACTION      FAMILY HISTORY Family History  Problem Relation Age of Onset  . Heart disease Mother   . Hypertension Mother   . Diabetes Father   . Heart disease Father   . Heart disease Maternal Grandmother   . Colon cancer Maternal Grandmother 76       died in her 75's  . Heart disease Maternal Grandfather   . Esophageal cancer Maternal Grandfather 50       died at 81  . Diabetes Paternal Grandmother   . Heart disease Paternal Grandmother   . Kidney cancer Paternal Grandmother 74       died in her 14's  . Tuberculosis Paternal Grandfather   . Heart disease Paternal Grandfather   . Skin cancer Paternal Grandfather 96       died at 63, patient not sure if it was melanoma, BCC, Squamous, etc.  . Breast cancer Maternal Aunt 50       died at 54  . Breast cancer Cousin 60       she is now in her 31's  The patient's father died from heart disease at age 37. The patient's mother is living at age 56. The patient has one brother, 2 sisters. On the mother's side and aunt had breast cancer at age 7 and her daughter had breast cancer in her early 75s. There is also a grandfather with esophageal cancer and a grandmother with  colon cancer. On the paternal side there is a grandmother with kidney cancer at an early age and a grandfather with melanoma at an early age.  GYNECOLOGIC HISTORY:  Patient's last menstrual period was 02/11/2014.  Menarche age 47, first live birth age 27, the patient is [place] P2. She underwent hysterectomy without salpingo-oophorectomy May 2016. She did not use hormone replacement. She did use oral contraceptives approximately 10 years in the 1980s.  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 Her husband Fish farm manager in the same [place], which is a 10% aviation enrollment. Daughter Lorenda Hatchet is a hairstylist in [place] and son Chip Boer is a Physiological scientist in [place] the patient has 2 grandsons and one granddaughter. She attends a [employer]    ADVANCED DIRECTIVES: The patient has completed advanced directives and will have the motorized at the next visit   HEALTH MAINTENANCE: Social History  Substance Use Topics  .  Smoking status: Former Smoker    Packs/day: 0.50    Years: 34.00    Types: Cigarettes  . Smokeless tobacco: Never Used  . Alcohol use No     Comment: social drink      Colonoscopy:No  PAP: 03/04/2017  Bone density: No   No Known Allergies  Current Outpatient Prescriptions  Medication Sig Dispense Refill  . ALPRAZolam (XANAX) 0.5 MG tablet TAKE HALF TO 1 TABLET BY MOUTH EVERY 8 HOURS AS NEEDED FOR ANXIETY  2  . cholestyramine (QUESTRAN) 4 GM/DOSE powder Take 1 packet (4 g total) by mouth 2 (two) times daily. 378 g 12  . dexamethasone (DECADRON) 4 MG tablet Take 2 tablets (8 mg total) by mouth 2 (two) times daily. Start the day before Taxotere. Then again the day after chemo for 3 days. 30 tablet 1  . lidocaine-prilocaine (EMLA) cream Apply to affected area once 30 g 3  . LORazepam (ATIVAN) 0.5 MG tablet TAKE 1 TABLET BY MOUTH AT BEDTIME AS NEEDED FOR NAUSEA AND VOMITING 30 tablet 0  .  metroNIDAZOLE (METROGEL) 1 % gel Apply topically daily. 45 g 0  . ondansetron (ZOFRAN) 8 MG tablet Take 1 tablet (8 mg total) by mouth every 8 (eight) hours as needed for nausea or vomiting. 30 tablet 3  . prochlorperazine (COMPAZINE) 10 MG tablet Take 1 tablet (10 mg total) by mouth every 6 (six) hours as needed (Nausea or vomiting). 30 tablet 1   No current facility-administered medications for this visit.    Facility-Administered Medications Ordered in Other Visits  Medication Dose Route Frequency Provider Last Rate Last Dose  . heparin lock flush 100 unit/mL  500 Units Intracatheter Once Fusako Tanabe, Virgie Dad, MD      . sodium chloride flush (NS) 0.9 % injection 10 mL  10 mL Intracatheter Once Derhonda Eastlick, Virgie Dad, MD        OBJECTIVE:Middle-aged white woman who appears stated age  Vitals:   05/19/17 1016  BP: (!) 96/51  Pulse: 72  Resp: 18  Temp: 97.7 F (36.5 C)     Body mass index is 30.92 kg/m.    ECOG FS:1 - Symptomatic but completely ambulatory  Sclerae unicteric, EOMs intact Oropharynx clear and moist No cervical or supraclavicular adenopathy Lungs no rales or rhonchi Heart regular rate and rhythm Abd soft, nontender, positive bowel sounds MSK no focal spinal tenderness, no upper extremity lymphedema Neuro: nonfocal, well oriented, appropriate affect Breasts: The right breast is benign. I do not palpate a mass in the left breast. Both axillae are benign.    LAB RESULTS:  CMP     Component Value Date/Time   NA 138 05/19/2017 0942   K 4.3 05/19/2017 0942   CL 106 03/01/2014 0607   CO2 28 05/19/2017 0942   GLUCOSE 110 05/19/2017 0942   BUN 16.2 05/19/2017 0942   CREATININE 0.8 05/19/2017 0942   CALCIUM 9.2 05/19/2017 0942   PROT 6.2 (L) 05/19/2017 0942   ALBUMIN 3.5 05/19/2017 0942   AST 17 05/19/2017 0942   ALT 30 05/19/2017 0942   ALKPHOS 104 05/19/2017 0942   BILITOT 0.30 05/19/2017 0942   GFRNONAA >90 03/01/2014 0607   GFRAA >90 03/01/2014 0607    No  results found for: TOTALPROTELP, ALBUMINELP, A1GS, A2GS, BETS, BETA2SER, GAMS, MSPIKE, SPEI  No results found for: KPAFRELGTCHN, LAMBDASER, KAPLAMBRATIO  Lab Results  Component Value Date   WBC 8.3 05/19/2017   NEUTROABS 2.4 05/19/2017   HGB 11.0 (L) 05/19/2017  HCT 34.7 (L) 05/19/2017   MCV 92.5 05/19/2017   PLT 309 05/19/2017      Chemistry      Component Value Date/Time   NA 138 05/19/2017 0942   K 4.3 05/19/2017 0942   CL 106 03/01/2014 0607   CO2 28 05/19/2017 0942   BUN 16.2 05/19/2017 0942   CREATININE 0.8 05/19/2017 0942      Component Value Date/Time   CALCIUM 9.2 05/19/2017 0942   ALKPHOS 104 05/19/2017 0942   AST 17 05/19/2017 0942   ALT 30 05/19/2017 0942   BILITOT 0.30 05/19/2017 0942       No results found for: LABCA2  No components found for: [id number]  No results for input(s): INR in the last 168 hours.  Urinalysis    Component Value Date/Time   COLORURINE YELLOW 02/23/2014 1004   APPEARANCEUR CLEAR 02/23/2014 1004   LABSPEC 1.025 02/23/2014 1004   PHURINE 5.5 02/23/2014 1004   GLUCOSEU NEGATIVE 02/23/2014 1004   HGBUR NEGATIVE 02/23/2014 1004   BILIRUBINUR NEGATIVE 02/23/2014 1004   KETONESUR NEGATIVE 02/23/2014 1004   PROTEINUR NEGATIVE 02/23/2014 1004   UROBILINOGEN 0.2 02/23/2014 1004   NITRITE NEGATIVE 02/23/2014 1004   LEUKOCYTESUR NEGATIVE 02/23/2014 1004     STUDIES: No results found.  ELIGIBLE FOR AVAILABLE RESEARCH PROTOCOL: [place] study  ASSESSMENT: 51 y.o. [place], [place] woman status post left breast overlapping sites biopsy 03/02/2017 for a clinical T3 N1-2, stage IIIA invasive ductal carcinoma, grade 3, estrogen and progesterone receptor negative, HER-2 amplified, with an MIB-1 of 35%  (1) genetics testing07/24/2018 through the Hereditary Gene Panel offered by [facility] found no deleterious mutations in APC, ATM, AXIN2, BARD1, BMPR1A, BRCA1, BRCA2, BRIP1, CDH1, CDKN2A (p14ARF), CDKN2A (p16INK4a), CHEK2, CTNNA1, DICER1, EPCAM  (Deletion/duplication testing only), GREM1 (promoter region deletion/duplication testing only), KIT, MEN1, MLH1, MSH2, MSH3, MSH6, MUTYH, NBN, NF1, NHTL1, PALB2, PDGFRA, PMS2, POLD1, POLE, PTEN, RAD50, RAD51C, RAD51D, SDHB, SDHC, SDHD, SMAD4, SMARCA4. STK11, TP53, TSC1, TSC2, and VHL.  The following genes were evaluated for sequence changes only: SDHA and HOXB13 c.251G>A variant only.  (2) neoadjuvant chemotherapy consisting of carboplatin and Docetaxel given with trastuzumab and Pertuzumab every 21 days for 6 cycles, starting 03/31/2017  (a) Pertuzumab stopped after cycle 1 secondary to side effects     (b) Pertuzumab resumed cycle 3, not at loading dose.  (3) trastuzumab will be continued to complete 6 months  (a) echocardiogram 03/20/2017 showed an ejection fraction of 55%   (4) definitive surgery to follow chemotherapy  (5) adjuvant radiation to follow surgery   PLAN: Maria Hester tolerated cycle 3 remarkably well, and the diarrhea issue has been resolved. Accordingly I anticipate no significant problems moving on with her final 3 cycles, the next one of which is scheduled for 06/02/2017.  Of course if she starts to develop neuropathy or other side effects from Taxotere we would discontinue the drug and switch to gemcitabine.  Since she is having standard chemotherapy, I think 6 months of trastuzumab will be adequate.  She needs to start planning her surgical dates because of insurance reasons. Basically her current disability and set the end of October and she needs to be back to work no later than the first week in November. Her last chemotherapy is scheduled for 07-2017. She will be discussing surgical dates and when she would be able to return to work with Dr. Excell Seltzer at her next visit with him  She would like to receive radiation here even though it is a long drive  from [place].  I am scheduling her for a day 8 visit on August 28 but if everything is well that day she will not need day  8 visits with her last 2 cycles of chemotherapy  She knows to call for any other problems that may develop before then.      Gardenia Phlegm, NP 04/22/2017 5:56 PM Medical Oncology and Hematology [facility] [address] [place], [place] [address] Tel. [phone]    Fax. [phone]

## 2017-06-02 ENCOUNTER — Other Ambulatory Visit: Payer: 59

## 2017-06-02 ENCOUNTER — Other Ambulatory Visit (HOSPITAL_BASED_OUTPATIENT_CLINIC_OR_DEPARTMENT_OTHER): Payer: 59

## 2017-06-02 ENCOUNTER — Ambulatory Visit (HOSPITAL_BASED_OUTPATIENT_CLINIC_OR_DEPARTMENT_OTHER): Payer: 59 | Admitting: Oncology

## 2017-06-02 ENCOUNTER — Ambulatory Visit: Payer: 59

## 2017-06-02 ENCOUNTER — Ambulatory Visit (HOSPITAL_BASED_OUTPATIENT_CLINIC_OR_DEPARTMENT_OTHER): Payer: 59

## 2017-06-02 VITALS — BP 105/52 | HR 54 | Temp 98.0°F | Resp 18 | Ht 65.5 in | Wt 190.0 lb

## 2017-06-02 DIAGNOSIS — Z171 Estrogen receptor negative status [ER-]: Principal | ICD-10-CM

## 2017-06-02 DIAGNOSIS — C50812 Malignant neoplasm of overlapping sites of left female breast: Secondary | ICD-10-CM

## 2017-06-02 DIAGNOSIS — Z5112 Encounter for antineoplastic immunotherapy: Secondary | ICD-10-CM | POA: Diagnosis not present

## 2017-06-02 DIAGNOSIS — Z5111 Encounter for antineoplastic chemotherapy: Secondary | ICD-10-CM | POA: Diagnosis not present

## 2017-06-02 DIAGNOSIS — Z5189 Encounter for other specified aftercare: Secondary | ICD-10-CM | POA: Diagnosis not present

## 2017-06-02 DIAGNOSIS — Z95828 Presence of other vascular implants and grafts: Secondary | ICD-10-CM

## 2017-06-02 LAB — CBC WITH DIFFERENTIAL/PLATELET
BASO%: 0.6 % (ref 0.0–2.0)
BASOS ABS: 0.1 10*3/uL (ref 0.0–0.1)
EOS%: 0 % (ref 0.0–7.0)
Eosinophils Absolute: 0 10*3/uL (ref 0.0–0.5)
HCT: 32.4 % — ABNORMAL LOW (ref 34.8–46.6)
HGB: 10.9 g/dL — ABNORMAL LOW (ref 11.6–15.9)
LYMPH%: 13.2 % — AB (ref 14.0–49.7)
MCH: 30.2 pg (ref 25.1–34.0)
MCHC: 33.7 g/dL (ref 31.5–36.0)
MCV: 89.7 fL (ref 79.5–101.0)
MONO#: 0.7 10*3/uL (ref 0.1–0.9)
MONO%: 5.2 % (ref 0.0–14.0)
NEUT#: 10.4 10*3/uL — ABNORMAL HIGH (ref 1.5–6.5)
NEUT%: 81 % — AB (ref 38.4–76.8)
Platelets: 275 10*3/uL (ref 145–400)
RBC: 3.62 10*6/uL — AB (ref 3.70–5.45)
RDW: 17.7 % — ABNORMAL HIGH (ref 11.2–14.5)
WBC: 12.8 10*3/uL — ABNORMAL HIGH (ref 3.9–10.3)
lymph#: 1.7 10*3/uL (ref 0.9–3.3)

## 2017-06-02 LAB — COMPREHENSIVE METABOLIC PANEL
ALT: 27 U/L (ref 0–55)
AST: 17 U/L (ref 5–34)
Albumin: 3.4 g/dL — ABNORMAL LOW (ref 3.5–5.0)
Alkaline Phosphatase: 79 U/L (ref 40–150)
Anion Gap: 11 mEq/L (ref 3–11)
BUN: 16.1 mg/dL (ref 7.0–26.0)
CALCIUM: 9.9 mg/dL (ref 8.4–10.4)
CHLORIDE: 106 meq/L (ref 98–109)
CO2: 23 meq/L (ref 22–29)
CREATININE: 0.8 mg/dL (ref 0.6–1.1)
EGFR: 89 mL/min/{1.73_m2} — ABNORMAL LOW (ref >90)
Glucose: 163 mg/dl — ABNORMAL HIGH (ref 70–140)
POTASSIUM: 4.1 meq/L (ref 3.5–5.1)
Sodium: 140 mEq/L (ref 136–145)
Total Bilirubin: 0.27 mg/dL (ref 0.20–1.20)
Total Protein: 6.7 g/dL (ref 6.4–8.3)

## 2017-06-02 MED ORDER — SODIUM CHLORIDE 0.9% FLUSH
10.0000 mL | INTRAVENOUS | Status: DC | PRN
  Administered 2017-06-02: 10 mL
  Filled 2017-06-02: qty 10

## 2017-06-02 MED ORDER — DOCETAXEL CHEMO INJECTION 160 MG/16ML
75.0000 mg/m2 | Freq: Once | INTRAVENOUS | Status: AC
  Administered 2017-06-02: 150 mg via INTRAVENOUS
  Filled 2017-06-02: qty 15

## 2017-06-02 MED ORDER — DIPHENHYDRAMINE HCL 25 MG PO CAPS
ORAL_CAPSULE | ORAL | Status: AC
  Filled 2017-06-02: qty 1

## 2017-06-02 MED ORDER — PEGFILGRASTIM 6 MG/0.6ML ~~LOC~~ PSKT
6.0000 mg | PREFILLED_SYRINGE | Freq: Once | SUBCUTANEOUS | Status: AC
  Administered 2017-06-02: 6 mg via SUBCUTANEOUS
  Filled 2017-06-02: qty 0.6

## 2017-06-02 MED ORDER — SODIUM CHLORIDE 0.9% FLUSH
10.0000 mL | Freq: Once | INTRAVENOUS | Status: AC
  Administered 2017-06-02: 10 mL
  Filled 2017-06-02: qty 10

## 2017-06-02 MED ORDER — SODIUM CHLORIDE 0.9 % IV SOLN
702.5000 mg | Freq: Once | INTRAVENOUS | Status: AC
  Administered 2017-06-02: 700 mg via INTRAVENOUS
  Filled 2017-06-02: qty 70

## 2017-06-02 MED ORDER — TRASTUZUMAB CHEMO 150 MG IV SOLR
6.0000 mg/kg | Freq: Once | INTRAVENOUS | Status: AC
  Administered 2017-06-02: 525 mg via INTRAVENOUS
  Filled 2017-06-02: qty 25

## 2017-06-02 MED ORDER — PALONOSETRON HCL INJECTION 0.25 MG/5ML
0.2500 mg | Freq: Once | INTRAVENOUS | Status: AC
  Administered 2017-06-02: 0.25 mg via INTRAVENOUS

## 2017-06-02 MED ORDER — ACETAMINOPHEN 325 MG PO TABS
650.0000 mg | ORAL_TABLET | Freq: Once | ORAL | Status: AC
  Administered 2017-06-02: 650 mg via ORAL

## 2017-06-02 MED ORDER — DEXAMETHASONE SODIUM PHOSPHATE 10 MG/ML IJ SOLN
INTRAMUSCULAR | Status: AC
  Filled 2017-06-02: qty 1

## 2017-06-02 MED ORDER — ACETAMINOPHEN 325 MG PO TABS
ORAL_TABLET | ORAL | Status: AC
  Filled 2017-06-02: qty 2

## 2017-06-02 MED ORDER — SODIUM CHLORIDE 0.9 % IV SOLN
420.0000 mg | Freq: Once | INTRAVENOUS | Status: AC
  Administered 2017-06-02: 420 mg via INTRAVENOUS
  Filled 2017-06-02: qty 14

## 2017-06-02 MED ORDER — SODIUM CHLORIDE 0.9 % IV SOLN
Freq: Once | INTRAVENOUS | Status: AC
  Administered 2017-06-02: 10:00:00 via INTRAVENOUS

## 2017-06-02 MED ORDER — HEPARIN SOD (PORK) LOCK FLUSH 100 UNIT/ML IV SOLN
500.0000 [IU] | Freq: Once | INTRAVENOUS | Status: AC | PRN
  Administered 2017-06-02: 500 [IU]
  Filled 2017-06-02: qty 5

## 2017-06-02 MED ORDER — DEXAMETHASONE SODIUM PHOSPHATE 10 MG/ML IJ SOLN
10.0000 mg | Freq: Once | INTRAMUSCULAR | Status: AC
  Administered 2017-06-02: 10 mg via INTRAVENOUS

## 2017-06-02 MED ORDER — DIPHENHYDRAMINE HCL 25 MG PO CAPS
25.0000 mg | ORAL_CAPSULE | Freq: Once | ORAL | Status: AC
  Administered 2017-06-02: 25 mg via ORAL

## 2017-06-02 MED ORDER — PALONOSETRON HCL INJECTION 0.25 MG/5ML
INTRAVENOUS | Status: AC
  Filled 2017-06-02: qty 5

## 2017-06-02 NOTE — Progress Notes (Signed)
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 Today is day 1 cycle 4 of 6 planned cycles of chemoimmunotherapy, given every 3 weeks, to be followed by surgery then radiation.  Her most recent echocardiogram was 03/20/2017 showing an ejection fraction of 55%. She will be due for repeat echocardiography sometime in September   REVIEW OF SYSTEMS: I was very worried about diarrhea but actually quite Shianne had was severe constipation. This is due to the [place]. She took it as a preventative. She was still constipated she almost thought she would pass out. Finally she got that problem taken care of. She did have some bloating and abdominal discomfort, which she is treating with Nexium. Aside from these issues a detailed review of systems today was stable  BREAST CANCER HISTORY: From the original intake note:  [name] herself noted a change in her left breast when looking into a mirror and then palpating a mass in the upper-outer quadrant. She brought this to medical attention and on 02/24/2017 underwent bilateral diagnostic mammography with tomography and left breast ultrasonography at the breast Center. The breast density was category C. In the upper portion of the left  breast there was a broad area of distortion measuring up to 7.5 cm. On exam there was a broad firm area involving the upper outer quadrant of the left breast which was visibly protruding. By ultrasound at the 11:00 axis 8 cm from the nipple there was an irregular hypoechoic mass measuring 1.4 cm with additiona ases at 2:00, 3 cm from the nipple and multiple other masses as described, all in the upper-outer quadrant primarily. Ultrasound of the left axilla showed at least 3 prominent lymph nodes one of which had a cortical bulge.  On 03/02/2017 the patient underwent biopsy of a left breast mass at the 11:30 o'clock position and a second mass described as upper outer quadrant as well as one of the suspicious left axillary lymph nodes. These all showed invasive ductal carcinoma, grade 3. Prognostic panel from one of the 2 masses showed the tumor to be estrogen and progesterone receptor negative, but HER-2 amplified, with a signals ratio of 8.24 and the number per cell 15.65.  Her subsequent history is as detailed below   PAST MEDICAL HISTORY: Past Medical History:  Diagnosis Date  . Breast cancer ([place])   . 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 Complication of anesthesia    pt states BP tends to drop and has a hard time waking up - pt was told by prior anesthesiologist to make sure this was made known for furture surgeries  . Family history of breast cancer   . Uterine fibroid     PAST SURGICAL HISTORY: Past Surgical History:  Procedure Laterality Date  . ABDOMINAL HYSTERECTOMY    . ABDOMINOPLASTY N/A 02/28/2014   Procedure: ABDOMINOPLASTY with  Panniculectomy;  Surgeon: [name], MD;  Location: [facility];  Service: Gynecology;  Laterality: N/A;  . APPENDECTOMY    . BILATERAL SALPINGECTOMY Bilateral 02/28/2014   Procedure: BILATERAL SALPINGECTOMY;  Surgeon: [name], MD;  Location: [facility];  Service: Gynecology;  Laterality:  Bilateral;  . CATARACT EXTRACTION, BILATERAL    . PORTACATH PLACEMENT Right 03/18/2017   Procedure: INSERTION PORT-A-CATH;  Surgeon: Excell Seltzer, MD;  Location: [facility];  Service: General;  Laterality: Right;  . reverse tubal ligation    . SCAR REVISION N/A 02/28/2014   Procedure: SCAR REVISION;  Surgeon: [name], MD;  Location: [facility];  Service: Gynecology;  Laterality: N/A;  . SUPRACERVICAL ABDOMINAL HYSTERECTOMY N/A 02/28/2014   Procedure: HYSTERECTOMY SUPRACERVICAL ABDOMINAL;  Surgeon: [name], MD;  Location: [facility];  Service: Gynecology;  Laterality: N/A;  . TUBAL LIGATION    . WISDOM TOOTH EXTRACTION      FAMILY HISTORY Family History  Problem Relation Age of Onset  . Heart disease Mother   . Hypertension Mother   . Diabetes Father   . Heart disease Father   . Heart disease Maternal Grandmother   . Colon cancer Maternal Grandmother 64       died in her 39's  . Heart disease Maternal Grandfather   . Esophageal cancer Maternal Grandfather 50       died at 50  . Diabetes Paternal Grandmother   . Heart disease Paternal Grandmother   . Kidney cancer Paternal Grandmother 26       died in her 29's  . Tuberculosis Paternal Grandfather   . Heart disease Paternal Grandfather   . Skin cancer Paternal Grandfather 36       died at 56, patient not sure if it was melanoma, BCC, Squamous, etc.  . Breast cancer Maternal Aunt 50       died at 4  . Breast cancer Cousin 37       she is now in her 50's  The patient's father died from heart disease at age 37. The patient's mother is living at age 15. The patient has one brother, 2 sisters. On the mother's side and aunt had breast cancer at age 76 and her daughter had breast cancer in her early 16s. There is also a grandfather with esophageal cancer and a grandmother with colon cancer. On the paternal side there is a grandmother with kidney cancer at an early age and a grandfather with melanoma at an early age.  GYNECOLOGIC  HISTORY:  Patient's last menstrual period was 02/11/2014.  Menarche age 28, first live birth age 38, the patient is [place] P2. She underwent hysterectomy without salpingo-oophorectomy May 2016. She did not use hormone replacement. She did use oral contraceptives approximately 10 years in the 1980s.  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 Her husband Fish farm manager in the same [place], which is a 10% aviation enrollment. Daughter Lorenda Hatchet is a hairstylist in [place] and son Chip Boer is a Physiological scientist in [place] the patient has 2 grandsons and one granddaughter. She attends a [employer]    ADVANCED DIRECTIVES: The patient has completed advanced directives and will have the motorized at the next visit   HEALTH MAINTENANCE: Social History  Substance Use Topics  . Smoking status: Former Smoker    Packs/day: 0.50    Years: 34.00    Types: Cigarettes  . Smokeless tobacco: Never Used  .  Alcohol use No     Comment: social drink      Colonoscopy:No  PAP: 03/04/2017  Bone density: No   No Known Allergies  Current Outpatient Prescriptions  Medication Sig Dispense Refill  . ALPRAZolam (XANAX) 0.5 MG tablet TAKE HALF TO 1 TABLET BY MOUTH EVERY 8 HOURS AS NEEDED FOR ANXIETY  2  . cholestyramine (QUESTRAN) 4 GM/DOSE powder Take 1 packet (4 g total) by mouth 2 (two) times daily. 378 g 12  . dexamethasone (DECADRON) 4 MG tablet Take 2 tablets (8 mg total) by mouth 2 (two) times daily. Start the day before Taxotere. Then again the day after chemo for 3 days. 30 tablet 1  . lidocaine-prilocaine (EMLA) cream Apply to affected area once 30 g 3  . LORazepam (ATIVAN) 0.5 MG tablet TAKE 1 TABLET BY MOUTH AT BEDTIME AS NEEDED FOR NAUSEA AND VOMITING 30 tablet 0  . metroNIDAZOLE (METROGEL) 1 % gel Apply topically daily. 45 g 0  . ondansetron (ZOFRAN) 8 MG tablet Take 1 tablet (8 mg total) by mouth every 8 (eight) hours  as needed for nausea or vomiting. 30 tablet 3  . prochlorperazine (COMPAZINE) 10 MG tablet Take 1 tablet (10 mg total) by mouth every 6 (six) hours as needed (Nausea or vomiting). 30 tablet 1   No current facility-administered medications for this visit.    Facility-Administered Medications Ordered in Other Visits  Medication Dose Route Frequency Provider Last Rate Last Dose  . heparin lock flush 100 unit/mL  500 Units Intracatheter Once Magrinat, Virgie Dad, MD      . sodium chloride flush (NS) 0.9 % injection 10 mL  10 mL Intracatheter Once Magrinat, Virgie Dad, MD        OBJECTIVE:Middle-aged white woman In no acute distress  Vitals:   06/02/17 0901  BP: (!) 105/52  Pulse: (!) 54  Resp: 18  Temp: 98 F (36.7 C)  SpO2: 100%     Body mass index is 31.14 kg/m.    ECOG FS:1 - Symptomatic but completely ambulatory  Sclerae unicteric, pupils round and equal Oropharynx clear and moist No cervical or supraclavicular adenopathy Lungs no rales or rhonchi Heart regular rate and rhythm Abd soft, nontender, positive bowel sounds MSK no focal spinal tenderness, no upper extremity lymphedema Neuro: nonfocal, well oriented, appropriate affect Breasts: I do not palpate a mass in either breast. Both axillae are benign.   LAB RESULTS:  CMP     Component Value Date/Time   NA 138 05/19/2017 0942   K 4.3 05/19/2017 0942   CL 106 03/01/2014 0607   CO2 28 05/19/2017 0942   GLUCOSE 110 05/19/2017 0942   BUN 16.2 05/19/2017 0942   CREATININE 0.8 05/19/2017 0942   CALCIUM 9.2 05/19/2017 0942   PROT 6.2 (L) 05/19/2017 0942   ALBUMIN 3.5 05/19/2017 0942   AST 17 05/19/2017 0942   ALT 30 05/19/2017 0942   ALKPHOS 104 05/19/2017 0942   BILITOT 0.30 05/19/2017 0942   GFRNONAA >90 03/01/2014 0607   GFRAA >90 03/01/2014 0607    No results found for: TOTALPROTELP, ALBUMINELP, A1GS, A2GS, BETS, BETA2SER, GAMS, MSPIKE, SPEI  No results found for: KPAFRELGTCHN, LAMBDASER, KAPLAMBRATIO  Lab  Results  Component Value Date   WBC 12.8 (H) 06/02/2017   NEUTROABS 10.4 (H) 06/02/2017   HGB 10.9 (L) 06/02/2017   HCT 32.4 (L) 06/02/2017   MCV 89.7 06/02/2017   PLT 275 06/02/2017      Chemistry      Component  Value Date/Time   NA 138 05/19/2017 0942   K 4.3 05/19/2017 0942   CL 106 03/01/2014 0607   CO2 28 05/19/2017 0942   BUN 16.2 05/19/2017 0942   CREATININE 0.8 05/19/2017 0942      Component Value Date/Time   CALCIUM 9.2 05/19/2017 0942   ALKPHOS 104 05/19/2017 0942   AST 17 05/19/2017 0942   ALT 30 05/19/2017 0942   BILITOT 0.30 05/19/2017 0942       No results found for: LABCA2  No components found for: [id number]  No results for input(s): INR in the last 168 hours.  Urinalysis    Component Value Date/Time   COLORURINE YELLOW 02/23/2014 1004   APPEARANCEUR CLEAR 02/23/2014 1004   LABSPEC 1.025 02/23/2014 1004   PHURINE 5.5 02/23/2014 1004   GLUCOSEU NEGATIVE 02/23/2014 1004   HGBUR NEGATIVE 02/23/2014 1004   BILIRUBINUR NEGATIVE 02/23/2014 1004   KETONESUR NEGATIVE 02/23/2014 1004   PROTEINUR NEGATIVE 02/23/2014 1004   UROBILINOGEN 0.2 02/23/2014 1004   NITRITE NEGATIVE 02/23/2014 1004   LEUKOCYTESUR NEGATIVE 02/23/2014 1004     STUDIES: No results found.  ELIGIBLE FOR AVAILABLE RESEARCH PROTOCOL: no  ASSESSMENT: 51 y.o. [place], [place] woman status post left breast overlapping sites biopsy 03/02/2017 for a clinical T3 N1-2, stage IIIA invasive ductal carcinoma, grade 3, estrogen and progesterone receptor negative, HER-2 amplified, with an MIB-1 of 35%  (1) genetics testing07/24/2018 through the Hereditary Gene Panel offered by [facility] found no deleterious mutations in APC, ATM, AXIN2, BARD1, BMPR1A, BRCA1, BRCA2, BRIP1, CDH1, CDKN2A (p14ARF), CDKN2A (p16INK4a), CHEK2, CTNNA1, DICER1, EPCAM (Deletion/duplication testing only), GREM1 (promoter region deletion/duplication testing only), KIT, MEN1, MLH1, MSH2, MSH3, MSH6, MUTYH, NBN, NF1, NHTL1, PALB2,  PDGFRA, PMS2, POLD1, POLE, PTEN, RAD50, RAD51C, RAD51D, SDHB, SDHC, SDHD, SMAD4, SMARCA4. STK11, TP53, TSC1, TSC2, and VHL.  The following genes were evaluated for sequence changes only: SDHA and HOXB13 c.251G>A variant only.  (2) neoadjuvant chemotherapy consisting of carboplatin and Docetaxel given with trastuzumab and Pertuzumab every 21 days for 6 cycles, starting 03/31/2017  (a) Pertuzumab stopped after cycle 1 secondary to side effects     (b) Pertuzumab resumed cycle 3, not at loading dose.  (3) trastuzumab will be continued to complete 6 months  (a) echocardiogram 03/20/2017 showed an ejection fraction of 55%   (4) definitive surgery to follow chemotherapy  (5) adjuvant radiation to follow surgery   PLAN: Rokia is ready to proceed to cycle 4 of 6 cycles planned of chemoimmunotherapy. She is doing remarkably well with the treatments. She actually had constipation with the last treatment so she is going to use the Questran only on an as-needed basis.  I went ahead and entered follow-up appointments for her all the way through October 2, which will be her last chemotherapy date. She will see me a week later to "close" the chemotherapy chapter and she should be having her breast MRI and surgery around that time.  She tells me she plans to have her radiation treatments here even though it is a 40 minute drive one way  She brought [place] some papers to fill out for her insurance which we will be glad to complete for her.  Otherwise she knows to call for any problems that may develop before the next visit here.       Gardenia Phlegm, NP 04/22/2017 5:56 PM Medical Oncology and Hematology [facility] [address] [place], [place] [address] Tel. [phone]    Fax. [phone]

## 2017-06-02 NOTE — Patient Instructions (Signed)
[facility] Discharge Instructions for Patients Receiving Chemotherapy  Today you received the following chemotherapy agents Taxotere, carboplatin,Herceptin and Perjeta  To help prevent nausea and vomiting after your treatment, we encourage you to take your nausea medication as directed   If you develop nausea and vomiting that is not controlled by your nausea medication, call the clinic.   BELOW ARE SYMPTOMS THAT SHOULD BE REPORTED IMMEDIATELY:  *FEVER GREATER THAN 100.5 F  *CHILLS WITH OR WITHOUT FEVER  NAUSEA AND VOMITING THAT IS NOT CONTROLLED WITH YOUR NAUSEA MEDICATION  *UNUSUAL SHORTNESS OF BREATH  *UNUSUAL BRUISING OR BLEEDING  TENDERNESS IN MOUTH AND THROAT WITH OR WITHOUT PRESENCE OF ULCERS  *URINARY PROBLEMS  *BOWEL PROBLEMS  UNUSUAL RASH Items with * indicate a potential emergency and should be followed up as soon as possible.  Feel free to call the clinic you have any questions or concerns. The clinic phone number is [phone].  Please show the CHEMO ALERT CARD at check-in to the Emergency Department and triage nurse.   

## 2017-06-08 ENCOUNTER — Other Ambulatory Visit: Payer: Self-pay | Admitting: Oncology

## 2017-06-08 DIAGNOSIS — Z171 Estrogen receptor negative status [ER-]: Principal | ICD-10-CM

## 2017-06-08 DIAGNOSIS — C50812 Malignant neoplasm of overlapping sites of left female breast: Secondary | ICD-10-CM

## 2017-06-09 ENCOUNTER — Ambulatory Visit (HOSPITAL_COMMUNITY)
Admission: RE | Admit: 2017-06-09 | Discharge: 2017-06-09 | Disposition: A | Discharge status: Home / Self Care | Payer: 59 | Source: Ambulatory Visit | Attending: Adult Health | Admitting: Adult Health

## 2017-06-09 ENCOUNTER — Other Ambulatory Visit (HOSPITAL_BASED_OUTPATIENT_CLINIC_OR_DEPARTMENT_OTHER): Payer: 59

## 2017-06-09 ENCOUNTER — Encounter: Payer: Self-pay | Admitting: Adult Health

## 2017-06-09 ENCOUNTER — Other Ambulatory Visit: Payer: Self-pay | Admitting: *Deleted

## 2017-06-09 ENCOUNTER — Ambulatory Visit (HOSPITAL_BASED_OUTPATIENT_CLINIC_OR_DEPARTMENT_OTHER): Payer: 59 | Admitting: Adult Health

## 2017-06-09 VITALS — BP 113/56 | HR 85 | Temp 97.9°F | Resp 18 | Ht 65.5 in | Wt 189.5 lb

## 2017-06-09 DIAGNOSIS — R1011 Right upper quadrant pain: Secondary | ICD-10-CM

## 2017-06-09 DIAGNOSIS — Z171 Estrogen receptor negative status [ER-]: Secondary | ICD-10-CM | POA: Diagnosis not present

## 2017-06-09 DIAGNOSIS — C50812 Malignant neoplasm of overlapping sites of left female breast: Secondary | ICD-10-CM | POA: Diagnosis not present

## 2017-06-09 DIAGNOSIS — R110 Nausea: Secondary | ICD-10-CM

## 2017-06-09 LAB — COMPREHENSIVE METABOLIC PANEL
ALK PHOS: 98 U/L (ref 40–150)
ALT: 17 U/L (ref 0–55)
ANION GAP: 7 meq/L (ref 3–11)
AST: 13 U/L (ref 5–34)
Albumin: 3.4 g/dL — ABNORMAL LOW (ref 3.5–5.0)
BUN: 15.7 mg/dL (ref 7.0–26.0)
CALCIUM: 9.3 mg/dL (ref 8.4–10.4)
CHLORIDE: 105 meq/L (ref 98–109)
CO2: 29 mEq/L (ref 22–29)
Creatinine: 0.8 mg/dL (ref 0.6–1.1)
EGFR: >90 mL/min/{1.73_m2} (ref >90)
Glucose: 104 mg/dl (ref 70–140)
POTASSIUM: 4.2 meq/L (ref 3.5–5.1)
Sodium: 140 mEq/L (ref 136–145)
Total Bilirubin: 0.31 mg/dL (ref 0.20–1.20)
Total Protein: 6.3 g/dL — ABNORMAL LOW (ref 6.4–8.3)

## 2017-06-09 LAB — CBC WITH DIFFERENTIAL/PLATELET
BASO%: 0.2 % (ref 0.0–2.0)
BASOS ABS: 0 10*3/uL (ref 0.0–0.1)
EOS%: 0 % (ref 0.0–7.0)
Eosinophils Absolute: 0 10*3/uL (ref 0.0–0.5)
HEMATOCRIT: 33.3 % — AB (ref 34.8–46.6)
HGB: 10.7 g/dL — ABNORMAL LOW (ref 11.6–15.9)
LYMPH#: 2.9 10*3/uL (ref 0.9–3.3)
LYMPH%: 49.5 % (ref 14.0–49.7)
MCH: 30.3 pg (ref 25.1–34.0)
MCHC: 32.1 g/dL (ref 31.5–36.0)
MCV: 94.3 fL (ref 79.5–101.0)
MONO#: 1.1 10*3/uL — ABNORMAL HIGH (ref 0.1–0.9)
MONO%: 18.5 % — ABNORMAL HIGH (ref 0.0–14.0)
NEUT#: 1.9 10*3/uL (ref 1.5–6.5)
NEUT%: 31.8 % — AB (ref 38.4–76.8)
PLATELETS: 192 10*3/uL (ref 145–400)
RBC: 3.53 10*6/uL — ABNORMAL LOW (ref 3.70–5.45)
RDW: 17.4 % — ABNORMAL HIGH (ref 11.2–14.5)
WBC: 5.8 10*3/uL (ref 3.9–10.3)

## 2017-06-09 MED ORDER — ONDANSETRON HCL 8 MG PO TABS: 8.0000 mg | ORAL_TABLET | Freq: Once | ORAL | Status: DC

## 2017-06-09 MED ORDER — ONDANSETRON HCL 8 MG PO TABS
ORAL_TABLET | ORAL | Status: AC
  Filled 2017-06-09: qty 1

## 2017-06-09 NOTE — Progress Notes (Signed)
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 Today is day 8 cycle 4 of 6 planned cycles of chemoimmunotherapy, given every 3 weeks, to be followed by surgery then radiation.  She is not having a very good week.  She received her treatment and tolerated it well, however, she has had increasing RUQ pain over the past week.  When she experiences it, it is a 6-7/10 and radiates posteriorly through her abdomen and to her right shoulder blade.  She says this pain will wake her up in the middle of the night.  She says she is nauseated when she experiences this pain and sitting up makes it worse.  She also notes that she has had a more difficult time with nausea after this cycle of treatment.  She is taking Nexium daily and does not fee like this is her typical reflux.      REVIEW OF SYSTEMS: Maria Hester denies fevers, chills, vomiting, diarrhea, headaches, peripheral neuropathy, or any further concerns.  A detailed ROS was non contributory other than what is noted in the interval history.    BREAST CANCER HISTORY: From the original intake note:  Maria Hester herself noted a change in her left breast when looking into a mirror and then palpating a mass in the upper-outer quadrant. She  brought this to medical attention and on 02/24/2017 underwent bilateral diagnostic mammography with tomography and left breast ultrasonography at the breast Center. The breast density was category C. In the upper portion of the left breast there was a broad area of distortion measuring up to 7.5 cm. On exam there was a broad firm area involving the upper outer quadrant of the left breast which was visibly protruding. By ultrasound at the 11:00 axis 8 cm from the nipple there was an irregular hypoechoic mass measuring 1.4 cm with additiona ases at 2:00, 3 cm from the nipple and multiple other masses as described, all in the upper-outer quadrant primarily. Ultrasound of the left axilla showed at least 3 prominent lymph nodes one of which had a cortical bulge.  On 03/02/2017 the patient underwent biopsy of a left breast mass at the 11:30 o'clock position and a second mass described as upper outer quadrant as well as one of the suspicious left axillary lymph nodes. These all showed invasive ductal carcinoma, grade 3. Prognostic panel from one of the 2 masses showed the tumor to be estrogen and progesterone receptor negative, but HER-2 amplified, with a signals ratio of 8.24 and the number per cell 15.65.  Her subsequent history is as detailed below   PAST MEDICAL HISTORY: Past Medical History:  Diagnosis Date  . Breast cancer ([place])   . 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 Complication of anesthesia    pt states BP tends to drop and has a hard time waking up -  pt was told by prior anesthesiologist to make sure this was made known for furture surgeries  . Family history of breast cancer   . Uterine fibroid     PAST SURGICAL HISTORY: Past Surgical History:  Procedure Laterality Date  . ABDOMINAL HYSTERECTOMY    . ABDOMINOPLASTY N/A 02/28/2014   Procedure: ABDOMINOPLASTY with Panniculectomy;  Surgeon: [name], MD;  Location: [facility];   Service: Gynecology;  Laterality: N/A;  . APPENDECTOMY    . BILATERAL SALPINGECTOMY Bilateral 02/28/2014   Procedure: BILATERAL SALPINGECTOMY;  Surgeon: [name], MD;  Location: [facility];  Service: Gynecology;  Laterality: Bilateral;  . CATARACT EXTRACTION, BILATERAL    . PORTACATH PLACEMENT Right 03/18/2017   Procedure: INSERTION PORT-A-CATH;  Surgeon: Excell Seltzer, MD;  Location: [facility];  Service: General;  Laterality: Right;  . reverse tubal ligation    . SCAR REVISION N/A 02/28/2014   Procedure: SCAR REVISION;  Surgeon: [name], MD;  Location: [facility];  Service: Gynecology;  Laterality: N/A;  . SUPRACERVICAL ABDOMINAL HYSTERECTOMY N/A 02/28/2014   Procedure: HYSTERECTOMY SUPRACERVICAL ABDOMINAL;  Surgeon: [name], MD;  Location: [facility];  Service: Gynecology;  Laterality: N/A;  . TUBAL LIGATION    . WISDOM TOOTH EXTRACTION      FAMILY HISTORY Family History  Problem Relation Age of Onset  . Heart disease Mother   . Hypertension Mother   . Diabetes Father   . Heart disease Father   . Heart disease Maternal Grandmother   . Colon cancer Maternal Grandmother 58       died in her 11's  . Heart disease Maternal Grandfather   . Esophageal cancer Maternal Grandfather 50       died at 71  . Diabetes Paternal Grandmother   . Heart disease Paternal Grandmother   . Kidney cancer Paternal Grandmother 60       died in her 14's  . Tuberculosis Paternal Grandfather   . Heart disease Paternal Grandfather   . Skin cancer Paternal Grandfather 71       died at 69, patient not sure if it was melanoma, BCC, Squamous, etc.  . Breast cancer Maternal Aunt 50       died at 85  . Breast cancer Cousin 13       she is now in her 58's  The patient's father died from heart disease at age 51. The patient's mother is living at age 59. The patient has one brother, 2 sisters. On the mother's side and aunt had breast cancer at age 56 and her daughter had breast cancer in her early 42s.  There is also a grandfather with esophageal cancer and a grandmother with colon cancer. On the paternal side there is a grandmother with kidney cancer at an early age and a grandfather with melanoma at an early age.  GYNECOLOGIC HISTORY:  Patient's last menstrual period was 02/11/2014.  Menarche age 79, first live birth age 10, the patient is Maria Hester. She underwent hysterectomy without salpingo-oophorectomy May 2016. She did not use hormone replacement. She did use oral contraceptives approximately 10 years in the 1980s.  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 Her husband Fish farm manager in the same [place], which is a 10% aviation enrollment. Daughter Maria Hester is a hairstylist in [place] and son Maria Hester is a Physiological scientist in [place] the patient has 2 grandsons and one granddaughter. She attends a [employer]  ADVANCED DIRECTIVES: The patient has completed advanced directives and will have the motorized at the next visit   HEALTH MAINTENANCE: Social History  Substance Use Topics  . Smoking status: Former Smoker    Packs/day: 0.50    Years: 34.00    Types: Cigarettes  . Smokeless tobacco: Never Used  . Alcohol use No     Comment: social drink      Colonoscopy:No  PAP: 03/04/2017  Bone density: No   No Known Allergies  Current Outpatient Prescriptions  Medication Sig Dispense Refill  . ALPRAZolam (XANAX) 0.5 MG tablet TAKE HALF TO 1 TABLET BY MOUTH EVERY 8 HOURS AS NEEDED FOR ANXIETY  2  . cholestyramine (QUESTRAN) 4 GM/DOSE powder Take 1 packet (4 g total) by mouth 2 (two) times daily. 378 g 12  . dexamethasone (DECADRON) 4 MG tablet TAKE 2 TABLETS BY MOUTH TWICE DAILY. START DAY BEFORE TAZOTERE THEN AGAIN THE DAY AFTER CHEMO 30 tablet 1  . esomeprazole (NEXIUM) 40 MG capsule Take 40 mg by mouth daily at 12 noon.    . lidocaine-prilocaine (EMLA) cream Apply to affected area once 30 g 3  .  LORazepam (ATIVAN) 0.5 MG tablet TAKE 1 TABLET BY MOUTH AT BEDTIME AS NEEDED FOR NAUSEA AND VOMITING 30 tablet 0  . metroNIDAZOLE (METROGEL) 1 % gel Apply topically daily. 45 g 0  . ondansetron (ZOFRAN) 8 MG tablet Take 1 tablet (8 mg total) by mouth every 8 (eight) hours as needed for nausea or vomiting. 30 tablet 3  . prochlorperazine (COMPAZINE) 10 MG tablet TAKE 1 TABLET BY MOUTH EVERY 6 HOURS AS NEEDED FOR NAUSEA AND VOMITING 30 tablet 1   No current facility-administered medications for this visit.    Facility-Administered Medications Ordered in Other Visits  Medication Dose Route Frequency Provider Last Rate Last Dose  . heparin lock flush 100 unit/mL  500 Units Intracatheter Once Magrinat, Virgie Dad, MD      . sodium chloride flush (NS) 0.9 % injection 10 mL  10 mL Intracatheter Once Magrinat, Virgie Dad, MD        OBJECTIVE:  Vitals:   06/09/17 1132  BP: (!) 113/56  Pulse: 85  Resp: 18  Temp: 97.9 F (36.6 C)  SpO2: 100%     Body mass index is 31.05 kg/m.    ECOG FS:1 - Symptomatic but completely ambulatory GENERAL: Patient is a well appearing female in no acute distress HEENT:  Sclerae anicteric.  Oropharynx clear and moist. No ulcerations or evidence of oropharyngeal candidiasis. Neck is supple.  NODES:  No cervical, supraclavicular, or axillary lymphadenopathy palpated.  BREAST EXAM:  Deferred. LUNGS:  Clear to auscultation bilaterally.  No wheezes or rhonchi. HEART:  Regular rate and rhythm. No murmur appreciated. ABDOMEN:  Soft, mildly + murphy sign  Positive, normoactive bowel sounds. No organomegaly palpated. MSK:  No focal spinal tenderness to palpation. Full range of motion bilaterally in the upper extremities. EXTREMITIES:  No peripheral edema.   SKIN:  Clear with no obvious rashes or skin changes. No nail dyscrasia. NEURO:  Nonfocal. Well oriented.  Appropriate affect.    LAB RESULTS:  CMP     Component Value Date/Time   NA 140 06/02/2017 0827   K 4.1  06/02/2017 0827   CL 106 03/01/2014 0607   CO2 23 06/02/2017 0827   GLUCOSE 163 (H) 06/02/2017 0827   BUN 16.1 06/02/2017 0827   CREATININE 0.8 06/02/2017 0827   CALCIUM 9.9 06/02/2017 0827   PROT 6.7 06/02/2017  0827   ALBUMIN 3.4 (L) 06/02/2017 0827   AST 17 06/02/2017 0827   ALT 27 06/02/2017 0827   ALKPHOS 79 06/02/2017 0827   BILITOT 0.27 06/02/2017 0827   GFRNONAA >90 03/01/2014 0607   GFRAA >90 03/01/2014 0607    No results found for: TOTALPROTELP, ALBUMINELP, A1GS, A2GS, BETS, BETA2SER, GAMS, MSPIKE, SPEI  No results found for: Nils Pyle, [facility]  Lab Results  Component Value Date   WBC 5.8 06/09/2017   NEUTROABS 1.9 06/09/2017   HGB 10.7 (L) 06/09/2017   HCT 33.3 (L) 06/09/2017   MCV 94.3 06/09/2017   PLT 192 06/09/2017      Chemistry      Component Value Date/Time   NA 140 06/02/2017 0827   K 4.1 06/02/2017 0827   CL 106 03/01/2014 0607   CO2 23 06/02/2017 0827   BUN 16.1 06/02/2017 0827   CREATININE 0.8 06/02/2017 0827      Component Value Date/Time   CALCIUM 9.9 06/02/2017 0827   ALKPHOS 79 06/02/2017 0827   AST 17 06/02/2017 0827   ALT 27 06/02/2017 0827   BILITOT 0.27 06/02/2017 0827       No results found for: LABCA2  No components found for: [id number]  No results for input(s): INR in the last 168 hours.  Urinalysis    Component Value Date/Time   COLORURINE YELLOW 02/23/2014 1004   APPEARANCEUR CLEAR 02/23/2014 1004   LABSPEC 1.025 02/23/2014 1004   PHURINE 5.5 02/23/2014 1004   GLUCOSEU NEGATIVE 02/23/2014 1004   HGBUR NEGATIVE 02/23/2014 1004   BILIRUBINUR NEGATIVE 02/23/2014 1004   KETONESUR NEGATIVE 02/23/2014 1004   PROTEINUR NEGATIVE 02/23/2014 1004   UROBILINOGEN 0.2 02/23/2014 1004   NITRITE NEGATIVE 02/23/2014 1004   LEUKOCYTESUR NEGATIVE 02/23/2014 1004     STUDIES: No results found.  ELIGIBLE FOR AVAILABLE RESEARCH PROTOCOL: no  ASSESSMENT: 51 y.o. [place], [place] woman status post left breast  overlapping sites biopsy 03/02/2017 for a clinical T3 N1-2, stage IIIA invasive ductal carcinoma, grade 3, estrogen and progesterone receptor negative, HER-2 amplified, with an MIB-1 of 35%  (1) genetics testing07/24/2018 through the Hereditary Gene Panel offered by [facility] found no deleterious mutations in APC, ATM, AXIN2, BARD1, BMPR1A, BRCA1, BRCA2, BRIP1, CDH1, CDKN2A (p14ARF), CDKN2A (p16INK4a), CHEK2, CTNNA1, DICER1, EPCAM (Deletion/duplication testing only), GREM1 (promoter region deletion/duplication testing only), KIT, MEN1, MLH1, MSH2, MSH3, MSH6, MUTYH, NBN, NF1, NHTL1, PALB2, PDGFRA, PMS2, POLD1, POLE, PTEN, RAD50, RAD51C, RAD51D, SDHB, SDHC, SDHD, SMAD4, SMARCA4. STK11, TP53, TSC1, TSC2, and VHL.  The following genes were evaluated for sequence changes only: SDHA and HOXB13 c.251G>A variant only.  (2) neoadjuvant chemotherapy consisting of carboplatin and Docetaxel given with trastuzumab and Pertuzumab every 21 days for 6 cycles, starting 03/31/2017  (a) Pertuzumab stopped after cycle 1 secondary to side effects     (b) Pertuzumab resumed cycle 3, not at loading dose.  (3) trastuzumab will be continued to complete 6 months  (a) echocardiogram 03/20/2017 showed an ejection fraction of 55%   (4) definitive surgery to follow chemotherapy  (5) adjuvant radiation to follow surgery   PLAN:  Maria Hester is doing fair today.  Her CBC is normal for the most part, and that is a good thing.  I am concerned about this abdominal pain.  I reviewed Diesha with Dr. Jana Hakim.  We will get an xray of the abdomen, along with a RUQ ultrasound.  I will also add an amylase and lipase to her labs today.  We will await those results,  and may need to do further testing depending on those results.  I reviewed with Maria Hester the plan, and reviewed with her reasons to go to ER for abdominal pain.    Maria Hester has appts for labs, f/u with me, and her fifth cycle of chemotherapy on 06/23/2017.  She knows to call between now  and her next appt if she has any questions or concerns.    Maria Phlegm, NP 04/22/2017 5:56 PM Medical Oncology and Hematology [facility] [address] [place], [place] [address] Tel. [phone]    Fax. [phone]   ADDENDUM: Maria Hester has abdominal pain is a little puzzling. It certainly would be typical of cholecystitis except that it doesn't seem to be related to fatty foods. It is not clearly positional or brought on by any particular activity. Does not occur related to bowel movements.  I think the best place to start is to obtain an ultrasound of the abdomen. We will do that today possible. Hopefully this will not delay her next chemotherapy, which will be on 06/23/2017  She knows to call for any other issues that may develop before the next visit here.  I personally saw this patient and performed a substantive portion of this encounter with the listed APP documented above.   Chauncey Cruel, MD Medical Oncology and Hematology [facility] [address] [place], [place] [address] Tel. [phone]    Fax. [phone]

## 2017-06-10 ENCOUNTER — Telehealth: Payer: Self-pay

## 2017-06-10 LAB — AMYLASE: AMYLASE: 58 U/L (ref 31–124)

## 2017-06-10 LAB — LIPASE: Lipase: 23 U/L (ref 14–72)

## 2017-06-10 NOTE — Telephone Encounter (Signed)
Left VM to call back.  Need to give lab results.

## 2017-06-11 ENCOUNTER — Telehealth: Payer: Self-pay

## 2017-06-11 ENCOUNTER — Telehealth: Payer: Self-pay | Admitting: Oncology

## 2017-06-11 NOTE — Telephone Encounter (Signed)
-----   Message from Gardenia Phlegm, NP sent at 06/10/2017  8:48 AM EDT ----- Labs are normal ----- Message ----- From: Interface, Lab In Three Zero One Sent: 06/10/2017   8:44 AM To: Gardenia Phlegm, NP

## 2017-06-11 NOTE — Telephone Encounter (Signed)
Have called twice and left message to call center back for lab results (8/29 and 8/30).

## 2017-06-11 NOTE — Telephone Encounter (Signed)
Called patient and left a voicemail to advise that disability forms have been completed and faxed to insurance company

## 2017-06-11 NOTE — Telephone Encounter (Signed)
Called patient to let her know that lab results were normal.  She also asked about some critical illness papers that needed to be faxed.  This nurse contacted Bryson Corona for patient to see if paperwork had been faxed and she said yes.  Relayed this info to patient.

## 2017-06-12 ENCOUNTER — Ambulatory Visit (HOSPITAL_COMMUNITY)
Admission: RE | Admit: 2017-06-12 | Discharge: 2017-06-12 | Disposition: A | Discharge status: Home / Self Care | Payer: 59 | Source: Ambulatory Visit | Attending: Adult Health | Admitting: Adult Health

## 2017-06-12 DIAGNOSIS — K824 Cholesterolosis of gallbladder: Secondary | ICD-10-CM | POA: Diagnosis not present

## 2017-06-12 DIAGNOSIS — R1011 Right upper quadrant pain: Secondary | ICD-10-CM | POA: Insufficient documentation

## 2017-06-16 ENCOUNTER — Telehealth: Payer: Self-pay

## 2017-06-16 NOTE — Telephone Encounter (Signed)
Called pt 09/04.  She states still having "anxious stomach, not nausea, 24/7".  Pt states nausea medicine is not working.  Had episode "the other night" waking up with bad pain that made her vomit.  Also says having difficulty swallowing.  She feels like food gets stuck "at bottom of my esophagus".  Please advise.

## 2017-06-16 NOTE — Telephone Encounter (Signed)
Pt called requesting results of US done last Friday.

## 2017-06-16 NOTE — Telephone Encounter (Signed)
Horris Latino,  I sent Val a message about this on Friday.  Can you see if you can find it?    Thanks,  [name]

## 2017-06-18 ENCOUNTER — Other Ambulatory Visit: Payer: Self-pay | Admitting: Adult Health

## 2017-06-18 DIAGNOSIS — R0789 Other chest pain: Secondary | ICD-10-CM

## 2017-06-18 DIAGNOSIS — Z171 Estrogen receptor negative status [ER-]: Principal | ICD-10-CM

## 2017-06-18 DIAGNOSIS — C50812 Malignant neoplasm of overlapping sites of left female breast: Secondary | ICD-10-CM

## 2017-06-18 NOTE — Telephone Encounter (Signed)
I called her this morning.  We ordered CT chest, once we have those results we will decide whether or not she needs to see GI or surgery.  Thanks you!

## 2017-06-18 NOTE — Telephone Encounter (Signed)
[name],  Did you need me to replace a referral for this patient or are you taking care of it? Thanks.

## 2017-06-23 ENCOUNTER — Telehealth: Payer: Self-pay

## 2017-06-23 ENCOUNTER — Ambulatory Visit (HOSPITAL_BASED_OUTPATIENT_CLINIC_OR_DEPARTMENT_OTHER): Payer: 59 | Admitting: Adult Health

## 2017-06-23 ENCOUNTER — Other Ambulatory Visit (HOSPITAL_BASED_OUTPATIENT_CLINIC_OR_DEPARTMENT_OTHER): Payer: 59

## 2017-06-23 ENCOUNTER — Ambulatory Visit: Payer: 59

## 2017-06-23 ENCOUNTER — Encounter: Payer: Self-pay | Admitting: *Deleted

## 2017-06-23 ENCOUNTER — Ambulatory Visit (HOSPITAL_BASED_OUTPATIENT_CLINIC_OR_DEPARTMENT_OTHER): Payer: 59

## 2017-06-23 VITALS — BP 112/54 | HR 71 | Temp 97.8°F | Resp 18 | Ht 65.5 in | Wt 192.3 lb

## 2017-06-23 DIAGNOSIS — Z171 Estrogen receptor negative status [ER-]: Secondary | ICD-10-CM

## 2017-06-23 DIAGNOSIS — C50812 Malignant neoplasm of overlapping sites of left female breast: Secondary | ICD-10-CM

## 2017-06-23 DIAGNOSIS — Z5111 Encounter for antineoplastic chemotherapy: Secondary | ICD-10-CM | POA: Diagnosis not present

## 2017-06-23 DIAGNOSIS — Z5112 Encounter for antineoplastic immunotherapy: Secondary | ICD-10-CM | POA: Diagnosis not present

## 2017-06-23 DIAGNOSIS — Z5189 Encounter for other specified aftercare: Secondary | ICD-10-CM

## 2017-06-23 DIAGNOSIS — Z95828 Presence of other vascular implants and grafts: Secondary | ICD-10-CM

## 2017-06-23 DIAGNOSIS — R1011 Right upper quadrant pain: Secondary | ICD-10-CM | POA: Diagnosis not present

## 2017-06-23 LAB — CBC WITH DIFFERENTIAL/PLATELET
BASO%: 0 % (ref 0.0–2.0)
Basophils Absolute: 0 10*3/uL (ref 0.0–0.1)
EOS ABS: 0 10*3/uL (ref 0.0–0.5)
EOS%: 0 % (ref 0.0–7.0)
HEMATOCRIT: 32.5 % — AB (ref 34.8–46.6)
HGB: 10.4 g/dL — ABNORMAL LOW (ref 11.6–15.9)
LYMPH#: 1.9 10*3/uL (ref 0.9–3.3)
LYMPH%: 16.4 % (ref 14.0–49.7)
MCH: 30.5 pg (ref 25.1–34.0)
MCHC: 32 g/dL (ref 31.5–36.0)
MCV: 95.3 fL (ref 79.5–101.0)
MONO#: 0.7 10*3/uL (ref 0.1–0.9)
MONO%: 6.5 % (ref 0.0–14.0)
NEUT#: 8.8 10*3/uL — ABNORMAL HIGH (ref 1.5–6.5)
NEUT%: 77.1 % — ABNORMAL HIGH (ref 38.4–76.8)
PLATELETS: 360 10*3/uL (ref 145–400)
RBC: 3.41 10*6/uL — ABNORMAL LOW (ref 3.70–5.45)
RDW: 18.1 % — ABNORMAL HIGH (ref 11.2–14.5)
WBC: 11.4 10*3/uL — ABNORMAL HIGH (ref 3.9–10.3)

## 2017-06-23 LAB — COMPREHENSIVE METABOLIC PANEL
ALK PHOS: 77 U/L (ref 40–150)
ALT: 21 U/L (ref 0–55)
ANION GAP: 11 meq/L (ref 3–11)
AST: 17 U/L (ref 5–34)
Albumin: 3.4 g/dL — ABNORMAL LOW (ref 3.5–5.0)
BILIRUBIN TOTAL: 0.26 mg/dL (ref 0.20–1.20)
BUN: 16.4 mg/dL (ref 7.0–26.0)
CALCIUM: 9.7 mg/dL (ref 8.4–10.4)
CO2: 21 mEq/L — ABNORMAL LOW (ref 22–29)
Chloride: 106 mEq/L (ref 98–109)
Creatinine: 0.7 mg/dL (ref 0.6–1.1)
Glucose: 124 mg/dl (ref 70–140)
Potassium: 4.2 mEq/L (ref 3.5–5.1)
Sodium: 139 mEq/L (ref 136–145)
TOTAL PROTEIN: 6.7 g/dL (ref 6.4–8.3)

## 2017-06-23 MED ORDER — DIPHENHYDRAMINE HCL 25 MG PO CAPS
ORAL_CAPSULE | ORAL | Status: AC
  Filled 2017-06-23: qty 1

## 2017-06-23 MED ORDER — PALONOSETRON HCL INJECTION 0.25 MG/5ML
0.2500 mg | Freq: Once | INTRAVENOUS | Status: AC
  Administered 2017-06-23: 0.25 mg via INTRAVENOUS

## 2017-06-23 MED ORDER — DEXAMETHASONE SODIUM PHOSPHATE 10 MG/ML IJ SOLN
INTRAMUSCULAR | Status: AC
  Filled 2017-06-23: qty 1

## 2017-06-23 MED ORDER — DIPHENHYDRAMINE HCL 25 MG PO CAPS
25.0000 mg | ORAL_CAPSULE | Freq: Once | ORAL | Status: AC
  Administered 2017-06-23: 25 mg via ORAL

## 2017-06-23 MED ORDER — SODIUM CHLORIDE 0.9 % IV SOLN
420.0000 mg | Freq: Once | INTRAVENOUS | Status: AC
  Administered 2017-06-23: 420 mg via INTRAVENOUS
  Filled 2017-06-23: qty 14

## 2017-06-23 MED ORDER — SODIUM CHLORIDE 0.9% FLUSH
10.0000 mL | Freq: Once | INTRAVENOUS | Status: AC
  Administered 2017-06-23: 10 mL
  Filled 2017-06-23: qty 10

## 2017-06-23 MED ORDER — ACETAMINOPHEN 325 MG PO TABS
ORAL_TABLET | ORAL | Status: AC
  Filled 2017-06-23: qty 2

## 2017-06-23 MED ORDER — PEGFILGRASTIM 6 MG/0.6ML ~~LOC~~ PSKT
6.0000 mg | PREFILLED_SYRINGE | Freq: Once | SUBCUTANEOUS | Status: AC
  Administered 2017-06-23: 6 mg via SUBCUTANEOUS
  Filled 2017-06-23: qty 0.6

## 2017-06-23 MED ORDER — SODIUM CHLORIDE 0.9% FLUSH
10.0000 mL | INTRAVENOUS | Status: DC | PRN
  Administered 2017-06-23: 10 mL
  Filled 2017-06-23: qty 10

## 2017-06-23 MED ORDER — TRASTUZUMAB CHEMO 150 MG IV SOLR
6.0000 mg/kg | Freq: Once | INTRAVENOUS | Status: AC
  Administered 2017-06-23: 525 mg via INTRAVENOUS
  Filled 2017-06-23: qty 25

## 2017-06-23 MED ORDER — HEPARIN SOD (PORK) LOCK FLUSH 100 UNIT/ML IV SOLN
500.0000 [IU] | Freq: Once | INTRAVENOUS | Status: AC | PRN
  Administered 2017-06-23: 500 [IU]
  Filled 2017-06-23: qty 5

## 2017-06-23 MED ORDER — DEXAMETHASONE SODIUM PHOSPHATE 10 MG/ML IJ SOLN
10.0000 mg | Freq: Once | INTRAMUSCULAR | Status: AC
  Administered 2017-06-23: 10 mg via INTRAVENOUS

## 2017-06-23 MED ORDER — SODIUM CHLORIDE 0.9 % IV SOLN
Freq: Once | INTRAVENOUS | Status: AC
  Administered 2017-06-23: 10:00:00 via INTRAVENOUS

## 2017-06-23 MED ORDER — DOCETAXEL CHEMO INJECTION 160 MG/16ML
75.0000 mg/m2 | Freq: Once | INTRAVENOUS | Status: AC
  Administered 2017-06-23: 150 mg via INTRAVENOUS
  Filled 2017-06-23: qty 15

## 2017-06-23 MED ORDER — CARBOPLATIN CHEMO INJECTION 600 MG/60ML
702.5000 mg | Freq: Once | INTRAVENOUS | Status: AC
  Administered 2017-06-23: 700 mg via INTRAVENOUS
  Filled 2017-06-23: qty 70

## 2017-06-23 MED ORDER — PALONOSETRON HCL INJECTION 0.25 MG/5ML
INTRAVENOUS | Status: AC
  Filled 2017-06-23: qty 5

## 2017-06-23 MED ORDER — ACETAMINOPHEN 325 MG PO TABS
650.0000 mg | ORAL_TABLET | Freq: Once | ORAL | Status: AC
  Administered 2017-06-23: 650 mg via ORAL

## 2017-06-23 NOTE — Patient Instructions (Signed)
[facility] Discharge Instructions for Patients Receiving Chemotherapy  Today you received the following chemotherapy agents Taxotere, carboplatin,Herceptin and Perjeta  To help prevent nausea and vomiting after your treatment, we encourage you to take your nausea medication as directed   If you develop nausea and vomiting that is not controlled by your nausea medication, call the clinic.   BELOW ARE SYMPTOMS THAT SHOULD BE REPORTED IMMEDIATELY:  *FEVER GREATER THAN 100.5 F  *CHILLS WITH OR WITHOUT FEVER  NAUSEA AND VOMITING THAT IS NOT CONTROLLED WITH YOUR NAUSEA MEDICATION  *UNUSUAL SHORTNESS OF BREATH  *UNUSUAL BRUISING OR BLEEDING  TENDERNESS IN MOUTH AND THROAT WITH OR WITHOUT PRESENCE OF ULCERS  *URINARY PROBLEMS  *BOWEL PROBLEMS  UNUSUAL RASH Items with * indicate a potential emergency and should be followed up as soon as possible.  Feel free to call the clinic you have any questions or concerns. The clinic phone number is [phone].  Please show the [facility] at check-in to the Emergency Department and triage nurse.

## 2017-06-23 NOTE — Progress Notes (Signed)
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 Today is day 1 cycle 5 of 6 planned cycles of chemoimmunotherapy, given every 3 weeks, to be followed by surgery then radiation.  She continues to have some mild RUQ pain.  It is improved from previous.  It was associated with a nervous stomach, however recently it has just been some pain at night of about a 6/10.    REVIEW OF SYSTEMS: Maria Hester denies any other issues today.  She denies fevers, chills, nausea, vomiting, constipation, diarrhea, mucositis, peripheral neuropathy, nail changes.  A detailed  ROS was otherwise non contributory.  BREAST CANCER HISTORY: From the original intake note:  Maria Hester herself noted a change in her left breast when looking into a mirror and then palpating a mass in the upper-outer quadrant. She brought this to medical attention and on 02/24/2017 underwent bilateral diagnostic mammography with tomography and left breast ultrasonography at the breast Center. The breast density was category C. In the upper portion of the left breast there was a broad area of distortion measuring up to 7.5 cm. On exam there was a broad firm area involving the upper outer quadrant of the left breast which was visibly  protruding. By ultrasound at the 11:00 axis 8 cm from the nipple there was an irregular hypoechoic mass measuring 1.4 cm with additiona ases at 2:00, 3 cm from the nipple and multiple other masses as described, all in the upper-outer quadrant primarily. Ultrasound of the left axilla showed at least 3 prominent lymph nodes one of which had a cortical bulge.  On 03/02/2017 the patient underwent biopsy of a left breast mass at the 11:30 o'clock position and a second mass described as upper outer quadrant as well as one of the suspicious left axillary lymph nodes. These all showed invasive ductal carcinoma, grade 3. Prognostic panel from one of the 2 masses showed the tumor to be estrogen and progesterone receptor negative, but HER-2 amplified, with a signals ratio of 8.24 and the number per cell 15.65.  Her subsequent history is as detailed below   PAST MEDICAL HISTORY: Past Medical History:  Diagnosis Date  . Breast cancer ([place])   . 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 Complication of anesthesia    pt states BP tends to drop and has a hard time waking up - pt was told by prior anesthesiologist to make sure this was made known for furture surgeries  . Family history of breast cancer   . Uterine fibroid     PAST SURGICAL HISTORY: Past Surgical History:  Procedure Laterality Date  . ABDOMINAL HYSTERECTOMY    . ABDOMINOPLASTY N/A 02/28/2014   Procedure: ABDOMINOPLASTY with Panniculectomy;  Surgeon: [name], MD;  Location: [facility];  Service: Gynecology;  Laterality: N/A;  . APPENDECTOMY    . BILATERAL SALPINGECTOMY  Bilateral 02/28/2014   Procedure: BILATERAL SALPINGECTOMY;  Surgeon: [name], MD;  Location: [facility];  Service: Gynecology;  Laterality: Bilateral;  . CATARACT EXTRACTION, BILATERAL    . PORTACATH PLACEMENT Right 03/18/2017   Procedure: INSERTION PORT-A-CATH;  Surgeon: Excell Seltzer, MD;  Location: [facility];   Service: General;  Laterality: Right;  . reverse tubal ligation    . SCAR REVISION N/A 02/28/2014   Procedure: SCAR REVISION;  Surgeon: [name], MD;  Location: [facility];  Service: Gynecology;  Laterality: N/A;  . SUPRACERVICAL ABDOMINAL HYSTERECTOMY N/A 02/28/2014   Procedure: HYSTERECTOMY SUPRACERVICAL ABDOMINAL;  Surgeon: [name], MD;  Location: [facility];  Service: Gynecology;  Laterality: N/A;  . TUBAL LIGATION    . WISDOM TOOTH EXTRACTION      FAMILY HISTORY Family History  Problem Relation Age of Onset  . Heart disease Mother   . Hypertension Mother   . Diabetes Father   . Heart disease Father   . Heart disease Maternal Grandmother   . Colon cancer Maternal Grandmother 76       died in her 65's  . Heart disease Maternal Grandfather   . Esophageal cancer Maternal Grandfather 50       died at 89  . Diabetes Paternal Grandmother   . Heart disease Paternal Grandmother   . Kidney cancer Paternal Grandmother 22       died in her 35's  . Tuberculosis Paternal Grandfather   . Heart disease Paternal Grandfather   . Skin cancer Paternal Grandfather 66       died at 70, patient not sure if it was melanoma, BCC, Squamous, etc.  . Breast cancer Maternal Aunt 50       died at 91  . Breast cancer Cousin 29       she is now in her 55's  The patient's father died from heart disease at age 56. The patient's mother is living at age 64. The patient has one brother, 2 sisters. On the mother's side and aunt had breast cancer at age 80 and her daughter had breast cancer in her early 44s. There is also a grandfather with esophageal cancer and a grandmother with colon cancer. On the paternal side there is a grandmother with kidney cancer at an early age and a grandfather with melanoma at an early age.  GYNECOLOGIC HISTORY:  Patient's last menstrual period was 02/11/2014.  Menarche age 80, first live birth age 36, the patient is Maria Hester P2. She underwent hysterectomy without  salpingo-oophorectomy May 2016. She did not use hormone replacement. She did use oral contraceptives approximately 10 years in the 1980s.  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 Her husband Fish farm manager in the same [place], which is a 10% aviation enrollment. Daughter Maria Hester is a hairstylist in [place] and son Maria Hester is a Physiological scientist in [place] the patient has 2 grandsons and one granddaughter. She attends a [employer]    ADVANCED DIRECTIVES: The patient has completed advanced directives and will have the motorized at the next visit   HEALTH MAINTENANCE: Social History  Substance Use Topics  . Smoking status: Former Smoker    Packs/day: 0.50    Years: 34.00    Types: Cigarettes  . Smokeless tobacco: Never Used  . Alcohol use No     Comment: social drink      Colonoscopy:No  PAP: 03/04/2017  Bone density: No  No Known Allergies  Current Outpatient Prescriptions  Medication Sig Dispense Refill  . ALPRAZolam (XANAX) 0.5 MG tablet TAKE HALF TO 1 TABLET BY MOUTH EVERY 8 HOURS AS NEEDED FOR ANXIETY  2  . cholestyramine (QUESTRAN) 4 GM/DOSE powder Take 1 packet (4 g total) by mouth 2 (two) times daily. 378 g 12  . dexamethasone (DECADRON) 4 MG tablet TAKE 2 TABLETS BY MOUTH TWICE DAILY. START DAY BEFORE TAZOTERE THEN AGAIN THE DAY AFTER CHEMO 30 tablet 1  . esomeprazole (NEXIUM) 40 MG capsule Take 40 mg by mouth daily at 12 noon.    . lidocaine-prilocaine (EMLA) cream Apply to affected area once 30 g 3  . LORazepam (ATIVAN) 0.5 MG tablet TAKE 1 TABLET BY MOUTH AT BEDTIME AS NEEDED FOR NAUSEA AND VOMITING 30 tablet 0  . metroNIDAZOLE (METROGEL) 1 % gel Apply topically daily. 45 g 0  . ondansetron (ZOFRAN) 8 MG tablet Take 1 tablet (8 mg total) by mouth every 8 (eight) hours as needed for nausea or vomiting. 30 tablet 3  . prochlorperazine (COMPAZINE) 10 MG tablet TAKE 1 TABLET BY MOUTH  EVERY 6 HOURS AS NEEDED FOR NAUSEA AND VOMITING 30 tablet 1   No current facility-administered medications for this visit.    Facility-Administered Medications Ordered in Other Visits  Medication Dose Route Frequency Provider Last Rate Last Dose  . heparin lock flush 100 unit/mL  500 Units Intracatheter Once Magrinat, Maria Dad, MD      . sodium chloride flush (NS) 0.9 % injection 10 mL  10 mL Intracatheter Once Magrinat, Maria Dad, MD        OBJECTIVE:  Vitals:   06/23/17 0902  BP: (!) 112/54  Pulse: 71  Resp: 18  Temp: 97.8 F (36.6 C)  SpO2: 99%     Body mass index is 31.51 kg/m.    ECOG FS:1 - Symptomatic but completely ambulatory GENERAL: Patient is a well appearing female in no acute distress HEENT:  Sclerae anicteric.  Oropharynx clear and moist. No ulcerations or evidence of oropharyngeal candidiasis. Neck is supple.  NODES:  No cervical, supraclavicular, or axillary lymphadenopathy palpated.  BREAST EXAM:  Deferred. LUNGS:  Clear to auscultation bilaterally.  No wheezes or rhonchi. HEART:  Regular rate and rhythm. No murmur appreciated. ABDOMEN:  Soft, ND. + mild RUQ tenderness  Positive, normoactive bowel sounds. No organomegaly palpated. MSK:  No focal spinal tenderness to palpation. Full range of motion bilaterally in the upper extremities. EXTREMITIES:  No peripheral edema.   SKIN:  Clear with no obvious rashes or skin changes. No nail dyscrasia. NEURO:  Nonfocal. Well oriented.  Appropriate affect.    LAB RESULTS:  CMP     Component Value Date/Time   NA 140 06/09/2017 1110   K 4.2 06/09/2017 1110   CL 106 03/01/2014 0607   CO2 29 06/09/2017 1110   GLUCOSE 104 06/09/2017 1110   BUN 15.7 06/09/2017 1110   CREATININE 0.8 06/09/2017 1110   CALCIUM 9.3 06/09/2017 1110   PROT 6.3 (L) 06/09/2017 1110   ALBUMIN 3.4 (L) 06/09/2017 1110   AST 13 06/09/2017 1110   ALT 17 06/09/2017 1110   ALKPHOS 98 06/09/2017 1110   BILITOT 0.31 06/09/2017 1110   GFRNONAA >90  03/01/2014 0607   GFRAA >90 03/01/2014 0607    No results found for: TOTALPROTELP, ALBUMINELP, A1GS, A2GS, BETS, BETA2SER, GAMS, MSPIKE, SPEI  No results found for: KPAFRELGTCHN, LAMBDASER, KAPLAMBRATIO  Lab Results  Component Value Date   WBC 11.4 (H)  06/23/2017   NEUTROABS 8.8 (H) 06/23/2017   HGB 10.4 (L) 06/23/2017   HCT 32.5 (L) 06/23/2017   MCV 95.3 06/23/2017   PLT 360 06/23/2017      Chemistry      Component Value Date/Time   NA 140 06/09/2017 1110   K 4.2 06/09/2017 1110   CL 106 03/01/2014 0607   CO2 29 06/09/2017 1110   BUN 15.7 06/09/2017 1110   CREATININE 0.8 06/09/2017 1110      Component Value Date/Time   CALCIUM 9.3 06/09/2017 1110   ALKPHOS 98 06/09/2017 1110   AST 13 06/09/2017 1110   ALT 17 06/09/2017 1110   BILITOT 0.31 06/09/2017 1110       No results found for: LABCA2  No components found for: [id number]  No results for input(s): INR in the last 168 hours.  Urinalysis    Component Value Date/Time   COLORURINE YELLOW 02/23/2014 1004   APPEARANCEUR CLEAR 02/23/2014 1004   LABSPEC 1.025 02/23/2014 1004   PHURINE 5.5 02/23/2014 1004   GLUCOSEU NEGATIVE 02/23/2014 1004   HGBUR NEGATIVE 02/23/2014 1004   BILIRUBINUR NEGATIVE 02/23/2014 1004   KETONESUR NEGATIVE 02/23/2014 1004   PROTEINUR NEGATIVE 02/23/2014 1004   UROBILINOGEN 0.2 02/23/2014 1004   NITRITE NEGATIVE 02/23/2014 1004   LEUKOCYTESUR NEGATIVE 02/23/2014 1004     STUDIES: US Abdomen Limited  Result Date: 06/12/2017 CLINICAL DATA:  Right upper quadrant pain. Evaluate for cholecystitis. EXAM: ULTRASOUND ABDOMEN LIMITED RIGHT UPPER QUADRANT COMPARISON:  Chest CT 03/20/2017 FINDINGS: Gallbladder: No gallstones or wall thickening visualized. There may be a small amount of sludge. No sonographic Murphy sign noted by sonographer. Small echogenic structure along the gallbladder wall measures 0.4 cm. Findings are suggestive for a small gallbladder polyp. Common bile duct: Diameter:  0.3 cm Liver: No focal lesion identified. Within normal limits in parenchymal echogenicity. Portal vein is patent on color Doppler imaging with normal direction of blood flow towards the liver. IMPRESSION: No evidence for cholecystitis.  No biliary dilatation. Small gallbladder polyp and question gallbladder sludge. Electronically Signed   By: Markus Daft M.D.   On: 06/12/2017 14:04   Dg Abd 2 Views  Result Date: 06/09/2017 CLINICAL DATA:  RIGHT upper quadrant pain off and on for 6 months, episodes of sharp pain lasting 45 minutes, relieved by lying flat and stretching, history breast cancer EXAM: ABDOMEN - 2 VIEW COMPARISON:  None FINDINGS: Lung bases clear. S scattered normal stool throughout colon. Paucity of small bowel gas. No bowel dilatation, bowel wall thickening or free air. Bones slightly demineralized. Pelvic phleboliths without definite urinary tract calcifications. IMPRESSION: No acute abnormalities. Electronically Signed   By: Lavonia Dana M.D.   On: 06/09/2017 15:34    ELIGIBLE FOR AVAILABLE RESEARCH PROTOCOL: no  ASSESSMENT: 51 y.o. [place], [place] woman status post left breast overlapping sites biopsy 03/02/2017 for a clinical T3 N1-2, stage IIIA invasive ductal carcinoma, grade 3, estrogen and progesterone receptor negative, HER-2 amplified, with an MIB-1 of 35%  (1) genetics testing07/24/2018 through the Hereditary Gene Panel offered by [facility] found no deleterious mutations in APC, ATM, AXIN2, BARD1, BMPR1A, BRCA1, BRCA2, BRIP1, CDH1, CDKN2A (p14ARF), CDKN2A (p16INK4a), CHEK2, CTNNA1, DICER1, EPCAM (Deletion/duplication testing only), GREM1 (promoter region deletion/duplication testing only), KIT, MEN1, MLH1, MSH2, MSH3, MSH6, MUTYH, NBN, NF1, NHTL1, PALB2, PDGFRA, PMS2, POLD1, POLE, PTEN, RAD50, RAD51C, RAD51D, SDHB, SDHC, SDHD, SMAD4, SMARCA4. STK11, TP53, TSC1, TSC2, and VHL.  The following genes were evaluated for sequence changes only: SDHA and HOXB13 c.251G>A variant  only.  (2)  neoadjuvant chemotherapy consisting of carboplatin and Docetaxel given with trastuzumab and Pertuzumab every 21 days for 6 cycles, starting 03/31/2017  (a) Pertuzumab stopped after cycle 1 secondary to side effects     (b) Pertuzumab resumed cycle 3, not at loading dose.  (3) trastuzumab will be continued to complete 6 months  (a) echocardiogram 03/20/2017 showed an ejection fraction of 55%   (4) definitive surgery to follow chemotherapy  (5) adjuvant radiation to follow surgery   PLAN: Teah is doing well today.  Her labs are stable (CBC reviewed, CMET pending) that have resulted so far.  She is tolerating chemotherapy well.  I am unclear why she continues to have this RUQ pain and tenderness.  I am happy that the nausea has improved.  She has CT chest coming up on Thursday this week to help further evaluate this pain.  She will see Dr. Excell Seltzer on Friday.  After discussion with Dr. Jana Hakim and Maria Hester, she will proceed with chemotherapy today.    Darianne has lab and f/u in one week scheduled.  She knows to call for any questions prior to her next appointment with [place].     Gardenia Phlegm, NP 04/22/2017 5:56 PM Medical Oncology and Hematology [facility] [address] [place], [place] [address] Tel. [phone]    Fax. [phone]   ADDENDUM: Maria Hester continues to have this unusual discomfort in the right upper quadrant. We had evaluated itwith an abdominal ultrasound which really did not show anything. She also had a CT scan of the chest in June, which was negative.  I suggested that perhaps we could delay her chemotherapy test a week but she is very motivated and wanted to go ahead and get treated today. I really do not see a contraindication to that  We're going to set her up for CT scan of the chest just to make sure we're not missing anything.we will call her with those results.  I personally saw this patient and performed a substantive portion of this  encounter with the listed APP documented above.   Chauncey Cruel, MD Medical Oncology and Hematology [facility] [address] [place], [place] [address] Tel. [phone]    Fax. [phone]

## 2017-06-23 NOTE — Telephone Encounter (Signed)
Patient scheduled for CT on 9/14.  Appt time moved to 9/13 11:15am.  Patient was in lobby and informed of schedule change and given written copy of information.  She voiced understanding.

## 2017-06-24 ENCOUNTER — Telehealth: Payer: Self-pay | Admitting: Adult Health

## 2017-06-24 NOTE — Telephone Encounter (Signed)
Per 9/11 no los °

## 2017-06-25 ENCOUNTER — Ambulatory Visit (HOSPITAL_COMMUNITY)
Admission: RE | Admit: 2017-06-25 | Discharge: 2017-06-25 | Disposition: A | Discharge status: Home / Self Care | Payer: 59 | Source: Ambulatory Visit | Attending: Adult Health | Admitting: Adult Health

## 2017-06-25 DIAGNOSIS — R0789 Other chest pain: Secondary | ICD-10-CM | POA: Diagnosis present

## 2017-06-25 MED ORDER — IOPAMIDOL (ISOVUE-300) INJECTION 61%
INTRAVENOUS | Status: AC
  Filled 2017-06-25: qty 75

## 2017-06-25 MED ORDER — IOPAMIDOL (ISOVUE-300) INJECTION 61%
75.0000 mL | Freq: Once | INTRAVENOUS | Status: AC | PRN
  Administered 2017-06-25: 75 mL via INTRAVENOUS

## 2017-06-26 ENCOUNTER — Ambulatory Visit
Admission: RE | Admit: 2017-06-26 | Discharge: 2017-06-26 | Disposition: A | Discharge status: Home / Self Care | Payer: 59 | Source: Ambulatory Visit | Attending: General Surgery | Admitting: General Surgery

## 2017-06-26 ENCOUNTER — Ambulatory Visit (HOSPITAL_COMMUNITY): Payer: 59

## 2017-06-26 ENCOUNTER — Other Ambulatory Visit: Payer: Self-pay | Admitting: General Surgery

## 2017-06-26 DIAGNOSIS — R1011 Right upper quadrant pain: Secondary | ICD-10-CM

## 2017-06-26 DIAGNOSIS — N649 Disorder of breast, unspecified: Secondary | ICD-10-CM

## 2017-06-26 MED ORDER — IOPAMIDOL (ISOVUE-300) INJECTION 61%
100.0000 mL | Freq: Once | INTRAVENOUS | Status: AC | PRN
  Administered 2017-06-26: 100 mL via INTRAVENOUS

## 2017-06-29 ENCOUNTER — Telehealth: Payer: Self-pay | Admitting: Internal Medicine

## 2017-06-29 NOTE — Telephone Encounter (Signed)
Pt scheduled to see Nicoletta Ba PA 07/02/17@2pm , please notify pt of appt.

## 2017-06-29 NOTE — Telephone Encounter (Signed)
Patient notified of appt and verbally agreed to come in

## 2017-06-29 NOTE — Telephone Encounter (Signed)
Received urgent referral for pt to have ov for RUQ abd pain. Pt has no gi hx but gallbladder US found sludge and 30mm polyp. No morning appts with APP. Dr.Gessner DOD today. Referral placed on Sheri's desk. Please advise on scheduling.

## 2017-06-30 ENCOUNTER — Telehealth: Payer: Self-pay | Admitting: Adult Health

## 2017-06-30 ENCOUNTER — Ambulatory Visit (HOSPITAL_BASED_OUTPATIENT_CLINIC_OR_DEPARTMENT_OTHER): Payer: 59 | Admitting: Adult Health

## 2017-06-30 ENCOUNTER — Encounter (HOSPITAL_COMMUNITY): Payer: Self-pay | Admitting: Cardiology

## 2017-06-30 ENCOUNTER — Ambulatory Visit (HOSPITAL_BASED_OUTPATIENT_CLINIC_OR_DEPARTMENT_OTHER)
Admission: RE | Admit: 2017-06-30 | Discharge: 2017-06-30 | Disposition: A | Discharge status: Home / Self Care | Payer: 59 | Source: Ambulatory Visit | Attending: Cardiology | Admitting: Cardiology

## 2017-06-30 ENCOUNTER — Other Ambulatory Visit (HOSPITAL_BASED_OUTPATIENT_CLINIC_OR_DEPARTMENT_OTHER): Payer: 59

## 2017-06-30 ENCOUNTER — Encounter: Payer: Self-pay | Admitting: Adult Health

## 2017-06-30 ENCOUNTER — Ambulatory Visit (HOSPITAL_COMMUNITY)
Admission: RE | Admit: 2017-06-30 | Discharge: 2017-06-30 | Disposition: A | Discharge status: Home / Self Care | Payer: 59 | Source: Ambulatory Visit | Attending: Obstetrics and Gynecology | Admitting: Obstetrics and Gynecology

## 2017-06-30 ENCOUNTER — Other Ambulatory Visit: Payer: Self-pay | Admitting: General Surgery

## 2017-06-30 VITALS — BP 107/56 | HR 86 | Temp 98.0°F | Resp 18 | Ht 65.5 in | Wt 187.5 lb

## 2017-06-30 VITALS — BP 100/63 | HR 88 | Wt 186.5 lb

## 2017-06-30 DIAGNOSIS — Z9221 Personal history of antineoplastic chemotherapy: Secondary | ICD-10-CM | POA: Diagnosis not present

## 2017-06-30 DIAGNOSIS — N649 Disorder of breast, unspecified: Secondary | ICD-10-CM | POA: Diagnosis not present

## 2017-06-30 DIAGNOSIS — C50812 Malignant neoplasm of overlapping sites of left female breast: Secondary | ICD-10-CM

## 2017-06-30 DIAGNOSIS — Z171 Estrogen receptor negative status [ER-]: Secondary | ICD-10-CM | POA: Insufficient documentation

## 2017-06-30 DIAGNOSIS — Z87891 Personal history of nicotine dependence: Secondary | ICD-10-CM | POA: Insufficient documentation

## 2017-06-30 DIAGNOSIS — R1011 Right upper quadrant pain: Secondary | ICD-10-CM

## 2017-06-30 HISTORY — DX: Diaphragmatic hernia without obstruction or gangrene: K44.9

## 2017-06-30 LAB — CBC WITH DIFFERENTIAL/PLATELET
BASO%: 0.5 % (ref 0.0–2.0)
BASOS ABS: 0 10*3/uL (ref 0.0–0.1)
EOS ABS: 0.2 10*3/uL (ref 0.0–0.5)
EOS%: 2.2 % (ref 0.0–7.0)
HCT: 33.9 % — ABNORMAL LOW (ref 34.8–46.6)
HEMOGLOBIN: 11.3 g/dL — AB (ref 11.6–15.9)
LYMPH#: 4 10*3/uL — AB (ref 0.9–3.3)
LYMPH%: 48.9 % (ref 14.0–49.7)
MCH: 31.2 pg (ref 25.1–34.0)
MCHC: 33.3 g/dL (ref 31.5–36.0)
MCV: 93.7 fL (ref 79.5–101.0)
MONO#: 1.5 10*3/uL — ABNORMAL HIGH (ref 0.1–0.9)
MONO%: 18.2 % — ABNORMAL HIGH (ref 0.0–14.0)
NEUT%: 30.2 % — AB (ref 38.4–76.8)
NEUTROS ABS: 2.5 10*3/uL (ref 1.5–6.5)
PLATELETS: 323 10*3/uL (ref 145–400)
RBC: 3.61 10*6/uL — ABNORMAL LOW (ref 3.70–5.45)
RDW: 18.7 % — ABNORMAL HIGH (ref 11.2–14.5)
WBC: 8.2 10*3/uL (ref 3.9–10.3)

## 2017-06-30 LAB — COMPREHENSIVE METABOLIC PANEL
ALBUMIN: 3.6 g/dL (ref 3.5–5.0)
ALT: 21 U/L (ref 0–55)
AST: 18 U/L (ref 5–34)
Alkaline Phosphatase: 99 U/L (ref 40–150)
Anion Gap: 9 mEq/L (ref 3–11)
BILIRUBIN TOTAL: 0.33 mg/dL (ref 0.20–1.20)
BUN: 15.3 mg/dL (ref 7.0–26.0)
CO2: 24 mEq/L (ref 22–29)
CREATININE: 1 mg/dL (ref 0.6–1.1)
Calcium: 9.5 mg/dL (ref 8.4–10.4)
Chloride: 102 mEq/L (ref 98–109)
EGFR: 63 mL/min/{1.73_m2} — AB (ref >90)
GLUCOSE: 137 mg/dL (ref 70–140)
Potassium: 4.2 mEq/L (ref 3.5–5.1)
Sodium: 135 mEq/L — ABNORMAL LOW (ref 136–145)
TOTAL PROTEIN: 6.6 g/dL (ref 6.4–8.3)

## 2017-06-30 NOTE — Progress Notes (Signed)
  Echocardiogram 2D Echocardiogram has been performed.  Maria Hester 06/30/2017, 9:22 AM

## 2017-06-30 NOTE — Patient Instructions (Signed)
Your physician has requested that you have an echocardiogram. Echocardiography is a painless test that uses sound waves to create images of your heart. It provides your doctor with information about the size and shape of your heart and how well your heart's chambers and valves are working. This procedure takes approximately one hour. There are no restrictions for this procedure.  Your physician recommends that you schedule a follow-up appointment in: 3 months  

## 2017-06-30 NOTE — Progress Notes (Signed)
Oncology: Dr. Jana Hakim  51 yo with history of hyperlipidemia was diagnosed with breast cancer and was referred for cardio-oncology evaluation.  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  She will have neadjuvant chemotherapy with carboplatin/Taxotere + trastuzumab/pertuzumab x 6 cycles starting 03/31/17. She will then continue trastuzumab for a year. She will have surgery and adjuvant radiation. She has one more cycle of chemotherapy currently.   She has no history of heart disease.  Her mother did have CHF, ?viral myocarditis.  She is generally doing well symptomatically.  No chest pain, dyspnea, or palpitations.   PMH: 1. Hyperlipidemia 2. 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  She will have neadjuvant chemotherapy with carboplatin/Taxotere + trastuzumab/pertuzumab x 6 cycles starting 03/31/17. She will then continue trastuzumab for a year. She will have surgery and adjuvant radiation.  - Echo (6/18): EF 55%, GLS -18.9%.  - Echo (9/18): EF 55%, GLS -19%, mildly dilated RV with normal systolic function.   FH: Mother with possible viral myocarditis and CHF, father with MI.   Social History   Social History  . Marital status: Married    Spouse name: N/A  . Number of children: N/A  . Years of education: N/A   Occupational History  . Not on file.   Social History Main Topics  . Smoking status: Former Smoker    Packs/day: 0.50    Years: 34.00    Types: Cigarettes  . Smokeless tobacco: Never Used  . Alcohol use No     Comment: social drink   . Drug use: No  . Sexual activity: Yes    Birth control/ protection: Surgical     Comment: hyst   Other Topics Concern  . Not on file   Social History Narrative  . No narrative on file   ROS: All systems reviewed and negative except as per HPI.   Current Outpatient Prescriptions  Medication Sig Dispense Refill  . ALPRAZolam (XANAX) 0.5 MG tablet TAKE HALF TO 1 TABLET BY MOUTH EVERY 8 HOURS AS NEEDED FOR ANXIETY  2  . cholestyramine  (QUESTRAN) 4 GM/DOSE powder Take 1 packet (4 g total) by mouth 2 (two) times daily. 378 g 12  . dexamethasone (DECADRON) 4 MG tablet TAKE 2 TABLETS BY MOUTH TWICE DAILY. START DAY BEFORE TAZOTERE THEN AGAIN THE DAY AFTER CHEMO 30 tablet 1  . esomeprazole (NEXIUM) 40 MG capsule Take 40 mg by mouth daily at 12 noon.    . lidocaine-prilocaine (EMLA) cream Apply to affected area once 30 g 3  . LORazepam (ATIVAN) 0.5 MG tablet TAKE 1 TABLET BY MOUTH AT BEDTIME AS NEEDED FOR NAUSEA AND VOMITING 30 tablet 0  . metroNIDAZOLE (METROGEL) 1 % gel Apply topically daily. 45 g 0  . ondansetron (ZOFRAN) 8 MG tablet Take 1 tablet (8 mg total) by mouth every 8 (eight) hours as needed for nausea or vomiting. 30 tablet 3  . prochlorperazine (COMPAZINE) 10 MG tablet TAKE 1 TABLET BY MOUTH EVERY 6 HOURS AS NEEDED FOR NAUSEA AND VOMITING 30 tablet 1   No current facility-administered medications for this encounter.    Facility-Administered Medications Ordered in Other Encounters  Medication Dose Route Frequency Provider Last Rate Last Dose  . heparin lock flush 100 unit/mL  500 Units Intracatheter Once Magrinat, Virgie Dad, MD      . sodium chloride flush (NS) 0.9 % injection 10 mL  10 mL Intracatheter Once Magrinat, Virgie Dad, MD       BP 100/63   Pulse 88   Wt 186 lb  8 oz (84.6 kg)   LMP 02/11/2014   SpO2 99%   BMI 30.56 kg/m ' General: NAD Neck: No JVD, no thyromegaly or thyroid nodule.  Lungs: Clear to auscultation bilaterally with normal respiratory effort. CV: Nondisplaced PMI.  Heart regular S1/S2, no S3/S4, no murmur.  No peripheral edema.  No carotid bruit.  Normal pedal pulses.  Abdomen: Soft, nontender, no hepatosplenomegaly, no distention.  Skin: Intact without lesions or rashes.  Neurologic: Alert and oriented x 3.  Psych: Normal affect. Extremities: No clubbing or cyanosis.  HEENT: Normal.   Assessment/Plan: 51 yo with breast cancer.  She will get Herceptin for a year. I reviewed today's echo:  EF and strain are stable compared to prior.  She will followup in 3 months with repeat echo.  Loralie Champagne 06/30/2017

## 2017-06-30 NOTE — Progress Notes (Signed)
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 Today is day 8 cycle 5 of 6 planned cycles of chemoimmunotherapy, given every 3 weeks, to be followed by surgery then radiation.  She is doing well today. She did have some pain in her lower back over this week, that lasted one night, but has had no other issues.    REVIEW OF SYSTEMS: Maria Hester saw Dr. Excell Seltzer on Friday due to her RUQ pain.  He ordered CT A/P and referred her to GI.  This pain remains stable and her scans this far have been negative.  She is also fatigued and has some taste changes.  Maria Hester denies any other issues today and a detailed ROS is non contributory.    BREAST CANCER HISTORY: From the original intake note:  Maria Hester herself noted a change in her left breast when looking into a mirror and then palpating a mass in the upper-outer quadrant. She brought this to medical attention and on 02/24/2017 underwent bilateral diagnostic mammography with tomography and left breast ultrasonography at the breast Center. The breast density was category C. In the upper portion of the left breast there was a broad area of distortion measuring up to 7.5 cm. On exam there was a broad firm area involving the upper outer quadrant of  the left breast which was visibly protruding. By ultrasound at the 11:00 axis 8 cm from the nipple there was an irregular hypoechoic mass measuring 1.4 cm with additiona ases at 2:00, 3 cm from the nipple and multiple other masses as described, all in the upper-outer quadrant primarily. Ultrasound of the left axilla showed at least 3 prominent lymph nodes one of which had a cortical bulge.  On 03/02/2017 the patient underwent biopsy of a left breast mass at the 11:30 o'clock position and a second mass described as upper outer quadrant as well as one of the suspicious left axillary lymph nodes. These all showed invasive ductal carcinoma, grade 3. Prognostic panel from one of the 2 masses showed the tumor to be estrogen and progesterone receptor negative, but HER-2 amplified, with a signals ratio of 8.24 and the number per cell 15.65.  Her subsequent history is as detailed below   PAST MEDICAL HISTORY: Past Medical History:  Diagnosis Date  . Breast cancer ([place])   . 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 Complication of anesthesia    pt states BP tends to drop and has a hard time waking up - pt was told by prior anesthesiologist to make sure this was made known for furture surgeries  . Family history of breast cancer   . Hiatal hernia   . Uterine fibroid     PAST SURGICAL HISTORY: Past Surgical History:  Procedure Laterality Date  . ABDOMINAL HYSTERECTOMY    . ABDOMINOPLASTY N/A 02/28/2014   Procedure: ABDOMINOPLASTY  with Panniculectomy;  Surgeon: [name], MD;  Location: [facility];  Service: Gynecology;  Laterality: N/A;  . APPENDECTOMY    . BILATERAL SALPINGECTOMY Bilateral 02/28/2014   Procedure: BILATERAL SALPINGECTOMY;  Surgeon: [name], MD;  Location: [facility];  Service: Gynecology;  Laterality: Bilateral;  . CATARACT EXTRACTION, BILATERAL    . PORTACATH PLACEMENT Right 03/18/2017   Procedure: INSERTION PORT-A-CATH;   Surgeon: Excell Seltzer, MD;  Location: [facility];  Service: General;  Laterality: Right;  . reverse tubal ligation    . SCAR REVISION N/A 02/28/2014   Procedure: SCAR REVISION;  Surgeon: [name], MD;  Location: [facility];  Service: Gynecology;  Laterality: N/A;  . SUPRACERVICAL ABDOMINAL HYSTERECTOMY N/A 02/28/2014   Procedure: HYSTERECTOMY SUPRACERVICAL ABDOMINAL;  Surgeon: [name], MD;  Location: [facility];  Service: Gynecology;  Laterality: N/A;  . TUBAL LIGATION    . WISDOM TOOTH EXTRACTION      FAMILY HISTORY Family History  Problem Relation Age of Onset  . Heart disease Mother   . Hypertension Mother   . Diabetes Father   . Heart disease Father   . Heart disease Maternal Grandmother   . Colon cancer Maternal Grandmother 4       died in her 49's  . Heart disease Maternal Grandfather   . Esophageal cancer Maternal Grandfather 50       died at 56  . Diabetes Paternal Grandmother   . Heart disease Paternal Grandmother   . Kidney cancer Paternal Grandmother 40       died in her 48's  . Tuberculosis Paternal Grandfather   . Heart disease Paternal Grandfather   . Skin cancer Paternal Grandfather 17       died at 39, patient not sure if it was melanoma, BCC, Squamous, etc.  . Breast cancer Maternal Aunt 50       died at 7  . Breast cancer Cousin 25       she is now in her 29's  The patient's father died from heart disease at age 78. The patient's mother is living at age 79. The patient has one brother, 2 sisters. On the mother's side and aunt had breast cancer at age 63 and her daughter had breast cancer in her early 28s. There is also a grandfather with esophageal cancer and a grandmother with colon cancer. On the paternal side there is a grandmother with kidney cancer at an early age and a grandfather with melanoma at an early age.  GYNECOLOGIC HISTORY:  Patient's last menstrual period was 02/11/2014.  Menarche age 56, first live birth age 40, the patient is [place] P2.  She underwent hysterectomy without salpingo-oophorectomy May 2016. She did not use hormone replacement. She did use oral contraceptives approximately 10 years in the 1980s.  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 Her husband Fish farm manager in the same [place], which is a 10% aviation enrollment. Daughter Lorenda Hatchet is a hairstylist in [place] and son Chip Boer is a Physiological scientist in [place] the patient has 2 grandsons and one granddaughter. She attends a [employer]    ADVANCED DIRECTIVES: The patient has completed advanced directives and will have the motorized at the next visit   HEALTH MAINTENANCE: Social History  Substance Use Topics  . Smoking status: Former Smoker    Packs/day: 0.50    Years: 34.00    Types: Cigarettes  . Smokeless tobacco: Never Used  .  Alcohol use No     Comment: social drink      Colonoscopy:No  PAP: 03/04/2017  Bone density: No   No Known Allergies  Current Outpatient Prescriptions  Medication Sig Dispense Refill  . ALPRAZolam (XANAX) 0.5 MG tablet TAKE HALF TO 1 TABLET BY MOUTH EVERY 8 HOURS AS NEEDED FOR ANXIETY  2  . cholestyramine (QUESTRAN) 4 GM/DOSE powder Take 1 packet (4 g total) by mouth 2 (two) times daily. 378 g 12  . dexamethasone (DECADRON) 4 MG tablet TAKE 2 TABLETS BY MOUTH TWICE DAILY. START DAY BEFORE TAZOTERE THEN AGAIN THE DAY AFTER CHEMO 30 tablet 1  . esomeprazole (NEXIUM) 40 MG capsule Take 40 mg by mouth daily at 12 noon.    . lidocaine-prilocaine (EMLA) cream Apply to affected area once 30 g 3  . LORazepam (ATIVAN) 0.5 MG tablet TAKE 1 TABLET BY MOUTH AT BEDTIME AS NEEDED FOR NAUSEA AND VOMITING 30 tablet 0  . metroNIDAZOLE (METROGEL) 1 % gel Apply topically daily. 45 g 0  . ondansetron (ZOFRAN) 8 MG tablet Take 1 tablet (8 mg total) by mouth every 8 (eight) hours as needed for nausea or vomiting. 30 tablet 3  . prochlorperazine (COMPAZINE) 10  MG tablet TAKE 1 TABLET BY MOUTH EVERY 6 HOURS AS NEEDED FOR NAUSEA AND VOMITING 30 tablet 1   No current facility-administered medications for this visit.    Facility-Administered Medications Ordered in Other Visits  Medication Dose Route Frequency Provider Last Rate Last Dose  . heparin lock flush 100 unit/mL  500 Units Intracatheter Once Magrinat, Virgie Dad, MD      . sodium chloride flush (NS) 0.9 % injection 10 mL  10 mL Intracatheter Once Magrinat, Virgie Dad, MD        OBJECTIVE:  Vitals:   06/30/17 1312  BP: (!) 107/56  Pulse: 86  Resp: 18  Temp: 98 F (36.7 C)  SpO2: 99%     Body mass index is 30.73 kg/m.    ECOG FS:1 - Symptomatic but completely ambulatory GENERAL: Patient is a well appearing female in no acute distress HEENT:  Sclerae anicteric. PERRL. Oropharynx clear and moist. No ulcerations or evidence of oropharyngeal candidiasis. Neck is supple.  NODES:  No cervical, supraclavicular, or axillary lymphadenopathy palpated.  BREAST EXAM:  Deferred. LUNGS:  Clear to auscultation bilaterally.  No wheezes or rhonchi. HEART:  Regular rate and rhythm. No murmur appreciated. ABDOMEN:  Soft, NT, ND  Positive, normoactive bowel sounds. No organomegaly palpated. MSK:  No focal spinal tenderness to palpation. Full range of motion bilaterally in the upper extremities. EXTREMITIES:  No peripheral edema.   SKIN:  Clear with no obvious rashes or skin changes. No nail dyscrasia. NEURO:  Nonfocal. Well oriented.  Appropriate affect.    LAB RESULTS:  CMP     Component Value Date/Time   NA 135 (L) 06/30/2017 1214   K 4.2 06/30/2017 1214   CL 106 03/01/2014 0607   CO2 24 06/30/2017 1214   GLUCOSE 137 06/30/2017 1214   BUN 15.3 06/30/2017 1214   CREATININE 1.0 06/30/2017 1214   CALCIUM 9.5 06/30/2017 1214   PROT 6.6 06/30/2017 1214   ALBUMIN 3.6 06/30/2017 1214   AST 18 06/30/2017 1214   ALT 21 06/30/2017 1214   ALKPHOS 99 06/30/2017 1214   BILITOT 0.33 06/30/2017 1214    GFRNONAA >90 03/01/2014 0607   GFRAA >90 03/01/2014 0607    No results found for: TOTALPROTELP, ALBUMINELP, A1GS, A2GS, BETS, BETA2SER, [place], MSPIKE,  SPEI  No results found for: Nils Pyle, [facility]  Lab Results  Component Value Date   WBC 8.2 06/30/2017   NEUTROABS 2.5 06/30/2017   HGB 11.3 (L) 06/30/2017   HCT 33.9 (L) 06/30/2017   MCV 93.7 06/30/2017   PLT 323 06/30/2017      Chemistry      Component Value Date/Time   NA 135 (L) 06/30/2017 1214   K 4.2 06/30/2017 1214   CL 106 03/01/2014 0607   CO2 24 06/30/2017 1214   BUN 15.3 06/30/2017 1214   CREATININE 1.0 06/30/2017 1214      Component Value Date/Time   CALCIUM 9.5 06/30/2017 1214   ALKPHOS 99 06/30/2017 1214   AST 18 06/30/2017 1214   ALT 21 06/30/2017 1214   BILITOT 0.33 06/30/2017 1214       No results found for: LABCA2  No components found for: ERXVQM086  No results for input(s): INR in the last 168 hours.  Urinalysis    Component Value Date/Time   COLORURINE YELLOW 02/23/2014 1004   APPEARANCEUR CLEAR 02/23/2014 1004   LABSPEC 1.025 02/23/2014 1004   PHURINE 5.5 02/23/2014 1004   GLUCOSEU NEGATIVE 02/23/2014 1004   HGBUR NEGATIVE 02/23/2014 1004   BILIRUBINUR NEGATIVE 02/23/2014 1004   KETONESUR NEGATIVE 02/23/2014 1004   PROTEINUR NEGATIVE 02/23/2014 1004   UROBILINOGEN 0.2 02/23/2014 1004   NITRITE NEGATIVE 02/23/2014 1004   LEUKOCYTESUR NEGATIVE 02/23/2014 1004     STUDIES: Ct Chest W Contrast  Result Date: 06/25/2017 CLINICAL DATA:  Left breast ca dx' d 02/2017 with ongoing chemo C/o anterior chest wall pain x 2-3 mos. Former smoker EXAM: CT CHEST WITH CONTRAST TECHNIQUE: Multidetector CT imaging of the chest was performed during intravenous contrast administration. CONTRAST:  41m ISOVUE-300 IOPAMIDOL (ISOVUE-300) INJECTION 61% COMPARISON:  03/20/2017 bone scan and chest CT FINDINGS: Cardiovascular: No significant vascular findings. Normal heart size. No pericardial  effusion. Right-sided Port-A-Cath tip in the superior vena cava. Mediastinum/Nodes: The visualized portion of the thyroid gland has a normal appearance. No mediastinal, hilar, or axillary adenopathy. Small hiatal hernia is present. Lungs/Pleura: Lungs are clear. No pleural effusion or pneumothorax. Upper Abdomen: A 6 mm low-attenuation lesion is identified at the dome of the right hepatic lobe, stable in appearance and favored to be benign. Musculoskeletal: No chest wall abnormality. No acute or significant osseous findings. Normal appearance of the sternum. IMPRESSION: 1. Normal appearance of the chest. 2. Right-sided Port-A-Cath tip to the superior vena cava. Electronically Signed   By: ENolon NationsM.D.   On: 06/25/2017 13:35   Ct Abdomen Pelvis W Contrast  Result Date: 06/26/2017 CLINICAL DATA:  Epigastric abdominal pain after eating. EXAM: CT ABDOMEN AND PELVIS WITH CONTRAST TECHNIQUE: Multidetector CT imaging of the abdomen and pelvis was performed using the standard protocol following bolus administration of intravenous contrast. CONTRAST:  1097mISOVUE-300 IOPAMIDOL (ISOVUE-300) INJECTION 61% COMPARISON:  Abdominal ultrasound 06/12/2017. FINDINGS: Lower chest: The lung bases are clear of acute process. No pleural effusion or pulmonary lesions. The heart is normal in size. No pericardial effusion. The distal esophagus and aorta are unremarkable. Small hiatal hernia noted. Hepatobiliary: No focal hepatic lesions or intrahepatic biliary dilatation. The gallbladder demonstrates dependent layering higher attenuation tear which could be sludge. No definite gallstones were seen on the ultrasound. No findings for acute cholecystitis. No common bile duct dilatation. No common bile duct dilatation. Pancreas: No mass, inflammation or ductal dilatation. Spleen: Normal size.  No focal lesions. Adrenals/Urinary Tract: The adrenal glands and kidneys  are unremarkable. No renal, ureteral or bladder calculi or mass.  Stomach/Bowel: The stomach, duodenum, small bowel and colon are grossly normal without oral contrast. No acute inflammatory changes, mass lesions or obstructive findings. The terminal ileum is normal. The appendix is surgically absent. Vascular/Lymphatic: The aorta is normal in caliber. No dissection. Scattered atherosclerotic calcifications. The branch vessels are patent. The major venous structures are patent. No mesenteric or retroperitoneal mass or adenopathy. Small scattered lymph nodes are noted. Reproductive: Status post supracervical hysterectomy. Other: No pelvic mass or adenopathy. No free pelvic fluid collections. No inguinal mass or adenopathy. No abdominal wall hernia or subcutaneous lesions. Musculoskeletal: No significant bony findings. Degenerative changes at the pubic symphysis. IMPRESSION: 1. Layering high attenuation material in the gallbladder likely gallbladder sludge. No CT findings to suggest acute cholecystitis. 2. No acute abdominal/pelvic findings, mass lesions or adenopathy. 3. Small hiatal hernia. 4. Age advanced atherosclerotic calcifications involving the distal aorta and iliac arteries. Electronically Signed   By: Marijo Sanes M.D.   On: 06/26/2017 15:15   US Abdomen Limited  Result Date: 06/12/2017 CLINICAL DATA:  Right upper quadrant pain. Evaluate for cholecystitis. EXAM: ULTRASOUND ABDOMEN LIMITED RIGHT UPPER QUADRANT COMPARISON:  Chest CT 03/20/2017 FINDINGS: Gallbladder: No gallstones or wall thickening visualized. There may be a small amount of sludge. No sonographic Murphy sign noted by sonographer. Small echogenic structure along the gallbladder wall measures 0.4 cm. Findings are suggestive for a small gallbladder polyp. Common bile duct: Diameter: 0.3 cm Liver: No focal lesion identified. Within normal limits in parenchymal echogenicity. Portal vein is patent on color Doppler imaging with normal direction of blood flow towards the liver. IMPRESSION: No evidence for  cholecystitis.  No biliary dilatation. Small gallbladder polyp and question gallbladder sludge. Electronically Signed   By: Markus Daft M.D.   On: 06/12/2017 14:04   Dg Abd 2 Views  Result Date: 06/09/2017 CLINICAL DATA:  RIGHT upper quadrant pain off and on for 6 months, episodes of sharp pain lasting 45 minutes, relieved by lying flat and stretching, history breast cancer EXAM: ABDOMEN - 2 VIEW COMPARISON:  None FINDINGS: Lung bases clear. S scattered normal stool throughout colon. Paucity of small bowel gas. No bowel dilatation, bowel wall thickening or free air. Bones slightly demineralized. Pelvic phleboliths without definite urinary tract calcifications. IMPRESSION: No acute abnormalities. Electronically Signed   By: Lavonia Dana M.D.   On: 06/09/2017 15:34    ELIGIBLE FOR AVAILABLE RESEARCH PROTOCOL: no  ASSESSMENT: 51 y.o. [place], [place] woman status post left breast overlapping sites biopsy 03/02/2017 for a clinical T3 N1-2, stage IIIA invasive ductal carcinoma, grade 3, estrogen and progesterone receptor negative, HER-2 amplified, with an MIB-1 of 35%  (1) genetics testing07/24/2018 through the Hereditary Gene Panel offered by [facility] found no deleterious mutations in APC, ATM, AXIN2, BARD1, BMPR1A, BRCA1, BRCA2, BRIP1, CDH1, CDKN2A (p14ARF), CDKN2A (p16INK4a), CHEK2, CTNNA1, DICER1, EPCAM (Deletion/duplication testing only), GREM1 (promoter region deletion/duplication testing only), KIT, MEN1, MLH1, MSH2, MSH3, MSH6, MUTYH, NBN, NF1, NHTL1, PALB2, PDGFRA, PMS2, POLD1, POLE, PTEN, RAD50, RAD51C, RAD51D, SDHB, SDHC, SDHD, SMAD4, SMARCA4. STK11, TP53, TSC1, TSC2, and VHL.  The following genes were evaluated for sequence changes only: SDHA and HOXB13 c.251G>A variant only.  (2) neoadjuvant chemotherapy consisting of carboplatin and Docetaxel given with trastuzumab and Pertuzumab every 21 days for 6 cycles, starting 03/31/2017  (a) Pertuzumab stopped after cycle 1 secondary to side effects     (b)  Pertuzumab resumed cycle 3, not at loading dose.  (3) trastuzumab  will be continued to complete 6 months  (a) echocardiogram 03/20/2017 showed an ejection fraction of 55%   (4) definitive surgery to follow chemotherapy  (5) adjuvant radiation to follow surgery   PLAN: Heidie is doing well today.  Her labs today are stable and I reviewed those with her in detail.  She is nearing the end of her neoadjuvant chemotherapy.  I have ordered her MRI breasts and communicated with Bary Castilla, RN her navigator to arrange the appropriate appointments to discuss and plan her surgery.   Robbye will return on 10/2 for labs, follow up and her final chemotherapy.  She knows to call prior to her next appointment for any questions or concerns.    A total of (20) minutes of face-to-face time was spent with this patient with greater than 50% of that time in counseling and care-coordination.  Gardenia Phlegm, NP 04/22/2017 5:56 PM Medical Oncology and Hematology [facility] [address] [place], [place] [address] Tel. [phone]    Fax. [phone]

## 2017-06-30 NOTE — Telephone Encounter (Signed)
LVM in reference to upcoming MRI appointment

## 2017-07-01 ENCOUNTER — Telehealth: Payer: Self-pay | Admitting: Adult Health

## 2017-07-01 NOTE — Telephone Encounter (Signed)
Per 9/18 no los °

## 2017-07-02 ENCOUNTER — Encounter: Payer: Self-pay | Admitting: Physician Assistant

## 2017-07-02 ENCOUNTER — Ambulatory Visit (INDEPENDENT_AMBULATORY_CARE_PROVIDER_SITE_OTHER): Payer: 59 | Admitting: Physician Assistant

## 2017-07-02 VITALS — BP 100/68 | HR 72 | Ht 65.0 in | Wt 186.4 lb

## 2017-07-02 DIAGNOSIS — R0789 Other chest pain: Secondary | ICD-10-CM | POA: Diagnosis not present

## 2017-07-02 DIAGNOSIS — C50919 Malignant neoplasm of unspecified site of unspecified female breast: Secondary | ICD-10-CM | POA: Diagnosis not present

## 2017-07-02 DIAGNOSIS — R1013 Epigastric pain: Secondary | ICD-10-CM

## 2017-07-02 MED ORDER — ESOMEPRAZOLE MAGNESIUM 40 MG PO CPDR: 40.0000 mg | DELAYED_RELEASE_CAPSULE | Freq: Two times a day (BID) | ORAL | 2 refills | Status: DC

## 2017-07-02 NOTE — Progress Notes (Addendum)
Subjective:    Patient ID: Maria Hester, female    DOB: 1966/09/02, 51 y.o.   MRN: [id number]  HPI Maria Hester is a very nice 51 year old white female, referred by Dr. Excell Seltzer, and new to GI. Patient has no previous GI issues. She is currently undergoing chemotherapy for breast cancer, and comes in with complaints of epigastric and right upper quadrant pain. Patient states that she started chemotherapy in June and is having infusions every 3 weeks. Her last chemotherapy is scheduled for October 2 and she is scheduled for an MRI on October 3, then anticipates scheduling for breast surgery. Her current symptoms haven't present for about 6 months, with mild symptoms predating starting chemotherapy. She complains of subxiphoid discomfort with swallowing. She says sometimes she has to stop eating and stretch out her back to allow food to pass. She also says subxiphoid discomfort which was bothering her frequently at nighttime. With lying on her left sided night she seems to have a lot of belching and if she lies on her right side she feels more discomfort in her epigastrium. She describes a mildly queasy "nervous" sensation in her epigastrium which is fairly constant and is not having any overt nausea or vomiting and has not found anti-emetics helpful. She has no complaints of heartburn. She has been on Nexium 20 mg OTC in the morning over the past couple of months. Appetite overall is okay. No regular aspirin or NSAID use. Bowel movements have been intermittently loose. Most recent labs done on 06/23/2017 WBC 11.4 hemoglobin 10.4 hematocrit of 32.5 chemistries unremarkable. Upper abdominal ultrasound on 06/12/2017 no gallstones she did have a small amount of sludge and a possible 4 mm gallbladder polyp. CT of the abdomen and pelvis done on 06/26/2017 showed layering sludge no definite gallstones, no ductal dilation, she has a small hiatal hernia and is noted to have scattered atherosclerotic  calcifications,, and small scattered lymph nodes. Patient tells me that Dr. Excell Seltzer plans to take her gallbladder out at the time of her breast surgery but wanted her to have GI evaluation in the interim.  Review of Systems Pertinent positive and negative review of systems were noted in the above HPI section.  All other review of systems was otherwise negative.  Outpatient Encounter Prescriptions as of 07/02/2017  Medication Sig  . ALPRAZolam (XANAX) 0.5 MG tablet TAKE HALF TO 1 TABLET BY MOUTH EVERY 8 HOURS AS NEEDED FOR ANXIETY  . cholestyramine (QUESTRAN) 4 GM/DOSE powder Take 1 packet (4 g total) by mouth 2 (two) times daily.  Marland Kitchen dexamethasone (DECADRON) 4 MG tablet TAKE 2 TABLETS BY MOUTH TWICE DAILY. START DAY BEFORE TAZOTERE THEN AGAIN THE DAY AFTER CHEMO  . esomeprazole (NEXIUM) 40 MG capsule Take 1 capsule (40 mg total) by mouth 2 (two) times daily before a meal.  . lidocaine-prilocaine (EMLA) cream Apply to affected area once  . LORazepam (ATIVAN) 0.5 MG tablet TAKE 1 TABLET BY MOUTH AT BEDTIME AS NEEDED FOR NAUSEA AND VOMITING  . metroNIDAZOLE (METROGEL) 1 % gel Apply topically daily.  . ondansetron (ZOFRAN) 8 MG tablet Take 1 tablet (8 mg total) by mouth every 8 (eight) hours as needed for nausea or vomiting.  . prochlorperazine (COMPAZINE) 10 MG tablet TAKE 1 TABLET BY MOUTH EVERY 6 HOURS AS NEEDED FOR NAUSEA AND VOMITING  . [DISCONTINUED] esomeprazole (NEXIUM) 40 MG capsule Take 40 mg by mouth daily at 12 noon.   Facility-Administered Encounter Medications as of 07/02/2017  Medication  . heparin lock flush  100 unit/mL  . sodium chloride flush (NS) 0.9 % injection 10 mL   No Known Allergies Patient Active Problem List   Diagnosis Date Noted  . Breast disorder   . Genetic testing 05/12/2017  . Family history of breast cancer   . Port catheter in place 04/28/2017  . Malignant neoplasm of overlapping sites of left breast in female, estrogen receptor negative ([place]) 03/05/2017  .  Nontoxic multinodular goiter 12/25/2015  . Hyperlipidemia 12/25/2015  . Post-operative state 04/05/2014  . S/P subtotal hysterectomy 02/28/2014  . Chronic folliculitis 17/61/6073  . Annual physical exam 12/19/2013   Social History   Social History  . Marital status: Married    Spouse name: N/A  . Number of children: N/A  . Years of education: N/A   Occupational History  . Not on file.   Social History Main Topics  . Smoking status: Former Smoker    Packs/day: 0.50    Years: 34.00    Types: Cigarettes  . Smokeless tobacco: Never Used  . Alcohol use No     Comment: social drink   . Drug use: No  . Sexual activity: Yes    Birth control/ protection: Surgical     Comment: hyst   Other Topics Concern  . Not on file   Social History Narrative  . No narrative on file    Maria Hester's family history includes Breast cancer (age of onset: 57) in her cousin; Breast cancer (age of onset: 57) in her maternal aunt; Colon cancer (age of onset: 46) in her maternal grandmother; Diabetes in her father and paternal grandmother; Esophageal cancer (age of onset: 96) in her maternal grandfather; Heart disease in her father, maternal grandfather, maternal grandmother, mother, paternal grandfather, and paternal grandmother; Hypertension in her mother; Kidney cancer (age of onset: 38) in her paternal grandmother; Skin cancer (age of onset: 78) in her paternal grandfather; Tuberculosis in her paternal grandfather.      Objective:    Vitals:   07/02/17 1402  BP: 100/68  Pulse: 72    Physical Exam;Well-developed white female in no acute distress, pleasant accompanied by her sister blood pressure 100/68 pulse 78, height 5 foot 5, weight 186, BMI 31.0. HEENT; nontraumatic normocephalic EOMI PERRLA sclera anicteric, alopecia Cardiovascular; regular rate and rhythm with S1-S2 no murmur rub or gallop, Pulmonary; clear bilaterally, Abdomen; soft, she has some tenderness in the subxiphoid area, and  epigastrium, no palpable mass or hepatosplenomegaly no guarding or rebound bowel sounds present, Rectal; exam not done, Extremities; no clubbing cyanosis or edema skin warm and dry, Neuropsych ;mood and affect appropriate       Assessment & Plan:   #28 51 year old white female with HER- 2 positive estrogen receptor negative breast cancer who is undergoing neoadjuvant chemotherapy which will be completed in early October. #2 several month history of epigastric and subxiphoid discomfort/pain and dyspepsia, some of which predated initiating chemotherapy. Workup with ultrasound and CT scan has confirmed gallbladder sludge but no definite cholelithiasis, and symptoms are not consistent with biliary colic. Rule out GERD with component of distal esophagitis, rule out gastropathy or peptic ulcer disease.  #3 greater #4 status post hysterectomy  Plan; Will increase Nexium to 40 mg by mouth twice daily. Patient will be scheduled for upper endoscopy with Dr. Hilarie Fredrickson. Procedure discussed in detail with patient including risks and benefits and she is agreeable to proceed. Further plans pending results of above. She is anticipating breast surgery with mastectomy versus lumpectomy and cholecystectomy per Dr.  Hoxworth later this year.  Maria Hester S Offie Waide PA-C 07/02/2017   Cc: [name], MD   Addendum: Reviewed and agree with initial management. Pyrtle, Lajuan Lines, MD

## 2017-07-02 NOTE — Patient Instructions (Addendum)
You have been scheduled for an endoscopy. Please follow written instructions given to you at your visit today. If you use inhalers (even only as needed), please bring them with you on the day of your procedure. Your physician has requested that you go to www.startemmi.com and enter the access code given to you at your visit today. This web site gives a general overview about your procedure. However, you should still follow specific instructions given to you by our office regarding your preparation for the procedure.  We sent Nexium refills to your pharmacy. [facility] [address], [place], [place].  Take 1 cap twice daily.

## 2017-07-03 ENCOUNTER — Telehealth: Payer: Self-pay | Admitting: *Deleted

## 2017-07-03 NOTE — Telephone Encounter (Signed)
Faxed the office note, dated 07-02-2017, Nicoletta Ba PA, to Dr. Adonis Housekeeper, [place]. Fax # [phone].

## 2017-07-13 ENCOUNTER — Ambulatory Visit (HOSPITAL_COMMUNITY): Payer: 59

## 2017-07-14 ENCOUNTER — Other Ambulatory Visit (HOSPITAL_BASED_OUTPATIENT_CLINIC_OR_DEPARTMENT_OTHER): Payer: 59

## 2017-07-14 ENCOUNTER — Ambulatory Visit (HOSPITAL_BASED_OUTPATIENT_CLINIC_OR_DEPARTMENT_OTHER): Payer: 59 | Admitting: Adult Health

## 2017-07-14 ENCOUNTER — Encounter: Payer: Self-pay | Admitting: *Deleted

## 2017-07-14 ENCOUNTER — Ambulatory Visit: Payer: 59

## 2017-07-14 ENCOUNTER — Encounter: Payer: Self-pay | Admitting: Adult Health

## 2017-07-14 ENCOUNTER — Telehealth: Payer: Self-pay | Admitting: Adult Health

## 2017-07-14 ENCOUNTER — Ambulatory Visit (HOSPITAL_BASED_OUTPATIENT_CLINIC_OR_DEPARTMENT_OTHER): Payer: 59

## 2017-07-14 VITALS — BP 123/51 | HR 80 | Temp 97.9°F | Resp 17 | Ht 65.0 in | Wt 191.1 lb

## 2017-07-14 DIAGNOSIS — Z171 Estrogen receptor negative status [ER-]: Secondary | ICD-10-CM

## 2017-07-14 DIAGNOSIS — C50812 Malignant neoplasm of overlapping sites of left female breast: Secondary | ICD-10-CM

## 2017-07-14 DIAGNOSIS — Z5111 Encounter for antineoplastic chemotherapy: Secondary | ICD-10-CM

## 2017-07-14 DIAGNOSIS — Z5112 Encounter for antineoplastic immunotherapy: Secondary | ICD-10-CM | POA: Diagnosis not present

## 2017-07-14 DIAGNOSIS — Z95828 Presence of other vascular implants and grafts: Secondary | ICD-10-CM

## 2017-07-14 LAB — CBC WITH DIFFERENTIAL/PLATELET
BASO%: 0.2 % (ref 0.0–2.0)
BASOS ABS: 0 10*3/uL (ref 0.0–0.1)
EOS%: 0 % (ref 0.0–7.0)
Eosinophils Absolute: 0 10*3/uL (ref 0.0–0.5)
HCT: 30.4 % — ABNORMAL LOW (ref 34.8–46.6)
HGB: 10.2 g/dL — ABNORMAL LOW (ref 11.6–15.9)
LYMPH%: 11.7 % — ABNORMAL LOW (ref 14.0–49.7)
MCH: 31.9 pg (ref 25.1–34.0)
MCHC: 33.5 g/dL (ref 31.5–36.0)
MCV: 95.3 fL (ref 79.5–101.0)
MONO#: 0.4 10*3/uL (ref 0.1–0.9)
MONO%: 3.7 % (ref 0.0–14.0)
NEUT#: 9.2 10*3/uL — ABNORMAL HIGH (ref 1.5–6.5)
NEUT%: 84.4 % — ABNORMAL HIGH (ref 38.4–76.8)
Platelets: 189 10*3/uL (ref 145–400)
RBC: 3.19 10*6/uL — AB (ref 3.70–5.45)
RDW: 18.9 % — AB (ref 11.2–14.5)
WBC: 10.9 10*3/uL — AB (ref 3.9–10.3)
lymph#: 1.3 10*3/uL (ref 0.9–3.3)

## 2017-07-14 LAB — COMPREHENSIVE METABOLIC PANEL
ALT: 30 U/L (ref 0–55)
AST: 19 U/L (ref 5–34)
Albumin: 3.4 g/dL — ABNORMAL LOW (ref 3.5–5.0)
Alkaline Phosphatase: 67 U/L (ref 40–150)
Anion Gap: 10 mEq/L (ref 3–11)
BILIRUBIN TOTAL: 0.22 mg/dL (ref 0.20–1.20)
BUN: 16.8 mg/dL (ref 7.0–26.0)
CHLORIDE: 109 meq/L (ref 98–109)
CO2: 21 meq/L — AB (ref 22–29)
CREATININE: 0.7 mg/dL (ref 0.6–1.1)
Calcium: 9.3 mg/dL (ref 8.4–10.4)
EGFR: >90 mL/min/{1.73_m2} (ref >90)
GLUCOSE: 197 mg/dL — AB (ref 70–140)
Potassium: 4.1 mEq/L (ref 3.5–5.1)
SODIUM: 140 meq/L (ref 136–145)
TOTAL PROTEIN: 6.4 g/dL (ref 6.4–8.3)

## 2017-07-14 MED ORDER — ACETAMINOPHEN 325 MG PO TABS
ORAL_TABLET | ORAL | Status: AC
  Filled 2017-07-14: qty 2

## 2017-07-14 MED ORDER — DIPHENHYDRAMINE HCL 25 MG PO CAPS
25.0000 mg | ORAL_CAPSULE | Freq: Once | ORAL | Status: AC
  Administered 2017-07-14: 25 mg via ORAL

## 2017-07-14 MED ORDER — SODIUM CHLORIDE 0.9% FLUSH
10.0000 mL | INTRAVENOUS | Status: DC | PRN
  Filled 2017-07-14: qty 10

## 2017-07-14 MED ORDER — ACETAMINOPHEN 325 MG PO TABS
650.0000 mg | ORAL_TABLET | Freq: Once | ORAL | Status: AC
  Administered 2017-07-14: 650 mg via ORAL

## 2017-07-14 MED ORDER — PALONOSETRON HCL INJECTION 0.25 MG/5ML
INTRAVENOUS | Status: AC
  Filled 2017-07-14: qty 5

## 2017-07-14 MED ORDER — DOCETAXEL CHEMO INJECTION 160 MG/16ML
75.0000 mg/m2 | Freq: Once | INTRAVENOUS | Status: AC
  Administered 2017-07-14: 150 mg via INTRAVENOUS
  Filled 2017-07-14: qty 15

## 2017-07-14 MED ORDER — DEXAMETHASONE SODIUM PHOSPHATE 10 MG/ML IJ SOLN
INTRAMUSCULAR | Status: AC
  Filled 2017-07-14: qty 1

## 2017-07-14 MED ORDER — SODIUM CHLORIDE 0.9 % IV SOLN
Freq: Once | INTRAVENOUS | Status: AC
Start: 2017-07-14 — End: 2017-07-14
  Administered 2017-07-14: 10:00:00 via INTRAVENOUS

## 2017-07-14 MED ORDER — HEPARIN SOD (PORK) LOCK FLUSH 100 UNIT/ML IV SOLN
500.0000 [IU] | Freq: Once | INTRAVENOUS | Status: AC | PRN
  Administered 2017-07-14: 500 [IU]
  Filled 2017-07-14: qty 5

## 2017-07-14 MED ORDER — PALONOSETRON HCL INJECTION 0.25 MG/5ML
0.2500 mg | Freq: Once | INTRAVENOUS | Status: AC
  Administered 2017-07-14: 0.25 mg via INTRAVENOUS

## 2017-07-14 MED ORDER — DEXAMETHASONE SODIUM PHOSPHATE 10 MG/ML IJ SOLN
10.0000 mg | Freq: Once | INTRAMUSCULAR | Status: AC
Start: 2017-07-14 — End: 2017-07-14
  Administered 2017-07-14: 10 mg via INTRAVENOUS

## 2017-07-14 MED ORDER — SODIUM CHLORIDE 0.9 % IV SOLN
420.0000 mg | Freq: Once | INTRAVENOUS | Status: AC
  Administered 2017-07-14: 420 mg via INTRAVENOUS
  Filled 2017-07-14: qty 14

## 2017-07-14 MED ORDER — TRASTUZUMAB CHEMO 150 MG IV SOLR
6.0000 mg/kg | Freq: Once | INTRAVENOUS | Status: AC
  Administered 2017-07-14: 525 mg via INTRAVENOUS
  Filled 2017-07-14: qty 25

## 2017-07-14 MED ORDER — PEGFILGRASTIM 6 MG/0.6ML ~~LOC~~ PSKT
6.0000 mg | PREFILLED_SYRINGE | Freq: Once | SUBCUTANEOUS | Status: AC
  Administered 2017-07-14: 6 mg via SUBCUTANEOUS
  Filled 2017-07-14: qty 0.6

## 2017-07-14 MED ORDER — SODIUM CHLORIDE 0.9 % IV SOLN
702.5000 mg | Freq: Once | INTRAVENOUS | Status: AC
  Administered 2017-07-14: 700 mg via INTRAVENOUS
  Filled 2017-07-14: qty 70

## 2017-07-14 MED ORDER — SODIUM CHLORIDE 0.9% FLUSH
10.0000 mL | Freq: Once | INTRAVENOUS | Status: AC
  Administered 2017-07-14: 10 mL
  Filled 2017-07-14: qty 10

## 2017-07-14 MED ORDER — DIPHENHYDRAMINE HCL 25 MG PO CAPS
ORAL_CAPSULE | ORAL | Status: AC
  Filled 2017-07-14: qty 1

## 2017-07-14 NOTE — Progress Notes (Signed)
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  She is feeling very well today.  REVIEW OF SYSTEMS: [name] is doing well today.  She denies any issues such as fevers, chills, or peripheral neuropathy.  She has MRI breast scheduled tomorrow followed by an upper endoscopy by Dr. Hilarie Fredrickson.  She is ready to get some answers about her continued epigastric/RUQ discomfort.  CT didn't give a clear answer.    BREAST CANCER HISTORY: From the original intake note:  Maria Hester herself noted a change in her left breast when looking into a mirror and then palpating a mass in the upper-outer quadrant. She brought this to medical attention and on 02/24/2017 underwent bilateral diagnostic mammography with tomography and left breast ultrasonography at the breast Center. The breast density was category C. In the upper portion of the left breast there was a broad area of distortion measuring up to 7.5 cm. On exam there was a broad firm area involving the upper outer quadrant of the left breast which was visibly protruding. By ultrasound at the 11:00 axis 8 cm from the nipple there was  an irregular hypoechoic mass measuring 1.4 cm with additiona ases at 2:00, 3 cm from the nipple and multiple other masses as described, all in the upper-outer quadrant primarily. Ultrasound of the left axilla showed at least 3 prominent lymph nodes one of which had a cortical bulge.  On 03/02/2017 the patient underwent biopsy of a left breast mass at the 11:30 o'clock position and a second mass described as upper outer quadrant as well as one of the suspicious left axillary lymph nodes. These all showed invasive ductal carcinoma, grade 3. Prognostic panel from one of the 2 masses showed the tumor to be estrogen and progesterone receptor negative, but HER-2 amplified, with a signals ratio of 8.24 and the number per cell 15.65.  Her subsequent history is as detailed below   PAST MEDICAL HISTORY: Past Medical History:  Diagnosis Date  . Breast cancer ([place])   . 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 Complication of anesthesia    pt states BP tends to drop and has a hard time waking up - pt was told by prior anesthesiologist to make sure this was made known for furture surgeries  . Family history of breast cancer   . Hiatal hernia   . Uterine fibroid     PAST SURGICAL HISTORY: Past Surgical History:  Procedure Laterality Date  . ABDOMINAL HYSTERECTOMY    . ABDOMINOPLASTY N/A 02/28/2014   Procedure: ABDOMINOPLASTY with Panniculectomy;  Surgeon: [name], MD;  Location: [facility];  Service: Gynecology;  Laterality: N/A;  . APPENDECTOMY    . BILATERAL SALPINGECTOMY Bilateral 02/28/2014  Procedure: BILATERAL SALPINGECTOMY;  Surgeon: [name], MD;  Location: [facility];  Service: Gynecology;  Laterality: Bilateral;  . CATARACT EXTRACTION, BILATERAL    . PORTACATH PLACEMENT Right 03/18/2017   Procedure: INSERTION PORT-A-CATH;  Surgeon: Excell Seltzer, MD;  Location: [facility];  Service: General;  Laterality: Right;  . reverse tubal  ligation    . SCAR REVISION N/A 02/28/2014   Procedure: SCAR REVISION;  Surgeon: [name], MD;  Location: [facility];  Service: Gynecology;  Laterality: N/A;  . SUPRACERVICAL ABDOMINAL HYSTERECTOMY N/A 02/28/2014   Procedure: HYSTERECTOMY SUPRACERVICAL ABDOMINAL;  Surgeon: [name], MD;  Location: [facility];  Service: Gynecology;  Laterality: N/A;  . TUBAL LIGATION    . WISDOM TOOTH EXTRACTION      FAMILY HISTORY Family History  Problem Relation Age of Onset  . Heart disease Mother   . Hypertension Mother   . Diabetes Father   . Heart disease Father   . Heart disease Maternal Grandmother   . Colon cancer Maternal Grandmother 5       died in her 55's  . Heart disease Maternal Grandfather   . Esophageal cancer Maternal Grandfather 50       died at 26  . Diabetes Paternal Grandmother   . Heart disease Paternal Grandmother   . Kidney cancer Paternal Grandmother 50       died in her 45's  . Tuberculosis Paternal Grandfather   . Heart disease Paternal Grandfather   . Skin cancer Paternal Grandfather 27       died at 23, patient not sure if it was melanoma, BCC, Squamous, etc.  . Breast cancer Maternal Aunt 50       died at 55  . Breast cancer Cousin 3       she is now in her 81's  The patient's father died from heart disease at age 28. The patient's mother is living at age 70. The patient has one brother, 2 sisters. On the mother's side and aunt had breast cancer at age 9 and her daughter had breast cancer in her early 8s. There is also a grandfather with esophageal cancer and a grandmother with colon cancer. On the paternal side there is a grandmother with kidney cancer at an early age and a grandfather with melanoma at an early age.  GYNECOLOGIC HISTORY:  Patient's last menstrual period was 02/11/2014.  Menarche age 56, first live birth age 74, the patient is Maria P2. She underwent hysterectomy without salpingo-oophorectomy May 2016. She did not use hormone replacement. She  did use oral contraceptives approximately 10 years in the 1980s.  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 Her husband Fish farm manager in the same [place], which is a 10% aviation enrollment. Daughter Lorenda Hatchet is a hairstylist in [place] and son Chip Boer is a Physiological scientist in [place] the patient has 2 grandsons and one granddaughter. She attends a [employer]    ADVANCED DIRECTIVES: The patient has completed advanced directives and will have the motorized at the next visit   HEALTH MAINTENANCE: Social History  Substance Use Topics  . Smoking status: Former Smoker    Packs/day: 0.50    Years: 34.00    Types: Cigarettes  . Smokeless tobacco: Never Used  . Alcohol use No     Comment: social drink      Colonoscopy:No  PAP: 03/04/2017  Bone density: No   No Known Allergies  Current Outpatient Prescriptions  Medication Sig Dispense Refill  . ALPRAZolam (XANAX) 0.5 MG tablet TAKE HALF TO 1 TABLET BY MOUTH EVERY 8 HOURS AS NEEDED FOR ANXIETY  2  . cholestyramine (QUESTRAN) 4 GM/DOSE powder Take 1 packet (4 g total) by mouth 2 (two) times daily. 378 g 12  . dexamethasone (DECADRON) 4 MG tablet TAKE 2 TABLETS BY MOUTH TWICE DAILY. START DAY BEFORE TAZOTERE THEN AGAIN THE DAY AFTER CHEMO 30 tablet 1  . esomeprazole (NEXIUM) 40 MG capsule Take 1 capsule (40 mg total) by mouth 2 (two) times daily before a meal. 60 capsule 2  . lidocaine-prilocaine (EMLA) cream Apply to affected area once 30 g 3  . LORazepam (ATIVAN) 0.5 MG tablet TAKE 1 TABLET BY MOUTH AT BEDTIME AS NEEDED FOR NAUSEA AND VOMITING 30 tablet 0  . metroNIDAZOLE (METROGEL) 1 % gel Apply topically daily. 45 g 0  . ondansetron (ZOFRAN) 8 MG tablet Take 1 tablet (8 mg total) by mouth every 8 (eight) hours as needed for nausea or vomiting. 30 tablet 3  . prochlorperazine (COMPAZINE) 10 MG tablet TAKE 1 TABLET BY MOUTH EVERY 6 HOURS AS NEEDED FOR  NAUSEA AND VOMITING 30 tablet 1   No current facility-administered medications for this visit.    Facility-Administered Medications Ordered in Other Visits  Medication Dose Route Frequency Provider Last Rate Last Dose  . heparin lock flush 100 unit/mL  500 Units Intracatheter Once Magrinat, Virgie Dad, MD      . sodium chloride flush (NS) 0.9 % injection 10 mL  10 mL Intracatheter Once Magrinat, Virgie Dad, MD      . sodium chloride flush (NS) 0.9 % injection 10 mL  10 mL Intracatheter Once Magrinat, Virgie Dad, MD        OBJECTIVE:  Vitals:   07/14/17 0919  BP: (!) 123/51  Pulse: 80  Resp: 17  Temp: 97.9 F (36.6 C)  SpO2: 99%     Body mass index is 31.8 kg/m.    ECOG FS:1 - Symptomatic but completely ambulatory GENERAL: Patient is a well appearing female in no acute distress HEENT:  Sclerae anicteric. PERRL. Oropharynx clear and moist. No ulcerations or evidence of oropharyngeal candidiasis. Neck is supple.  NODES:  No cervical, supraclavicular, or axillary lymphadenopathy palpated.  BREAST EXAM:  Deferred. LUNGS:  Clear to auscultation bilaterally.  No wheezes or rhonchi. HEART:  Regular rate and rhythm. No murmur appreciated. ABDOMEN:  Soft, NT, ND  Positive, normoactive bowel sounds. No organomegaly palpated. MSK:  No focal spinal tenderness to palpation. Full range of motion bilaterally in the upper extremities. EXTREMITIES:  No peripheral edema.   SKIN:  Clear with no obvious rashes or skin changes. No nail dyscrasia. NEURO:  Nonfocal. Well oriented.  Appropriate affect.    LAB RESULTS:  CMP     Component Value Date/Time   NA 135 (L) 06/30/2017 1214   K 4.2 06/30/2017 1214   CL 106 03/01/2014 0607   CO2 24 06/30/2017 1214   GLUCOSE 137 06/30/2017 1214   BUN 15.3 06/30/2017 1214   CREATININE 1.0 06/30/2017 1214   CALCIUM 9.5 06/30/2017 1214   PROT 6.6 06/30/2017 1214   ALBUMIN 3.6 06/30/2017 1214   AST 18 06/30/2017 1214   ALT 21 06/30/2017 1214   ALKPHOS 99  06/30/2017 1214   BILITOT 0.33 06/30/2017 1214   GFRNONAA >90 03/01/2014 0607   GFRAA >90 03/01/2014 0607    No results found for: TOTALPROTELP, ALBUMINELP, A1GS, A2GS, BETS, BETA2SER,  GAMS, MSPIKE, SPEI  No results found for: Nils Pyle, [facility]  Lab Results  Component Value Date   WBC 10.9 (H) 07/14/2017   NEUTROABS 9.2 (H) 07/14/2017   HGB 10.2 (L) 07/14/2017   HCT 30.4 (L) 07/14/2017   MCV 95.3 07/14/2017   PLT 189 07/14/2017      Chemistry      Component Value Date/Time   NA 135 (L) 06/30/2017 1214   K 4.2 06/30/2017 1214   CL 106 03/01/2014 0607   CO2 24 06/30/2017 1214   BUN 15.3 06/30/2017 1214   CREATININE 1.0 06/30/2017 1214      Component Value Date/Time   CALCIUM 9.5 06/30/2017 1214   ALKPHOS 99 06/30/2017 1214   AST 18 06/30/2017 1214   ALT 21 06/30/2017 1214   BILITOT 0.33 06/30/2017 1214       No results found for: LABCA2  No components found for: [id number]  No results for input(s): INR in the last 168 hours.  Urinalysis    Component Value Date/Time   COLORURINE YELLOW 02/23/2014 1004   APPEARANCEUR CLEAR 02/23/2014 1004   LABSPEC 1.025 02/23/2014 1004   PHURINE 5.5 02/23/2014 1004   GLUCOSEU NEGATIVE 02/23/2014 1004   HGBUR NEGATIVE 02/23/2014 1004   BILIRUBINUR NEGATIVE 02/23/2014 1004   KETONESUR NEGATIVE 02/23/2014 1004   PROTEINUR NEGATIVE 02/23/2014 1004   UROBILINOGEN 0.2 02/23/2014 1004   NITRITE NEGATIVE 02/23/2014 1004   LEUKOCYTESUR NEGATIVE 02/23/2014 1004     STUDIES: Ct Chest W Contrast  Result Date: 06/25/2017 CLINICAL DATA:  Left breast ca dx' d 02/2017 with ongoing chemo C/o anterior chest wall pain x 2-3 mos. Former smoker EXAM: CT CHEST WITH CONTRAST TECHNIQUE: Multidetector CT imaging of the chest was performed during intravenous contrast administration. CONTRAST:  14m ISOVUE-300 IOPAMIDOL (ISOVUE-300) INJECTION 61% COMPARISON:  03/20/2017 bone scan and chest CT FINDINGS: Cardiovascular: No  significant vascular findings. Normal heart size. No pericardial effusion. Right-sided Port-A-Cath tip in the superior vena cava. Mediastinum/Nodes: The visualized portion of the thyroid gland has a normal appearance. No mediastinal, hilar, or axillary adenopathy. Small hiatal hernia is present. Lungs/Pleura: Lungs are clear. No pleural effusion or pneumothorax. Upper Abdomen: A 6 mm low-attenuation lesion is identified at the dome of the right hepatic lobe, stable in appearance and favored to be benign. Musculoskeletal: No chest wall abnormality. No acute or significant osseous findings. Normal appearance of the sternum. IMPRESSION: 1. Normal appearance of the chest. 2. Right-sided Port-A-Cath tip to the superior vena cava. Electronically Signed   By: ENolon NationsM.D.   On: 06/25/2017 13:35   Ct Abdomen Pelvis W Contrast  Result Date: 06/26/2017 CLINICAL DATA:  Epigastric abdominal pain after eating. EXAM: CT ABDOMEN AND PELVIS WITH CONTRAST TECHNIQUE: Multidetector CT imaging of the abdomen and pelvis was performed using the standard protocol following bolus administration of intravenous contrast. CONTRAST:  1054mISOVUE-300 IOPAMIDOL (ISOVUE-300) INJECTION 61% COMPARISON:  Abdominal ultrasound 06/12/2017. FINDINGS: Lower chest: The lung bases are clear of acute process. No pleural effusion or pulmonary lesions. The heart is normal in size. No pericardial effusion. The distal esophagus and aorta are unremarkable. Small hiatal hernia noted. Hepatobiliary: No focal hepatic lesions or intrahepatic biliary dilatation. The gallbladder demonstrates dependent layering higher attenuation tear which could be sludge. No definite gallstones were seen on the ultrasound. No findings for acute cholecystitis. No common bile duct dilatation. No common bile duct dilatation. Pancreas: No mass, inflammation or ductal dilatation. Spleen: Normal size.  No focal lesions. Adrenals/Urinary Tract: The  adrenal glands and kidneys  are unremarkable. No renal, ureteral or bladder calculi or mass. Stomach/Bowel: The stomach, duodenum, small bowel and colon are grossly normal without oral contrast. No acute inflammatory changes, mass lesions or obstructive findings. The terminal ileum is normal. The appendix is surgically absent. Vascular/Lymphatic: The aorta is normal in caliber. No dissection. Scattered atherosclerotic calcifications. The branch vessels are patent. The major venous structures are patent. No mesenteric or retroperitoneal mass or adenopathy. Small scattered lymph nodes are noted. Reproductive: Status post supracervical hysterectomy. Other: No pelvic mass or adenopathy. No free pelvic fluid collections. No inguinal mass or adenopathy. No abdominal wall hernia or subcutaneous lesions. Musculoskeletal: No significant bony findings. Degenerative changes at the pubic symphysis. IMPRESSION: 1. Layering high attenuation material in the gallbladder likely gallbladder sludge. No CT findings to suggest acute cholecystitis. 2. No acute abdominal/pelvic findings, mass lesions or adenopathy. 3. Small hiatal hernia. 4. Age advanced atherosclerotic calcifications involving the distal aorta and iliac arteries. Electronically Signed   By: Marijo Sanes M.D.   On: 06/26/2017 15:15    ELIGIBLE FOR AVAILABLE RESEARCH PROTOCOL: no  ASSESSMENT: 51 y.o. [place], [place] woman status post left breast overlapping sites biopsy 03/02/2017 for a clinical T3 N1-2, stage IIIA invasive ductal carcinoma, grade 3, estrogen and progesterone receptor negative, HER-2 amplified, with an MIB-1 of 35%  (1) genetics testing07/24/2018 through the Hereditary Gene Panel offered by [facility] found no deleterious mutations in APC, ATM, AXIN2, BARD1, BMPR1A, BRCA1, BRCA2, BRIP1, CDH1, CDKN2A (p14ARF), CDKN2A (p16INK4a), CHEK2, CTNNA1, DICER1, EPCAM (Deletion/duplication testing only), GREM1 (promoter region deletion/duplication testing only), KIT, MEN1, MLH1, MSH2, MSH3,  MSH6, MUTYH, NBN, NF1, NHTL1, PALB2, PDGFRA, PMS2, POLD1, POLE, PTEN, RAD50, RAD51C, RAD51D, SDHB, SDHC, SDHD, SMAD4, SMARCA4. STK11, TP53, TSC1, TSC2, and VHL.  The following genes were evaluated for sequence changes only: SDHA and HOXB13 c.251G>A variant only.  (2) neoadjuvant chemotherapy consisting of carboplatin and Docetaxel given with trastuzumab and Pertuzumab every 21 days for 6 cycles, from 03/31/2017-07/14/2017  (a) Pertuzumab stopped after cycle 1 secondary to side effects     (b) Pertuzumab resumed cycle 3, not at loading dose.  (3) trastuzumab will be continued to complete 6 months  (a) echocardiogram 06/30/2017 showed an ejection fraction of 55%   (4) definitive surgery to follow chemotherapy  (5) adjuvant radiation to follow surgery   PLAN: Talonda is doing well today.  I reviewed her CBC with her today and it was stable.  She will proceed with treatment today (as long as CMET is normal--currently pending).  She will proceed with MRI breasts and upper endoscopy tomorrow, and f/u with Dr. Excell Seltzer on Thursday.  Bianco's mom asked if Dr. Excell Seltzer and Dr. Jana Hakim will discuss her MRI results and plan.  I assured her this would be the case, and additionally next Wednesday Marti would be discussed at our weekly tumor board meeting amongst the group of surgeons, radiation oncologists, and medical oncologists.  They were happy about this.    Kortney will return in one week for follow up with Dr. Jana Hakim.  She knows to call for any questions or concerns prior to her next appointment.    A total of (20) minutes of face-to-face time was spent with this patient with greater than 50% of that time in counseling and care-coordination.  Gardenia Phlegm, NP  Medical Oncology and Hematology [facility] [address] [place], [place] [address] Tel. [phone]    Fax. [phone]

## 2017-07-14 NOTE — Patient Instructions (Signed)
[facility] Discharge Instructions for Patients Receiving Chemotherapy  Today you received the following chemotherapy agents Herceptin, Perjeta, Taxotere, Carboplatin To help prevent nausea and vomiting after your treatment, we encourage you to take your nausea medication as needed   If you develop nausea and vomiting that is not controlled by your nausea medication, call the clinic.   BELOW ARE SYMPTOMS THAT SHOULD BE REPORTED IMMEDIATELY:  *FEVER GREATER THAN 100.5 F  *CHILLS WITH OR WITHOUT FEVER  NAUSEA AND VOMITING THAT IS NOT CONTROLLED WITH YOUR NAUSEA MEDICATION  *UNUSUAL SHORTNESS OF BREATH  *UNUSUAL BRUISING OR BLEEDING  TENDERNESS IN MOUTH AND THROAT WITH OR WITHOUT PRESENCE OF ULCERS  *URINARY PROBLEMS  *BOWEL PROBLEMS  UNUSUAL RASH Items with * indicate a potential emergency and should be followed up as soon as possible.  Feel free to call the clinic should you have any questions or concerns. The clinic phone number is [phone].  Please show the [place] at check-in to the Emergency Department and triage nurse.

## 2017-07-14 NOTE — Telephone Encounter (Signed)
No 10/2 los.   

## 2017-07-14 NOTE — Patient Instructions (Signed)
Implanted Port Home Guide An implanted port is a type of central line that is placed under the skin. Central lines are used to provide IV access when treatment or nutrition needs to be given through a person's veins. Implanted ports are used for long-term IV access. An implanted port may be placed because:  You need IV medicine that would be irritating to the small veins in your hands or arms.  You need long-term IV medicines, such as antibiotics.  You need IV nutrition for a long period.  You need frequent blood draws for lab tests.  You need dialysis.  Implanted ports are usually placed in the chest area, but they can also be placed in the upper arm, the abdomen, or the leg. An implanted port has two main parts:  Reservoir. The reservoir is round and will appear as a small, raised area under your skin. The reservoir is the part where a needle is inserted to give medicines or draw blood.  Catheter. The catheter is a thin, flexible tube that extends from the reservoir. The catheter is placed into a large vein. Medicine that is inserted into the reservoir goes into the catheter and then into the vein.  How will I care for my incision site? Do not get the incision site wet. Bathe or shower as directed by your health care provider. How is my port accessed? Special steps must be taken to access the port:  Before the port is accessed, a numbing cream can be placed on the skin. This helps numb the skin over the port site.  Your health care provider uses a sterile technique to access the port. ? Your health care provider must put on a mask and sterile gloves. ? The skin over your port is cleaned carefully with an antiseptic and allowed to dry. ? The port is gently pinched between sterile gloves, and a needle is inserted into the port.  Only "non-coring" port needles should be used to access the port. Once the port is accessed, a blood return should be checked. This helps ensure that the port  is in the vein and is not clogged.  If your port needs to remain accessed for a constant infusion, a clear (transparent) bandage will be placed over the needle site. The bandage and needle will need to be changed every week, or as directed by your health care provider.  Keep the bandage covering the needle clean and dry. Do not get it wet. Follow your health care provider's instructions on how to take a shower or bath while the port is accessed.  If your port does not need to stay accessed, no bandage is needed over the port.  What is flushing? Flushing helps keep the port from getting clogged. Follow your health care provider's instructions on how and when to flush the port. Ports are usually flushed with saline solution or a medicine called heparin. The need for flushing will depend on how the port is used.  If the port is used for intermittent medicines or blood draws, the port will need to be flushed: ? After medicines have been given. ? After blood has been drawn. ? As part of routine maintenance.  If a constant infusion is running, the port may not need to be flushed.  How long will my port stay implanted? The port can stay in for as long as your health care provider thinks it is needed. When it is time for the port to come out, surgery will be   done to remove it. The procedure is similar to the one performed when the port was put in. When should I seek immediate medical care? When you have an implanted port, you should seek immediate medical care if:  You notice a bad smell coming from the incision site.  You have swelling, redness, or drainage at the incision site.  You have more swelling or pain at the port site or the surrounding area.  You have a fever that is not controlled with medicine.  This information is not intended to replace advice given to you by your health care provider. Make sure you discuss any questions you have with your health care provider. Document  Released: 09/29/2005 Document Revised: 03/06/2016 Document Reviewed: 06/06/2013 Elsevier Interactive Patient Education  2017 Elsevier Inc.  

## 2017-07-15 ENCOUNTER — Ambulatory Visit (AMBULATORY_SURGERY_CENTER): Payer: 59 | Admitting: Internal Medicine

## 2017-07-15 ENCOUNTER — Other Ambulatory Visit: Payer: Self-pay

## 2017-07-15 ENCOUNTER — Ambulatory Visit (HOSPITAL_COMMUNITY)
Admission: RE | Admit: 2017-07-15 | Discharge: 2017-07-15 | Disposition: A | Discharge status: Home / Self Care | Payer: 59 | Source: Ambulatory Visit | Attending: Adult Health | Admitting: Adult Health

## 2017-07-15 ENCOUNTER — Ambulatory Visit (HOSPITAL_BASED_OUTPATIENT_CLINIC_OR_DEPARTMENT_OTHER): Payer: 59

## 2017-07-15 ENCOUNTER — Telehealth: Payer: Self-pay

## 2017-07-15 ENCOUNTER — Encounter: Payer: Self-pay | Admitting: Internal Medicine

## 2017-07-15 VITALS — BP 116/57 | HR 44 | Temp 98.2°F | Resp 14 | Ht 65.0 in | Wt 186.0 lb

## 2017-07-15 DIAGNOSIS — K2210 Ulcer of esophagus without bleeding: Secondary | ICD-10-CM

## 2017-07-15 DIAGNOSIS — R1013 Epigastric pain: Secondary | ICD-10-CM

## 2017-07-15 DIAGNOSIS — C50812 Malignant neoplasm of overlapping sites of left female breast: Secondary | ICD-10-CM

## 2017-07-15 DIAGNOSIS — Z9221 Personal history of antineoplastic chemotherapy: Secondary | ICD-10-CM | POA: Insufficient documentation

## 2017-07-15 DIAGNOSIS — K2970 Gastritis, unspecified, without bleeding: Secondary | ICD-10-CM

## 2017-07-15 DIAGNOSIS — K3189 Other diseases of stomach and duodenum: Secondary | ICD-10-CM | POA: Diagnosis not present

## 2017-07-15 DIAGNOSIS — Z171 Estrogen receptor negative status [ER-]: Secondary | ICD-10-CM | POA: Insufficient documentation

## 2017-07-15 DIAGNOSIS — Z5189 Encounter for other specified aftercare: Secondary | ICD-10-CM | POA: Diagnosis not present

## 2017-07-15 DIAGNOSIS — K2990 Gastroduodenitis, unspecified, without bleeding: Secondary | ICD-10-CM

## 2017-07-15 MED ORDER — SUCRALFATE 1 GM/10ML PO SUSP: 1.0000 g | Freq: Three times a day (TID) | ORAL | 2 refills | Status: DC

## 2017-07-15 MED ORDER — GADOBENATE DIMEGLUMINE 529 MG/ML IV SOLN
20.0000 mL | Freq: Once | INTRAVENOUS | Status: AC | PRN
  Administered 2017-07-15: 18 mL via INTRAVENOUS

## 2017-07-15 MED ORDER — SODIUM CHLORIDE 0.9 % IV SOLN: 500.0000 mL | INTRAVENOUS | Status: DC

## 2017-07-15 MED ORDER — PEGFILGRASTIM INJECTION 6 MG/0.6ML ~~LOC~~
6.0000 mg | PREFILLED_SYRINGE | Freq: Once | SUBCUTANEOUS | Status: AC
  Administered 2017-07-15: 6 mg via SUBCUTANEOUS

## 2017-07-15 NOTE — Telephone Encounter (Signed)
Called and left a detailed voice message concerning injection today @ 4pm. Per 10/3 sch message

## 2017-07-15 NOTE — Progress Notes (Signed)
Pt had to take Onpro off permaturely d/t MRI and other procedure scheduled today. Onpro given to [place] for pt reimbursement. Neulasta injection administered this afternoon.

## 2017-07-15 NOTE — Telephone Encounter (Signed)
Pt's sister called stating that pt is scheduled for MRI this morning but has an Onpro device attached.  Pt does not want to reschedule MRI.  Asking if Onpro can be removed.  This RN informed pt's sister that Onpro can be removed now and pt can be scheduled for Neulasta injection this afternoon.  Per documentation, Onpro was applied at 1433 yesterday afternoon.  Msg sent to scheduling dept to schedule injection appt for 4pm or 4:30pm today.  Pt's sister instructed to bring Onpro device with them when they return for injection appointment this afternoon so the nurse can verify that the pt did not receive the medication via the Onpro device.  Pt's sister verbalizes understanding.

## 2017-07-15 NOTE — Patient Instructions (Addendum)
YOU HAD AN ENDOSCOPIC PROCEDURE TODAY AT [facility]:   Refer to the procedure report that was given to you for any specific questions about what was found during the examination.  If the procedure report does not answer your questions, please call your gastroenterologist to clarify.  If you requested that your care partner not be given the details of your procedure findings, then the procedure report has been included in a sealed envelope for you to review at your convenience later.  YOU SHOULD EXPECT: Some feelings of bloating in the abdomen. Passage of more gas than usual.  Walking can help get rid of the air that was put into your GI tract during the procedure and reduce the bloating. If you had a lower endoscopy (such as a colonoscopy or flexible sigmoidoscopy) you may notice spotting of blood in your stool or on the toilet paper. If you underwent a bowel prep for your procedure, you may not have a normal bowel movement for a few days.  Please Note:  You might notice some irritation and congestion in your nose or some drainage.  This is from the oxygen used during your procedure.  There is no need for concern and it should clear up in a day or so.  SYMPTOMS TO REPORT IMMEDIATELY:   Following upper endoscopy (EGD)  Vomiting of blood or coffee ground material  New chest pain or pain under the shoulder blades  Painful or persistently difficult swallowing  New shortness of breath  Fever of 100F or higher  Black, tarry-looking stools  For urgent or emergent issues, a gastroenterologist can be reached at any hour by calling [phone].   DIET:  Follow a soft diet. Advance as tolerated.  Drink plenty of fluids but you should avoid alcoholic beverages for 24 hours.  MEDICATIONS: Continue present medications including Nexium 40 mg twice daily before 1st and last meal. Add Sucralfate suspension 1 gram three times daily before meals and at bedtime.  Please see handouts given  to you by your recovery nurse.  ACTIVITY:  You should plan to take it easy for the rest of today and you should NOT DRIVE or use heavy machinery until tomorrow (because of the sedation medicines used during the test).    FOLLOW UP: Our staff will call the number listed on your records the next business day following your procedure to check on you and address any questions or concerns that you may have regarding the information given to you following your procedure. If we do not reach you, we will leave a message.  However, if you are feeling well and you are not experiencing any problems, there is no need to return our call.  We will assume that you have returned to your regular daily activities without incident.  If any biopsies were taken you will be contacted by phone or by letter within the next 1-3 weeks.  Please call us at [phone] if you have not heard about the biopsies in 3 weeks.    SIGNATURES/CONFIDENTIALITY: You and/or your care partner have signed paperwork which will be entered into your electronic medical record.  These signatures attest to the fact that that the information above on your After Visit Summary has been reviewed and is understood.  Full responsibility of the confidentiality of this discharge information lies with you and/or your care-partner.

## 2017-07-15 NOTE — Progress Notes (Signed)
To recovery, report to RN, VSS. 

## 2017-07-15 NOTE — Progress Notes (Signed)
Called to room to assist during endoscopic procedure.  Patient ID and intended procedure confirmed with present staff. Received instructions for my participation in the procedure from the performing physician.  

## 2017-07-15 NOTE — Op Note (Signed)
[facility] Patient Name: Maria Hester Procedure Date: 07/15/2017 4:00 PM MRN: [id number] Endoscopist: Jerene Bears , MD Age: 51 Referring MD:  Date of Birth: 11/14/1965 Gender: Female Account #: [id number] Procedure:                Upper GI endoscopy Indications:              Epigastric abdominal pain, Odynophagia Medicines:                Monitored Anesthesia Care Procedure:                Pre-Anesthesia Assessment:                           - Prior to the procedure, a History and Physical                            was performed, and patient medications and                            allergies were reviewed. The patient's tolerance of                            previous anesthesia was also reviewed. The risks                            and benefits of the procedure and the sedation                            options and risks were discussed with the patient.                            All questions were answered, and informed consent                            was obtained. Prior Anticoagulants: The patient has                            taken no previous anticoagulant or antiplatelet                            agents. ASA Grade Assessment: III - A patient with                            severe systemic disease. After reviewing the risks                            and benefits, the patient was deemed in                            satisfactory condition to undergo the procedure.                           After obtaining informed consent, the endoscope was  passed under direct vision. Throughout the                            procedure, the patient's blood pressure, pulse, and                            oxygen saturations were monitored continuously. The                            Model GIF-[id number] scope was introduced                            through the mouth, and advanced to the second part                            of duodenum.  The upper GI endoscopy was                            accomplished without difficulty. The patient                            tolerated the procedure well. Scope In: Scope Out: Findings:                 LA Grade D (one or more mucosal breaks involving at                            least 75% of esophageal circumference) esophagitis                            was found in the distal esophagus. Possible related                            to reflux, but potentially chemotherapy induced.                            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                            Biopsies were taken with a cold forceps for                            histology, also to exclude viral esophagitis. (GE                            junction and distal esophagus).                           A 5 cm hiatal hernia was found. The proximal extent  of the gastric folds (end of tubular esophagus) was                            35 cm from the incisors. The hiatal narrowing was                            40 cm from the incisors.                           Localized mild inflammation characterized by                            erythema was found in the gastric antrum. Biopsies                            were taken with a cold forceps for histology and                            Helicobacter pylori testing.                           The exam of the stomach was otherwise normal.                           The examined duodenum was normal. Complications:            No immediate complications. Estimated Blood Loss:     Estimated blood loss was minimal. Impression:               - LA Grade D acute and erosive esophagitis.                            Biopsied.                           - 5 cm hiatal hernia.                           - Mild gastritis. Biopsied.                           - Normal examined  duodenum. Recommendation:           - Patient has a contact number available for                            emergencies. The signs and symptoms of potential                            delayed complications were discussed with the                            patient. Return to normal activities tomorrow.                            Written discharge instructions were provided to the  patient.                           - Soft diet.                           - Continue present medications including Nexium 40                            mg twice daily before 1st and last meal. Add                            sucralfate suspension 1 g three times daily before                            mals and at bedtime.                           - Await pathology results. Jerene Bears, MD 07/15/2017 4:24:01 PM This report has been signed electronically.

## 2017-07-16 ENCOUNTER — Other Ambulatory Visit: Payer: Self-pay | Admitting: General Surgery

## 2017-07-16 ENCOUNTER — Telehealth: Payer: Self-pay

## 2017-07-16 ENCOUNTER — Ambulatory Visit: Payer: Self-pay | Admitting: General Surgery

## 2017-07-16 DIAGNOSIS — C50912 Malignant neoplasm of unspecified site of left female breast: Secondary | ICD-10-CM

## 2017-07-16 NOTE — Telephone Encounter (Signed)
Attempted to reach pt. With follow up call following endoscopic procedure 07/15/2017.  Will attempt to reach pt. Again later today.

## 2017-07-16 NOTE — Telephone Encounter (Signed)
Attempted to reach pt. With follow up call following endoscopic procedure 07/15/2017.   Unable to LM on pt.'s contact no.

## 2017-07-20 ENCOUNTER — Ambulatory Visit (HOSPITAL_BASED_OUTPATIENT_CLINIC_OR_DEPARTMENT_OTHER): Payer: 59 | Admitting: Oncology

## 2017-07-20 ENCOUNTER — Other Ambulatory Visit (HOSPITAL_BASED_OUTPATIENT_CLINIC_OR_DEPARTMENT_OTHER): Payer: 59

## 2017-07-20 ENCOUNTER — Telehealth: Payer: Self-pay | Admitting: Oncology

## 2017-07-20 VITALS — BP 107/56 | HR 72 | Temp 98.0°F | Resp 18 | Ht 65.0 in | Wt 184.8 lb

## 2017-07-20 DIAGNOSIS — Z171 Estrogen receptor negative status [ER-]: Secondary | ICD-10-CM

## 2017-07-20 DIAGNOSIS — C50812 Malignant neoplasm of overlapping sites of left female breast: Secondary | ICD-10-CM

## 2017-07-20 LAB — COMPREHENSIVE METABOLIC PANEL
ALT: 33 U/L (ref 0–55)
AST: 14 U/L (ref 5–34)
Albumin: 3.6 g/dL (ref 3.5–5.0)
Alkaline Phosphatase: 86 U/L (ref 40–150)
Anion Gap: 8 mEq/L (ref 3–11)
BILIRUBIN TOTAL: 0.34 mg/dL (ref 0.20–1.20)
BUN: 28.1 mg/dL — AB (ref 7.0–26.0)
CALCIUM: 8.9 mg/dL (ref 8.4–10.4)
CO2: 29 mEq/L (ref 22–29)
Chloride: 104 mEq/L (ref 98–109)
Creatinine: 0.8 mg/dL (ref 0.6–1.1)
EGFR: 85 mL/min/{1.73_m2} — AB (ref >90)
Glucose: 118 mg/dl (ref 70–140)
POTASSIUM: 3.3 meq/L — AB (ref 3.5–5.1)
Sodium: 140 mEq/L (ref 136–145)
TOTAL PROTEIN: 6.1 g/dL — AB (ref 6.4–8.3)

## 2017-07-20 LAB — CBC WITH DIFFERENTIAL/PLATELET
BASO%: 0 % (ref 0.0–2.0)
Basophils Absolute: 0 10*3/uL (ref 0.0–0.1)
EOS%: 0 % (ref 0.0–7.0)
Eosinophils Absolute: 0 10*3/uL (ref 0.0–0.5)
HCT: 29 % — ABNORMAL LOW (ref 34.8–46.6)
HEMOGLOBIN: 9.3 g/dL — AB (ref 11.6–15.9)
LYMPH#: 3.4 10*3/uL — AB (ref 0.9–3.3)
LYMPH%: 68.9 % — AB (ref 14.0–49.7)
MCH: 31.4 pg (ref 25.1–34.0)
MCHC: 32.1 g/dL (ref 31.5–36.0)
MCV: 98 fL (ref 79.5–101.0)
MONO#: 0.3 10*3/uL (ref 0.1–0.9)
MONO%: 6.8 % (ref 0.0–14.0)
NEUT#: 1.2 10*3/uL — ABNORMAL LOW (ref 1.5–6.5)
NEUT%: 24.3 % — AB (ref 38.4–76.8)
Platelets: 113 10*3/uL — ABNORMAL LOW (ref 145–400)
RBC: 2.96 10*6/uL — AB (ref 3.70–5.45)
RDW: 16.3 % — ABNORMAL HIGH (ref 11.2–14.5)
WBC: 4.9 10*3/uL (ref 3.9–10.3)
nRBC: 0 % (ref 0–0)

## 2017-07-20 NOTE — Telephone Encounter (Signed)
Gave patient avs report and appointments for October thru January. Per 10/8 los herc 10/23, then 11/20 and q21 days.

## 2017-07-20 NOTE — Progress Notes (Signed)
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 She completed her sixth cycle of chemotherapy 07/14/2017, and proceeded to a restaging breast MRI which shows a complete radiologic response. This is very encouraging.   REVIEW OF SYSTEMS: Maria Hester still has some taste alteration, and of course her hair has not begun to grow back, but she is feeling a little bit stronger. She has not begun an exercise program yet. She knows that she will need to do this if she is going to be in shape for her upcoming surgery, which has been scheduled for November 7. She denies unusual headaches, visual changes, nausea, vomiting, or dizziness. There has been no unusual cough, phlegm production, or pleurisy. This been no change in bowel or bladder habits. She denies unexplained fatigue or unexplained weight loss, bleeding, rash, or fever. A detailed review of systems was otherwise negative.  BREAST CANCER HISTORY: From the original intake note:  Maria Hester herself noted a change in her left breast when looking into a mirror and then palpating a mass in the upper-outer quadrant. She brought this to medical attention and on 02/24/2017 underwent  bilateral diagnostic mammography with tomography and left breast ultrasonography at the breast Center. The breast density was category C. In the upper portion of the left breast there was a broad area of distortion measuring up to 7.5 cm. On exam there was a broad firm area involving the upper outer quadrant of the left breast which was visibly protruding. By ultrasound at the 11:00 axis 8 cm from the nipple there was an irregular hypoechoic mass measuring 1.4 cm with additiona ases at 2:00, 3 cm from the nipple and multiple other masses as described, all in the upper-outer quadrant primarily. Ultrasound of the left axilla showed at least 3 prominent lymph nodes one of which had a cortical bulge.  On 03/02/2017 the patient underwent biopsy of a left breast mass at the 11:30 o'clock position and a second mass described as upper outer quadrant as well as one of the suspicious left axillary lymph nodes. These all showed invasive ductal carcinoma, grade 3. Prognostic panel from one of the 2 masses showed the tumor to be estrogen and progesterone receptor negative, but HER-2 amplified, with a signals ratio of 8.24 and the number per cell 15.65.  Her subsequent history is as detailed below   PAST MEDICAL HISTORY: Past Medical History:  Diagnosis Date  . Breast cancer ([place])   . 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 Cataracts, bilateral   . Complication of anesthesia    pt states BP tends to drop and has a hard time waking up - pt was told by prior anesthesiologist to make sure this was made known for furture surgeries  . Family history of breast cancer   . Hiatal hernia   .  Hyperlipidemia   . Uterine fibroid     PAST SURGICAL HISTORY: Past Surgical History:  Procedure Laterality Date  . ABDOMINAL HYSTERECTOMY    . ABDOMINOPLASTY N/A 02/28/2014   Procedure: ABDOMINOPLASTY with Panniculectomy;  Surgeon: [name], MD;  Location: [facility];  Service: Gynecology;  Laterality: N/A;  . APPENDECTOMY    . BILATERAL SALPINGECTOMY Bilateral 02/28/2014   Procedure: BILATERAL SALPINGECTOMY;  Surgeon: [name], MD;  Location: [facility];  Service: Gynecology;  Laterality: Bilateral;  . CATARACT EXTRACTION, BILATERAL    . PORTACATH PLACEMENT Right 03/18/2017   Procedure: INSERTION PORT-A-CATH;  Surgeon: Excell Seltzer, MD;  Location: [facility];  Service: General;  Laterality: Right;  . reverse tubal ligation    . SCAR REVISION N/A 02/28/2014   Procedure: SCAR REVISION;  Surgeon: [name], MD;  Location: [facility];  Service: Gynecology;  Laterality: N/A;  . SUPRACERVICAL ABDOMINAL HYSTERECTOMY N/A 02/28/2014   Procedure: HYSTERECTOMY SUPRACERVICAL ABDOMINAL;  Surgeon: [name], MD;  Location: [facility];  Service: Gynecology;  Laterality: N/A;  . TUBAL LIGATION    . WISDOM TOOTH EXTRACTION      FAMILY HISTORY Family History  Problem Relation Age of Onset  . Heart disease Mother   . Hypertension Mother   . Diabetes Father   . Heart disease Father   . Heart disease Maternal Grandmother   . Colon cancer Maternal Grandmother 85       died in her 10's  . Heart disease Maternal Grandfather   . Esophageal cancer Maternal Grandfather 50       died at 30  . Diabetes Paternal Grandmother   . Heart disease Paternal Grandmother   . Kidney cancer Paternal Grandmother 95       died in her 2's  . Tuberculosis Paternal Grandfather   . Heart disease Paternal Grandfather   . Skin cancer Paternal Grandfather 88       died at 17, patient not sure if it was melanoma, BCC, Squamous, etc.  . Breast cancer Maternal Aunt 50       died at 10  . Breast cancer Cousin 32       she is now in her 44's  . Rectal cancer Neg Hx   . Stomach cancer Neg Hx   The patient's father died from heart disease at age 82. The patient's mother is living at age 58. The patient has one brother, 2 sisters. On the mother's side and aunt had breast cancer at  age 78 and her daughter had breast cancer in her early 54s. There is also a grandfather with esophageal cancer and a grandmother with colon cancer. On the paternal side there is a grandmother with kidney cancer at an early age and a grandfather with melanoma at an early age.  GYNECOLOGIC HISTORY:  Patient's last menstrual period was 02/11/2014.  Menarche age 89, first live birth age 15, the patient is [place] P2. She underwent hysterectomy without salpingo-oophorectomy May 2016. She did not use hormone replacement. She did use oral contraceptives approximately 10 years in the 1980s.  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 Her husband Fish farm manager in the same [place], which is a 10% aviation enrollment. Daughter Maria Hester is a hairstylist in [place] and son Chip Boer is a Physiological scientist in [place] the patient has 2 grandsons and one granddaughter. She attends a [employer]    ADVANCED DIRECTIVES: The patient has  completed advanced directives and will have the motorized at the next visit   HEALTH MAINTENANCE: Social History  Substance Use Topics  . Smoking status: Former Smoker    Packs/day: 0.50    Years: 34.00    Types: Cigarettes    Quit date: 10/13/2013  . Smokeless tobacco: Never Used  . Alcohol use No     Comment: social drink      Colonoscopy:No  PAP: 03/04/2017  Bone density: No   No Known Allergies  Current Outpatient Prescriptions  Medication Sig Dispense Refill  . ALPRAZolam (XANAX) 0.5 MG tablet TAKE HALF TO 1 TABLET BY MOUTH EVERY 8 HOURS AS NEEDED FOR ANXIETY  2  . cholestyramine (QUESTRAN) 4 GM/DOSE powder Take 1 packet (4 g total) by mouth 2 (two) times daily. (Patient not taking: Reported on 07/15/2017) 378 g 12  . dexamethasone (DECADRON) 4 MG tablet TAKE 2 TABLETS BY MOUTH TWICE DAILY. START DAY BEFORE TAZOTERE THEN AGAIN THE DAY AFTER CHEMO 30 tablet 1  . esomeprazole (NEXIUM) 40  MG capsule Take 1 capsule (40 mg total) by mouth 2 (two) times daily before a meal. 60 capsule 2  . lidocaine-prilocaine (EMLA) cream Apply to affected area once (Patient not taking: Reported on 07/15/2017) 30 g 3  . LORazepam (ATIVAN) 0.5 MG tablet TAKE 1 TABLET BY MOUTH AT BEDTIME AS NEEDED FOR NAUSEA AND VOMITING 30 tablet 0  . metroNIDAZOLE (METROGEL) 1 % gel Apply topically daily. (Patient not taking: Reported on 07/15/2017) 45 g 0  . ondansetron (ZOFRAN) 8 MG tablet Take 1 tablet (8 mg total) by mouth every 8 (eight) hours as needed for nausea or vomiting. 30 tablet 3  . prochlorperazine (COMPAZINE) 10 MG tablet TAKE 1 TABLET BY MOUTH EVERY 6 HOURS AS NEEDED FOR NAUSEA AND VOMITING 30 tablet 1  . sucralfate (CARAFATE) 1 GM/10ML suspension Take 10 mLs (1 g total) by mouth 3 (three) times daily. 3 times daily before meals and at bedtime. 420 mL 2   Current Facility-Administered Medications  Medication Dose Route Frequency Provider Last Rate Last Dose  . 0.9 %  sodium chloride infusion  500 mL Intravenous Continuous Pyrtle, Lajuan Lines, MD       Facility-Administered Medications Ordered in Other Visits  Medication Dose Route Frequency Provider Last Rate Last Dose  . heparin lock flush 100 unit/mL  500 Units Intracatheter Once Magrinat, Virgie Dad, MD      . sodium chloride flush (NS) 0.9 % injection 10 mL  10 mL Intracatheter Once Magrinat, Virgie Dad, MD        OBJECTIVE:Middle-aged white womanWho appears stated age  Vitals:   07/20/17 1626  BP: (!) 107/56  Pulse: 72  Resp: 18  Temp: 98 F (36.7 C)  SpO2: 100%     Body mass index is 30.75 kg/m.    ECOG FS:1 - Symptomatic but completely ambulatory  Sclerae unicteric, EOMs intact Oropharynx clear and moist No cervical or supraclavicular adenopathy Lungs no rales or rhonchi Heart regular rate and rhythm Abd soft, nontender, positive bowel sounds MSK no focal spinal tenderness, no upper extremity lymphedema Neuro: nonfocal, well oriented,  appropriate affect Breasts: Deferred    LAB RESULTS:  CMP     Component Value Date/Time   NA 140 07/20/2017 1615   K 3.3 (L) 07/20/2017 1615   CL 106 03/01/2014 0607   CO2 29 07/20/2017 1615   GLUCOSE 118 07/20/2017 1615   BUN 28.1 (H) 07/20/2017 1615   CREATININE 0.8 07/20/2017 1615  CALCIUM 8.9 07/20/2017 1615   PROT 6.1 (L) 07/20/2017 1615   ALBUMIN 3.6 07/20/2017 1615   AST 14 07/20/2017 1615   ALT 33 07/20/2017 1615   ALKPHOS 86 07/20/2017 1615   BILITOT 0.34 07/20/2017 1615   GFRNONAA >90 03/01/2014 0607   GFRAA >90 03/01/2014 0607    No results found for: TOTALPROTELP, ALBUMINELP, A1GS, A2GS, BETS, BETA2SER, GAMS, MSPIKE, SPEI  No results found for: KPAFRELGTCHN, LAMBDASER, [facility]  Lab Results  Component Value Date   WBC 4.9 07/20/2017   NEUTROABS 1.2 (L) 07/20/2017   HGB 9.3 (L) 07/20/2017   HCT 29.0 (L) 07/20/2017   MCV 98.0 07/20/2017   PLT 113 (L) 07/20/2017      Chemistry      Component Value Date/Time   NA 140 07/20/2017 1615   K 3.3 (L) 07/20/2017 1615   CL 106 03/01/2014 0607   CO2 29 07/20/2017 1615   BUN 28.1 (H) 07/20/2017 1615   CREATININE 0.8 07/20/2017 1615      Component Value Date/Time   CALCIUM 8.9 07/20/2017 1615   ALKPHOS 86 07/20/2017 1615   AST 14 07/20/2017 1615   ALT 33 07/20/2017 1615   BILITOT 0.34 07/20/2017 1615       No results found for: LABCA2  No components found for: [id number]  No results for input(s): INR in the last 168 hours.  Urinalysis    Component Value Date/Time   COLORURINE YELLOW 02/23/2014 1004   APPEARANCEUR CLEAR 02/23/2014 1004   LABSPEC 1.025 02/23/2014 1004   PHURINE 5.5 02/23/2014 1004   GLUCOSEU NEGATIVE 02/23/2014 1004   HGBUR NEGATIVE 02/23/2014 1004   BILIRUBINUR NEGATIVE 02/23/2014 1004   KETONESUR NEGATIVE 02/23/2014 1004   PROTEINUR NEGATIVE 02/23/2014 1004   UROBILINOGEN 0.2 02/23/2014 1004   NITRITE NEGATIVE 02/23/2014 1004   LEUKOCYTESUR NEGATIVE 02/23/2014 1004      STUDIES: Ct Chest W Contrast  Result Date: 06/25/2017 CLINICAL DATA:  Left breast ca dx' d 02/2017 with ongoing chemo C/o anterior chest wall pain x 2-3 mos. Former smoker EXAM: CT CHEST WITH CONTRAST TECHNIQUE: Multidetector CT imaging of the chest was performed during intravenous contrast administration. CONTRAST:  21m ISOVUE-300 IOPAMIDOL (ISOVUE-300) INJECTION 61% COMPARISON:  03/20/2017 bone scan and chest CT FINDINGS: Cardiovascular: No significant vascular findings. Normal heart size. No pericardial effusion. Right-sided Port-A-Cath tip in the superior vena cava. Mediastinum/Nodes: The visualized portion of the thyroid gland has a normal appearance. No mediastinal, hilar, or axillary adenopathy. Small hiatal hernia is present. Lungs/Pleura: Lungs are clear. No pleural effusion or pneumothorax. Upper Abdomen: A 6 mm low-attenuation lesion is identified at the dome of the right hepatic lobe, stable in appearance and favored to be benign. Musculoskeletal: No chest wall abnormality. No acute or significant osseous findings. Normal appearance of the sternum. IMPRESSION: 1. Normal appearance of the chest. 2. Right-sided Port-A-Cath tip to the superior vena cava. Electronically Signed   By: ENolon NationsM.D.   On: 06/25/2017 13:35   Ct Abdomen Pelvis W Contrast  Result Date: 06/26/2017 CLINICAL DATA:  Epigastric abdominal pain after eating. EXAM: CT ABDOMEN AND PELVIS WITH CONTRAST TECHNIQUE: Multidetector CT imaging of the abdomen and pelvis was performed using the standard protocol following bolus administration of intravenous contrast. CONTRAST:  1058mISOVUE-300 IOPAMIDOL (ISOVUE-300) INJECTION 61% COMPARISON:  Abdominal ultrasound 06/12/2017. FINDINGS: Lower chest: The lung bases are clear of acute process. No pleural effusion or pulmonary lesions. The heart is normal in size. No pericardial effusion. The distal esophagus and aorta  are unremarkable. Small hiatal hernia noted. Hepatobiliary:  No focal hepatic lesions or intrahepatic biliary dilatation. The gallbladder demonstrates dependent layering higher attenuation tear which could be sludge. No definite gallstones were seen on the ultrasound. No findings for acute cholecystitis. No common bile duct dilatation. No common bile duct dilatation. Pancreas: No mass, inflammation or ductal dilatation. Spleen: Normal size.  No focal lesions. Adrenals/Urinary Tract: The adrenal glands and kidneys are unremarkable. No renal, ureteral or bladder calculi or mass. Stomach/Bowel: The stomach, duodenum, small bowel and colon are grossly normal without oral contrast. No acute inflammatory changes, mass lesions or obstructive findings. The terminal ileum is normal. The appendix is surgically absent. Vascular/Lymphatic: The aorta is normal in caliber. No dissection. Scattered atherosclerotic calcifications. The branch vessels are patent. The major venous structures are patent. No mesenteric or retroperitoneal mass or adenopathy. Small scattered lymph nodes are noted. Reproductive: Status post supracervical hysterectomy. Other: No pelvic mass or adenopathy. No free pelvic fluid collections. No inguinal mass or adenopathy. No abdominal wall hernia or subcutaneous lesions. Musculoskeletal: No significant bony findings. Degenerative changes at the pubic symphysis. IMPRESSION: 1. Layering high attenuation material in the gallbladder likely gallbladder sludge. No CT findings to suggest acute cholecystitis. 2. No acute abdominal/pelvic findings, mass lesions or adenopathy. 3. Small hiatal hernia. 4. Age advanced atherosclerotic calcifications involving the distal aorta and iliac arteries. Electronically Signed   By: Marijo Sanes M.D.   On: 06/26/2017 15:15   Mr Breast Bilateral W Wo Contrast  Result Date: 07/15/2017 CLINICAL DATA:  Status post neoadjuvant chemotherapy for grade 3 invasive ductal carcinoma and ductal carcinoma in situ with lymphovascular invasion in the  11:30 o'clock position of the left breast, grade 3 ductal carcinoma with ductal carcinoma in situ with calcifications in the upper-outer quadrant of the left breast and metastatic left axillary lymph node. LABS:  None obtained today. EXAM: BILATERAL BREAST MRI WITH AND WITHOUT CONTRAST TECHNIQUE: Multiplanar, multisequence MR images of both breasts were obtained prior to and following the intravenous administration of 18 ml of MultiHance. THREE-DIMENSIONAL MR IMAGE RENDERING ON INDEPENDENT WORKSTATION: Three-dimensional MR images were rendered by post-processing of the original MR data on an independent workstation. The three-dimensional MR images were interpreted, and are reported in the following complete MRI report for this study. Three dimensional images were evaluated at the independent DynaCad workstation COMPARISON:  03/17/2017. FINDINGS: Breast composition: c. Heterogeneous fibroglandular tissue. Background parenchymal enhancement: Minimal. Right breast: No mass or abnormal enhancement. Left breast: No mass or abnormal enhancement. The previously demonstrated 5.1 cm area of asymmetrical, spiculated enhancement in the superior aspect of the breast is no longer demonstrated and the other previously demonstrated enhancing nodules are also no longer demonstrated. Lymph nodes: No abnormal appearing lymph nodes. The previously demonstrated abnormal appearing left axillary lymph nodes no longer have an abnormal appearance. Ancillary findings: 0.6 cm anterior right lobe liver probable hemangioma or cyst without significant change. IMPRESSION: Complete imaging response to neoadjuvant chemotherapy. RECOMMENDATION: Treatment plan. BI-RADS CATEGORY  1: Negative. Electronically Signed   By: Claudie Revering M.D.   On: 07/15/2017 16:44    ELIGIBLE FOR AVAILABLE RESEARCH PROTOCOL: no  ASSESSMENT: 51 y.o. [place], [place] woman status post left breast overlapping sites biopsy 03/02/2017 for a clinical T3 N1-2, stage IIIA  invasive ductal carcinoma, grade 3, estrogen and progesterone receptor negative, HER-2 amplified, with an MIB-1 of 35%  (1) genetics testing07/24/2018 through the Hereditary Gene Panel offered by [facility] found no deleterious mutations in APC, ATM, AXIN2, BARD1, BMPR1A,  BRCA1, BRCA2, BRIP1, CDH1, CDKN2A (p14ARF), CDKN2A (p16INK4a), CHEK2, CTNNA1, DICER1, EPCAM (Deletion/duplication testing only), GREM1 (promoter region deletion/duplication testing only), KIT, MEN1, MLH1, MSH2, MSH3, MSH6, MUTYH, NBN, NF1, NHTL1, PALB2, PDGFRA, PMS2, POLD1, POLE, PTEN, RAD50, RAD51C, RAD51D, SDHB, SDHC, SDHD, SMAD4, SMARCA4. STK11, TP53, TSC1, TSC2, and VHL.  The following genes were evaluated for sequence changes only: SDHA and HOXB13 c.251G>A variant only.  (2) neoadjuvant chemotherapy consisting of carboplatin and Docetaxel given with trastuzumab and Pertuzumab every 21 days for 6 cycles, starting 03/31/2017  (a) Pertuzumab stopped after cycle 1 secondary to side effects     (b) Pertuzumab resumed cycle 3, not at loading dose.  (3) trastuzumab will be continued to complete 6 months  (a) echocardiogram 03/20/2017 showed an ejection fraction of 55%   (4) definitive surgery to follow chemotherapy  (5) adjuvant radiation to follow surgery   PLAN: Asha has completed her chemotherapy and is now ready to proceed to definitive surgery. We briefly looked at the MRI images which are extremely encouraging. She understands however that a complete radiologic response is only 90% correlated with a complete pathologic response.  For that reason I was hesitant to make a commitment that we will stop her Herceptin at 6 months. I will wait on the definitive surgical results and consider whether she needs to go to a full year.  Specifically she will have Herceptin October 23, and then her next dose will be November 20. I will see her on that day and we will should be able to make a decision regarding the length of anti-HER-2  treatment at that time  Otherwise I encouraged her to start a gradual but steady exercise program so she can be in shape for coming surgery  She knows to call for any problems that may develop before the next visit.    Chauncey Cruel, MD 07/20/17 5:36 PM Medical Oncology and Hematology [facility] [address] [place], [place] [address] Tel. [phone] Fax. [phone]  This document serves as a record of services personally performed by Lurline Del, MD. It was created on her behalf by Steva Colder, a trained medical scribe. The creation of this record is based on the scribe's personal observations and the provider's statements to them. This document has been checked and approved by the attending provider.

## 2017-07-21 ENCOUNTER — Other Ambulatory Visit: Payer: Self-pay | Admitting: *Deleted

## 2017-07-21 ENCOUNTER — Other Ambulatory Visit: Payer: Self-pay | Admitting: Pediatrics

## 2017-07-21 DIAGNOSIS — C50812 Malignant neoplasm of overlapping sites of left female breast: Secondary | ICD-10-CM

## 2017-07-22 ENCOUNTER — Encounter: Payer: Self-pay | Admitting: Radiation Oncology

## 2017-07-23 ENCOUNTER — Other Ambulatory Visit: Payer: 59

## 2017-07-24 ENCOUNTER — Other Ambulatory Visit: Payer: Self-pay

## 2017-07-24 MED ORDER — ACYCLOVIR 400 MG PO TABS: 400.0000 mg | ORAL_TABLET | Freq: Every day | ORAL | 0 refills | Status: DC

## 2017-07-29 ENCOUNTER — Telehealth: Payer: Self-pay | Admitting: *Deleted

## 2017-07-29 NOTE — Telephone Encounter (Signed)
Called patient and LM for her to call me.  I called the pharmacy and I was told that the insurance company won't pay for Nexium 40 mg twice daily.  Per Nicoletta Ba PA, she thinks the patient should stay on this medication if it is helping her.  I looked at her historical medication list and there is no other PPI on it except Nexium 20 mg.

## 2017-07-30 NOTE — Telephone Encounter (Signed)
Called and left a second message for the patient to please call me.

## 2017-07-30 NOTE — Telephone Encounter (Signed)
The patient called me back and advised she has been taking 1 tablet daily of the Nexium 40 mg. It has been working well.  She started the one daily due to a diagnosis of another problem.   She said she has refills and will continue the once daily.  I advised her I would let Nicoletta Ba PA know.  I told her to call us if she has any problems.

## 2017-08-04 ENCOUNTER — Other Ambulatory Visit (HOSPITAL_BASED_OUTPATIENT_CLINIC_OR_DEPARTMENT_OTHER): Payer: 59

## 2017-08-04 ENCOUNTER — Ambulatory Visit (HOSPITAL_BASED_OUTPATIENT_CLINIC_OR_DEPARTMENT_OTHER): Payer: 59

## 2017-08-04 ENCOUNTER — Other Ambulatory Visit: Payer: Self-pay | Admitting: Oncology

## 2017-08-04 VITALS — BP 106/46 | HR 85 | Temp 97.8°F | Resp 18

## 2017-08-04 DIAGNOSIS — Z171 Estrogen receptor negative status [ER-]: Principal | ICD-10-CM

## 2017-08-04 DIAGNOSIS — Z5112 Encounter for antineoplastic immunotherapy: Secondary | ICD-10-CM

## 2017-08-04 DIAGNOSIS — C50812 Malignant neoplasm of overlapping sites of left female breast: Secondary | ICD-10-CM

## 2017-08-04 LAB — CBC WITH DIFFERENTIAL/PLATELET
BASO%: 0.4 % (ref 0.0–2.0)
BASOS ABS: 0 10*3/uL (ref 0.0–0.1)
EOS%: 0 % (ref 0.0–7.0)
Eosinophils Absolute: 0 10*3/uL (ref 0.0–0.5)
HEMATOCRIT: 30.4 % — AB (ref 34.8–46.6)
HGB: 10.1 g/dL — ABNORMAL LOW (ref 11.6–15.9)
LYMPH#: 2.3 10*3/uL (ref 0.9–3.3)
LYMPH%: 49.6 % (ref 14.0–49.7)
MCH: 32.5 pg (ref 25.1–34.0)
MCHC: 33.1 g/dL (ref 31.5–36.0)
MCV: 98.2 fL (ref 79.5–101.0)
MONO#: 0.5 10*3/uL (ref 0.1–0.9)
MONO%: 10.1 % (ref 0.0–14.0)
NEUT%: 39.9 % (ref 38.4–76.8)
NEUTROS ABS: 1.9 10*3/uL (ref 1.5–6.5)
Platelets: 311 10*3/uL (ref 145–400)
RBC: 3.1 10*6/uL — ABNORMAL LOW (ref 3.70–5.45)
RDW: 17.6 % — AB (ref 11.2–14.5)
WBC: 4.7 10*3/uL (ref 3.9–10.3)

## 2017-08-04 LAB — COMPREHENSIVE METABOLIC PANEL
ALBUMIN: 3.2 g/dL — AB (ref 3.5–5.0)
ALT: 25 U/L (ref 0–55)
AST: 22 U/L (ref 5–34)
Alkaline Phosphatase: 68 U/L (ref 40–150)
Anion Gap: 8 mEq/L (ref 3–11)
BILIRUBIN TOTAL: 0.24 mg/dL (ref 0.20–1.20)
BUN: 14.1 mg/dL (ref 7.0–26.0)
CALCIUM: 9 mg/dL (ref 8.4–10.4)
CO2: 26 mEq/L (ref 22–29)
CREATININE: 0.7 mg/dL (ref 0.6–1.1)
Chloride: 108 mEq/L (ref 98–109)
EGFR: >60 mL/min/{1.73_m2} (ref >60)
Glucose: 109 mg/dl (ref 70–140)
Potassium: 4 mEq/L (ref 3.5–5.1)
SODIUM: 142 meq/L (ref 136–145)
TOTAL PROTEIN: 6 g/dL — AB (ref 6.4–8.3)

## 2017-08-04 MED ORDER — ACETAMINOPHEN 325 MG PO TABS
650.0000 mg | ORAL_TABLET | Freq: Once | ORAL | Status: AC
  Administered 2017-08-04: 650 mg via ORAL

## 2017-08-04 MED ORDER — TRASTUZUMAB CHEMO 150 MG IV SOLR
6.0000 mg/kg | Freq: Once | INTRAVENOUS | Status: AC
  Administered 2017-08-04: 525 mg via INTRAVENOUS
  Filled 2017-08-04: qty 25

## 2017-08-04 MED ORDER — ACETAMINOPHEN 325 MG PO TABS
ORAL_TABLET | ORAL | Status: AC
  Filled 2017-08-04: qty 2

## 2017-08-04 MED ORDER — SODIUM CHLORIDE 0.9 % IV SOLN
Freq: Once | INTRAVENOUS | Status: AC
  Administered 2017-08-04: 14:00:00 via INTRAVENOUS

## 2017-08-04 MED ORDER — DIPHENHYDRAMINE HCL 25 MG PO CAPS
25.0000 mg | ORAL_CAPSULE | Freq: Once | ORAL | Status: AC
  Administered 2017-08-04: 25 mg via ORAL

## 2017-08-04 MED ORDER — SODIUM CHLORIDE 0.9% FLUSH
10.0000 mL | INTRAVENOUS | Status: DC | PRN
  Administered 2017-08-04: 10 mL
  Filled 2017-08-04: qty 10

## 2017-08-04 MED ORDER — HEPARIN SOD (PORK) LOCK FLUSH 100 UNIT/ML IV SOLN
500.0000 [IU] | Freq: Once | INTRAVENOUS | Status: AC | PRN
  Administered 2017-08-04: 500 [IU]
  Filled 2017-08-04: qty 5

## 2017-08-04 MED ORDER — DIPHENHYDRAMINE HCL 25 MG PO CAPS
ORAL_CAPSULE | ORAL | Status: AC
  Filled 2017-08-04: qty 1

## 2017-08-10 ENCOUNTER — Telehealth: Payer: Self-pay | Admitting: Oncology

## 2017-08-10 NOTE — Telephone Encounter (Signed)
08/10/2017 Faxed medical records to include lab work, office notes with physical examination findings, diagnostic test results, outline of treatment plan, and medications and any side effects to [place] [employer] to [phone]

## 2017-08-12 NOTE — Pre-Procedure Instructions (Addendum)
Maria Hester  08/12/2017      [id number] [address], [place] [address] Phone: [phone] Fax: [phone]    Your procedure is scheduled on Wednesday November 7.  Report to [facility] Admitting at 10:00 A.M.  Call this number if you have problems the morning of surgery:  [phone]   Remember:  Do not eat food or drink liquids after midnight.  Take these medicines the morning of surgery with A SIP OF WATER:  Esomeprazole (nexium) Acetaminophen (tylenol) if needed Acyclovir (zovirax) Alprazolam (Xanax) if needed  7 days prior to surgery STOP taking any Aspirin (unless otherwise instructed by your surgeon), Aleve, Naproxen, Ibuprofen, Motrin, Advil, Goody's, BC's, all herbal medications, fish oil, and all vitamins    Do not wear jewelry, make-up or nail polish.  Do not wear lotions, powders, or perfumes, or deoderant.  Do not shave 48 hours prior to surgery.  Men may shave face and neck.  Do not bring valuables to the hospital.  [facility] is not responsible for any belongings or valuables.  Contacts, dentures or bridgework may not be worn into surgery.  Leave your suitcase in the car.  After surgery it may be brought to your room.  For patients admitted to the hospital, discharge time will be determined by your treatment team.  Patients discharged the day of surgery will not be allowed to drive home.   Special instructions:    [facility]- Preparing For Surgery  Before surgery, you can play an important role. Because skin is not sterile, your skin needs to be as free of germs as possible. You can reduce the number of germs on your skin by washing with CHG (chlorahexidine gluconate) Soap before surgery.  CHG is an antiseptic cleaner which kills germs and bonds with the skin to continue killing germs even after washing.  Please do not use if you have an allergy to CHG or antibacterial soaps. If  your skin becomes reddened/irritated stop using the CHG.  Do not shave (including legs and underarms) for at least 48 hours prior to first CHG shower. It is OK to shave your face.  Please follow these instructions carefully.   1. Shower the NIGHT BEFORE SURGERY and the MORNING OF SURGERY with CHG.   2. If you chose to wash your hair, wash your hair first as usual with your normal shampoo.  3. After you shampoo, rinse your hair and body thoroughly to remove the shampoo.  4. Use CHG as you would any other liquid soap. You can apply CHG directly to the skin and wash gently with a scrungie or a clean washcloth.   5. Apply the CHG Soap to your body ONLY FROM THE NECK DOWN.  Do not use on open wounds or open sores. Avoid contact with your eyes, ears, mouth and genitals (private parts). Wash Face and genitals (private parts)  with your normal soap.  6. Wash thoroughly, paying special attention to the area where your surgery will be performed.  7. Thoroughly rinse your body with warm water from the neck down.  8. DO NOT shower/wash with your normal soap after using and rinsing off the CHG Soap.  9. Pat yourself dry with a CLEAN TOWEL.  10. Wear CLEAN PAJAMAS to bed the night before surgery, wear comfortable clothes the morning of surgery  11. Place CLEAN SHEETS on your bed the night of your first shower and  DO NOT SLEEP WITH PETS.    Day of Surgery: Do not apply any deodorants/lotions. Please wear clean clothes to the hospital/surgery center.      Please read over the following fact sheets that you were given. Coughing and Deep Breathing

## 2017-08-13 ENCOUNTER — Encounter (HOSPITAL_COMMUNITY): Payer: Self-pay

## 2017-08-13 ENCOUNTER — Encounter (HOSPITAL_COMMUNITY)
Admission: RE | Admit: 2017-08-13 | Discharge: 2017-08-13 | Disposition: A | Discharge status: Home / Self Care | Payer: 59 | Source: Ambulatory Visit | Attending: General Surgery | Admitting: General Surgery

## 2017-08-13 DIAGNOSIS — Z01810 Encounter for preprocedural cardiovascular examination: Secondary | ICD-10-CM | POA: Insufficient documentation

## 2017-08-13 DIAGNOSIS — Z01812 Encounter for preprocedural laboratory examination: Secondary | ICD-10-CM | POA: Diagnosis not present

## 2017-08-13 DIAGNOSIS — Z87891 Personal history of nicotine dependence: Secondary | ICD-10-CM | POA: Diagnosis not present

## 2017-08-13 DIAGNOSIS — C50912 Malignant neoplasm of unspecified site of left female breast: Secondary | ICD-10-CM | POA: Diagnosis not present

## 2017-08-13 DIAGNOSIS — E785 Hyperlipidemia, unspecified: Secondary | ICD-10-CM | POA: Insufficient documentation

## 2017-08-13 DIAGNOSIS — K819 Cholecystitis, unspecified: Secondary | ICD-10-CM | POA: Diagnosis not present

## 2017-08-13 HISTORY — DX: Personal history of other diseases of the digestive system: Z87.19

## 2017-08-13 HISTORY — PX: OTHER SURGICAL HISTORY: SHX169

## 2017-08-13 HISTORY — DX: Disease of gallbladder, unspecified: K82.9

## 2017-08-13 LAB — BASIC METABOLIC PANEL
ANION GAP: 7 (ref 5–15)
BUN: 14 mg/dL (ref 6–20)
CHLORIDE: 108 mmol/L (ref 101–111)
CO2: 25 mmol/L (ref 22–32)
Calcium: 8.9 mg/dL (ref 8.9–10.3)
Creatinine, Ser: 0.7 mg/dL (ref 0.44–1.00)
GFR calc non Af Amer: >60 mL/min (ref >60)
Glucose, Bld: 96 mg/dL (ref 65–99)
POTASSIUM: 4.7 mmol/L (ref 3.5–5.1)
Sodium: 140 mmol/L (ref 135–145)

## 2017-08-13 LAB — CBC
HEMATOCRIT: 32 % — AB (ref 36.0–46.0)
Hemoglobin: 10.1 g/dL — ABNORMAL LOW (ref 12.0–15.0)
MCH: 31.4 pg (ref 26.0–34.0)
MCHC: 31.6 g/dL (ref 30.0–36.0)
MCV: 99.4 fL (ref 78.0–100.0)
Platelets: 390 10*3/uL (ref 150–400)
RBC: 3.22 MIL/uL — AB (ref 3.87–5.11)
RDW: 15.6 % — ABNORMAL HIGH (ref 11.5–15.5)
WBC: 5.7 10*3/uL (ref 4.0–10.5)

## 2017-08-13 NOTE — Progress Notes (Addendum)
PCP: Dr. Algis Liming, MD [place], [place])  Cardiologist:  Dr. Loralie Champagne  EKG: denies past year, obtained today  Stress test: 2017 at Dr. Tommie Sams' office-records requested  ECHO: 06/30/2017 in [facility]  Cardiac Cath: denies ever  Chest x-ray: 03/28/17

## 2017-08-14 NOTE — Progress Notes (Signed)
Anesthesia Chart Review:  Pt is a 51 year old female scheduled for L breast lumpectomy with radioactive seed and L sentinel lymph node biopsy, laparoscopic cholecystectomy with intraoperative cholangiogram on 08/19/2017 with Excell Seltzer, MD  - PCP is Mallory Shirk, MD - Cardiologist is Loralie Champagne, MD. Last office visit 03/21/17  PMH includes:  Hyperlipidemia, breast cancer. Former smoker. BMI 32.5. S/p port-a-cath insertion 03/18/17. S/p hysterectomy 02/28/14  Medications include: nexium, rosuvastatin.   BP (!) 106/49   Pulse 81   Temp 36.9 C (Oral)   Resp 18   Ht 5\' 5"  (1.651 m)   Wt 195 lb 3 oz (88.5 kg)   LMP 02/11/2014   SpO2 98%   BMI 32.48 kg/m    Preoperative labs reviewed.    CT chest 06/25/17:  1. Normal appearance of the chest. 2. Right-sided Port-A-Cath tip to the superior vena cava.  EKG 08/13/17: Sinus rhythm with marked sinus arrhythmia  Echo 06/30/17:  - Left ventricle: The cavity size was normal. Wall thickness was normal. The estimated ejection fraction was 55%. Wall motion was normal; there were no regional wall motion abnormalities. Features are consistent with a pseudonormal left ventricular filling pattern, with concomitant abnormal relaxation and increased filling pressure (grade 2 diastolic dysfunction). GLS -19%. - Aortic valve: There was no stenosis. - Mitral valve: There was no significant regurgitation. - Right ventricle: The cavity size was mildly dilated. Systolic function was normal. - Pulmonary arteries: No complete TR doppler jet so unable to estimate PA systolic pressure. - Inferior vena cava: The vessel was normal in size. The respirophasic diameter changes were in the normal range (>= 50%), consistent with normal central venous pressure. - Impressions: Normal LV size and systolic function, EF 98%. Moderate diastolic dysfunction. Strain as above. Mildly dilated RV with normal systolic function. No significant valvular abnormalities.  Stress  echo 12/10/15 ([place], [place], [place]):  1. Negative for ischemia. 2. LVEF normal at rest (60%) and stress (75%). Normal stress echo  If no changes, I anticipate pt can proceed with surgery as scheduled.   Willeen Cass, FNP-BC [facility] Stay [facility]/Anesthesiology Phone: [phone] 08/14/2017 1:33 PM

## 2017-08-18 ENCOUNTER — Other Ambulatory Visit: Payer: Self-pay | Admitting: General Surgery

## 2017-08-18 ENCOUNTER — Ambulatory Visit
Admission: RE | Admit: 2017-08-18 | Discharge: 2017-08-18 | Disposition: A | Discharge status: Home / Self Care | Payer: 59 | Source: Ambulatory Visit | Attending: General Surgery | Admitting: General Surgery

## 2017-08-18 DIAGNOSIS — C50912 Malignant neoplasm of unspecified site of left female breast: Secondary | ICD-10-CM

## 2017-08-19 ENCOUNTER — Ambulatory Visit
Admission: RE | Admit: 2017-08-19 | Discharge: 2017-08-19 | Disposition: A | Discharge status: Home / Self Care | Payer: 59 | Source: Ambulatory Visit | Attending: General Surgery | Admitting: General Surgery

## 2017-08-19 ENCOUNTER — Ambulatory Visit (HOSPITAL_COMMUNITY): Payer: 59 | Admitting: Anesthesiology

## 2017-08-19 ENCOUNTER — Other Ambulatory Visit: Payer: Self-pay | Admitting: General Surgery

## 2017-08-19 ENCOUNTER — Encounter (HOSPITAL_COMMUNITY)
Admission: RE | Disposition: A | Discharge status: Home / Self Care | Payer: Self-pay | Source: Ambulatory Visit | Attending: General Surgery

## 2017-08-19 ENCOUNTER — Encounter (HOSPITAL_COMMUNITY): Payer: Self-pay | Admitting: *Deleted

## 2017-08-19 ENCOUNTER — Ambulatory Visit (HOSPITAL_COMMUNITY): Payer: 59 | Admitting: Emergency Medicine

## 2017-08-19 ENCOUNTER — Ambulatory Visit (HOSPITAL_COMMUNITY)
Admission: RE | Admit: 2017-08-19 | Discharge: 2017-08-19 | Disposition: A | Discharge status: Home / Self Care | Payer: 59 | Source: Ambulatory Visit | Attending: General Surgery | Admitting: General Surgery

## 2017-08-19 ENCOUNTER — Ambulatory Visit (HOSPITAL_COMMUNITY): Payer: 59

## 2017-08-19 DIAGNOSIS — Z171 Estrogen receptor negative status [ER-]: Secondary | ICD-10-CM | POA: Diagnosis not present

## 2017-08-19 DIAGNOSIS — C50912 Malignant neoplasm of unspecified site of left female breast: Secondary | ICD-10-CM

## 2017-08-19 DIAGNOSIS — E079 Disorder of thyroid, unspecified: Secondary | ICD-10-CM | POA: Diagnosis not present

## 2017-08-19 DIAGNOSIS — Z1501 Genetic susceptibility to malignant neoplasm of breast: Secondary | ICD-10-CM | POA: Insufficient documentation

## 2017-08-19 DIAGNOSIS — K811 Chronic cholecystitis: Secondary | ICD-10-CM | POA: Diagnosis present

## 2017-08-19 DIAGNOSIS — Z8249 Family history of ischemic heart disease and other diseases of the circulatory system: Secondary | ICD-10-CM | POA: Diagnosis not present

## 2017-08-19 DIAGNOSIS — Z833 Family history of diabetes mellitus: Secondary | ICD-10-CM | POA: Diagnosis not present

## 2017-08-19 DIAGNOSIS — K449 Diaphragmatic hernia without obstruction or gangrene: Secondary | ICD-10-CM | POA: Diagnosis not present

## 2017-08-19 DIAGNOSIS — Z9221 Personal history of antineoplastic chemotherapy: Secondary | ICD-10-CM | POA: Diagnosis not present

## 2017-08-19 DIAGNOSIS — Z79899 Other long term (current) drug therapy: Secondary | ICD-10-CM | POA: Diagnosis not present

## 2017-08-19 DIAGNOSIS — Z90710 Acquired absence of both cervix and uterus: Secondary | ICD-10-CM | POA: Diagnosis not present

## 2017-08-19 DIAGNOSIS — F17200 Nicotine dependence, unspecified, uncomplicated: Secondary | ICD-10-CM | POA: Diagnosis not present

## 2017-08-19 DIAGNOSIS — K219 Gastro-esophageal reflux disease without esophagitis: Secondary | ICD-10-CM | POA: Diagnosis not present

## 2017-08-19 DIAGNOSIS — E7800 Pure hypercholesterolemia, unspecified: Secondary | ICD-10-CM | POA: Insufficient documentation

## 2017-08-19 DIAGNOSIS — Z419 Encounter for procedure for purposes other than remedying health state, unspecified: Secondary | ICD-10-CM

## 2017-08-19 DIAGNOSIS — C50412 Malignant neoplasm of upper-outer quadrant of left female breast: Secondary | ICD-10-CM | POA: Insufficient documentation

## 2017-08-19 HISTORY — PX: BREAST LUMPECTOMY WITH RADIOACTIVE SEED AND SENTINEL LYMPH NODE BIOPSY: SHX6550

## 2017-08-19 HISTORY — PX: CHOLECYSTECTOMY: SHX55

## 2017-08-19 SURGERY — BREAST LUMPECTOMY WITH RADIOACTIVE SEED AND SENTINEL LYMPH NODE BIOPSY
Anesthesia: Regional | Site: Breast

## 2017-08-19 MED ORDER — SUGAMMADEX SODIUM 200 MG/2ML IV SOLN
INTRAVENOUS | Status: DC | PRN
  Administered 2017-08-19: 200 mg via INTRAVENOUS

## 2017-08-19 MED ORDER — CEFAZOLIN SODIUM-DEXTROSE 2-4 GM/100ML-% IV SOLN
2.0000 g | INTRAVENOUS | Status: AC
  Administered 2017-08-19: 2 g via INTRAVENOUS

## 2017-08-19 MED ORDER — ROCURONIUM BROMIDE 100 MG/10ML IV SOLN
INTRAVENOUS | Status: DC | PRN
  Administered 2017-08-19 (×2): 50 mg via INTRAVENOUS

## 2017-08-19 MED ORDER — TECHNETIUM TC 99M SULFUR COLLOID FILTERED
1.0000 | Freq: Once | INTRAVENOUS | Status: AC | PRN
  Administered 2017-08-19: 1 via INTRADERMAL

## 2017-08-19 MED ORDER — EPHEDRINE 5 MG/ML INJ
INTRAVENOUS | Status: AC
  Filled 2017-08-19: qty 10

## 2017-08-19 MED ORDER — EPHEDRINE SULFATE 50 MG/ML IJ SOLN
INTRAMUSCULAR | Status: DC | PRN
  Administered 2017-08-19 (×2): 10 mg via INTRAVENOUS

## 2017-08-19 MED ORDER — MIDAZOLAM HCL 2 MG/2ML IJ SOLN
1.7500 mg | Freq: Once | INTRAMUSCULAR | Status: AC
  Administered 2017-08-19: 1.8 mg via INTRAVENOUS

## 2017-08-19 MED ORDER — ACETAMINOPHEN 500 MG PO TABS
1000.0000 mg | ORAL_TABLET | ORAL | Status: AC
  Administered 2017-08-19: 1000 mg via ORAL

## 2017-08-19 MED ORDER — LACTATED RINGERS IV SOLN
INTRAVENOUS | Status: DC | PRN
  Administered 2017-08-19 (×2): via INTRAVENOUS

## 2017-08-19 MED ORDER — ONDANSETRON HCL 4 MG/2ML IJ SOLN
INTRAMUSCULAR | Status: AC
  Filled 2017-08-19: qty 2

## 2017-08-19 MED ORDER — FENTANYL CITRATE (PF) 100 MCG/2ML IJ SOLN
INTRAMUSCULAR | Status: DC | PRN
  Administered 2017-08-19 (×5): 50 ug via INTRAVENOUS

## 2017-08-19 MED ORDER — BUPIVACAINE-EPINEPHRINE 0.25% -1:200000 IJ SOLN
INTRAMUSCULAR | Status: AC
  Filled 2017-08-19: qty 1

## 2017-08-19 MED ORDER — GLYCOPYRROLATE 0.2 MG/ML IJ SOLN
INTRAMUSCULAR | Status: DC | PRN
  Administered 2017-08-19: 0.2 mg via INTRAVENOUS

## 2017-08-19 MED ORDER — CEFAZOLIN SODIUM-DEXTROSE 2-4 GM/100ML-% IV SOLN
INTRAVENOUS | Status: AC
  Filled 2017-08-19: qty 100

## 2017-08-19 MED ORDER — LIDOCAINE 2% (20 MG/ML) 5 ML SYRINGE
INTRAMUSCULAR | Status: AC
  Filled 2017-08-19: qty 5

## 2017-08-19 MED ORDER — IOPAMIDOL (ISOVUE-300) INJECTION 61%
INTRAVENOUS | Status: DC | PRN
  Administered 2017-08-19: 15 mL

## 2017-08-19 MED ORDER — MIDAZOLAM HCL 2 MG/2ML IJ SOLN
INTRAMUSCULAR | Status: AC
  Filled 2017-08-19: qty 2

## 2017-08-19 MED ORDER — SODIUM CHLORIDE 0.9 % IJ SOLN
INTRAMUSCULAR | Status: AC
  Filled 2017-08-19: qty 10

## 2017-08-19 MED ORDER — ACETAMINOPHEN 500 MG PO TABS
ORAL_TABLET | ORAL | Status: AC
  Administered 2017-08-19: 1000 mg via ORAL
  Filled 2017-08-19: qty 2

## 2017-08-19 MED ORDER — ONDANSETRON HCL 4 MG/2ML IJ SOLN
INTRAMUSCULAR | Status: DC | PRN
  Administered 2017-08-19: 4 mg via INTRAVENOUS

## 2017-08-19 MED ORDER — SUGAMMADEX SODIUM 200 MG/2ML IV SOLN
INTRAVENOUS | Status: AC
  Filled 2017-08-19: qty 2

## 2017-08-19 MED ORDER — FENTANYL CITRATE (PF) 250 MCG/5ML IJ SOLN
INTRAMUSCULAR | Status: AC
  Filled 2017-08-19: qty 5

## 2017-08-19 MED ORDER — FENTANYL CITRATE (PF) 100 MCG/2ML IJ SOLN
INTRAMUSCULAR | Status: AC
  Administered 2017-08-19: 75 ug via INTRAVENOUS
  Filled 2017-08-19: qty 2

## 2017-08-19 MED ORDER — PHENYLEPHRINE 40 MCG/ML (10ML) SYRINGE FOR IV PUSH (FOR BLOOD PRESSURE SUPPORT)
PREFILLED_SYRINGE | INTRAVENOUS | Status: AC
  Filled 2017-08-19: qty 10

## 2017-08-19 MED ORDER — SODIUM CHLORIDE 0.9 % IJ SOLN
INTRAVENOUS | Status: DC | PRN
  Administered 2017-08-19: 5 mL

## 2017-08-19 MED ORDER — SODIUM CHLORIDE 0.9 % IR SOLN
Status: DC | PRN
  Administered 2017-08-19: 1000 mL

## 2017-08-19 MED ORDER — PHENYLEPHRINE HCL 10 MG/ML IJ SOLN
INTRAMUSCULAR | Status: DC | PRN
  Administered 2017-08-19: 80 ug via INTRAVENOUS
  Administered 2017-08-19 (×2): 40 ug via INTRAVENOUS

## 2017-08-19 MED ORDER — MIDAZOLAM HCL 2 MG/2ML IJ SOLN
INTRAMUSCULAR | Status: AC
  Administered 2017-08-19: 1.8 mg via INTRAVENOUS
  Filled 2017-08-19: qty 2

## 2017-08-19 MED ORDER — PROPOFOL 10 MG/ML IV BOLUS
INTRAVENOUS | Status: DC | PRN
  Administered 2017-08-19: 150 mg via INTRAVENOUS
  Administered 2017-08-19: 50 mg via INTRAVENOUS

## 2017-08-19 MED ORDER — DEXAMETHASONE SODIUM PHOSPHATE 10 MG/ML IJ SOLN
INTRAMUSCULAR | Status: DC | PRN
  Administered 2017-08-19: 5 mg via INTRAVENOUS

## 2017-08-19 MED ORDER — PROPOFOL 10 MG/ML IV BOLUS
INTRAVENOUS | Status: AC
  Filled 2017-08-19: qty 40

## 2017-08-19 MED ORDER — VECURONIUM BROMIDE 10 MG IV SOLR
INTRAVENOUS | Status: DC | PRN
  Administered 2017-08-19 (×2): 2 mg via INTRAVENOUS

## 2017-08-19 MED ORDER — 0.9 % SODIUM CHLORIDE (POUR BTL) OPTIME
TOPICAL | Status: DC | PRN
  Administered 2017-08-19: 1000 mL

## 2017-08-19 MED ORDER — DEXAMETHASONE SODIUM PHOSPHATE 10 MG/ML IJ SOLN
INTRAMUSCULAR | Status: AC
  Filled 2017-08-19: qty 1

## 2017-08-19 MED ORDER — GABAPENTIN 300 MG PO CAPS
ORAL_CAPSULE | ORAL | Status: AC
  Administered 2017-08-19: 300 mg via ORAL
  Filled 2017-08-19: qty 1

## 2017-08-19 MED ORDER — GABAPENTIN 300 MG PO CAPS
300.0000 mg | ORAL_CAPSULE | ORAL | Status: AC
  Administered 2017-08-19: 300 mg via ORAL

## 2017-08-19 MED ORDER — CHLORHEXIDINE GLUCONATE CLOTH 2 % EX PADS: 6.0000 | MEDICATED_PAD | Freq: Once | CUTANEOUS | Status: DC

## 2017-08-19 MED ORDER — ROCURONIUM BROMIDE 10 MG/ML (PF) SYRINGE
PREFILLED_SYRINGE | INTRAVENOUS | Status: AC
  Filled 2017-08-19: qty 5

## 2017-08-19 MED ORDER — HYDROMORPHONE HCL 1 MG/ML IJ SOLN
INTRAMUSCULAR | Status: AC
  Filled 2017-08-19: qty 0.5

## 2017-08-19 MED ORDER — CELECOXIB 200 MG PO CAPS
ORAL_CAPSULE | ORAL | Status: AC
  Administered 2017-08-19: 200 mg via ORAL
  Filled 2017-08-19: qty 1

## 2017-08-19 MED ORDER — LIDOCAINE HCL (CARDIAC) 20 MG/ML IV SOLN
INTRAVENOUS | Status: DC | PRN
  Administered 2017-08-19: 80 mg via INTRAVENOUS

## 2017-08-19 MED ORDER — CELECOXIB 200 MG PO CAPS
200.0000 mg | ORAL_CAPSULE | ORAL | Status: AC
  Administered 2017-08-19: 200 mg via ORAL

## 2017-08-19 MED ORDER — HYDROMORPHONE HCL 1 MG/ML IJ SOLN
INTRAMUSCULAR | Status: DC | PRN
  Administered 2017-08-19: 0.5 mg via INTRAVENOUS

## 2017-08-19 MED ORDER — BUPIVACAINE-EPINEPHRINE 0.25% -1:200000 IJ SOLN
INTRAMUSCULAR | Status: DC | PRN
  Administered 2017-08-19: 20 mL
  Administered 2017-08-19: 11 mL

## 2017-08-19 MED ORDER — OXYCODONE HCL 5 MG PO TABS: 5.0000 mg | ORAL_TABLET | Freq: Four times a day (QID) | ORAL | 0 refills | Status: DC | PRN

## 2017-08-19 MED ORDER — IOPAMIDOL (ISOVUE-300) INJECTION 61%
INTRAVENOUS | Status: AC
  Filled 2017-08-19: qty 50

## 2017-08-19 MED ORDER — METHYLENE BLUE 0.5 % INJ SOLN
INTRAVENOUS | Status: AC
  Filled 2017-08-19: qty 10

## 2017-08-19 MED ORDER — ONDANSETRON HCL 4 MG PO TABS: 4.0000 mg | ORAL_TABLET | Freq: Three times a day (TID) | ORAL | 1 refills | Status: DC | PRN

## 2017-08-19 MED ORDER — FENTANYL CITRATE (PF) 100 MCG/2ML IJ SOLN
75.0000 ug | Freq: Once | INTRAMUSCULAR | Status: AC
  Administered 2017-08-19: 75 ug via INTRAVENOUS

## 2017-08-19 MED ORDER — ALBUMIN HUMAN 5 % IV SOLN
INTRAVENOUS | Status: DC | PRN
  Administered 2017-08-19: 13:00:00 via INTRAVENOUS

## 2017-08-19 SURGICAL SUPPLY — 60 items
APPLIER CLIP ROT 10 11.4 M/L (STAPLE) ×4
BINDER BREAST XLRG (GAUZE/BANDAGES/DRESSINGS) ×4 IMPLANT
BLADE SURG 15 STRL LF DISP TIS (BLADE) ×2 IMPLANT
BLADE SURG 15 STRL SS (BLADE) ×2
CANISTER SUCT 3000ML PPV (MISCELLANEOUS) ×4 IMPLANT
CHLORAPREP W/TINT 26ML (MISCELLANEOUS) ×4 IMPLANT
CLIP APPLIE ROT 10 11.4 M/L (STAPLE) ×2 IMPLANT
CLIP VESOCCLUDE MED 6/CT (CLIP) ×4 IMPLANT
CONT SPEC 4OZ CLIKSEAL STRL BL (MISCELLANEOUS) ×40 IMPLANT
COVER MAYO STAND STRL (DRAPES) ×4 IMPLANT
COVER PROBE W GEL 5X96 (DRAPES) ×4 IMPLANT
COVER SURGICAL LIGHT HANDLE (MISCELLANEOUS) ×4 IMPLANT
DERMABOND ADVANCED (GAUZE/BANDAGES/DRESSINGS) ×2
DERMABOND ADVANCED .7 DNX12 (GAUZE/BANDAGES/DRESSINGS) ×2 IMPLANT
DEVICE DUBIN SPECIMEN MAMMOGRA (MISCELLANEOUS) ×4 IMPLANT
DRAPE C-ARM 42X72 X-RAY (DRAPES) ×4 IMPLANT
DRAPE SURG 17X23 STRL (DRAPES) ×16 IMPLANT
DRAPE UTILITY XL STRL (DRAPES) ×4 IMPLANT
ELECT COATED BLADE 2.86 ST (ELECTRODE) ×4 IMPLANT
ELECT REM PT RETURN 9FT ADLT (ELECTROSURGICAL) ×4
ELECTRODE REM PT RTRN 9FT ADLT (ELECTROSURGICAL) ×2 IMPLANT
GLOVE BIOGEL PI IND STRL 7.0 (GLOVE) ×2 IMPLANT
GLOVE BIOGEL PI IND STRL 8 (GLOVE) ×2 IMPLANT
GLOVE BIOGEL PI INDICATOR 7.0 (GLOVE) ×2
GLOVE BIOGEL PI INDICATOR 8 (GLOVE) ×2
GLOVE ECLIPSE 7.5 STRL STRAW (GLOVE) ×12 IMPLANT
GLOVE ECLIPSE 8.0 STRL XLNG CF (GLOVE) ×4 IMPLANT
GOWN STRL REUS W/ TWL XL LVL3 (GOWN DISPOSABLE) ×4 IMPLANT
GOWN STRL REUS W/TWL XL LVL3 (GOWN DISPOSABLE) ×4
KIT BASIN OR (CUSTOM PROCEDURE TRAY) ×4 IMPLANT
KIT MARKER MARGIN INK (KITS) ×4 IMPLANT
KIT ROOM TURNOVER OR (KITS) ×4 IMPLANT
NDL SAFETY ECLIPSE 18X1.5 (NEEDLE) ×2 IMPLANT
NEEDLE FILTER BLUNT 18X 1/2SAF (NEEDLE) ×2
NEEDLE FILTER BLUNT 18X1 1/2 (NEEDLE) ×2 IMPLANT
NEEDLE HYPO 18GX1.5 SHARP (NEEDLE) ×2
NEEDLE HYPO 25GX1X1/2 BEV (NEEDLE) ×4 IMPLANT
NS IRRIG 1000ML POUR BTL (IV SOLUTION) ×4 IMPLANT
PAD ABD 8X10 STRL (GAUZE/BANDAGES/DRESSINGS) ×4 IMPLANT
PAD ARMBOARD 7.5X6 YLW CONV (MISCELLANEOUS) ×4 IMPLANT
PENCIL BUTTON HOLSTER BLD 10FT (ELECTRODE) ×4 IMPLANT
POUCH SPECIMEN RETRIEVAL 10MM (ENDOMECHANICALS) ×4 IMPLANT
SCISSORS LAP 5X35 DISP (ENDOMECHANICALS) ×4 IMPLANT
SET CHOLANGIOGRAPH 5 50 .035 (SET/KITS/TRAYS/PACK) ×4 IMPLANT
SET IRRIG TUBING LAPAROSCOPIC (IRRIGATION / IRRIGATOR) ×4 IMPLANT
SLEEVE ENDOPATH XCEL 5M (ENDOMECHANICALS) ×4 IMPLANT
SPECIMEN JAR SMALL (MISCELLANEOUS) ×4 IMPLANT
SPONGE LAP 18X18 X RAY DECT (DISPOSABLE) ×8 IMPLANT
SUT MON AB 5-0 PS2 18 (SUTURE) ×8 IMPLANT
SUT VIC AB 3-0 SH 18 (SUTURE) ×8 IMPLANT
SYR BULB 3OZ (MISCELLANEOUS) ×4 IMPLANT
SYR CONTROL 10ML LL (SYRINGE) ×4 IMPLANT
TRAY LAPAROSCOPIC MC (CUSTOM PROCEDURE TRAY) ×4 IMPLANT
TROCAR XCEL BLUNT TIP 100MML (ENDOMECHANICALS) ×4 IMPLANT
TROCAR XCEL NON-BLD 11X100MML (ENDOMECHANICALS) ×4 IMPLANT
TROCAR XCEL NON-BLD 5MMX100MML (ENDOMECHANICALS) ×4 IMPLANT
TUBE CONNECTING 12'X1/4 (SUCTIONS) ×1
TUBE CONNECTING 12X1/4 (SUCTIONS) ×3 IMPLANT
TUBING INSUFFLATION (TUBING) ×4 IMPLANT
YANKAUER SUCT BULB TIP NO VENT (SUCTIONS) ×4 IMPLANT

## 2017-08-19 NOTE — Transfer of Care (Signed)
Immediate Anesthesia Transfer of Care Note  Patient: Maria Hester  Procedure(s) Performed: LEFT BREAST LUMPECTOMY WITH RADIOACTIVE SEED AND LEFT SENTINEL LYMPH NODE BIOPSY (Left Breast) LAPAROSCOPIC CHOLECYSTECTOMY WITH INTRAOPERATIVE CHOLANGIOGRAM (N/A Abdomen)  Patient Location: PACU  Anesthesia Type:General and Regional  Level of Consciousness: awake, alert , oriented and sedated  Airway & Oxygen Therapy: Patient Spontanous Breathing and Patient connected to nasal cannula oxygen  Post-op Assessment: Report given to RN, Post -op Vital signs reviewed and stable and Patient moving all extremities  Post vital signs: Reviewed and stable  Last Vitals:  Vitals:   08/19/17 1205 08/19/17 1210  BP: (!) 94/41 (!) 95/55  Pulse:    Resp: 12 (!) 30  Temp:    SpO2: 97% 99%    Last Pain:  Vitals:   08/19/17 1119  TempSrc: Oral      Patients Stated Pain Goal: 3 (39/03/00 9233)  Complications: No apparent anesthesia complications

## 2017-08-19 NOTE — H&P (Signed)
History of Present Illness  The patient is a 51 year old female who presents with breast cancer. Patient returns for follow-up for locally advanced cancer of the left breast diagnosed in May of this year.  Original presentation was as follows: She is a post menopausal female referred by Dr. 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 she reportsfindingl a mass in her upper outer left breast several weeks ago. Her last mammogram was 2 years ago. She called for imaging but had to see her primary first due to having symptoms. She was examined and referred to the breast center for workup. Subsequent imaging included diagnostic mamogram showing a large area of pleomorphic calcifications and architectural distortion measuring about 7.5 cm in the upper breast with possibly several obscured masses and ultrasound showing a hypoechoic mass 1.4 cm at 11:00 8 cm from the nipple and at the 2:00 position 3 cm from the nipple 2 adjacent masses measuring a total of 0.8 cm. There was also noted to be generally hypoechoic heterogeneous appearing tissue widely throughout the upper outer quadrant. Axillary ultrasound revealed at least 3 prominent abnormal appearing lymph nodes. An ultrasound guided breast biopsy was performed on 03/02/2017 on 2 of the masses at 11:00 and upper outer quadrantand an abnormal lymph node. 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 She is seen now in breast multidisciplinary clinic for initial treatment planning. She has experienced the breast mass as above. Denies skin changes, nipple discharge or inversion. She does not have a personal history of any previous breast problems.  Findings at that time were the following: Tumor size: at least 7.5 cm based on calcifications and distortion cm Tumor grade: 3, Ki-67 35% Estrogen Receptor:  negative Progesterone Receptor: negative Her-2 neu: positive Lymph node status: positive  Subsequent MRI prior to starting chemotherapy revealed a 5.1 x 2.5 x 3 cm area of enhancement in the superior aspect of the left breast corresponding to biopsied malignancies as well as 2 small nodules removed from this, an 8 mm nodule in the upper inner quadrant middle depth and a 4 mm nodule in the central breast. "Several" abnormal axillary lymph nodes were noted.  She has done well with her chemotherapy with just expected side effects. She has 1 more treatment left. She has noted resolution of her left breast mass. Posttreatment MRI was done yesterday and shows complete resolution of any abnormality in her left breast and there are no abnormal lymph nodes.   Additional reasons for visit:  Abdominal pain is described as the following: She also comes in for evaluation for persistent right upper quadrant abdominal pain. This is been going on for at least a couple of months and started after her chemotherapy. She describes aching right upper quadrant abdominal pain which occurs after eating and sometimes at night. Associated with belching and nausea. She has chronic reflux for which she takes Nexium and this seems to control the reflux but not the pain. He also seems to feel like it is sticking in her upper abdomen when she eats.  She has had a CT scan of her chest that was negative except for an apparent benign stable small nodule in the liver. Gallbladder ultrasound was obtained showing gallbladder sludge and a 4 mm probable polyp in the gallbladder wall without evidence of cholecystitis. Upper endoscopy was performed showing some mild to moderate distal esophagitis but no  other abnormalities. Her pain continues.   Problem List/Past Medical  CANCER OF LEFT BREAST, STAGE 3 (C50.912)  RIGHT UPPER QUADRANT PAIN (R10.11)   Past Surgical History  Cataract Surgery  Bilateral. Hysterectomy (not  due to cancer) - Complete  Oral Surgery   Diagnostic Studies History  Colonoscopy  never Mammogram  within last year Pap Smear  1-5 years ago  Allergies  No Known Drug Allergies 07/16/2017 Allergies Reconciled   Medication History LORazepam (0.'5MG'$  Tablet, Oral) Active. Dexamethasone ('4MG'$  Tablet, Oral) Active. Ondansetron HCl ('8MG'$  Tablet, Oral) Active. Lidocaine-Prilocaine (2.5-2.5% Cream, External) Active. Prochlorperazine Maleate ('10MG'$  Tablet, Oral) Active. MetroNIDAZOLE (1% Gel, External) Active. Esomeprazole Magnesium ('40MG'$  Capsule DR, Oral) Active. Claritin ('10MG'$  Tablet, Oral) Active. Xanax (0.'5MG'$  Tablet, Oral) Active. Medications Reconciled  Social History  Caffeine use  Carbonated beverages, Coffee, Tea. Tobacco use  Current some day smoker.  Family History  Diabetes Mellitus  Father. Heart Disease  Father, Mother. Hypertension  Mother.  Pregnancy / Birth History  Age at menarche  37 years. Contraceptive History  Oral contraceptives. Gravida  4 Maternal age  52-20 Para  2 Regular periods   Other Problems  General anesthesia - complications  Hypercholesterolemia  Thyroid Disease   BP 93/61   Pulse 61   Temp 98.1 F (36.7 C) (Oral)   Resp 18   Ht '5\' 5"'$  (1.651 m)   Wt 88.5 kg (195 lb)   LMP 02/11/2014   SpO2 96%   BMI 32.45 kg/m   Physical Exam  The physical exam findings are as follows: Note:General: Alert, well-developed and well nourished Caucasian female, in no distress Skin: Warm and dry without rash or infection. Alopecia HEENT: No palpable masses or thyromegaly. Sclera nonicteric. Pupils equal round and reactive. Lymph nodes: No cervical, supraclavicular, nodes palpable. Breasts: I cannot feel any masses in either breast. No skin changes or nipple crusting. No palpable axillary adenopathy. Lungs: Breath sounds clear and equal. No wheezing or increased work of breathing. Cardiovascular: Regular rate and rhythm  without murmer. No JVD or edema. Abdomen: Nondistended. Again noted moderate localized right upper quadrant tenderness. No masses palpable. No organomegaly. No palpable hernias. Extremities: No edema or joint swelling or deformity. No chronic venous stasis changes. Neurologic: Alert and fully oriented. Gait normal. No focal weakness. Psychiatric: Normal mood and affect. Thought content appropriate with normal judgement and insight    Assessment & Plan Marland Kitchen T. Shawnique Mariotti MD; 07/16/2017 9:22 AM) RIGHT UPPER QUADRANT PAIN (R10.11) Impression: Persistent right upper quadrant pain with tenderness and symptoms suggestive for cholecystitis. Ultrasound did show sludge and an apparent gallbladder polyp. CT scan and endoscopy did not really show any other source for her pain I suspect that she has chronic cholecystitis. I discussed options of further workup with HIDA scan. The patient believes her symptoms are really not tolerable as they are and therefore I think proceeding with cholecystectomy with high believe a very good chance of relieving her symptoms is reasonable. I discussed laparoscopic cholecystectomy including its nature and recovery as well as risks of bleeding, infection, visceral injury, bile leak or bile duct injury and possible need for open surgery. She was given literature. She agrees to proceed. CANCER OF LEFT BREAST, STAGE 3 (C50.912) Impression: 51 year old female with a diagnosis of cancer of the left breast, upper outer quadrant. Clinical stage IIIa, ER negative, PR negative, HER-2 positive. Excellent response to chemotherapy today with resolution of the palpable abnormality and complete imaging response on follow-up MRI. Patient strongly desires breast conservation  and I think with this excellent response on MRI that this is a reasonable approach. We discussed proceeding with radioactive seed localized left breast lumpectomy and RSL targeted axillary sentinel lymph node biopsy. Discussed  the nature of the surgery and potential complications including bleeding and infection and possible need for further surgery based on final pathology. Slight risk of lymphedema discussed. All her questions answered. Current Plans Schedule for Surgery Radioactive seed localized left breast lumpectomy and radioactive seed targeted left axillary sentinel lymph node biopsy, laparoscopic cholecystectomy with cholangiogram.

## 2017-08-19 NOTE — Interval H&P Note (Signed)
History and Physical Interval Note:  08/19/2017 11:58 AM  Maria Hester  has presented today for surgery, with the diagnosis of CHOLECYSTITIS, LEFT BREAST CANCER  The various methods of treatment have been discussed with the patient and family. After consideration of risks, benefits and other options for treatment, the patient has consented to  Procedure(s): LEFT BREAST LUMPECTOMY WITH RADIOACTIVE SEED AND LEFT SENTINEL LYMPH NODE BIOPSY (Left) LAPAROSCOPIC CHOLECYSTECTOMY WITH INTRAOPERATIVE CHOLANGIOGRAM (N/A) as a surgical intervention .  The patient's history has been reviewed, patient examined, no change in status, stable for surgery.  I have reviewed the patient's chart and labs.  Questions were answered to the patient's satisfaction.     [name] T

## 2017-08-19 NOTE — Op Note (Signed)
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  Chronic cholecystitis  Postoprative Diagnosis: Same  Procedure: Procedure(s): Blue dye injection left breast, LEFT BREAST LUMPECTOMY WITH RADIOACTIVE SEED AND LEFT RADIOACTIVE SEED GUIDED DEEP AXILLARY SENTINEL LYMPH NODE BIOPSY  LAPAROSCOPIC CHOLECYSTECTOMY WITH INTRAOPERATIVE CHOLANGIOGRAM   Surgeon: Excell Seltzer T   Assistants: None  Anesthesia:  General endotracheal anesthesia  Indications: Patient is a 51 year old female who initially presented about 5 months ago with a large mass in the upper outer left breast with imaging showing an approximately 7 cm mass and 3 abnormal appearing lymph nodes in the axilla with biopsy of the breast and axilla showing 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  The breast appeared to be possibly 2 or 3 confluent masses.  Patient underwent neoadjuvant chemotherapy with marked clinical response including complete resolution of the palpable breast mass and posttreatment MRI showing no abnormalities remaining in the breast and normal-appearing axillary lymph nodes.  We have extensively discussed options including total mastectomy versus breast conservation and she is strongly interested in breast conservation.  Due to the marked response from chemotherapy we have elected to pursue this.  Preoperative imaging did show still about a 5 cm area of calcifications in the upper outer left breast and these were bracketed with 3 radioactive seeds.  In addition a radioactive seed was placed in the left axilla to attempt to localize the previously biopsied positive lymph node although apparently the clip was somewhat difficult to visualize at the time of seed placement.  We have also discussed management of her axilla and as she had only 3 somewhat abnormal appearing lymph nodes which have completely resolved we have elected to proceed with targeted axillary  sentinel lymph node biopsy.  The procedure and risks have been discussed in detail including possible need for further surgery up to and including total mastectomy and axillary dissection. During the end of her chemotherapy the patient has developed persistent and recurrent epigastric and right upper quadrant abdominal pain postprandial.  Workup is included a negative CT scan of the abdomen and endoscopy showing only mild esophagitis.  Ultrasound of the gallbladder has revealed a gallbladder polyp and sludge.  She is failed to respond to nonoperative management for pain and after further discussion we plan also to proceed with laparoscopic cholecystectomy with cholangiogram in an effort to relieve her abdominal pain.  This procedure as well as been discussed in detail including risks documented elsewhere and she agrees to proceed.   Procedure Detail: As noted above patient has previously undergone placement of 3 radioactive seeds in the breast bracketing previously placed 2 biopsy clips and the posterior extent of the calcifications in the upper outer quadrant and radioactive seed has been placed in the left axilla to attempt to localize the previously biopsied left axillary lymph node.  In the holding area she underwent a pectoral block and underwent injection of 1 mCi of technetium sulfur colloid in the periareolar skin.  She was taken to the operating room, placed in the supine position on the operating table, and general endotracheal anesthesia induced.  Under sterile technique after patient timeout 5 cc of dilute methylene blue was injected subcutaneously beneath the left nipple and massaged for several minutes.  The entire anterior chest and abdomen, left axilla and upper arm were widely sterilely prepped and draped.  She had received preoperative IV antibiotics.  PAS were in place.  Patient timeout was performed and correct  procedure again verified. Attention was turned to the lumpectomy initially.  I  was able to localize the 2 anterior seeds which were close to the skin in the upper outer quadrant and bracket of the previously placed biopsy clips at the inferior lateral and superior medial extent of the calcifications.  I used a circumareolar incision in the upper and lateral breast.  Skin and subcutaneous flap was then raised laterally and superiorly over the areas of the radioactive seeds.  After developing the flaps however the more inferior seed was actually found to be in the very superficial subcutaneous tissue.  I did a fairly wide excision of the subcutaneous tissue up to the skin and this was marked as the anterior margin of the lumpectomy.  X-ray confirmed the seed but not the marking clip within the specimen.  The more superior seed was localized after development of the flap.  Using these 2 areas as the superior and inferior extent of the lumpectomy dissection was deepened down into the breast tissue surrounding both these areas and I extended this posteriorly and widened the dissection out.  The dissection then continued posteriorly until I definitely localized the posterior seed which bracketed the posterior extent of the calcifications.  At this point I came underneath this seed with the dissection of the lumpectomy and the specimen was removed.  This was a globular specimen which measured about 7 cm in diameter.  There was some dense breast tissue intermittently in the upper outer breast but I did not encounter any discrete mass.  The specimen was inked for margins although again the anterior or skin margin had been taken as a separate specimen.  Specimen x-ray showed the 2 remaining seeds and the 2 marking clips contained within the specimen.  This was sent for permanent pathology.  The wound was thoroughly irrigated and hemostasis obtained.  The lumpectomy cavity was marked with clips.  I mobilized the breast tissue from the more inferior breast from underneath the nipple and from off the chest  wall and also mobilized some lateral tissue off the chest wall to bring into the defect.  The deep breast and subcutaneous tissue was then closed with interrupted 3-0 Vicryls.  Attention was turned to the axilla.  The radioactive seed was localized in the axilla.  I made a transverse incision in a skin crease, fairly generous incision to allow for thorough exploration of the axilla.  Dissection was carried down through the subcutaneous tissue.  The clavipectoral fascia was incised.  Using the neoprobe for guidance I localized the seed.  I did not feel any definite mass around this.  As I dissected there was a slightly enlarged and somewhat friable lymph node just superior and posterior to the seed.  A fairly generous specimen of tissue was excised with the cautery around the seat and to include this lymph node.  Ex vivo the identifiable lymph node had high counts with technetium as well.  This lymph node was separated from the specimen.  X-ray of the tissue containing the seed confirmed the seed placement but not the marking clip.  I x-rayed the lymph nodes separately which also did not contain the marking clip.  At this point a thorough exploration of the axilla was performed with careful palpation and examination for blue dye and 4 nodes with high counts.  This yielded a total of 7 more lymph nodes which had either elevated counts in the range of [phone] and 1 of these also had blue dye.  None were grossly pathologic.  Each specimen was x-rayed and I did not see the hydro-Mark clip.  At this point the axilla had been thoroughly explored.  I could not feel any other lymph nodes enlarged or otherwise and there were no significant counts or blue dye identified.  I did not feel any further dissection to try to identify the hydro-Mark clip was indicated is very likely it could have been displaced during the dissection.  The axilla was thoroughly irrigated and complete hemostasis assured.  The deep axillary and  subcutaneous tissue was closed with interrupted 3-0 Vicryl.  Soft tissue of both incisions was infiltrated with Marcaine and the skin closed with running subcuticular 4-0 Monocryl and Dermabond.  Attention was then turned to the cholecystectomy.  Trocar sites were infiltrated with local anesthesia.  Access was obtained with a 1/2 cm incision at the umbilicus with an open Hassan technique through a mattress suture of 0 Vicryl and pneumoperitoneum established.  Under direct vision an 11 mm trocar was placed subxiphoid and 2 5 mm trochars along the right subcostal margin.  The gallbladder was visualized and was somewhat distended and slightly thickened but not severely inflamed.  There were some omental adhesions up to the undersurface of the liver and to the lateral edge of the gallbladder that were carefully taken down with hook cautery.  The fundus was then grasped and elevated up over the liver and the infundibulum retracted inferolaterally.  The anterior and posterior peritoneum over close triangle was incised and fibrofatty tissue was stripped and off the neck of the gallbladder.  The cystic duct was dissected out over at least a centimeter in the cystic duct gallbladder junction dissected 360 degrees.  The cystic artery was seen in close triangle coursing up of the gallbladder wall.  When the anatomy appeared clear if the cystic duct was clipped of the gallbladder junction and an operative cholangiogram obtained.  This showed good filling of a normal common bile duct and intrahepatic ducts with free flow into the duodenum and no filling defects.  Following this the Adair cath was removed and the cystic duct was triply clipped proximally and divided.  The cystic artery was doubly clipped proximal only clipped distally and divided.  The gallbladder was then dissected free from its bed using hook cautery, placed in an Endo Catch bag and brought out through the umbilical incision.  The right upper quadrant was  thoroughly irrigated and complete hemostasis assured.  There was no evidence of bleeding or injury or other problems.  All CO2 was evacuated and the mattress suture secured at the umbilicus.  Skin incisions were closed with subcuticular Monocryl and Dermabond.  Sponge needle and instrument counts were correct.    Findings: As above  Estimated Blood Loss:  less than 50 mL         Drains: None  Blood Given: none          Specimens: #1 anterior margin left breast lumpectomy  inked  #2 left breast lumpectomy margins inked.  # 3 through 11 left axillary seed localized and sentinel lymph nodes   #12 gallbladder and contents       Complications:  * No complications entered in OR log *         Disposition: PACU - hemodynamically stable.         Condition: stable

## 2017-08-19 NOTE — Anesthesia Procedure Notes (Signed)
Procedure Name: Intubation Date/Time: 08/19/2017 12:37 PM Performed by: Scheryl Darter, CRNA Pre-anesthesia Checklist: Patient identified, Emergency Drugs available, Suction available and Patient being monitored Patient Re-evaluated:Patient Re-evaluated prior to induction Oxygen Delivery Method: Circle System Utilized Preoxygenation: Pre-oxygenation with 100% oxygen Induction Type: IV induction Ventilation: Mask ventilation without difficulty Laryngoscope Size: Miller and 2 Grade View: Grade I Tube type: Oral Tube size: 7.5 mm Number of attempts: 1 Airway Equipment and Method: Stylet and Oral airway Placement Confirmation: ETT inserted through vocal cords under direct vision,  positive ETCO2 and breath sounds checked- equal and bilateral Secured at: 22 cm Tube secured with: Tape Dental Injury: Teeth and Oropharynx as per pre-operative assessment

## 2017-08-19 NOTE — Anesthesia Procedure Notes (Addendum)
Anesthesia Regional Block: Pectoralis block   Pre-Anesthetic Checklist: ,, timeout performed, Correct Patient, Correct Site, Correct Laterality, Correct Procedure, Correct Position, site marked, Risks and benefits discussed,  Surgical consent,  Pre-op evaluation,  At surgeon's request and post-op pain management  Laterality: Left and Upper  Prep: chloraprep       Needles:  Injection technique: Single-shot  Needle Type: Echogenic Stimulator Needle     Needle Length: 9cm  Needle Gauge: 21   Needle insertion depth: 6 cm   Additional Needles:   Procedures:,,,, ultrasound used (permanent image in chart),,,,  Narrative:  Start time: 08/19/2017 11:50 AM End time: 08/19/2017 12:05 PM Injection made incrementally with aspirations every 5 mL.  Performed by: Personally  Anesthesiologist: Rica Koyanagi, MD

## 2017-08-19 NOTE — Anesthesia Preprocedure Evaluation (Signed)
Anesthesia Evaluation  Patient identified by MRN, date of birth, ID band Patient awake    Reviewed: Allergy & Precautions, NPO status , Patient's Chart, lab work & pertinent test results  Airway Mallampati: II       Dental   Pulmonary former smoker,    breath sounds clear to auscultation       Cardiovascular  Rhythm:Regular Rate:Abnormal     Neuro/Psych    GI/Hepatic Neg liver ROS, hiatal hernia,   Endo/Other    Renal/GU negative Renal ROS     Musculoskeletal   Abdominal   Peds  Hematology negative hematology ROS (+)   Anesthesia Other Findings   Reproductive/Obstetrics                             Anesthesia Physical Anesthesia Plan  ASA: II  Anesthesia Plan: General   Post-op Pain Management:  Regional for Post-op pain   Induction: Intravenous  PONV Risk Score and Plan: 4 or greater and Ondansetron, Dexamethasone and Midazolam  Airway Management Planned: Oral ETT  Additional Equipment:   Intra-op Plan:   Post-operative Plan: Extubation in OR  Informed Consent: I have reviewed the patients History and Physical, chart, labs and discussed the procedure including the risks, benefits and alternatives for the proposed anesthesia with the patient or authorized representative who has indicated his/her understanding and acceptance.   Dental advisory given  Plan Discussed with: CRNA  Anesthesia Plan Comments:         Anesthesia Quick Evaluation

## 2017-08-19 NOTE — Discharge Instructions (Signed)
[facility] °Office Phone Number [phone] ° °BREAST BIOPSY/ PARTIAL MASTECTOMY: POST OP INSTRUCTIONS ° °Always review your discharge instruction sheet given to you by the facility where your surgery was performed. ° °IF YOU HAVE DISABILITY OR FAMILY LEAVE FORMS, YOU MUST BRING THEM TO THE OFFICE FOR PROCESSING.  DO NOT GIVE THEM TO YOUR DOCTOR. ° °1. A prescription for pain medication may be given to you upon discharge.  Take your pain medication as prescribed, if needed.  If narcotic pain medicine is not needed, then you may take acetaminophen (Tylenol) or ibuprofen (Advil) as needed. °2. Take your usually prescribed medications unless otherwise directed °3. If you need a refill on your pain medication, please contact your pharmacy.  They will contact our office to request authorization.  Prescriptions will not be filled after 5pm or on week-ends. °4. You should eat very light the first 24 hours after surgery, such as soup, crackers, pudding, etc.  Resume your normal diet the day after surgery. °5. Most patients will experience some swelling and bruising in the breast.  Ice packs and a good support bra will help.  Swelling and bruising can take several days to resolve.  °6. It is common to experience some constipation if taking pain medication after surgery.  Increasing fluid intake and taking a stool softener will usually help or prevent this problem from occurring.  A mild laxative (Milk of Magnesia or Miralax) should be taken according to package directions if there are no bowel movements after 48 hours. °7. Unless discharge instructions indicate otherwise, you may remove your bandages 24-48 hours after surgery, and you may shower at that time.  You may have steri-strips (small skin tapes) in place directly over the incision.  These strips should be left on the skin for 7-10 days.  If your surgeon used skin glue on the incision, you may shower in 24 hours.  The glue will flake off over the  next 2-3 weeks.  Any sutures or staples will be removed at the office during your follow-up visit. °8. ACTIVITIES:  You may resume regular daily activities (gradually increasing) beginning the next day.  Wearing a good support bra or sports bra minimizes pain and swelling.  You may have sexual intercourse when it is comfortable. °a. You may drive when you no longer are taking prescription pain medication, you can comfortably wear a seatbelt, and you can safely maneuver your car and apply brakes. °b. RETURN TO WORK:  ______________________________________________________________________________________ °9. You should see your doctor in the office for a follow-up appointment approximately two weeks after your surgery.  Your doctor’s nurse will typically make your follow-up appointment when she calls you with your pathology report.  Expect your pathology report 2-3 business days after your surgery.  You may call to check if you do not hear from us after three days. °10. OTHER INSTRUCTIONS: _______________________________________________________________________________________________ _____________________________________________________________________________________________________________________________________ °_____________________________________________________________________________________________________________________________________ °_____________________________________________________________________________________________________________________________________ ° °WHEN TO CALL YOUR DOCTOR: °1. Fever over 101.0 °2. Nausea and/or vomiting. °3. Extreme swelling or bruising. °4. Continued bleeding from incision. °5. Increased pain, redness, or drainage from the incision. ° °The clinic staff is available to answer your questions during regular business hours.  Please don’t hesitate to call and ask to speak to one of the nurses for clinical concerns.  If you have a medical emergency, go to the nearest  emergency room or call 911.  A surgeon from [facility] is always on call at the hospital. ° °For further questions, please visit [email or web address]  ° ° °  [facility] ______[facility] LAPAROSCOPIC SURGERY: POST OP INSTRUCTIONS Always review your discharge instruction sheet given to you by the facility where your surgery was performed. IF YOU HAVE DISABILITY OR FAMILY LEAVE FORMS, YOU MUST BRING THEM TO THE OFFICE FOR PROCESSING.   DO NOT GIVE THEM TO YOUR DOCTOR.  1. A prescription for pain medication may be given to you upon discharge.  Take your pain medication as prescribed, if needed.  If narcotic pain medicine is not needed, then you may take acetaminophen (Tylenol) or ibuprofen (Advil) as needed. 2. Take your usually prescribed medications unless otherwise directed. 3. If you need a refill on your pain medication, please contact your pharmacy.  They will contact our office to request authorization. Prescriptions will not be filled after 5pm or on week-ends. 4. You should follow a light diet the first few days after arrival home, such as soup and crackers, etc.  Be sure to include lots of fluids daily. 5. Most patients will experience some swelling and bruising in the area of the incisions.  Ice packs will help.  Swelling and bruising can take several days to resolve.  6. It is common to experience some constipation if taking pain medication after surgery.  Increasing fluid intake and taking a stool softener (such as Colace) will usually help or prevent this problem from occurring.  A mild laxative (Milk of Magnesia or Miralax) should be taken according to package instructions if there are no bowel movements after 48 hours. 7. Unless discharge instructions indicate otherwise, you may remove your bandages 24-48 hours after surgery, and you may shower at that time.  You may have steri-strips (small skin tapes) in place directly over the incision.  These strips should  be left on the skin for 7-10 days.  If your surgeon used skin glue on the incision, you may shower in 24 hours.  The glue will flake off over the next 2-3 weeks.  Any sutures or staples will be removed at the office during your follow-up visit. 8. ACTIVITIES:  You may resume regular (light) daily activities beginning the next day--such as daily self-care, walking, climbing stairs--gradually increasing activities as tolerated.  You may have sexual intercourse when it is comfortable.  Refrain from any heavy lifting or straining until approved by your doctor. a. You may drive when you are no longer taking prescription pain medication, you can comfortably wear a seatbelt, and you can safely maneuver your car and apply brakes. b. RETURN TO WORK:  __________________________________________________________ 9. You should see your doctor in the office for a follow-up appointment approximately 2-3 weeks after your surgery.  Make sure that you call for this appointment within a day or two after you arrive home to insure a convenient appointment time. 10. OTHER INSTRUCTIONS: __________________________________________________________________________________________________________________________ __________________________________________________________________________________________________________________________ WHEN TO CALL YOUR DOCTOR: 6. Fever over 101.0 7. Inability to urinate 8. Continued bleeding from incision. 9. Increased pain, redness, or drainage from the incision. 10. Increasing abdominal pain  The clinic staff is available to answer your questions during regular business hours.  Please don't hesitate to call and ask to speak to one of the nurses for clinical concerns.  If you have a medical emergency, go to the nearest emergency room or call 911.  A surgeon from [facility] is always on call at the hospital. [address], [address], [place], [place]  [address] ? [address] [address],  [place], [place]   [address] [phone] ? [phone] ? FAX [phone] Web   site: [email or web address]  

## 2017-08-20 ENCOUNTER — Encounter (HOSPITAL_COMMUNITY): Payer: Self-pay | Admitting: General Surgery

## 2017-08-20 NOTE — Anesthesia Postprocedure Evaluation (Signed)
Anesthesia Post Note  Patient: Maria Hester  Procedure(s) Performed: LEFT BREAST LUMPECTOMY WITH RADIOACTIVE SEED AND LEFT SENTINEL LYMPH NODE BIOPSY (Left Breast) LAPAROSCOPIC CHOLECYSTECTOMY WITH INTRAOPERATIVE CHOLANGIOGRAM (N/A Abdomen)     Patient location during evaluation: PACU Anesthesia Type: Regional Level of consciousness: awake and alert Pain management: pain level controlled Vital Signs Assessment: post-procedure vital signs reviewed and stable Respiratory status: spontaneous breathing, nonlabored ventilation, respiratory function stable and patient connected to nasal cannula oxygen Cardiovascular status: blood pressure returned to baseline and stable Postop Assessment: no apparent nausea or vomiting Anesthetic complications: no    Last Vitals:  Vitals:   08/19/17 1715 08/19/17 1730  BP: (!) 104/55 (!) 98/55  Pulse: (!) 58 70  Resp: 14 (!) 21  Temp:  36.4 C  SpO2: 91% 94%    Last Pain:  Vitals:   08/19/17 1730  TempSrc:   PainSc: 0-No pain                 Liandra Mendia,JAMES TERRILL

## 2017-08-26 NOTE — Progress Notes (Signed)
Location of Breast Cancer: Left Breast  Histology per Pathology Report:  03/02/17 Diagnosis 1. 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 - DUCTAL CARCINOMA IN SITU. - LYMPHOVASCULAR INVASION IS IDENTIFIED. - SEE COMMENT. 2. Lymph node, needle/core biopsy, left axilla - METASTATIC CARCINOMA IN 1 OF 1 LYMPH NODE (1/1). 3. 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  Receptor Status: ER(NEG), PR (NEG), Her2-neu (POS), Ki-(35%)  08/19/17 Diagnosis 1. Breast, excision, Left w/seed - BENIGN BREAST TISSUE. 2. 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 - HIGH GRADE INVASIVE DUCTAL CARCINOMA. - MARGINS ARE NEGATIVE FOR CARCINOMA 3. Lymph node, sentinel, biopsy, Left Axillary w/seed - ONE OF ONE LYMPH NODES NEGATIVE FOR CARCINOMA (0/1). 4. Lymph node, sentinel, biopsy, Left Axillary #1 - ONE OF ONE LYMPH NODES NEGATIVE FOR CARCINOMA (0/1). 5. Lymph node, sentinel, biopsy, Left Axillary #2 - ONE OF ONE LYMPH NODES NEGATIVE FOR CARCINOMA (0/1). 6. Lymph node, sentinel, biopsy, Left Axillary #3 - ONE OF ONE LYMPH NODES NEGATIVE FOR CARCINOMA (0/1). 7. Lymph node, sentinel, biopsy, Left Axillary #4 - ONE OF ONE LYMPH NODES NEGATIVE FOR CARCINOMA (0/1). 8. Lymph node, sentinel, biopsy, Left Axillary #5 - ONE OF ONE LYMPH NODES NEGATIVE FOR CARCINOMA (0/1). 9. Lymph node, sentinel, biopsy, Left Axillary #6 - ONE OF ONE LYMPH NODES NEGATIVE FOR CARCINOMA (0/1). 10. Lymph node, sentinel, biopsy, Left Axillary #7 - ONE OF ONE LYMPH NODES NEGATIVE FOR CARCINOMA (0/1). 11. Lymph node, sentinel, biopsy, Left Axillary #8 - ONE OF ONE LYMPH NODES NEGATIVE FOR CARCINOMA (0/1). 12. Gallbladder - CHRONIC CHOLECYSTITIS.  Did patient present with symptoms or was this found on screening mammography?: She self palpated the left breast  mass.   Past/Anticipated interventions by surgeon, if any: 08/19/17 Procedure: Procedure(s): Blue dye injection left breast, LEFT BREAST LUMPECTOMY WITH RADIOACTIVE SEED AND LEFT RADIOACTIVE SEED GUIDED DEEP AXILLARY SENTINEL LYMPH NODE BIOPSY  LAPAROSCOPIC CHOLECYSTECTOMY WITH INTRAOPERATIVE CHOLANGIOGRAM   Surgeon: Excell Seltzer T    Past/Anticipated interventions by medical oncology, if any: Dr. Jana Hakim 07/20/17 (2) neoadjuvant chemotherapy consisting of carboplatin and Docetaxel given with trastuzumab and Pertuzumab every 21 days for 6 cycles, starting 03/31/2017             (a) Pertuzumab stopped after cycle 1 secondary to side effects                                (b) Pertuzumab resumed cycle 3, not at loading dose. --Chemotherapy completed 07/14/17  (3) trastuzumab will be continued to complete 6 months             (a) echocardiogram 03/20/2017 showed an ejection fraction of 55%              (4) definitive surgery to follow chemotherapy (surgery completed 08/19/17)  (5) adjuvant radiation to follow surgery  Her next appointment with Dr. Jana Hakim is 09/01/17 along with her Herceptin infusion.   Lymphedema issues, if any:  She denies. She has good arm mobility.   Pain issues, if any:  She denies  SAFETY ISSUES:  Prior radiation? No  Pacemaker/ICD? no  Possible current pregnancy? No she has had a hysterectomy  Is the patient on methotrexate? No  Current Complaints / other details:    BP (!) 107/48   Pulse (!) 53   Temp 98.1  F (36.7 C)   Ht 5' 5"  (1.651 m)   Wt 197 lb 6.4 oz (89.5 kg)   LMP 02/11/2014   SpO2 100% Comment: room air  BMI 32.85 kg/m    Wt Readings from Last 3 Encounters:  09/01/17 197 lb 6.4 oz (89.5 kg)  08/19/17 195 lb (88.5 kg)  08/13/17 195 lb 3 oz (88.5 kg)      Teighlor Korson, Stephani Police, RN 08/26/2017,2:38 PM

## 2017-08-31 NOTE — Progress Notes (Signed)
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  She continues on trastuzumab, with a dose due today.  She tolerates that with no side effects that she is aware of.  She will be due for repeat echocardiogram in December  Since her last visit to the office, she underwent left breast lumpectomy ([id number]) on 08/19/2017 with results showing: Invasive ductal carcinoma, grade 2, spanning 0.2 cm.  There was high-grade ductal carcinoma in situ (the pathology report is to be corrected; Dr. Tresa Moore alerted).  Margins are negative and ample both for invasive and noninvasive disease.  0 of 9 lymph nodes were involved.  She underwent cholecystectomy on the same day, showing only chronic cholecystitis on path.  She met with Dr. Isidore Moos earlier today to plan out her radiation treatments  REVIEW OF SYSTEMS: Malay did terrific with her surgery, essentially had no pain, no bleeding, and no fever.  She is having some discomfort now in the left axilla, not so much in the breast.  She is walking about twice a day, but only for about 10 minutes.  She has not yet been referred to physical therapy.  A detailed review of systems today was otherwise stable  BREAST CANCER HISTORY: From the original intake  note:  Maria Hester herself noted a change in her left breast when looking into a mirror and then palpating a mass in the upper-outer quadrant. She brought this to medical attention and on 02/24/2017 underwent bilateral diagnostic mammography with tomography and left breast ultrasonography at the breast Center. The breast density was category C. In the upper portion of the left breast there was a broad area of distortion measuring up to 7.5 cm. On exam there was a broad firm area involving the upper outer quadrant of the left breast which was visibly protruding. By ultrasound at the 11:00 axis 8 cm from the nipple there was an irregular hypoechoic mass measuring 1.4 cm with additiona ases at 2:00, 3 cm from the nipple and multiple other masses as described, all in the upper-outer quadrant primarily. Ultrasound of the left axilla showed at least 3 prominent lymph nodes one of which had a cortical bulge.  On 03/02/2017 the patient underwent biopsy of a left breast mass at the 11:30 o'clock position and a second mass described as upper outer quadrant as well as one of the suspicious left axillary lymph nodes. These all showed invasive ductal carcinoma, grade 3. Prognostic panel from one of the 2 masses showed the tumor to be estrogen and progesterone receptor negative, but HER-2 amplified, with a signals ratio of 8.24 and the number per cell 15.65.  Her subsequent history is as detailed below   PAST MEDICAL HISTORY: Past Medical History:  Diagnosis Date  . Breast cancer ([place])   . 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 Cataracts, bilateral   . Complication of  anesthesia    blood pressure very low after surgery difficult to wake -1st surgery only-when pt was 51 y.o.  . Family history of breast cancer   . Gallbladder disease    "sludge in gallbladder",   . History of hiatal hernia   . Hyperlipidemia   . Uterine fibroid     PAST SURGICAL  HISTORY: Past Surgical History:  Procedure Laterality Date  . ABDOMINAL HYSTERECTOMY    . ABDOMINOPLASTY N/A 02/28/2014   Procedure: ABDOMINOPLASTY with Panniculectomy;  Surgeon: [name], MD;  Location: [facility];  Service: Gynecology;  Laterality: N/A;  . APPENDECTOMY    . BILATERAL SALPINGECTOMY Bilateral 02/28/2014   Procedure: BILATERAL SALPINGECTOMY;  Surgeon: [name], MD;  Location: [facility];  Service: Gynecology;  Laterality: Bilateral;  . BREAST LUMPECTOMY WITH RADIOACTIVE SEED AND SENTINEL LYMPH NODE BIOPSY Left 08/19/2017   Procedure: LEFT BREAST LUMPECTOMY WITH RADIOACTIVE SEED AND LEFT SENTINEL LYMPH NODE BIOPSY;  Surgeon: Excell Seltzer, MD;  Location: [facility];  Service: General;  Laterality: Left;  . CATARACT EXTRACTION, BILATERAL    . CHOLECYSTECTOMY N/A 08/19/2017   Procedure: LAPAROSCOPIC CHOLECYSTECTOMY WITH INTRAOPERATIVE CHOLANGIOGRAM;  Surgeon: Excell Seltzer, MD;  Location: [facility];  Service: General;  Laterality: N/A;  . PORTACATH PLACEMENT Right 03/18/2017   Procedure: INSERTION PORT-A-CATH;  Surgeon: Excell Seltzer, MD;  Location: [facility];  Service: General;  Laterality: Right;  . reverse tubal ligation    . SCAR REVISION N/A 02/28/2014   Procedure: SCAR REVISION;  Surgeon: [name], MD;  Location: [facility];  Service: Gynecology;  Laterality: N/A;  . SUPRACERVICAL ABDOMINAL HYSTERECTOMY N/A 02/28/2014   Procedure: HYSTERECTOMY SUPRACERVICAL ABDOMINAL;  Surgeon: [name], MD;  Location: [facility];  Service: Gynecology;  Laterality: N/A;  . TUBAL LIGATION    . WISDOM TOOTH EXTRACTION      FAMILY HISTORY Family History  Problem Relation Age of Onset  . Heart disease Mother   . Hypertension Mother   . Diabetes Father   . Heart disease Father   . Heart disease Maternal Grandmother   . Colon cancer Maternal Grandmother 78       died in her 80's  . Heart disease Maternal Grandfather   . Esophageal cancer Maternal Grandfather 50       died at  83  . Diabetes Paternal Grandmother   . Heart disease Paternal Grandmother   . Kidney cancer Paternal Grandmother 53       died in her 47's  . Tuberculosis Paternal Grandfather   . Heart disease Paternal Grandfather   . Skin cancer Paternal Grandfather 3       died at 55, patient not sure if it was melanoma, BCC, Squamous, etc.  . Breast cancer Maternal Aunt 50       died at 53  . Breast cancer Cousin 3       she is now in her 25's  . Rectal cancer Neg Hx   . Stomach cancer Neg Hx   The patient's father died from heart disease at age 79. The patient's mother is living at age 7. The patient has one brother, 2 sisters. On the mother's side and aunt had breast cancer at age 79 and her daughter had breast cancer in her early 14s. There is also a grandfather with esophageal cancer and a grandmother with colon cancer. On the paternal side there is a grandmother with kidney cancer at an early age and a grandfather with melanoma at an early  age.  GYNECOLOGIC HISTORY:  Patient's last menstrual period was 02/11/2014.  Menarche age 8, first live birth age 74, the patient is Swisher P2. She underwent hysterectomy without salpingo-oophorectomy May 2016. She did not use hormone replacement. She did use oral contraceptives approximately 10 years in the 1980s.  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 Her husband Fish farm manager in the same [place], which is a 10% aviation enrollment. Daughter Lorenda Hatchet is a hairstylist in [place] and son Chip Boer is a Physiological scientist in [place] the patient has 2 grandsons and one granddaughter. She attends a [employer]    ADVANCED DIRECTIVES: The patient has completed advanced directives and will have the motorized at the next visit   HEALTH MAINTENANCE: Social History   Tobacco Use  . Smoking status: Former Smoker    Packs/day: 0.50    Years: 34.00    Pack years: 17.00     Types: Cigarettes    Last attempt to quit: 10/13/2013    Years since quitting: 3.8  . Smokeless tobacco: Never Used  Substance Use Topics  . Alcohol use: No    Comment: social drink   . Drug use: No     Colonoscopy:No  PAP: 03/04/2017  Bone density: No   No Known Allergies  Current Outpatient Medications  Medication Sig Dispense Refill  . acetaminophen (TYLENOL) 500 MG tablet Take 500 mg by mouth daily as needed for moderate pain or headache.    Marland Kitchen acyclovir (ZOVIRAX) 400 MG tablet Take 1 tablet (400 mg total) by mouth 5 (five) times daily. (Patient not taking: Reported on 09/01/2017) 105 tablet 0  . ALPRAZolam (XANAX) 0.5 MG tablet TAKE HALF TO 1 TABLET BY MOUTH DAILY AS NEEDED FOR ANXIETY  2  . bismuth subsalicylate (PEPTO BISMOL) 262 MG/15ML suspension Take 30 mLs by mouth every 6 (six) hours as needed for indigestion.    Marland Kitchen esomeprazole (NEXIUM) 40 MG capsule Take 1 capsule (40 mg total) by mouth 2 (two) times daily before a meal. (Patient taking differently: Take 40 mg by mouth daily. ) 60 capsule 2  . lidocaine-prilocaine (EMLA) cream Apply 1 application topically as needed (port access).    . ondansetron (ZOFRAN) 4 MG tablet Take 1 tablet (4 mg total) every 8 (eight) hours as needed by mouth for nausea or vomiting. (Patient not taking: Reported on 09/01/2017) 10 tablet 1  . oxyCODONE (OXY IR/ROXICODONE) 5 MG immediate release tablet Take 1 tablet (5 mg total) every 6 (six) hours as needed by mouth for severe pain. (Patient not taking: Reported on 09/01/2017) 15 tablet 0  . rosuvastatin (CRESTOR) 10 MG tablet Take 10 mg by mouth every other day. At bedtime  0   No current facility-administered medications for this visit.    Facility-Administered Medications Ordered in Other Visits  Medication Dose Route Frequency Provider Last Rate Last Dose  . heparin lock flush 100 unit/mL  500 Units Intracatheter Once Lenetta Piche, Virgie Dad, MD      . sodium chloride flush (NS) 0.9 % injection 10 mL   10 mL Intracatheter Once Santiago Stenzel, Virgie Dad, MD        OBJECTIVE:Middle-aged white woman in no acute distress  Vitals:   09/01/17 1221  BP: (!) 112/58  Pulse: (!) 50  Resp: (!) 24  Temp: 98.3 F (36.8 C)  SpO2: 99%     Body mass index is 32.83 kg/m.    ECOG FS:1 - Symptomatic but  completely ambulatory  Sclerae unicteric, pupils round and equal Oropharynx clear and moist No cervical or supraclavicular adenopathy Lungs no rales or rhonchi Heart regular rate and rhythm Abd soft, nontender, positive bowel sounds MSK no focal spinal tenderness, no upper extremity lymphedema Neuro: nonfocal, well oriented, appropriate affect Breasts: The right breast is benign.  The left breast is status post recent lumpectomy.  The incision is healing nicely.  There is a little bit of swelling, no erythema.  The left axilla is benign    LAB RESULTS:  CMP     Component Value Date/Time   NA 140 09/01/2017 1141   K 4.4 09/01/2017 1141   CL 108 08/13/2017 0913   CO2 24 09/01/2017 1141   GLUCOSE 90 09/01/2017 1141   BUN 18.1 09/01/2017 1141   CREATININE 0.7 09/01/2017 1141   CALCIUM 9.2 09/01/2017 1141   PROT 6.7 09/01/2017 1141   ALBUMIN 3.5 09/01/2017 1141   AST 22 09/01/2017 1141   ALT 19 09/01/2017 1141   ALKPHOS 70 09/01/2017 1141   BILITOT <0.22 09/01/2017 1141   GFRNONAA >60 08/13/2017 0913   GFRAA >60 08/13/2017 0913    No results found for: TOTALPROTELP, ALBUMINELP, A1GS, A2GS, BETS, BETA2SER, GAMS, MSPIKE, SPEI  No results found for: Nils Pyle, [facility]  Lab Results  Component Value Date   WBC 7.6 09/01/2017   NEUTROABS 3.0 09/01/2017   HGB 10.7 (L) 09/01/2017   HCT 33.4 (L) 09/01/2017   MCV 97.1 09/01/2017   PLT 365 09/01/2017      Chemistry      Component Value Date/Time   NA 140 09/01/2017 1141   K 4.4 09/01/2017 1141   CL 108 08/13/2017 0913   CO2 24 09/01/2017 1141   BUN 18.1 09/01/2017 1141   CREATININE 0.7 09/01/2017 1141       Component Value Date/Time   CALCIUM 9.2 09/01/2017 1141   ALKPHOS 70 09/01/2017 1141   AST 22 09/01/2017 1141   ALT 19 09/01/2017 1141   BILITOT <0.22 09/01/2017 1141       No results found for: LABCA2  No components found for: [id number]  No results for input(s): INR in the last 168 hours.  Urinalysis    Component Value Date/Time   COLORURINE YELLOW 02/23/2014 1004   APPEARANCEUR CLEAR 02/23/2014 1004   LABSPEC 1.025 02/23/2014 1004   PHURINE 5.5 02/23/2014 1004   GLUCOSEU NEGATIVE 02/23/2014 1004   HGBUR NEGATIVE 02/23/2014 1004   BILIRUBINUR NEGATIVE 02/23/2014 1004   KETONESUR NEGATIVE 02/23/2014 1004   PROTEINUR NEGATIVE 02/23/2014 1004   UROBILINOGEN 0.2 02/23/2014 1004   NITRITE NEGATIVE 02/23/2014 1004   LEUKOCYTESUR NEGATIVE 02/23/2014 1004     STUDIES: Dg Cholangiogram Operative  Result Date: 08/19/2017 CLINICAL DATA:  51 year old female undergoing elective cholecystectomy EXAM: INTRAOPERATIVE CHOLANGIOGRAM TECHNIQUE: Cholangiographic images from the C-arm fluoroscopic device were submitted for interpretation post-operatively. Please see the procedural report for the amount of contrast and the fluoroscopy time utilized. COMPARISON:  CT scan of the abdomen and pelvis 06/26/2017; right upper quadrant abdominal ultrasound 06/12/2017 FINDINGS: A cine clip and intraoperative saved image are submitted for review. The images were obtained during intraoperative cholangiogram at the time of laparoscopic cholecystectomy. The images demonstrate cannulation of the cystic duct remanent and opacification of the biliary tree. No evidence of biliary ductal dilatation, stenosis, stricture or choledocholithiasis. Biliary anatomy is normal. Contrast material passes freely through the ampulla and into the duodenum. IMPRESSION: Negative intraoperative cholangiogram. Electronically Signed   By: Jacqulynn Cadet  M.D.   On: 08/19/2017 15:55   Nm Sentinel Node Inj-no Rpt (breast)  Result  Date: 08/19/2017 Sulfur colloid was injected by the nuclear medicine technologist for melanoma sentinel node.   Mm Breast Surgical Specimen  Result Date: 08/19/2017 CLINICAL DATA:  Biopsy-proven grade 3 invasive ductal carcinoma and DCIS with calcifications involving left breast. Patient underwent radioactive seed localization of a group of calcifications yesterday, with seed placement at the 2 biopsy sites scratch, seed placement at the posterior margin of the calcifications (not associated with a clip), and seed placement in the left axilla. EXAM: SPECIMEN RADIOGRAPH OF THE LEFT AXILLA COMPARISON:  Previous exam(s). FINDINGS: Status post excision of the left axilla. The intact radioactive seed and the adjacent lymph node are present in the specimen. This was discussed directly with the operating room nurse by telephone at the time of interpretation. IMPRESSION: Specimen radiograph of the left axilla. Electronically Signed   By: Evangeline Dakin M.D.   On: 08/19/2017 14:25   Mm Breast Surgical Specimen  Result Date: 08/19/2017 CLINICAL DATA:  Biopsy-proven grade 3 invasive ductal carcinoma and DCIS with calcifications involving left breast. Patient underwent radioactive seed localization of a group of calcifications yesterday, with seed placement at the 2 biopsy sites scratch, seed placement at the posterior margin of the calcifications (not associated with a clip), and seed placement in the left axilla. EXAM: SPECIMEN RADIOGRAPH OF THE LEFT BREAST COMPARISON:  Previous exam(s). FINDINGS: Status post excision of the left breast. The radioactive seed is present within the specimen and is completely intact. There is no tissue marker clip within the specimen. Results were discussed by telephone with the operating room nurse at the time of interpretation. IMPRESSION: Specimen radiograph of the left breast demonstrating an intact radioactive seed and no tissue marker clip. Electronically Signed   By: Evangeline Dakin M.D.   On: 08/19/2017 14:25   Mm Breast Surgical Specimen  Result Date: 08/19/2017 CLINICAL DATA:  51 year old female with history of left breast cancer post neoadjuvant chemotherapy. Radioactive seeds were placed at 3 sites in the left breast as well as left axilla. This specimen is for 2 of the radioactive seeds placed in the left breast. EXAM: SPECIMEN RADIOGRAPH OF THE LEFT BREAST COMPARISON:  Previous exam(s). FINDINGS: Status post excision of the left breast. Two radioactive seeds as well as two biopsy marking clips are present in the specimen radiograph and marked for pathology. IMPRESSION: Specimen radiograph of the left breast. Electronically Signed   By: Everlean Alstrom M.D.   On: 08/19/2017 13:50   Mm Lt Radioactive Seed Loc Mammo Guide  Result Date: 08/18/2017 CLINICAL DATA:  51 year old female presenting for radioactive seed localization of the left breast prior to lumpectomy. EXAM: MAMMOGRAPHIC GUIDED RADIOACTIVE SEED LOCALIZATION OF THE LEFT BREAST COMPARISON:  Previous exam(s). FINDINGS: Patient presents for radioactive seed localization prior to left breast lumpectomy. I met with the patient and we discussed the procedure of seed localization including benefits and alternatives. We discussed the high likelihood of a successful procedure. We discussed the risks of the procedure including infection, bleeding, tissue injury and further surgery. We discussed the low dose of radioactivity involved in the procedure. Informed, written consent was given. The usual time-out protocol was performed immediately prior to the procedure. Magnification images were obtained prior to the seed placement to determine the span of the calcifications remaining in the left breast. On these magnification images, there is at least 5 cm of calcifications primarily extending posterior to the 2  biopsy marking clips and approximately 5.4 cm of calcifications extending deep to the biopsy marking clips  (perpendicular to the skin). Prior to starting the localization, I spoke with Dr. Excell Seltzer by phone to discuss plan for the seed placement. We agreed upon a plan to place radioactive seeds at each of the biopsy marking clips, and a third seed farther posterior and deeper within the breast to try to demarcate the region of calcifications. Using mammographic guidance, sterile technique, 1% lidocaine and an I-125 radioactive seed, the ribbon shaped biopsy marking clip in the upper-outer quadrant of the left breast was localized using a superior approach. Follow-up survey of the patient confirms presence of the radioactive seed. Order number of I-125 seed:  [id number]. Total activity:  2.924 millicuries  Reference Date: 07/30/2017 ------------------------------------------------ Using mammographic guidance, sterile technique, 1% lidocaine and an I-125 radioactive seed, the X shaped biopsy marking clip in the upper-outer quadrant of the left breast was localized using a superior approach. Follow-up survey of the patient confirms presence of the radioactive seed. Order number of I-125 seed:  [id number]. Total activity:  7.116 millicuries  Reference Date: 07/30/2017 ------------------------------------------------ Using mammographic guidance, sterile technique, 1% lidocaine and an I-125 radioactive seed, the posterior an inferior aspect of the residual calcifications was localized using a superior approach. Follow-up survey of the patient confirms presence of the radioactive seed. Order number of I-125 seed:  [id number]. Total activity:  8.329 millicuries  Reference Date: 07/30/2017 The follow-up mammogram images confirm the seeds to be in the expected locations and were marked for Dr. Excell Seltzer. The patient tolerated the procedure well and was released from the [facility]. She was given instructions regarding seed removal. IMPRESSION: Radioactive seed localization of 3 sites in the left breast. No apparent complications.  Electronically Signed   By: Ammie Ferrier M.D.   On: 08/18/2017 16:02   Mm Lt Rad Seed Ea Add Lesion Loc Mammo  Result Date: 08/18/2017 CLINICAL DATA:  51 year old female presenting for radioactive seed localization of the left breast prior to lumpectomy. EXAM: MAMMOGRAPHIC GUIDED RADIOACTIVE SEED LOCALIZATION OF THE LEFT BREAST COMPARISON:  Previous exam(s). FINDINGS: Patient presents for radioactive seed localization prior to left breast lumpectomy. I met with the patient and we discussed the procedure of seed localization including benefits and alternatives. We discussed the high likelihood of a successful procedure. We discussed the risks of the procedure including infection, bleeding, tissue injury and further surgery. We discussed the low dose of radioactivity involved in the procedure. Informed, written consent was given. The usual time-out protocol was performed immediately prior to the procedure. Magnification images were obtained prior to the seed placement to determine the span of the calcifications remaining in the left breast. On these magnification images, there is at least 5 cm of calcifications primarily extending posterior to the 2 biopsy marking clips and approximately 5.4 cm of calcifications extending deep to the biopsy marking clips (perpendicular to the skin). Prior to starting the localization, I spoke with Dr. Excell Seltzer by phone to discuss plan for the seed placement. We agreed upon a plan to place radioactive seeds at each of the biopsy marking clips, and a third seed farther posterior and deeper within the breast to try to demarcate the region of calcifications. Using mammographic guidance, sterile technique, 1% lidocaine and an I-125 radioactive seed, the ribbon shaped biopsy marking clip in the upper-outer quadrant of the left breast was localized using a superior approach. Follow-up survey of the patient confirms presence of the radioactive seed. Order  number of I-125 seed:   [id number]. Total activity:  6.967 millicuries  Reference Date: 07/30/2017 ------------------------------------------------ Using mammographic guidance, sterile technique, 1% lidocaine and an I-125 radioactive seed, the X shaped biopsy marking clip in the upper-outer quadrant of the left breast was localized using a superior approach. Follow-up survey of the patient confirms presence of the radioactive seed. Order number of I-125 seed:  [id number]. Total activity:  1.025 millicuries  Reference Date: 07/30/2017 ------------------------------------------------ Using mammographic guidance, sterile technique, 1% lidocaine and an I-125 radioactive seed, the posterior an inferior aspect of the residual calcifications was localized using a superior approach. Follow-up survey of the patient confirms presence of the radioactive seed. Order number of I-125 seed:  [id number]. Total activity:  3.536 millicuries  Reference Date: 07/30/2017 The follow-up mammogram images confirm the seeds to be in the expected locations and were marked for Dr. Excell Seltzer. The patient tolerated the procedure well and was released from the Breast Center. She was given instructions regarding seed removal. IMPRESSION: Radioactive seed localization of 3 sites in the left breast. No apparent complications. Electronically Signed   By: Ammie Ferrier M.D.   On: 08/18/2017 16:02   Mm Lt Rad Seed Ea Add Lesion Loc Mammo  Result Date: 08/18/2017 CLINICAL DATA:  51 year old female presenting for radioactive seed localization of the left breast prior to lumpectomy. EXAM: MAMMOGRAPHIC GUIDED RADIOACTIVE SEED LOCALIZATION OF THE LEFT BREAST COMPARISON:  Previous exam(s). FINDINGS: Patient presents for radioactive seed localization prior to left breast lumpectomy. I met with the patient and we discussed the procedure of seed localization including benefits and alternatives. We discussed the high likelihood of a successful procedure. We discussed the risks of  the procedure including infection, bleeding, tissue injury and further surgery. We discussed the low dose of radioactivity involved in the procedure. Informed, written consent was given. The usual time-out protocol was performed immediately prior to the procedure. Magnification images were obtained prior to the seed placement to determine the span of the calcifications remaining in the left breast. On these magnification images, there is at least 5 cm of calcifications primarily extending posterior to the 2 biopsy marking clips and approximately 5.4 cm of calcifications extending deep to the biopsy marking clips (perpendicular to the skin). Prior to starting the localization, I spoke with Dr. Excell Seltzer by phone to discuss plan for the seed placement. We agreed upon a plan to place radioactive seeds at each of the biopsy marking clips, and a third seed farther posterior and deeper within the breast to try to demarcate the region of calcifications. Using mammographic guidance, sterile technique, 1% lidocaine and an I-125 radioactive seed, the ribbon shaped biopsy marking clip in the upper-outer quadrant of the left breast was localized using a superior approach. Follow-up survey of the patient confirms presence of the radioactive seed. Order number of I-125 seed:  [id number]. Total activity:  8.676 millicuries  Reference Date: 07/30/2017 ------------------------------------------------ Using mammographic guidance, sterile technique, 1% lidocaine and an I-125 radioactive seed, the X shaped biopsy marking clip in the upper-outer quadrant of the left breast was localized using a superior approach. Follow-up survey of the patient confirms presence of the radioactive seed. Order number of I-125 seed:  [id number]. Total activity:  1.245 millicuries  Reference Date: 07/30/2017 ------------------------------------------------ Using mammographic guidance, sterile technique, 1% lidocaine and an I-125 radioactive seed, the  posterior an inferior aspect of the residual calcifications was localized using a superior approach. Follow-up survey of the patient confirms presence of the radioactive seed. Order number of  I-125 seed:  [id number]. Total activity:  7.169 millicuries  Reference Date: 07/30/2017 The follow-up mammogram images confirm the seeds to be in the expected locations and were marked for Dr. Excell Seltzer. The patient tolerated the procedure well and was [facility]. She was given instructions regarding seed removal. IMPRESSION: Radioactive seed localization of 3 sites in the left breast. No apparent complications. Electronically Signed   By: Ammie Ferrier M.D.   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 EXAM: ULTRASOUND GUIDED RADIOACTIVE SEED LOCALIZATION OF THE LEFT AXILLA COMPARISON:  Previous exam(s). FINDINGS: Patient presents for radioactive seed localization prior to left breast lumpectomy and left axillary lymph node dissection. I met with the patient and we discussed the procedure of seed localization including benefits and alternatives. We discussed the high likelihood of a successful procedure. We discussed the risks of the procedure including infection, bleeding, tissue injury and further surgery. We discussed the low dose of radioactivity involved in the procedure. Informed, written consent was given. The usual time-out protocol was performed immediately prior to the procedure. Using ultrasound guidance, sterile technique, 1% lidocaine and an I-125 radioactive seed, the lymph node and associated biopsy marking clip was was localized using a inferior to superior approach. A follow-up mammogram was not performed as the spiral shaped biopsy marking clip in  the left axillary lymph node was not visualized on the post biopsy mammograms due to the deep location. Follow-up survey of the patient confirms presence of the radioactive seed. Order number of I-125 seed:  [id number]. Total activity: 7.510 millicuries Reference Date: 07/30/2017 The patient tolerated the procedure well and was released from the [facility]. She was given instructions regarding seed removal. IMPRESSION: Radioactive seed localization left axilla. The radioactive seed was placed adjacent to the previously biopsied lymph node in the left axilla. Electronically Signed   By: Everlean Alstrom M.D.   On: 08/19/2017 11:07    ELIGIBLE FOR AVAILABLE RESEARCH PROTOCOL: no  ASSESSMENT: 51 y.o. [place], [place] woman status post left breast overlapping sites biopsy 03/02/2017 for a clinical T3 N1-2, stage IIIA invasive ductal carcinoma, grade 3, estrogen and progesterone receptor negative, HER-2 amplified, with an MIB-1 of 35%  (1) genetics testing07/24/2018 through the Hereditary Gene Panel offered by [facility] found no deleterious mutations in APC, ATM, AXIN2, BARD1, BMPR1A, BRCA1, BRCA2, BRIP1, CDH1, CDKN2A (p14ARF), CDKN2A (p16INK4a), CHEK2, CTNNA1, DICER1, EPCAM (Deletion/duplication testing only), GREM1 (promoter region deletion/duplication testing only), KIT, MEN1, MLH1, MSH2, MSH3, MSH6, MUTYH, NBN, NF1, NHTL1, PALB2, PDGFRA, PMS2, POLD1, POLE, PTEN, RAD50, RAD51C, RAD51D, SDHB, SDHC, SDHD, SMAD4, SMARCA4. STK11, TP53, TSC1, TSC2, and VHL.  The following genes were evaluated for sequence changes only: SDHA and HOXB13 c.251G>A variant only.  (2) neoadjuvant chemotherapy consisting of carboplatin and Docetaxel given with trastuzumab and Pertuzumab every 21 days for 6 cycles, started 03/31/2017, completed 07/14/2017  (a) Pertuzumab stopped after cycle 1 secondary to side effects     (b) Pertuzumab resumed cycle 3, not at loading dose.  (3) trastuzumab continued to complete 6 months (final dose  09/22/2017)  (a) echocardiogram 03/20/2017 showed an ejection fraction of 55%  (b) echocardiogram 06/30/2017 shows an ejection fraction of 55%   (4) status post left lumpectomy and axillary lymph node dissection 08/19/2017 for a residual pT1a pN0  invasive ductal carcinoma, grade 2, with negative margins, repeat prognostic panel again estrogen and progesterone receptor negative, but HER-2 amplified  (5) adjuvant radiation to follow    PLAN: Mariyam did well with her surgery and is now ready to start her radiation treatments.  I expect that they will go from somewhere mid December through January.  She will have a Herceptin treatment today and then 3 weeks from today.  That will be her last Herceptin treatment.  After that she can have the port removed any time that is convenient  Today we talked about her prognosis which is very good.  We also discussed the finding you are near normal which I strongly encouraged her to participate in.  She is going to return to see me in March, but of course I will be glad to see her before then as needed arises      Chauncey Cruel, MD 09/01/17 12:53 PM Medical Oncology and Hematology [facility] [place], [place] [address] Tel. [phone] Fax. [phone]  This document serves as a record of services personally performed by Lurline Del, MD. It was created on his behalf by Sheron Nightingale, a trained medical scribe. The creation of this record is based on the scribe's personal observations and the provider's statements to them.   I have reviewed the above documentation for accuracy and completeness, and I agree with the above.

## 2017-09-01 ENCOUNTER — Encounter: Payer: Self-pay | Admitting: Radiation Oncology

## 2017-09-01 ENCOUNTER — Ambulatory Visit
Admission: RE | Admit: 2017-09-01 | Discharge: 2017-09-01 | Disposition: A | Discharge status: Home / Self Care | Payer: 59 | Source: Ambulatory Visit | Attending: Radiation Oncology | Admitting: Radiation Oncology

## 2017-09-01 ENCOUNTER — Other Ambulatory Visit (HOSPITAL_BASED_OUTPATIENT_CLINIC_OR_DEPARTMENT_OTHER): Payer: 59

## 2017-09-01 ENCOUNTER — Ambulatory Visit (HOSPITAL_BASED_OUTPATIENT_CLINIC_OR_DEPARTMENT_OTHER): Payer: 59 | Admitting: Oncology

## 2017-09-01 ENCOUNTER — Ambulatory Visit (HOSPITAL_BASED_OUTPATIENT_CLINIC_OR_DEPARTMENT_OTHER): Payer: 59

## 2017-09-01 VITALS — BP 112/58 | HR 50 | Temp 98.3°F | Resp 24 | Ht 65.0 in | Wt 197.3 lb

## 2017-09-01 DIAGNOSIS — Z803 Family history of malignant neoplasm of breast: Secondary | ICD-10-CM | POA: Diagnosis not present

## 2017-09-01 DIAGNOSIS — Z79899 Other long term (current) drug therapy: Secondary | ICD-10-CM | POA: Diagnosis not present

## 2017-09-01 DIAGNOSIS — Z5112 Encounter for antineoplastic immunotherapy: Secondary | ICD-10-CM

## 2017-09-01 DIAGNOSIS — Z9889 Other specified postprocedural states: Secondary | ICD-10-CM | POA: Diagnosis not present

## 2017-09-01 DIAGNOSIS — N6489 Other specified disorders of breast: Secondary | ICD-10-CM | POA: Diagnosis not present

## 2017-09-01 DIAGNOSIS — Z171 Estrogen receptor negative status [ER-]: Secondary | ICD-10-CM

## 2017-09-01 DIAGNOSIS — Z9049 Acquired absence of other specified parts of digestive tract: Secondary | ICD-10-CM | POA: Diagnosis not present

## 2017-09-01 DIAGNOSIS — C50112 Malignant neoplasm of central portion of left female breast: Secondary | ICD-10-CM | POA: Insufficient documentation

## 2017-09-01 DIAGNOSIS — C773 Secondary and unspecified malignant neoplasm of axilla and upper limb lymph nodes: Secondary | ICD-10-CM | POA: Diagnosis not present

## 2017-09-01 DIAGNOSIS — Z9221 Personal history of antineoplastic chemotherapy: Secondary | ICD-10-CM | POA: Insufficient documentation

## 2017-09-01 DIAGNOSIS — R5383 Other fatigue: Secondary | ICD-10-CM | POA: Insufficient documentation

## 2017-09-01 DIAGNOSIS — C50812 Malignant neoplasm of overlapping sites of left female breast: Secondary | ICD-10-CM

## 2017-09-01 DIAGNOSIS — Z79891 Long term (current) use of opiate analgesic: Secondary | ICD-10-CM | POA: Insufficient documentation

## 2017-09-01 LAB — COMPREHENSIVE METABOLIC PANEL
ALBUMIN: 3.5 g/dL (ref 3.5–5.0)
ALK PHOS: 70 U/L (ref 40–150)
ALT: 19 U/L (ref 0–55)
AST: 22 U/L (ref 5–34)
Anion Gap: 9 mEq/L (ref 3–11)
BUN: 18.1 mg/dL (ref 7.0–26.0)
CHLORIDE: 108 meq/L (ref 98–109)
CO2: 24 mEq/L (ref 22–29)
Calcium: 9.2 mg/dL (ref 8.4–10.4)
Creatinine: 0.7 mg/dL (ref 0.6–1.1)
GLUCOSE: 90 mg/dL (ref 70–140)
POTASSIUM: 4.4 meq/L (ref 3.5–5.1)
SODIUM: 140 meq/L (ref 136–145)
Total Bilirubin: <0.22 mg/dL (ref 0.20–1.20)
Total Protein: 6.7 g/dL (ref 6.4–8.3)

## 2017-09-01 LAB — CBC WITH DIFFERENTIAL/PLATELET
BASO%: 1.1 % (ref 0.0–2.0)
Basophils Absolute: 0.1 10*3/uL (ref 0.0–0.1)
EOS%: 9.4 % — ABNORMAL HIGH (ref 0.0–7.0)
Eosinophils Absolute: 0.7 10*3/uL — ABNORMAL HIGH (ref 0.0–0.5)
HCT: 33.4 % — ABNORMAL LOW (ref 34.8–46.6)
HGB: 10.7 g/dL — ABNORMAL LOW (ref 11.6–15.9)
LYMPH%: 44.3 % (ref 14.0–49.7)
MCH: 31.1 pg (ref 25.1–34.0)
MCHC: 32 g/dL (ref 31.5–36.0)
MCV: 97.1 fL (ref 79.5–101.0)
MONO#: 0.4 10*3/uL (ref 0.1–0.9)
MONO%: 5.4 % (ref 0.0–14.0)
NEUT%: 39.8 % (ref 38.4–76.8)
NEUTROS ABS: 3 10*3/uL (ref 1.5–6.5)
Platelets: 365 10*3/uL (ref 145–400)
RBC: 3.44 10*6/uL — AB (ref 3.70–5.45)
RDW: 14.3 % (ref 11.2–14.5)
WBC: 7.6 10*3/uL (ref 3.9–10.3)
lymph#: 3.4 10*3/uL — ABNORMAL HIGH (ref 0.9–3.3)

## 2017-09-01 MED ORDER — DIPHENHYDRAMINE HCL 25 MG PO CAPS
ORAL_CAPSULE | ORAL | Status: AC
  Filled 2017-09-01: qty 1

## 2017-09-01 MED ORDER — ACETAMINOPHEN 325 MG PO TABS
650.0000 mg | ORAL_TABLET | Freq: Once | ORAL | Status: AC
  Administered 2017-09-01: 650 mg via ORAL

## 2017-09-01 MED ORDER — SODIUM CHLORIDE 0.9% FLUSH
10.0000 mL | INTRAVENOUS | Status: DC | PRN
  Administered 2017-09-01: 10 mL
  Filled 2017-09-01: qty 10

## 2017-09-01 MED ORDER — ACETAMINOPHEN 325 MG PO TABS
ORAL_TABLET | ORAL | Status: AC
  Filled 2017-09-01: qty 2

## 2017-09-01 MED ORDER — DIPHENHYDRAMINE HCL 25 MG PO CAPS
25.0000 mg | ORAL_CAPSULE | Freq: Once | ORAL | Status: AC
  Administered 2017-09-01: 25 mg via ORAL

## 2017-09-01 MED ORDER — SODIUM CHLORIDE 0.9 % IV SOLN
Freq: Once | INTRAVENOUS | Status: AC
  Administered 2017-09-01: 14:00:00 via INTRAVENOUS

## 2017-09-01 MED ORDER — TRASTUZUMAB CHEMO 150 MG IV SOLR
6.0000 mg/kg | Freq: Once | INTRAVENOUS | Status: AC
  Administered 2017-09-01: 525 mg via INTRAVENOUS
  Filled 2017-09-01: qty 25

## 2017-09-01 MED ORDER — HEPARIN SOD (PORK) LOCK FLUSH 100 UNIT/ML IV SOLN
500.0000 [IU] | Freq: Once | INTRAVENOUS | Status: AC | PRN
Start: 2017-09-01 — End: 2017-09-01
  Administered 2017-09-01: 500 [IU]
  Filled 2017-09-01: qty 5

## 2017-09-01 NOTE — Patient Instructions (Signed)
[facility] Discharge Instructions for Patients Receiving Chemotherapy Today you received the following chemotherapy agents:  Herceptin To help prevent nausea and vomiting after your treatment, we encourage you to take your nausea medication as prescribed.   If you develop nausea and vomiting that is not controlled by your nausea medication, call the clinic.   BELOW ARE SYMPTOMS THAT SHOULD BE REPORTED IMMEDIATELY:  *FEVER GREATER THAN 100.5 F  *CHILLS WITH OR WITHOUT FEVER  NAUSEA AND VOMITING THAT IS NOT CONTROLLED WITH YOUR NAUSEA MEDICATION  *UNUSUAL SHORTNESS OF BREATH  *UNUSUAL BRUISING OR BLEEDING  TENDERNESS IN MOUTH AND THROAT WITH OR WITHOUT PRESENCE OF ULCERS  *URINARY PROBLEMS  *BOWEL PROBLEMS  UNUSUAL RASH Items with * indicate a potential emergency and should be followed up as soon as possible.  Feel free to call the clinic should you have any questions or concerns. The clinic phone number is [phone].  Please show the CHEMO ALERT CARD at check-in to the Emergency Department and triage nurse.   

## 2017-09-01 NOTE — Progress Notes (Signed)
Radiation Oncology         [phone] ________________________________  Name: Maria Hester MRN: [id number]  Date: 09/01/2017  DOB: 1966/03/07  Follow-Up Visit Note  Outpatient  CC: [name], MD  [name], MD  Diagnosis:      ICD-10-CM   1. Malignant neoplasm of central portion of left breast in female, estrogen receptor negative ([place]) C50.112    Z17.1     Stage IIIA, Pathologic Stage ypT1a ypN0 Left Breast  Clinical T3N1M0 UOQ Invasive Ductal Carcinoma, ER- / PR- / Her2+, Grade 3  CHIEF COMPLAINT: Here to discuss management of left breast cancer  Narrative:  The patient returns today for follow-up.     Since consultation, she underwent bilateral breast MRI on 03/17/2017 that showed asymmetric spiculated enhancement in the superior aspect of the left breast measuring 5.1 x 2.5 x 3.0 cm, corresponding to the known areas of malignancy. Other enhancing nodules were seen in the left breast as well as abnormal left axillary adenopathy. CT Chest on 03/17/2017 showed adenopathy most notably in the sub-carinal region - resolved by CT in Sept. Bone scan on 03/17/2017 demonstrated no evidence of metastatic disease.  She had genetic testing done on 04/28/2017 due to a personal and family history of breast cancer. Results came back negative.  She has had neoadjuvant chemotherapy consisting of carboplatin and Docetaxal given with trastuzamab and Pertuzumab every 21 days for 6 cycles, starting 03/31/2017. Pertuzumab was stopped after cycle 1 secondary to side effects and was resumed at cycle 3 not at loading dose. She has since completed her chemotherapy, and MRI performed on 07/15/2017 showed a complete response. Her next appointment with Dr. Jana Hakim is today where she will receive her next Herceptin infusion.  She underwent left breast lumpectomy with lymph node biopsy on 08/19/2017. 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  Margins and 9 left axillary lymph nodes were negative. A laparoscopic cholecystectomy was also performed at that time due to persistent right upper quadrant abdominal pain. Gallbladder ultrasound was obtained and showed gallbladder sludge and a 4 mm probably polyp in the gallbladder wall. Surgical pathology revealed chronic cholecystitis.  The patient reviewed results with her surgeon and has kindly been referred today for discussion of adjuvant radiotherapy. She is accompanied by her sister. Today, she reports good arm mobility since surgery. She reports an area on her upper left arm that she says feels like a "carpet burn" and feels numb upon palpation. She says this started after her surgery. She plans to return to work part-time on December 3rd.           ALLERGIES:  has No Known Allergies.  Meds: Current Outpatient Medications  Medication Sig Dispense Refill  . acetaminophen (TYLENOL) 500 MG tablet Take 500 mg by mouth daily as needed for moderate pain or headache.    . esomeprazole (NEXIUM) 40 MG capsule Take 1 capsule (40 mg total) by mouth 2 (two) times daily before a meal. (Patient taking differently: Take 40 mg by mouth daily. ) 60 capsule 2  . rosuvastatin (CRESTOR) 10 MG tablet Take 10 mg by mouth every other day. At bedtime  0  . acyclovir (ZOVIRAX) 400 MG tablet Take 1 tablet (400 mg total) by mouth 5 (five) times daily. (Patient not taking: Reported on 09/01/2017) 105 tablet 0  . ALPRAZolam (XANAX) 0.5 MG tablet TAKE HALF TO 1 TABLET BY MOUTH DAILY AS NEEDED FOR ANXIETY  2  .  bismuth subsalicylate (PEPTO BISMOL) 262 MG/15ML suspension Take 30 mLs by mouth every 6 (six) hours as needed for indigestion.    . lidocaine-prilocaine (EMLA) cream Apply 1 application topically as needed (port access).    . ondansetron (ZOFRAN) 4 MG tablet Take 1 tablet (4 mg total) every 8 (eight) hours as needed by mouth for nausea or vomiting. (Patient not taking: Reported on 09/01/2017) 10 tablet 1  .  oxyCODONE (OXY IR/ROXICODONE) 5 MG immediate release tablet Take 1 tablet (5 mg total) every 6 (six) hours as needed by mouth for severe pain. (Patient not taking: Reported on 09/01/2017) 15 tablet 0   No current facility-administered medications for this encounter.    Facility-Administered Medications Ordered in Other Encounters  Medication Dose Route Frequency Provider Last Rate Last Dose  . heparin lock flush 100 unit/mL  500 Units Intracatheter Once Magrinat, Virgie Dad, MD      . sodium chloride flush (NS) 0.9 % injection 10 mL  10 mL Intracatheter Once Magrinat, Virgie Dad, MD      . sodium chloride flush (NS) 0.9 % injection 10 mL  10 mL Intracatheter PRN Magrinat, Virgie Dad, MD   10 mL at 09/01/17 1448   Review of Systems:as above  Physical Findings:  height is 5' 5" (1.651 m) and weight is 197 lb 6.4 oz (89.5 kg). Her temperature is 98.1 F (36.7 C). Her blood pressure is 107/48 (abnormal) and her pulse is 53 (abnormal). Her oxygen saturation is 100%.   General: Alert and oriented, in no acute distress. HEENT: Head is normocephalic. Heart: Regular in rate and rhythm with no murmurs, rubs, or gallops. Vascular: Right upper chest port-a-cath. Chest: Clear to auscultation bilaterally, with no rhonchi, wheezes, or rales. Abdomen: Soft, nontender, nondistended, with no rigidity or guarding. Extremities: No lymphedema. Musculoskeletal: Good ROM in her left shoulder. Neurologic: No obvious focalities. Speech is fluent.  Psychiatric: Judgment and insight are intact. Affect is appropriate. Breast exam reveals a central lumpectomy scar on the left that is healing well with some post-operative swelling in the upper breast. The axillary scar on the left is also healing well.  Lab Findings: Lab Results  Component Value Date   WBC 7.6 09/01/2017   HGB 10.7 (L) 09/01/2017   HCT 33.4 (L) 09/01/2017   MCV 97.1 09/01/2017   PLT 365 09/01/2017    _0 @  Radiographic Findings: Dg  Cholangiogram Operative  Result Date: 08/19/2017 CLINICAL DATA:  51 year old female undergoing elective cholecystectomy EXAM: INTRAOPERATIVE CHOLANGIOGRAM TECHNIQUE: Cholangiographic images from the C-arm fluoroscopic device were submitted for interpretation post-operatively. Please see the procedural report for the amount of contrast and the fluoroscopy time utilized. COMPARISON:  CT scan of the abdomen and pelvis 06/26/2017; right upper quadrant abdominal ultrasound 06/12/2017 FINDINGS: A cine clip and intraoperative saved image are submitted for review. The images were obtained during intraoperative cholangiogram at the time of laparoscopic cholecystectomy. The images demonstrate cannulation of the cystic duct remanent and opacification of the biliary tree. No evidence of biliary ductal dilatation, stenosis, stricture or choledocholithiasis. Biliary anatomy is normal. Contrast material passes freely through the ampulla and into the duodenum. IMPRESSION: Negative intraoperative cholangiogram. Electronically Signed   By: Jacqulynn Cadet M.D.   On: 08/19/2017 15:55   Nm Sentinel Node Inj-no Rpt (breast)  Result Date: 08/19/2017 Sulfur colloid was injected by the nuclear medicine technologist for melanoma sentinel node.   Mm Breast Surgical Specimen  Result Date: 08/19/2017 CLINICAL DATA:  Biopsy-proven grade 3 invasive ductal carcinoma and  DCIS with calcifications involving left breast. Patient underwent radioactive seed localization of a group of calcifications yesterday, with seed placement at the 2 biopsy sites scratch, seed placement at the posterior margin of the calcifications (not associated with a clip), and seed placement in the left axilla. EXAM: SPECIMEN RADIOGRAPH OF THE LEFT AXILLA COMPARISON:  Previous exam(s). FINDINGS: Status post excision of the left axilla. The intact radioactive seed and the adjacent lymph node are present in the specimen. This was discussed directly with the operating  room nurse by telephone at the time of interpretation. IMPRESSION: Specimen radiograph of the left axilla. Electronically Signed   By: Evangeline Dakin M.D.   On: 08/19/2017 14:25   Mm Breast Surgical Specimen  Result Date: 08/19/2017 CLINICAL DATA:  Biopsy-proven grade 3 invasive ductal carcinoma and DCIS with calcifications involving left breast. Patient underwent radioactive seed localization of a group of calcifications yesterday, with seed placement at the 2 biopsy sites scratch, seed placement at the posterior margin of the calcifications (not associated with a clip), and seed placement in the left axilla. EXAM: SPECIMEN RADIOGRAPH OF THE LEFT BREAST COMPARISON:  Previous exam(s). FINDINGS: Status post excision of the left breast. The radioactive seed is present within the specimen and is completely intact. There is no tissue marker clip within the specimen. Results were discussed by telephone with the operating room nurse at the time of interpretation. IMPRESSION: Specimen radiograph of the left breast demonstrating an intact radioactive seed and no tissue marker clip. Electronically Signed   By: Evangeline Dakin M.D.   On: 08/19/2017 14:25   Mm Breast Surgical Specimen  Result Date: 08/19/2017 CLINICAL DATA:  51 year old female with history of left breast cancer post neoadjuvant chemotherapy. Radioactive seeds were placed at 3 sites in the left breast as well as left axilla. This specimen is for 2 of the radioactive seeds placed in the left breast. EXAM: SPECIMEN RADIOGRAPH OF THE LEFT BREAST COMPARISON:  Previous exam(s). FINDINGS: Status post excision of the left breast. Two radioactive seeds as well as two biopsy marking clips are present in the specimen radiograph and marked for pathology. IMPRESSION: Specimen radiograph of the left breast. Electronically Signed   By: Everlean Alstrom M.D.   On: 08/19/2017 13:50   Mm Lt Radioactive Seed Loc Mammo Guide  Result Date: 08/18/2017 CLINICAL DATA:   51 year old female presenting for radioactive seed localization of the left breast prior to lumpectomy. EXAM: MAMMOGRAPHIC GUIDED RADIOACTIVE SEED LOCALIZATION OF THE LEFT BREAST COMPARISON:  Previous exam(s). FINDINGS: Patient presents for radioactive seed localization prior to left breast lumpectomy. I met with the patient and we discussed the procedure of seed localization including benefits and alternatives. We discussed the high likelihood of a successful procedure. We discussed the risks of the procedure including infection, bleeding, tissue injury and further surgery. We discussed the low dose of radioactivity involved in the procedure. Informed, written consent was given. The usual time-out protocol was performed immediately prior to the procedure. Magnification images were obtained prior to the seed placement to determine the span of the calcifications remaining in the left breast. On these magnification images, there is at least 5 cm of calcifications primarily extending posterior to the 2 biopsy marking clips and approximately 5.4 cm of calcifications extending deep to the biopsy marking clips (perpendicular to the skin). Prior to starting the localization, I spoke with Dr. Excell Seltzer by phone to discuss plan for the seed placement. We agreed upon a plan to place radioactive seeds at each of  the biopsy marking clips, and a third seed farther posterior and deeper within the breast to try to demarcate the region of calcifications. Using mammographic guidance, sterile technique, 1% lidocaine and an I-125 radioactive seed, the ribbon shaped biopsy marking clip in the upper-outer quadrant of the left breast was localized using a superior approach. Follow-up survey of the patient confirms presence of the radioactive seed. Order number of I-125 seed:  [id number]. Total activity:  2.202 millicuries  Reference Date: 07/30/2017 ------------------------------------------------ Using mammographic guidance, sterile  technique, 1% lidocaine and an I-125 radioactive seed, the X shaped biopsy marking clip in the upper-outer quadrant of the left breast was localized using a superior approach. Follow-up survey of the patient confirms presence of the radioactive seed. Order number of I-125 seed:  [id number]. Total activity:  6.283 millicuries  Reference Date: 07/30/2017 ------------------------------------------------ Using mammographic guidance, sterile technique, 1% lidocaine and an I-125 radioactive seed, the posterior an inferior aspect of the residual calcifications was localized using a superior approach. Follow-up survey of the patient confirms presence of the radioactive seed. Order number of I-125 seed:  [id number]. Total activity:  3.710 millicuries  Reference Date: 07/30/2017 The follow-up mammogram images confirm the seeds to be in the expected locations and were marked for Dr. Excell Seltzer. The patient tolerated the procedure well and was [facility] from the [facility]. She was given instructions regarding seed removal. IMPRESSION: Radioactive seed localization of 3 sites in the left breast. No apparent complications. Electronically Signed   By: Ammie Ferrier M.D.   On: 08/18/2017 16:02   Mm Lt Rad Seed Ea Add Lesion Loc Mammo  Result Date: 08/18/2017 CLINICAL DATA:  51 year old female presenting for radioactive seed localization of the left breast prior to lumpectomy. EXAM: MAMMOGRAPHIC GUIDED RADIOACTIVE SEED LOCALIZATION OF THE LEFT BREAST COMPARISON:  Previous exam(s). FINDINGS: Patient presents for radioactive seed localization prior to left breast lumpectomy. I met with the patient and we discussed the procedure of seed localization including benefits and alternatives. We discussed the high likelihood of a successful procedure. We discussed the risks of the procedure including infection, bleeding, tissue injury and further surgery. We discussed the low dose of radioactivity involved in the procedure. Informed,  written consent was given. The usual time-out protocol was performed immediately prior to the procedure. Magnification images were obtained prior to the seed placement to determine the span of the calcifications remaining in the left breast. On these magnification images, there is at least 5 cm of calcifications primarily extending posterior to the 2 biopsy marking clips and approximately 5.4 cm of calcifications extending deep to the biopsy marking clips (perpendicular to the skin). Prior to starting the localization, I spoke with Dr. Excell Seltzer by phone to discuss plan for the seed placement. We agreed upon a plan to place radioactive seeds at each of the biopsy marking clips, and a third seed farther posterior and deeper within the breast to try to demarcate the region of calcifications. Using mammographic guidance, sterile technique, 1% lidocaine and an I-125 radioactive seed, the ribbon shaped biopsy marking clip in the upper-outer quadrant of the left breast was localized using a superior approach. Follow-up survey of the patient confirms presence of the radioactive seed. Order number of I-125 seed:  [id number]. Total activity:  2.703 millicuries  Reference Date: 07/30/2017 ------------------------------------------------ Using mammographic guidance, sterile technique, 1% lidocaine and an I-125 radioactive seed, the X shaped biopsy marking clip in the upper-outer quadrant of the left breast was localized using a superior approach. Follow-up survey of  the patient confirms presence of the radioactive seed. Order number of I-125 seed:  [id number]. Total activity:  2.035 millicuries  Reference Date: 07/30/2017 ------------------------------------------------ Using mammographic guidance, sterile technique, 1% lidocaine and an I-125 radioactive seed, the posterior an inferior aspect of the residual calcifications was localized using a superior approach. Follow-up survey of the patient confirms presence of the  radioactive seed. Order number of I-125 seed:  [id number]. Total activity:  5.364 millicuries  Reference Date: 07/30/2017 The follow-up mammogram images confirm the seeds to be in the expected locations and were marked for Dr. Excell Seltzer. The patient tolerated the procedure well and was released from the [facility]. She was given instructions regarding seed removal. IMPRESSION: Radioactive seed localization of 3 sites in the left breast. No apparent complications. Electronically Signed   By: Ammie Ferrier M.D.   On: 08/18/2017 16:02   Mm Lt Rad Seed Ea Add Lesion Loc Mammo  Result Date: 08/18/2017 CLINICAL DATA:  51 year old female presenting for radioactive seed localization of the left breast prior to lumpectomy. EXAM: MAMMOGRAPHIC GUIDED RADIOACTIVE SEED LOCALIZATION OF THE LEFT BREAST COMPARISON:  Previous exam(s). FINDINGS: Patient presents for radioactive seed localization prior to left breast lumpectomy. I met with the patient and we discussed the procedure of seed localization including benefits and alternatives. We discussed the high likelihood of a successful procedure. We discussed the risks of the procedure including infection, bleeding, tissue injury and further surgery. We discussed the low dose of radioactivity involved in the procedure. Informed, written consent was given. The usual time-out protocol was performed immediately prior to the procedure. Magnification images were obtained prior to the seed placement to determine the span of the calcifications remaining in the left breast. On these magnification images, there is at least 5 cm of calcifications primarily extending posterior to the 2 biopsy marking clips and approximately 5.4 cm of calcifications extending deep to the biopsy marking clips (perpendicular to the skin). Prior to starting the localization, I spoke with Dr. Excell Seltzer by phone to discuss plan for the seed placement. We agreed upon a plan to place radioactive seeds at each  of the biopsy marking clips, and a third seed farther posterior and deeper within the breast to try to demarcate the region of calcifications. Using mammographic guidance, sterile technique, 1% lidocaine and an I-125 radioactive seed, the ribbon shaped biopsy marking clip in the upper-outer quadrant of the left breast was localized using a superior approach. Follow-up survey of the patient confirms presence of the radioactive seed. Order number of I-125 seed:  [id number]. Total activity:  8.250 millicuries  Reference Date: 07/30/2017 ------------------------------------------------ Using mammographic guidance, sterile technique, 1% lidocaine and an I-125 radioactive seed, the X shaped biopsy marking clip in the upper-outer quadrant of the left breast was localized using a superior approach. Follow-up survey of the patient confirms presence of the radioactive seed. Order number of I-125 seed:  [id number]. Total activity:  1.694 millicuries  Reference Date: 07/30/2017 ------------------------------------------------ Using mammographic guidance, sterile technique, 1% lidocaine and an I-125 radioactive seed, the posterior an inferior aspect of the residual calcifications was localized using a superior approach. Follow-up survey of the patient confirms presence of the radioactive seed. Order number of I-125 seed:  [id number]. Total activity:  0.349 millicuries  Reference Date: 07/30/2017 The follow-up mammogram images confirm the seeds to be in the expected locations and were marked for Dr. Excell Seltzer. The patient tolerated the procedure well and was released from the [facility]. She was given instructions regarding seed removal.  IMPRESSION: Radioactive seed localization of 3 sites in the left breast. No apparent complications. Electronically Signed   By: Ammie Ferrier M.D.   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 EXAM: ULTRASOUND GUIDED RADIOACTIVE SEED LOCALIZATION OF THE LEFT AXILLA COMPARISON:  Previous exam(s). FINDINGS: Patient presents for radioactive seed localization prior to left breast lumpectomy and left axillary lymph node dissection. I met with the patient and we discussed the procedure of seed localization including benefits and alternatives. We discussed the high likelihood of a successful procedure. We discussed the risks of the procedure including infection, bleeding, tissue injury and further surgery. We discussed the low dose of radioactivity involved in the procedure. Informed, written consent was given. The usual time-out protocol was performed immediately prior to the procedure. Using ultrasound guidance, sterile technique, 1% lidocaine and an I-125 radioactive seed, the lymph node and associated biopsy marking clip was was localized using a inferior to superior approach. A follow-up mammogram was not performed as the spiral shaped biopsy marking clip in the left axillary lymph node was not visualized on the post biopsy mammograms due to the deep location. Follow-up survey of the patient confirms presence of the radioactive seed. Order number of I-125 seed:  [id number]. Total activity: 1.219 millicuries Reference Date: 07/30/2017 The patient tolerated the procedure well and was released from the [facility]. She was given instructions regarding seed removal. IMPRESSION: Radioactive seed localization left axilla. The radioactive seed was placed adjacent to the previously biopsied lymph node in the left axilla. Electronically Signed   By: Everlean Alstrom M.D.   On: 08/19/2017 11:07    Impression/Plan: Left Breast Cancer We discussed adjuvant radiotherapy today.  I recommend about 6 weeks of radiotherapy to the left breast/nodes in order to reduce risk of  locoregional recurrence by 2/3.  The risks, benefits and side effects of this treatment were discussed in detail.  She understands that radiotherapy is associated with skin irritation and fatigue in the acute setting. Late effects can include cosmetic changes and rare injury to internal organs.   She is enthusiastic about proceeding with treatment. A consent form has been signed and placed in her chart.  A total of  medically necessary complex treatment devices will be fabricated and supervised by me: 4 fields with MLCs for custom blocks to protect heart, and lungs;  and, a Vac-lok. MORE COMPLEX DEVICES MAY BE MADE IN DOSIMETRY FOR FIELD IN FIELD BEAMS FOR DOSE HOMOGENEITY.  I have requested : 3D Simulation which is medically necessary to give adequate dose to at risk tissues while sparing lungs and heart.  I have requested a DVH of the following structures: lungs, heart, left lumpectomy cavity.    The patient will receive 50 Gy in 25 fractions to the left breast with 2 fields. Also, 2 additional fields will treat SCV/Axillary nodes to 45 Gy in 25 fx. This will be followed by a boost.  Regarding the bothersome area on her left arm, I encouraged that it is likely a side effect of surgery and advised her to discuss it with Dr. Excell Seltzer at her next appointment.  I spent 20 minutes face to face with the patient and more than 50% of that time was spent in counseling and/or coordination of care. _____________________________________  Eppie Gibson, MD  This document serves as a record of services personally performed by Eppie Gibson, MD. It was created on her behalf by Rae Lips, a trained medical scribe. The creation of this record is based on the scribe's personal observations and the provider's statements to them. This document has been checked and approved by the attending provider.

## 2017-09-11 ENCOUNTER — Encounter: Payer: Self-pay | Admitting: Radiation Oncology

## 2017-09-11 ENCOUNTER — Ambulatory Visit
Admission: RE | Admit: 2017-09-11 | Discharge: 2017-09-11 | Disposition: A | Discharge status: Home / Self Care | Payer: 59 | Source: Ambulatory Visit | Attending: Radiation Oncology | Admitting: Radiation Oncology

## 2017-09-11 DIAGNOSIS — Z510 Encounter for antineoplastic radiation therapy: Secondary | ICD-10-CM | POA: Diagnosis not present

## 2017-09-11 DIAGNOSIS — C50812 Malignant neoplasm of overlapping sites of left female breast: Secondary | ICD-10-CM | POA: Insufficient documentation

## 2017-09-11 DIAGNOSIS — Z171 Estrogen receptor negative status [ER-]: Secondary | ICD-10-CM

## 2017-09-11 DIAGNOSIS — Z170 Estrogen receptor positive status [ER+]: Secondary | ICD-10-CM | POA: Diagnosis not present

## 2017-09-11 NOTE — Progress Notes (Signed)
Radiation Oncology         [phone] ________________________________  Name: Maria Hester MRN: [id number]  Date: 09/11/2017  DOB: 10-Mar-1966  SIMULATION AND TREATMENT PLANNING NOTE    Special treatment procedure   Outpatient  DIAGNOSIS:     ICD-10-CM   1. Malignant neoplasm of overlapping sites of left breast in female, estrogen receptor negative ([place]) C50.812    Z17.1     NARRATIVE:  The patient was brought to the [facility].  Identity was confirmed.  All relevant records and images related to the planned course of therapy were reviewed.  The patient freely provided informed written consent to proceed with treatment after reviewing the details related to the planned course of therapy. The consent form was witnessed and verified by the simulation staff.    Then, the patient was set-up in a stable reproducible supine position for radiation therapy with her ipsilateral arm over her head, and her upper body secured in a custom-made Vac-lok device.  CT images were obtained.  Surface markings were placed.  The CT images were loaded into the planning software.    Special treatment procedure:  Special treatment procedure was performed today due to the extra time and effort required by myself to plan and prepare this patient for deep inspiration breath hold technique.  I have determined cardiac sparing to be of benefit to this patient to prevent long term cardiac damage due to radiation of the heart.  Bellows were placed on the patient's abdomen. To facilitate cardiac sparing, the patient was coached by the radiation therapists on breath hold techniques and breathing practice was performed. Practice waveforms were obtained. The patient was then scanned while maintaining breath hold in the treatment position.  This image was then transferred over to the imaging specialist. The imaging specialist then created a fusion of the free breathing and breath hold scans using the chest  wall as the stable structure. I personally reviewed the fusion in axial, coronal and sagittal image planes.  Excellent cardiac sparing was obtained.  I felt the patient is an appropriate candidate for breath hold and the patient will be treated as such.  The image fusion was then reviewed with the patient to reinforce the necessity of reproducible breath hold.   TREATMENT PLANNING NOTE: Treatment planning then occurred.  The radiation prescription was entered and confirmed.     A total of 5 medically necessary complex treatment devices were fabricated and supervised by me: 4 fields with MLCs for custom blocks to protect heart, and lungs;  and, a Vac-lok. MORE COMPLEX DEVICES MAY BE MADE IN DOSIMETRY FOR FIELD IN FIELD BEAMS FOR DOSE HOMOGENEITY.  I have requested : 3D Simulation which is medically necessary to give adequate dose to at risk tissues while sparing lungs and heart.  I have requested a DVH of the following structures: lungs, heart, lumpectomy cavity.    The patient will receive 50 Gy in 25 fractions to the left breast with 2 tangential fields.  The SCV and axillary nodes will receive at least 45 Gy in 25 fractions with 2 opposed fields. This will  be followed by a boost.  Optical Surface Tracking Plan:  Since intensity modulated radiotherapy (IMRT) and 3D conformal radiation treatment methods are predicated on accurate and precise positioning for treatment, intrafraction motion monitoring is medically necessary to ensure accurate and safe treatment delivery. The ability to quantify intrafraction motion without excessive ionizing radiation dose can only be performed with optical surface  tracking. Accordingly, surface imaging offers the opportunity to obtain 3D measurements of patient position throughout IMRT and 3D treatments without excessive radiation exposure. I am ordering optical surface tracking for this patient's upcoming course of radiotherapy.  ________________________________    Reference:  Ursula Alert, J, et al. Surface imaging-based analysis of intrafraction motion for breast radiotherapy patients.Journal of [employer], n. 6, nov. 2014. ISSN [id number].  Available at: <http://www.jacmp.org/index.php/jacmp/article/view/4957>.    -----------------------------------  Eppie Gibson, MD

## 2017-09-14 ENCOUNTER — Telehealth: Payer: Self-pay | Admitting: Oncology

## 2017-09-14 NOTE — Telephone Encounter (Signed)
Left message for patient regarding upcoming December and January appointments per 11/27 sch message.

## 2017-09-16 ENCOUNTER — Telehealth: Payer: Self-pay | Admitting: Oncology

## 2017-09-16 DIAGNOSIS — Z510 Encounter for antineoplastic radiation therapy: Secondary | ICD-10-CM | POA: Diagnosis not present

## 2017-09-16 NOTE — Telephone Encounter (Signed)
Left message re appt. Schedule mailed

## 2017-09-18 ENCOUNTER — Ambulatory Visit
Admission: RE | Admit: 2017-09-18 | Discharge: 2017-09-18 | Disposition: A | Discharge status: Home / Self Care | Payer: 59 | Source: Ambulatory Visit | Attending: Radiation Oncology | Admitting: Radiation Oncology

## 2017-09-18 DIAGNOSIS — Z510 Encounter for antineoplastic radiation therapy: Secondary | ICD-10-CM | POA: Diagnosis not present

## 2017-09-21 ENCOUNTER — Ambulatory Visit: Payer: 59

## 2017-09-22 ENCOUNTER — Ambulatory Visit
Admission: RE | Admit: 2017-09-22 | Discharge: 2017-09-22 | Disposition: A | Discharge status: Home / Self Care | Payer: 59 | Source: Ambulatory Visit | Attending: Radiation Oncology | Admitting: Radiation Oncology

## 2017-09-22 ENCOUNTER — Ambulatory Visit: Payer: 59

## 2017-09-22 ENCOUNTER — Other Ambulatory Visit: Payer: 59

## 2017-09-22 ENCOUNTER — Ambulatory Visit (HOSPITAL_BASED_OUTPATIENT_CLINIC_OR_DEPARTMENT_OTHER): Payer: 59

## 2017-09-22 ENCOUNTER — Other Ambulatory Visit (HOSPITAL_BASED_OUTPATIENT_CLINIC_OR_DEPARTMENT_OTHER): Payer: 59

## 2017-09-22 VITALS — BP 104/57 | HR 63 | Temp 98.6°F | Resp 17 | Ht 65.0 in | Wt 194.5 lb

## 2017-09-22 DIAGNOSIS — Z5112 Encounter for antineoplastic immunotherapy: Secondary | ICD-10-CM

## 2017-09-22 DIAGNOSIS — C50812 Malignant neoplasm of overlapping sites of left female breast: Secondary | ICD-10-CM

## 2017-09-22 DIAGNOSIS — Z171 Estrogen receptor negative status [ER-]: Principal | ICD-10-CM

## 2017-09-22 DIAGNOSIS — Z510 Encounter for antineoplastic radiation therapy: Secondary | ICD-10-CM | POA: Diagnosis not present

## 2017-09-22 DIAGNOSIS — C50112 Malignant neoplasm of central portion of left female breast: Secondary | ICD-10-CM

## 2017-09-22 LAB — CBC WITH DIFFERENTIAL/PLATELET
BASO%: 0.2 % (ref 0.0–2.0)
BASOS ABS: 0 10*3/uL (ref 0.0–0.1)
EOS ABS: 0.3 10*3/uL (ref 0.0–0.5)
EOS%: 4.4 % (ref 0.0–7.0)
HCT: 36.4 % (ref 34.8–46.6)
HGB: 11.6 g/dL (ref 11.6–15.9)
LYMPH%: 48.6 % (ref 14.0–49.7)
MCH: 29.9 pg (ref 25.1–34.0)
MCHC: 31.9 g/dL (ref 31.5–36.0)
MCV: 93.8 fL (ref 79.5–101.0)
MONO#: 0.5 10*3/uL (ref 0.1–0.9)
MONO%: 7.6 % (ref 0.0–14.0)
NEUT#: 2.3 10*3/uL (ref 1.5–6.5)
NEUT%: 39.2 % (ref 38.4–76.8)
Platelets: 277 10*3/uL (ref 145–400)
RBC: 3.88 10*6/uL (ref 3.70–5.45)
RDW: 13.5 % (ref 11.2–14.5)
WBC: 6 10*3/uL (ref 3.9–10.3)
lymph#: 2.9 10*3/uL (ref 0.9–3.3)

## 2017-09-22 LAB — COMPREHENSIVE METABOLIC PANEL
ALBUMIN: 3.6 g/dL (ref 3.5–5.0)
ALK PHOS: 71 U/L (ref 40–150)
ALT: 20 U/L (ref 0–55)
AST: 20 U/L (ref 5–34)
Anion Gap: 11 mEq/L (ref 3–11)
BILIRUBIN TOTAL: 0.25 mg/dL (ref 0.20–1.20)
BUN: 19.6 mg/dL (ref 7.0–26.0)
CO2: 23 mEq/L (ref 22–29)
CREATININE: 0.8 mg/dL (ref 0.6–1.1)
Calcium: 9.3 mg/dL (ref 8.4–10.4)
Chloride: 106 mEq/L (ref 98–109)
EGFR: >60 mL/min/{1.73_m2} (ref >60)
GLUCOSE: 115 mg/dL (ref 70–140)
Potassium: 4.2 mEq/L (ref 3.5–5.1)
SODIUM: 140 meq/L (ref 136–145)
TOTAL PROTEIN: 6.8 g/dL (ref 6.4–8.3)

## 2017-09-22 MED ORDER — SODIUM CHLORIDE 0.9% FLUSH
10.0000 mL | Freq: Once | INTRAVENOUS | Status: AC
  Administered 2017-09-22: 10 mL
  Filled 2017-09-22: qty 10

## 2017-09-22 MED ORDER — ACETAMINOPHEN 325 MG PO TABS
650.0000 mg | ORAL_TABLET | Freq: Once | ORAL | Status: AC
  Administered 2017-09-22: 650 mg via ORAL

## 2017-09-22 MED ORDER — ALRA NON-METALLIC DEODORANT (RAD-ONC)
1.0000 "application " | Freq: Once | TOPICAL | Status: AC
  Administered 2017-09-22: 1 via TOPICAL

## 2017-09-22 MED ORDER — ACETAMINOPHEN 325 MG PO TABS
ORAL_TABLET | ORAL | Status: AC
  Filled 2017-09-22: qty 2

## 2017-09-22 MED ORDER — HEPARIN SOD (PORK) LOCK FLUSH 100 UNIT/ML IV SOLN
500.0000 [IU] | Freq: Once | INTRAVENOUS | Status: AC | PRN
  Administered 2017-09-22: 500 [IU]
  Filled 2017-09-22: qty 5

## 2017-09-22 MED ORDER — DIPHENHYDRAMINE HCL 25 MG PO CAPS
25.0000 mg | ORAL_CAPSULE | Freq: Once | ORAL | Status: AC
  Administered 2017-09-22: 25 mg via ORAL

## 2017-09-22 MED ORDER — TRASTUZUMAB CHEMO 150 MG IV SOLR
6.0000 mg/kg | Freq: Once | INTRAVENOUS | Status: AC
  Administered 2017-09-22: 525 mg via INTRAVENOUS
  Filled 2017-09-22: qty 25

## 2017-09-22 MED ORDER — RADIAPLEXRX EX GEL
Freq: Two times a day (BID) | CUTANEOUS | Status: DC
  Administered 2017-09-22: 17:00:00 via TOPICAL

## 2017-09-22 MED ORDER — SODIUM CHLORIDE 0.9 % IV SOLN
Freq: Once | INTRAVENOUS | Status: AC
  Administered 2017-09-22: 13:00:00 via INTRAVENOUS

## 2017-09-22 MED ORDER — SODIUM CHLORIDE 0.9% FLUSH
10.0000 mL | INTRAVENOUS | Status: DC | PRN
  Administered 2017-09-22: 10 mL
  Filled 2017-09-22: qty 10

## 2017-09-22 MED ORDER — DIPHENHYDRAMINE HCL 25 MG PO CAPS
ORAL_CAPSULE | ORAL | Status: AC
  Filled 2017-09-22: qty 1

## 2017-09-22 NOTE — Progress Notes (Signed)

## 2017-09-22 NOTE — Patient Instructions (Signed)
[facility] Discharge Instructions for Patients Receiving Chemotherapy  Today you received the following chemotherapy agents: Trantuzumab (Herceptin).  To help prevent nausea and vomiting after your treatment, we encourage you to take your nausea medication as prescribed.  If you develop nausea and vomiting that is not controlled by your nausea medication, call the clinic.   BELOW ARE SYMPTOMS THAT SHOULD BE REPORTED IMMEDIATELY:  *FEVER GREATER THAN 100.5 F  *CHILLS WITH OR WITHOUT FEVER  NAUSEA AND VOMITING THAT IS NOT CONTROLLED WITH YOUR NAUSEA MEDICATION  *UNUSUAL SHORTNESS OF BREATH  *UNUSUAL BRUISING OR BLEEDING  TENDERNESS IN MOUTH AND THROAT WITH OR WITHOUT PRESENCE OF ULCERS  *URINARY PROBLEMS  *BOWEL PROBLEMS  UNUSUAL RASH Items with * indicate a potential emergency and should be followed up as soon as possible.  Feel free to call the clinic should you have any questions or concerns. The clinic phone number is [phone].  Please show the [facility] at check-in to the Emergency Department and triage nurse.

## 2017-09-23 ENCOUNTER — Ambulatory Visit
Admission: RE | Admit: 2017-09-23 | Discharge: 2017-09-23 | Disposition: A | Discharge status: Home / Self Care | Payer: 59 | Source: Ambulatory Visit | Attending: Radiation Oncology | Admitting: Radiation Oncology

## 2017-09-23 DIAGNOSIS — Z510 Encounter for antineoplastic radiation therapy: Secondary | ICD-10-CM | POA: Diagnosis not present

## 2017-09-24 ENCOUNTER — Ambulatory Visit
Admission: RE | Admit: 2017-09-24 | Discharge: 2017-09-24 | Disposition: A | Discharge status: Home / Self Care | Payer: 59 | Source: Ambulatory Visit | Attending: Radiation Oncology | Admitting: Radiation Oncology

## 2017-09-24 DIAGNOSIS — Z510 Encounter for antineoplastic radiation therapy: Secondary | ICD-10-CM | POA: Diagnosis not present

## 2017-09-25 ENCOUNTER — Ambulatory Visit
Admission: RE | Admit: 2017-09-25 | Discharge: 2017-09-25 | Disposition: A | Discharge status: Home / Self Care | Payer: 59 | Source: Ambulatory Visit | Attending: Radiation Oncology | Admitting: Radiation Oncology

## 2017-09-25 DIAGNOSIS — Z510 Encounter for antineoplastic radiation therapy: Secondary | ICD-10-CM | POA: Diagnosis not present

## 2017-09-28 ENCOUNTER — Ambulatory Visit
Admission: RE | Admit: 2017-09-28 | Discharge: 2017-09-28 | Disposition: A | Discharge status: Home / Self Care | Payer: 59 | Source: Ambulatory Visit | Attending: Radiation Oncology | Admitting: Radiation Oncology

## 2017-09-28 DIAGNOSIS — Z510 Encounter for antineoplastic radiation therapy: Secondary | ICD-10-CM | POA: Diagnosis not present

## 2017-09-29 ENCOUNTER — Ambulatory Visit
Admission: RE | Admit: 2017-09-29 | Discharge: 2017-09-29 | Disposition: A | Discharge status: Home / Self Care | Payer: 59 | Source: Ambulatory Visit | Attending: Radiation Oncology | Admitting: Radiation Oncology

## 2017-09-29 DIAGNOSIS — Z510 Encounter for antineoplastic radiation therapy: Secondary | ICD-10-CM | POA: Diagnosis not present

## 2017-09-30 ENCOUNTER — Ambulatory Visit (HOSPITAL_BASED_OUTPATIENT_CLINIC_OR_DEPARTMENT_OTHER)
Admission: RE | Admit: 2017-09-30 | Discharge: 2017-09-30 | Disposition: A | Discharge status: Home / Self Care | Payer: 59 | Source: Ambulatory Visit | Attending: Cardiology | Admitting: Cardiology

## 2017-09-30 ENCOUNTER — Encounter (HOSPITAL_COMMUNITY): Payer: Self-pay | Admitting: Cardiology

## 2017-09-30 ENCOUNTER — Ambulatory Visit (HOSPITAL_COMMUNITY)
Admission: RE | Admit: 2017-09-30 | Discharge: 2017-09-30 | Disposition: A | Discharge status: Home / Self Care | Payer: 59 | Source: Ambulatory Visit | Attending: Obstetrics and Gynecology | Admitting: Obstetrics and Gynecology

## 2017-09-30 ENCOUNTER — Ambulatory Visit
Admission: RE | Admit: 2017-09-30 | Discharge: 2017-09-30 | Disposition: A | Discharge status: Home / Self Care | Payer: 59 | Source: Ambulatory Visit | Attending: Radiation Oncology | Admitting: Radiation Oncology

## 2017-09-30 VITALS — BP 113/54 | HR 53 | Wt 198.4 lb

## 2017-09-30 DIAGNOSIS — Z87891 Personal history of nicotine dependence: Secondary | ICD-10-CM | POA: Diagnosis not present

## 2017-09-30 DIAGNOSIS — C50812 Malignant neoplasm of overlapping sites of left female breast: Secondary | ICD-10-CM | POA: Diagnosis not present

## 2017-09-30 DIAGNOSIS — N649 Disorder of breast, unspecified: Secondary | ICD-10-CM | POA: Insufficient documentation

## 2017-09-30 DIAGNOSIS — Z79899 Other long term (current) drug therapy: Secondary | ICD-10-CM | POA: Insufficient documentation

## 2017-09-30 DIAGNOSIS — Z171 Estrogen receptor negative status [ER-]: Secondary | ICD-10-CM | POA: Diagnosis not present

## 2017-09-30 DIAGNOSIS — Z9889 Other specified postprocedural states: Secondary | ICD-10-CM | POA: Diagnosis not present

## 2017-09-30 DIAGNOSIS — Z8249 Family history of ischemic heart disease and other diseases of the circulatory system: Secondary | ICD-10-CM | POA: Diagnosis not present

## 2017-09-30 DIAGNOSIS — Z510 Encounter for antineoplastic radiation therapy: Secondary | ICD-10-CM | POA: Diagnosis not present

## 2017-09-30 DIAGNOSIS — E785 Hyperlipidemia, unspecified: Secondary | ICD-10-CM | POA: Diagnosis not present

## 2017-09-30 NOTE — Patient Instructions (Signed)
Your physician recommends that you schedule a follow-up appointment in: As needed  

## 2017-09-30 NOTE — Progress Notes (Signed)
  Echocardiogram 2D Echocardiogram has been performed.  Bobbye Charleston 09/30/2017, 9:55 AM

## 2017-09-30 NOTE — Progress Notes (Signed)
Oncology: Dr. Jana Hakim  51 yo with history of hyperlipidemia was diagnosed with breast cancer and was referred for cardio-oncology evaluation.  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  She had neadjuvant chemotherapy with carboplatin/Taxotere + trastuzumab/pertuzumab x 6 cycles starting 03/31/17. She then continued trastuzumab for a year, recently completed. She had lumpectomy in 11/18 and is now getting radiation.   No chest pain or exertional dyspnea.  No complaints today.    PMH: 1. Hyperlipidemia 2. 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  She will have neadjuvant chemotherapy with carboplatin/Taxotere + trastuzumab/pertuzumab x 6 cycles starting 03/31/17. She will then continue trastuzumab for a year. Lumpectomy in 11/18.  Now getting radiation.  - Echo (6/18): EF 55%, GLS -18.9%.  - Echo (9/18): EF 55%, GLS -19%, mildly dilated RV with normal systolic function.  - Echo (12/18): EF 55-60%, GLS -02.5%, normal diastolic function, normal RV size and systolic function.  FH: Mother with possible viral myocarditis and CHF, father with MI.   Social History   Socioeconomic History  . Marital status: Married    Spouse name: Not on file  . Number of children: Not on file  . Years of education: Not on file  . Highest education level: Not on file  Social Needs  . Financial resource strain: Not on file  . Food insecurity - worry: Not on file  . Food insecurity - inability: Not on file  . Transportation needs - medical: Not on file  . Transportation needs - non-medical: Not on file  Occupational History  . Not on file  Tobacco Use  . Smoking status: Former Smoker    Packs/day: 0.50    Years: 34.00    Pack years: 17.00    Types: Cigarettes    Last attempt to quit: 10/13/2013    Years since quitting: 3.9  . Smokeless tobacco: Never Used  Substance and Sexual Activity  . Alcohol use: No    Comment: social drink   . Drug use: No  . Sexual activity: Yes    Birth control/protection: Surgical     Comment: hyst  Other Topics Concern  . Not on file  Social History Narrative  . Not on file   ROS: All systems reviewed and negative except as per HPI.   Current Outpatient Medications  Medication Sig Dispense Refill  . acetaminophen (TYLENOL) 500 MG tablet Take 500 mg by mouth daily as needed for moderate pain or headache.    . ALPRAZolam (XANAX) 0.5 MG tablet TAKE HALF TO 1 TABLET BY MOUTH DAILY AS NEEDED FOR ANXIETY  2  . bismuth subsalicylate (PEPTO BISMOL) 262 MG/15ML suspension Take 30 mLs by mouth every 6 (six) hours as needed for indigestion.    Marland Kitchen esomeprazole (NEXIUM) 40 MG capsule Take 1 capsule (40 mg total) by mouth 2 (two) times daily before a meal. (Patient taking differently: Take 40 mg by mouth daily. ) 60 capsule 2  . lidocaine-prilocaine (EMLA) cream Apply 1 application topically as needed (port access).    Marland Kitchen oxyCODONE (OXY IR/ROXICODONE) 5 MG immediate release tablet Take 1 tablet (5 mg total) every 6 (six) hours as needed by mouth for severe pain. 15 tablet 0  . rosuvastatin (CRESTOR) 10 MG tablet Take 10 mg by mouth every other day. At bedtime  0  . ondansetron (ZOFRAN) 4 MG tablet Take 1 tablet (4 mg total) every 8 (eight) hours as needed by mouth for nausea or vomiting. (Patient not taking: Reported on 09/01/2017) 10 tablet 1   No current facility-administered medications  for this encounter.    Facility-Administered Medications Ordered in Other Encounters  Medication Dose Route Frequency Provider Last Rate Last Dose  . heparin lock flush 100 unit/mL  500 Units Intracatheter Once Magrinat, Virgie Dad, MD      . sodium chloride flush (NS) 0.9 % injection 10 mL  10 mL Intracatheter Once Magrinat, Virgie Dad, MD       BP (!) 113/54   Pulse (!) 53   Wt 198 lb 6.4 oz (90 kg)   LMP 02/11/2014   SpO2 99%   BMI 33.02 kg/m ' General: NAD Neck: No JVD, no thyromegaly or thyroid nodule.  Lungs: Clear to auscultation bilaterally with normal respiratory effort. CV:  Nondisplaced PMI.  Heart regular S1/S2, no S3/S4, no murmur.  No peripheral edema.  No carotid bruit.  Normal pedal pulses.  Abdomen: Soft, nontender, no hepatosplenomegaly, no distention.  Skin: Intact without lesions or rashes.  Neurologic: Alert and oriented x 3.  Psych: Normal affect. Extremities: No clubbing or cyanosis.  HEENT: Normal.   Assessment/Plan: 51 yo with breast cancer.  She has completed Herceptin therapy.  I reviewed today's echo: normal EF and strain pattern. She will not need additional screening echoes.  Followup prn.   Loralie Champagne 09/30/2017

## 2017-10-01 ENCOUNTER — Ambulatory Visit
Admission: RE | Admit: 2017-10-01 | Discharge: 2017-10-01 | Disposition: A | Discharge status: Home / Self Care | Payer: 59 | Source: Ambulatory Visit | Attending: Radiation Oncology | Admitting: Radiation Oncology

## 2017-10-01 DIAGNOSIS — Z510 Encounter for antineoplastic radiation therapy: Secondary | ICD-10-CM | POA: Diagnosis not present

## 2017-10-02 ENCOUNTER — Ambulatory Visit: Payer: 59

## 2017-10-05 ENCOUNTER — Ambulatory Visit
Admission: RE | Admit: 2017-10-05 | Discharge: 2017-10-05 | Disposition: A | Discharge status: Home / Self Care | Payer: 59 | Source: Ambulatory Visit | Attending: Radiation Oncology | Admitting: Radiation Oncology

## 2017-10-05 DIAGNOSIS — Z510 Encounter for antineoplastic radiation therapy: Secondary | ICD-10-CM | POA: Diagnosis not present

## 2017-10-07 ENCOUNTER — Ambulatory Visit
Admission: RE | Admit: 2017-10-07 | Discharge: 2017-10-07 | Disposition: A | Discharge status: Home / Self Care | Payer: 59 | Source: Ambulatory Visit | Attending: Radiation Oncology | Admitting: Radiation Oncology

## 2017-10-07 DIAGNOSIS — Z510 Encounter for antineoplastic radiation therapy: Secondary | ICD-10-CM | POA: Diagnosis not present

## 2017-10-08 ENCOUNTER — Ambulatory Visit
Admission: RE | Admit: 2017-10-08 | Discharge: 2017-10-08 | Disposition: A | Discharge status: Home / Self Care | Payer: 59 | Source: Ambulatory Visit | Attending: Radiation Oncology | Admitting: Radiation Oncology

## 2017-10-08 DIAGNOSIS — Z510 Encounter for antineoplastic radiation therapy: Secondary | ICD-10-CM | POA: Diagnosis not present

## 2017-10-09 ENCOUNTER — Ambulatory Visit
Admission: RE | Admit: 2017-10-09 | Discharge: 2017-10-09 | Disposition: A | Discharge status: Home / Self Care | Payer: 59 | Source: Ambulatory Visit | Attending: Radiation Oncology | Admitting: Radiation Oncology

## 2017-10-09 ENCOUNTER — Telehealth: Payer: Self-pay

## 2017-10-09 DIAGNOSIS — Z510 Encounter for antineoplastic radiation therapy: Secondary | ICD-10-CM | POA: Diagnosis not present

## 2017-10-09 NOTE — Telephone Encounter (Signed)
Spoke with Shanon Brow at [facility] to do Quantity override for patients esomeprazole 40mg  BID dosing.  It has been approved thru 10/09/2020. [facility] in [place] informed.

## 2017-10-12 ENCOUNTER — Ambulatory Visit
Admission: RE | Admit: 2017-10-12 | Discharge: 2017-10-12 | Disposition: A | Discharge status: Home / Self Care | Payer: 59 | Source: Ambulatory Visit | Attending: Radiation Oncology | Admitting: Radiation Oncology

## 2017-10-12 DIAGNOSIS — Z510 Encounter for antineoplastic radiation therapy: Secondary | ICD-10-CM | POA: Diagnosis not present

## 2017-10-14 ENCOUNTER — Ambulatory Visit: Payer: 59

## 2017-10-14 ENCOUNTER — Other Ambulatory Visit: Payer: 59

## 2017-10-14 ENCOUNTER — Ambulatory Visit (HOSPITAL_BASED_OUTPATIENT_CLINIC_OR_DEPARTMENT_OTHER): Payer: 59

## 2017-10-14 ENCOUNTER — Ambulatory Visit
Admission: RE | Admit: 2017-10-14 | Discharge: 2017-10-14 | Disposition: A | Discharge status: Home / Self Care | Payer: 59 | Source: Ambulatory Visit | Attending: Radiation Oncology | Admitting: Radiation Oncology

## 2017-10-14 ENCOUNTER — Inpatient Hospital Stay: Payer: 59 | Attending: Oncology | Admitting: Oncology

## 2017-10-14 VITALS — BP 109/52 | HR 68 | Temp 98.0°F | Resp 18 | Ht 65.0 in | Wt 199.0 lb

## 2017-10-14 DIAGNOSIS — C50812 Malignant neoplasm of overlapping sites of left female breast: Secondary | ICD-10-CM

## 2017-10-14 DIAGNOSIS — Z171 Estrogen receptor negative status [ER-]: Secondary | ICD-10-CM

## 2017-10-14 DIAGNOSIS — Z510 Encounter for antineoplastic radiation therapy: Secondary | ICD-10-CM | POA: Diagnosis not present

## 2017-10-14 DIAGNOSIS — Z95828 Presence of other vascular implants and grafts: Secondary | ICD-10-CM

## 2017-10-14 LAB — CBC WITH DIFFERENTIAL/PLATELET
BASO%: 0.4 % (ref 0.0–2.0)
Basophils Absolute: 0 10*3/uL (ref 0.0–0.1)
EOS%: 1.3 % (ref 0.0–7.0)
Eosinophils Absolute: 0.1 10*3/uL (ref 0.0–0.5)
HCT: 36.2 % (ref 34.8–46.6)
HEMOGLOBIN: 11.6 g/dL (ref 11.6–15.9)
LYMPH%: 34.1 % (ref 14.0–49.7)
MCH: 29.1 pg (ref 25.1–34.0)
MCHC: 32 g/dL (ref 31.5–36.0)
MCV: 90.7 fL (ref 79.5–101.0)
MONO#: 0.5 10*3/uL (ref 0.1–0.9)
MONO%: 8.7 % (ref 0.0–14.0)
NEUT%: 55.5 % (ref 38.4–76.8)
NEUTROS ABS: 3.1 10*3/uL (ref 1.5–6.5)
PLATELETS: 275 10*3/uL (ref 145–400)
RBC: 3.99 10*6/uL (ref 3.70–5.45)
RDW: 13.5 % (ref 11.2–14.5)
WBC: 5.5 10*3/uL (ref 3.9–10.3)
lymph#: 1.9 10*3/uL (ref 0.9–3.3)

## 2017-10-14 LAB — COMPREHENSIVE METABOLIC PANEL
ALBUMIN: 3.6 g/dL (ref 3.5–5.0)
ALT: 23 U/L (ref 0–55)
AST: 20 U/L (ref 5–34)
Alkaline Phosphatase: 83 U/L (ref 40–150)
Anion Gap: 9 mEq/L (ref 3–11)
BILIRUBIN TOTAL: 0.23 mg/dL (ref 0.20–1.20)
BUN: 15.9 mg/dL (ref 7.0–26.0)
CO2: 25 meq/L (ref 22–29)
CREATININE: 0.7 mg/dL (ref 0.6–1.1)
Calcium: 9.3 mg/dL (ref 8.4–10.4)
Chloride: 105 mEq/L (ref 98–109)
EGFR: >60 mL/min/{1.73_m2} (ref >60)
GLUCOSE: 92 mg/dL (ref 70–140)
Potassium: 4.3 mEq/L (ref 3.5–5.1)
SODIUM: 139 meq/L (ref 136–145)
TOTAL PROTEIN: 6.8 g/dL (ref 6.4–8.3)

## 2017-10-14 MED ORDER — SODIUM CHLORIDE 0.9% FLUSH
10.0000 mL | Freq: Once | INTRAVENOUS | Status: AC
  Administered 2017-10-14: 10 mL
  Filled 2017-10-14: qty 10

## 2017-10-14 NOTE — Progress Notes (Signed)
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 She continues under observation.  She was actually scheduled I think by her to receive treatment today but after much discussion we decided to forego that and she was de-accessed prior to ending the visit.  Her last echo 09/30/2017 showing an ejection fraction in the 55-60% range. She also started radiation therapy 09/23/2017. She notes that radiation is going well and her last day is 11/05/2016. She notes that she has a rash in the affected area that she aids with hydrocortisone and radiaplex cream.    REVIEW OF SYSTEMS: Jerrie reports that she slept through the New Year, and she had spaghetti and meatballs for the first time since she started treatment. She notes that she didn't want to eat it during chemotherapy because she didn't want the taste to disappoint her. She notes that she has started monitoring her diet. She also just started walking 10,000 steps per day and counts this with her phone and FitBit. She reports that she is having frequent muscle cramps thoughout her body, particularly in the hands, back and under her rib cage that could occur anytime. She notes that she is not drinking enough water. If she gets cramps, she bares with it until it goes away. Otherwise, she  feels well. She notes that she is on Cephlexin for a sinus cold. She denies unusual headaches, visual changes, nausea, vomiting, or dizziness. There has been no unusual cough, phlegm production, or pleurisy. This been no change in bowel or bladder habits. She denies unexplained fatigue or unexplained weight loss, bleeding, rash, or fever. A detailed review of systems was otherwise stable.   BREAST CANCER HISTORY: From the original intake note:  Shephanie herself noted a change in her left breast when looking into a mirror and then palpating a mass in the upper-outer quadrant. She brought this to medical attention and on 02/24/2017 underwent bilateral diagnostic mammography with tomography and left breast ultrasonography at the breast Center. The breast density was category C. In the upper portion of the left breast there was a broad area of distortion measuring up to 7.5 cm. On exam there was a broad firm area involving the upper outer quadrant of the left breast which was visibly protruding. By ultrasound at the 11:00 axis 8 cm from the nipple there was an irregular hypoechoic mass measuring 1.4 cm with additiona ases at 2:00, 3 cm from the nipple and multiple other masses as described, all in the upper-outer quadrant primarily. Ultrasound of the left axilla showed at least 3 prominent lymph nodes one of which had a cortical bulge.  On 03/02/2017 the patient underwent biopsy of a left breast mass at the 11:30 o'clock position and a second mass described as upper outer quadrant as well as one of the suspicious left axillary lymph nodes. These all showed invasive ductal carcinoma, grade 3. Prognostic panel from one of the 2 masses showed the tumor to  be estrogen and progesterone receptor negative, but HER-2 amplified, with a signals ratio of 8.24 and the number per cell 15.65.  Her subsequent history is as detailed below   PAST MEDICAL HISTORY: Past Medical History:  Diagnosis Date  . Breast cancer ([place])    . 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 Cataracts, bilateral   . Complication of anesthesia    blood pressure very low after surgery difficult to wake -1st surgery only-when pt was 52 y.o.  . Family history of breast cancer   . Gallbladder disease    "sludge in gallbladder",   . History of hiatal hernia   . Hyperlipidemia   . Uterine fibroid     PAST SURGICAL HISTORY: Past Surgical History:  Procedure Laterality Date  . ABDOMINAL HYSTERECTOMY    . ABDOMINOPLASTY N/A 02/28/2014   Procedure: ABDOMINOPLASTY with Panniculectomy;  Surgeon: [name], MD;  Location: [facility];  Service: Gynecology;  Laterality: N/A;  . APPENDECTOMY    . BILATERAL SALPINGECTOMY Bilateral 02/28/2014   Procedure: BILATERAL SALPINGECTOMY;  Surgeon: [name], MD;  Location: [facility];  Service: Gynecology;  Laterality: Bilateral;  . BREAST LUMPECTOMY WITH RADIOACTIVE SEED AND SENTINEL LYMPH NODE BIOPSY Left 08/19/2017   Procedure: LEFT BREAST LUMPECTOMY WITH RADIOACTIVE SEED AND LEFT SENTINEL LYMPH NODE BIOPSY;  Surgeon: Excell Seltzer, MD;  Location: [facility];  Service: General;  Laterality: Left;  . CATARACT EXTRACTION, BILATERAL    . CHOLECYSTECTOMY N/A 08/19/2017   Procedure: LAPAROSCOPIC CHOLECYSTECTOMY WITH INTRAOPERATIVE CHOLANGIOGRAM;  Surgeon: Excell Seltzer, MD;  Location: [facility];  Service: General;  Laterality: N/A;  . PORTACATH PLACEMENT Right 03/18/2017   Procedure: INSERTION PORT-A-CATH;  Surgeon: Excell Seltzer, MD;  Location: [facility];  Service: General;  Laterality: Right;  . reverse tubal ligation    . SCAR REVISION N/A 02/28/2014   Procedure: SCAR REVISION;  Surgeon: [name], MD;  Location: [facility];  Service: Gynecology;  Laterality: N/A;  . SUPRACERVICAL ABDOMINAL HYSTERECTOMY N/A 02/28/2014   Procedure: HYSTERECTOMY SUPRACERVICAL ABDOMINAL;  Surgeon: [name], MD;  Location: [facility];  Service: Gynecology;   Laterality: N/A;  . TUBAL LIGATION    . WISDOM TOOTH EXTRACTION      FAMILY HISTORY Family History  Problem Relation Age of Onset  . Heart disease Mother   . Hypertension Mother   . Diabetes Father   . Heart disease Father   . Heart disease Maternal Grandmother   . Colon cancer Maternal Grandmother 62       died in her 4's  . Heart disease Maternal Grandfather   . Esophageal cancer Maternal Grandfather 50       died at 33  . Diabetes Paternal Grandmother   . Heart disease Paternal Grandmother   . Kidney cancer Paternal Grandmother 53       died in her 15's  . Tuberculosis Paternal Grandfather   . Heart disease Paternal Grandfather   . Skin cancer Paternal Grandfather 61       died at 66, patient not sure if it was melanoma, BCC, Squamous, etc.  . Breast cancer Maternal Aunt 50       died at 59  . Breast cancer Cousin 76       she is now in her 56's  . Rectal cancer Neg Hx   . Stomach cancer Neg Hx   The patient's father died from heart disease at age 53. The  patient's mother is living at age 75. The patient has one brother, 2 sisters. On the mother's side and aunt had breast cancer at age 59 and her daughter had breast cancer in her early 65s. There is also a grandfather with esophageal cancer and a grandmother with colon cancer. On the paternal side there is a grandmother with kidney cancer at an early age and a grandfather with melanoma at an early age.  GYNECOLOGIC HISTORY:  Patient's last menstrual period was 02/11/2014.  Menarche age 62, first live birth age 52, the patient is [place] P2. She underwent hysterectomy without salpingo-oophorectomy May 2016. She did not use hormone replacement. She did use oral contraceptives approximately 10 years in the 1980s.  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 Her husband Fish farm manager in the same [place], which is a 10% aviation enrollment. Daughter Lorenda Hatchet is a  hairstylist in [place] and son Chip Boer is a Physiological scientist in [place] the patient has 2 grandsons and one granddaughter. She attends a [employer]    ADVANCED DIRECTIVES: The patient has completed advanced directives and will have the motorized at the next visit   HEALTH MAINTENANCE: Social History   Tobacco Use  . Smoking status: Former Smoker    Packs/day: 0.50    Years: 34.00    Pack years: 17.00    Types: Cigarettes    Last attempt to quit: 10/13/2013    Years since quitting: 4.0  . Smokeless tobacco: Never Used  Substance Use Topics  . Alcohol use: No    Comment: social drink   . Drug use: No     Colonoscopy:No  PAP: 03/04/2017  Bone density: No   No Known Allergies  Current Outpatient Medications  Medication Sig Dispense Refill  . acetaminophen (TYLENOL) 500 MG tablet Take 500 mg by mouth daily as needed for moderate pain or headache.    . ALPRAZolam (XANAX) 0.5 MG tablet TAKE HALF TO 1 TABLET BY MOUTH DAILY AS NEEDED FOR ANXIETY  2  . bismuth subsalicylate (PEPTO BISMOL) 262 MG/15ML suspension Take 30 mLs by mouth every 6 (six) hours as needed for indigestion.    Marland Kitchen esomeprazole (NEXIUM) 40 MG capsule Take 1 capsule (40 mg total) by mouth 2 (two) times daily before a meal. (Patient taking differently: Take 40 mg by mouth daily. ) 60 capsule 2  . lidocaine-prilocaine (EMLA) cream Apply 1 application topically as needed (port access).    . ondansetron (ZOFRAN) 4 MG tablet Take 1 tablet (4 mg total) every 8 (eight) hours as needed by mouth for nausea or vomiting. (Patient not taking: Reported on 09/01/2017) 10 tablet 1  . oxyCODONE (OXY IR/ROXICODONE) 5 MG immediate release tablet Take 1 tablet (5 mg total) every 6 (six) hours as needed by mouth for severe pain. 15 tablet 0  . rosuvastatin (CRESTOR) 10 MG tablet Take 10 mg by mouth every other day. At bedtime  0   No current facility-administered medications for this visit.    Facility-Administered  Medications Ordered in Other Visits  Medication Dose Route Frequency Provider Last Rate Last Dose  . heparin lock flush 100 unit/mL  500 Units Intracatheter Once Breyon Sigg, Virgie Dad, MD      . sodium chloride flush (NS) 0.9 % injection 10 mL  10 mL Intracatheter Once Kenric Ginger, Virgie Dad, MD        OBJECTIVE:Middle-aged white woman who appears well  Vitals:   10/14/17 1300  BP: (!) 109/52  Pulse: 68  Resp: 18  Temp: 98 F (36.7 C)  SpO2: 98%     Body mass index is 33.12 kg/m.    ECOG FS:0 - Asymptomatic  Sclerae unicteric, EOMs intact Oropharynx clear and moist No cervical or supraclavicular adenopathy Lungs no rales or rhonchi Heart regular rate and rhythm Abd soft, nontender, positive bowel sounds MSK no focal spinal tenderness, no upper extremity lymphedema Neuro: nonfocal, well oriented, appropriate affect Breasts: The right breast is unremarkable.  Left breast is status post lumpectomy and is currently undergoing radiation.  There is some tenderness to palpation, very mild erythema, no desquamation.  There are no suspicious findings.  Both axillae are benign.  LAB RESULTS:  CMP     Component Value Date/Time   NA 140 09/22/2017 1208   K 4.2 09/22/2017 1208   CL 108 08/13/2017 0913   CO2 23 09/22/2017 1208   GLUCOSE 115 09/22/2017 1208   BUN 19.6 09/22/2017 1208   CREATININE 0.8 09/22/2017 1208   CALCIUM 9.3 09/22/2017 1208   PROT 6.8 09/22/2017 1208   ALBUMIN 3.6 09/22/2017 1208   AST 20 09/22/2017 1208   ALT 20 09/22/2017 1208   ALKPHOS 71 09/22/2017 1208   BILITOT 0.25 09/22/2017 1208   GFRNONAA >60 08/13/2017 0913   GFRAA >60 08/13/2017 0913    No results found for: TOTALPROTELP, ALBUMINELP, A1GS, A2GS, BETS, BETA2SER, GAMS, MSPIKE, SPEI  No results found for: Nils Pyle, [facility]  Lab Results  Component Value Date   WBC 5.5 10/14/2017   NEUTROABS 3.1 10/14/2017   HGB 11.6 10/14/2017   HCT 36.2 10/14/2017   MCV 90.7 10/14/2017   PLT  275 10/14/2017      Chemistry      Component Value Date/Time   NA 140 09/22/2017 1208   K 4.2 09/22/2017 1208   CL 108 08/13/2017 0913   CO2 23 09/22/2017 1208   BUN 19.6 09/22/2017 1208   CREATININE 0.8 09/22/2017 1208      Component Value Date/Time   CALCIUM 9.3 09/22/2017 1208   ALKPHOS 71 09/22/2017 1208   AST 20 09/22/2017 1208   ALT 20 09/22/2017 1208   BILITOT 0.25 09/22/2017 1208       No results found for: LABCA2  No components found for: [id number]  No results for input(s): INR in the last 168 hours.  Urinalysis    Component Value Date/Time   COLORURINE YELLOW 02/23/2014 1004   APPEARANCEUR CLEAR 02/23/2014 1004   LABSPEC 1.025 02/23/2014 1004   PHURINE 5.5 02/23/2014 1004   GLUCOSEU NEGATIVE 02/23/2014 1004   HGBUR NEGATIVE 02/23/2014 1004   BILIRUBINUR NEGATIVE 02/23/2014 1004   KETONESUR NEGATIVE 02/23/2014 1004   PROTEINUR NEGATIVE 02/23/2014 1004   UROBILINOGEN 0.2 02/23/2014 1004   NITRITE NEGATIVE 02/23/2014 1004   LEUKOCYTESUR NEGATIVE 02/23/2014 1004     STUDIES: Echo results reviewed  ELIGIBLE FOR AVAILABLE RESEARCH PROTOCOL: no  ASSESSMENT: 52 y.o. [place], [place] woman status post left breast overlapping sites biopsy 03/02/2017 for a clinical T3 N1-2, stage IIIA invasive ductal carcinoma, grade 3, estrogen and progesterone receptor negative, HER-2 amplified, with an MIB-1 of 35%  (1) genetics testing07/24/2018 through the Hereditary Gene Panel offered by [facility] found no deleterious mutations in APC, ATM, AXIN2, BARD1, BMPR1A, BRCA1, BRCA2, BRIP1, CDH1, CDKN2A (p14ARF), CDKN2A (p16INK4a), CHEK2, CTNNA1, DICER1, EPCAM (Deletion/duplication testing only), GREM1 (promoter region deletion/duplication testing only), KIT, MEN1, MLH1, MSH2, MSH3, MSH6, MUTYH, NBN, NF1, NHTL1, PALB2, PDGFRA, PMS2, POLD1, POLE, PTEN,  RAD50, RAD51C, RAD51D, SDHB, SDHC, SDHD, SMAD4, SMARCA4. STK11, TP53, TSC1, TSC2, and VHL.  The following genes were evaluated for sequence  changes only: SDHA and HOXB13 c.251G>A variant only.  (2) neoadjuvant chemotherapy consisting of carboplatin and Docetaxel given with trastuzumab and Pertuzumab every 21 days for 6 cycles, started 03/31/2017, completed 07/14/2017  (a) Pertuzumab stopped after cycle 1 secondary to side effects     (b) Pertuzumab resumed cycle 3, not at loading dose.  (3) trastuzumab continued to complete 6 months (final dose 09/22/2017)  (a) echocardiogram 03/20/2017 showed an ejection fraction of 55%  (b) echocardiogram 06/30/2017 shows an ejection fraction of 55%  (c) echocardiogram 09/30/2017 shows an ejection fraction of 55-60% range   (4) status post left lumpectomy and axillary lymph node dissection 08/19/2017 for a residual pT1a pN0 invasive ductal carcinoma, grade 2, with negative margins, repeat prognostic panel again estrogen and progesterone receptor negative, but HER-2 amplified  (5) adjuvant radiation started 09/23/2017--to be completed 11/05/2017   PLAN: Kelita is tolerating her radiation well.  She understands she may expect some fatigue towards the end and that that likely will continue for several weeks after completing her radiation treatments.  The best way to move beyond that is exercise and we discussed that at length today.  It is favorable that she has a step counter as that is a great motivator.  Her hair and sense of taste are both coming back at the same time.  I suspect that this time I see her again in March she will be largely recovered.  That is it phase however during which people do begin to review the recent past and can have posttraumatic stress symptoms.  I strongly encouraged her to consider the finding you are near normal group.  She is planning to sign up.  Otherwise she will return to see me in March.  She knows to call for any other issues that may develop before then.   Chauncey Cruel, MD 10/14/17 1:24 PM Medical Oncology and Hematology [facility] [address] [place], [place] [address] Tel. [phone] Fax. [phone]  This document serves as a record of services personally performed by Lurline Del, MD. It was created on his behalf by Sheron Nightingale, a trained medical scribe. The creation of this record is based on the scribe's personal observations and the provider's statements to them.   I have reviewed the above documentation for accuracy and completeness, and I agree with the above.

## 2017-10-15 ENCOUNTER — Ambulatory Visit
Admission: RE | Admit: 2017-10-15 | Discharge: 2017-10-15 | Disposition: A | Discharge status: Home / Self Care | Payer: 59 | Source: Ambulatory Visit | Attending: Radiation Oncology | Admitting: Radiation Oncology

## 2017-10-15 DIAGNOSIS — Z510 Encounter for antineoplastic radiation therapy: Secondary | ICD-10-CM | POA: Diagnosis not present

## 2017-10-16 ENCOUNTER — Ambulatory Visit
Admission: RE | Admit: 2017-10-16 | Discharge: 2017-10-16 | Disposition: A | Discharge status: Home / Self Care | Payer: 59 | Source: Ambulatory Visit | Attending: Radiation Oncology | Admitting: Radiation Oncology

## 2017-10-16 DIAGNOSIS — Z510 Encounter for antineoplastic radiation therapy: Secondary | ICD-10-CM | POA: Diagnosis not present

## 2017-10-19 ENCOUNTER — Ambulatory Visit
Admission: RE | Admit: 2017-10-19 | Discharge: 2017-10-19 | Disposition: A | Discharge status: Home / Self Care | Payer: 59 | Source: Ambulatory Visit | Attending: Radiation Oncology | Admitting: Radiation Oncology

## 2017-10-19 DIAGNOSIS — Z510 Encounter for antineoplastic radiation therapy: Secondary | ICD-10-CM | POA: Diagnosis not present

## 2017-10-20 ENCOUNTER — Ambulatory Visit
Admission: RE | Admit: 2017-10-20 | Discharge: 2017-10-20 | Disposition: A | Discharge status: Home / Self Care | Payer: 59 | Source: Ambulatory Visit | Attending: Radiation Oncology | Admitting: Radiation Oncology

## 2017-10-20 ENCOUNTER — Telehealth: Payer: Self-pay | Admitting: Oncology

## 2017-10-20 DIAGNOSIS — Z510 Encounter for antineoplastic radiation therapy: Secondary | ICD-10-CM | POA: Diagnosis not present

## 2017-10-20 NOTE — Telephone Encounter (Signed)
No 1/8 los.  

## 2017-10-21 ENCOUNTER — Ambulatory Visit
Admission: RE | Admit: 2017-10-21 | Discharge: 2017-10-21 | Disposition: A | Discharge status: Home / Self Care | Payer: 59 | Source: Ambulatory Visit | Attending: Radiation Oncology | Admitting: Radiation Oncology

## 2017-10-21 DIAGNOSIS — Z510 Encounter for antineoplastic radiation therapy: Secondary | ICD-10-CM | POA: Diagnosis not present

## 2017-10-22 ENCOUNTER — Ambulatory Visit
Admission: RE | Admit: 2017-10-22 | Discharge: 2017-10-22 | Disposition: A | Discharge status: Home / Self Care | Payer: 59 | Source: Ambulatory Visit | Attending: Radiation Oncology | Admitting: Radiation Oncology

## 2017-10-22 DIAGNOSIS — Z510 Encounter for antineoplastic radiation therapy: Secondary | ICD-10-CM | POA: Diagnosis not present

## 2017-10-23 ENCOUNTER — Ambulatory Visit
Admission: RE | Admit: 2017-10-23 | Discharge: 2017-10-23 | Disposition: A | Discharge status: Home / Self Care | Payer: 59 | Source: Ambulatory Visit | Attending: Radiation Oncology | Admitting: Radiation Oncology

## 2017-10-23 DIAGNOSIS — Z510 Encounter for antineoplastic radiation therapy: Secondary | ICD-10-CM | POA: Diagnosis not present

## 2017-10-26 ENCOUNTER — Ambulatory Visit
Admission: RE | Admit: 2017-10-26 | Discharge: 2017-10-26 | Disposition: A | Discharge status: Home / Self Care | Payer: 59 | Source: Ambulatory Visit | Attending: Radiation Oncology | Admitting: Radiation Oncology

## 2017-10-26 ENCOUNTER — Ambulatory Visit: Payer: 59 | Admitting: Radiation Oncology

## 2017-10-26 DIAGNOSIS — Z510 Encounter for antineoplastic radiation therapy: Secondary | ICD-10-CM | POA: Diagnosis not present

## 2017-10-27 ENCOUNTER — Ambulatory Visit
Admission: RE | Admit: 2017-10-27 | Discharge: 2017-10-27 | Disposition: A | Discharge status: Home / Self Care | Payer: 59 | Source: Ambulatory Visit | Attending: Radiation Oncology | Admitting: Radiation Oncology

## 2017-10-27 DIAGNOSIS — Z510 Encounter for antineoplastic radiation therapy: Secondary | ICD-10-CM | POA: Diagnosis not present

## 2017-10-28 ENCOUNTER — Ambulatory Visit
Admission: RE | Admit: 2017-10-28 | Discharge: 2017-10-28 | Disposition: A | Discharge status: Home / Self Care | Payer: 59 | Source: Ambulatory Visit | Attending: Radiation Oncology | Admitting: Radiation Oncology

## 2017-10-28 DIAGNOSIS — Z510 Encounter for antineoplastic radiation therapy: Secondary | ICD-10-CM | POA: Diagnosis not present

## 2017-10-29 ENCOUNTER — Ambulatory Visit
Admission: RE | Admit: 2017-10-29 | Discharge: 2017-10-29 | Disposition: A | Discharge status: Home / Self Care | Payer: 59 | Source: Ambulatory Visit | Attending: Radiation Oncology | Admitting: Radiation Oncology

## 2017-10-29 DIAGNOSIS — Z510 Encounter for antineoplastic radiation therapy: Secondary | ICD-10-CM | POA: Diagnosis not present

## 2017-10-30 ENCOUNTER — Ambulatory Visit
Admission: RE | Admit: 2017-10-30 | Discharge: 2017-10-30 | Disposition: A | Discharge status: Home / Self Care | Payer: 59 | Source: Ambulatory Visit | Attending: Radiation Oncology | Admitting: Radiation Oncology

## 2017-10-30 DIAGNOSIS — Z510 Encounter for antineoplastic radiation therapy: Secondary | ICD-10-CM | POA: Diagnosis not present

## 2017-11-02 ENCOUNTER — Other Ambulatory Visit: Payer: Self-pay | Admitting: *Deleted

## 2017-11-02 ENCOUNTER — Ambulatory Visit
Admission: RE | Admit: 2017-11-02 | Discharge: 2017-11-02 | Disposition: A | Discharge status: Home / Self Care | Payer: 59 | Source: Ambulatory Visit | Attending: Radiation Oncology | Admitting: Radiation Oncology

## 2017-11-02 DIAGNOSIS — Z510 Encounter for antineoplastic radiation therapy: Secondary | ICD-10-CM | POA: Diagnosis not present

## 2017-11-02 DIAGNOSIS — C50812 Malignant neoplasm of overlapping sites of left female breast: Secondary | ICD-10-CM

## 2017-11-02 DIAGNOSIS — Z171 Estrogen receptor negative status [ER-]: Principal | ICD-10-CM

## 2017-11-02 MED ORDER — RADIAPLEXRX EX GEL
Freq: Once | CUTANEOUS | Status: AC
  Administered 2017-11-02: 12:00:00 via TOPICAL

## 2017-11-02 MED ORDER — ESOMEPRAZOLE MAGNESIUM 40 MG PO CPDR: 40.0000 mg | DELAYED_RELEASE_CAPSULE | Freq: Two times a day (BID) | ORAL | 3 refills | Status: DC

## 2017-11-03 ENCOUNTER — Ambulatory Visit: Payer: 59

## 2017-11-03 ENCOUNTER — Ambulatory Visit
Admission: RE | Admit: 2017-11-03 | Discharge: 2017-11-03 | Disposition: A | Discharge status: Home / Self Care | Payer: 59 | Source: Ambulatory Visit | Attending: Radiation Oncology | Admitting: Radiation Oncology

## 2017-11-03 DIAGNOSIS — Z510 Encounter for antineoplastic radiation therapy: Secondary | ICD-10-CM | POA: Diagnosis not present

## 2017-11-04 ENCOUNTER — Ambulatory Visit
Admission: RE | Admit: 2017-11-04 | Discharge: 2017-11-04 | Disposition: A | Discharge status: Home / Self Care | Payer: 59 | Source: Ambulatory Visit | Attending: Radiation Oncology | Admitting: Radiation Oncology

## 2017-11-04 ENCOUNTER — Ambulatory Visit: Payer: 59

## 2017-11-04 DIAGNOSIS — Z510 Encounter for antineoplastic radiation therapy: Secondary | ICD-10-CM | POA: Diagnosis not present

## 2017-11-05 ENCOUNTER — Ambulatory Visit
Admission: RE | Admit: 2017-11-05 | Discharge: 2017-11-05 | Disposition: A | Discharge status: Home / Self Care | Payer: 59 | Source: Ambulatory Visit | Attending: Radiation Oncology | Admitting: Radiation Oncology

## 2017-11-05 DIAGNOSIS — Z510 Encounter for antineoplastic radiation therapy: Secondary | ICD-10-CM | POA: Diagnosis not present

## 2017-11-10 ENCOUNTER — Encounter: Payer: Self-pay | Admitting: Radiation Oncology

## 2017-11-10 NOTE — Progress Notes (Signed)
  Radiation Oncology         [phone] ________________________________  Name: Maria Hester MRN: [id number]  Date: 11/10/2017  DOB: 09/19/1966  End of Treatment Note  Diagnosis:   StageIIIA, Pathologic Stage ypT1a ypN0 LeftBreast  Clinical [id number] Invasive Ductal Carcinoma, ER-/ PR-/ Her2+, Grade3     Indication for treatment:  Curative       Radiation treatment dates:   09/22/17 - 11/05/17  Site/dose:   Left breast treated to 50 Gy with 25 fx of 2 Gy            Left supraclavicular region treated to 45 Gy with 25 fx of 1.8 Gy           Left breast boost of 10 Gy with 5 fx of 2 Gy  Beams/energy:   1) 3D/ 10X 2) 3D / 15X, 6X 3) photons / 6X and 10X  Narrative: The patient tolerated radiation treatment relatively well. She endorsed irritation to her left clavicle area, upper back and left breast during radiation treatment. She endorsed use of radiaplex and neosporin to dry, peeling areas present on left clavicle and under left breast.  Plan: The patient has completed radiation treatment. The patient will return to radiation oncology clinic for routine followup in one month. I advised them to call or return sooner if they have any questions or concerns related to their recovery or treatment.  -----------------------------------  Eppie Gibson, MD  This document serves as a record of services personally performed by Eppie Gibson, MD. It was created on his behalf by Linward Natal, a trained medical scribe. The creation of this record is based on the scribe's personal observations and the provider's statements to them. This document has been checked and approved by the attending provider.

## 2017-12-04 ENCOUNTER — Ambulatory Visit
Admission: RE | Admit: 2017-12-04 | Discharge: 2017-12-04 | Disposition: A | Discharge status: Home / Self Care | Payer: 59 | Source: Ambulatory Visit | Attending: Radiation Oncology | Admitting: Radiation Oncology

## 2017-12-04 ENCOUNTER — Encounter: Payer: Self-pay | Admitting: Radiation Oncology

## 2017-12-04 VITALS — BP 114/64 | HR 66 | Temp 98.0°F | Ht 65.0 in | Wt 189.2 lb

## 2017-12-04 DIAGNOSIS — C50112 Malignant neoplasm of central portion of left female breast: Secondary | ICD-10-CM

## 2017-12-04 DIAGNOSIS — C50812 Malignant neoplasm of overlapping sites of left female breast: Secondary | ICD-10-CM | POA: Insufficient documentation

## 2017-12-04 DIAGNOSIS — Z171 Estrogen receptor negative status [ER-]: Secondary | ICD-10-CM | POA: Insufficient documentation

## 2017-12-04 DIAGNOSIS — Z79899 Other long term (current) drug therapy: Secondary | ICD-10-CM | POA: Insufficient documentation

## 2017-12-04 DIAGNOSIS — Z923 Personal history of irradiation: Secondary | ICD-10-CM | POA: Insufficient documentation

## 2017-12-04 NOTE — Progress Notes (Signed)
Radiation Oncology         [phone] ________________________________  Name: Maria Hester MRN: [id number]  Date: 12/04/2017  DOB: 1965-10-29  Follow-Up Visit Note  Outpatient  CC: [name], MD  Magrinat, Virgie Dad, MD  Diagnosis and Prior Radiotherapy:    ICD-10-CM   1. Malignant neoplasm of overlapping sites of left breast in female, estrogen receptor negative ([place]) C50.812    Z17.1   2. Malignant neoplasm of central portion of left breast in female, estrogen receptor negative ([place]) C50.112    Z17.1     StageIIIA, Clinical StageT3N1M0, Pathologic StageypT1a ypN0LeftBreastUOQ Invasive Ductal Carcinoma, ER(-)/ PR(-)/ Her2(+), Grade3 Radiation treatment dates:   09/22/2017 - 11/05/2017 Site/dose:   1. Left Breast / 50 Gy in 25 fractions 2. Left Supraclavicular Region / 45 Gy in 25 fractions 3. Left Breast Boost / 10 Gy in 5 fractions  CHIEF COMPLAINT: Here for follow-up and surveillance of left breast cancer  Narrative:  The patient returns today for routine follow-up of radiation completed one month ago to her left breast.   Doing well.                               ALLERGIES:  has No Known Allergies.  Meds: Current Outpatient Medications  Medication Sig Dispense Refill  . ALPRAZolam (XANAX) 0.5 MG tablet TAKE HALF TO 1 TABLET BY MOUTH DAILY AS NEEDED FOR ANXIETY  2  . esomeprazole (NEXIUM) 40 MG capsule Take 1 capsule (40 mg total) by mouth 2 (two) times daily before a meal. 180 capsule 3  . rosuvastatin (CRESTOR) 10 MG tablet Take 10 mg by mouth every other day. At bedtime  0   No current facility-administered medications for this encounter.    Facility-Administered Medications Ordered in Other Encounters  Medication Dose Route Frequency Provider Last Rate Last Dose  . heparin lock flush 100 unit/mL  500 Units Intracatheter Once Magrinat, Virgie Dad, MD      . sodium chloride flush (NS) 0.9 % injection 10 mL  10 mL Intracatheter Once Magrinat, Virgie Dad, MD        Physical Findings: The patient is in no acute distress. Patient is alert and oriented.  height is '5\' 5"'$  (1.651 m) and weight is 189 lb 3.2 oz (85.8 kg). Her temperature is 98 F (36.7 C). Her blood pressure is 114/64 and her pulse is 66. Her oxygen saturation is 97%.   Satisfactory skin healing in radiotherapy fields.      Lab Findings: Lab Results  Component Value Date   WBC 5.5 10/14/2017   HGB 11.6 10/14/2017   HCT 36.2 10/14/2017   MCV 90.7 10/14/2017   PLT 275 10/14/2017    Radiographic Findings: No results found.  Impression/Plan: Healing well from radiotherapy to the breast tissue.  Continue skin care with topical Vitamin E  lotion for at least 2 more months for further healing.  I encouraged her to continue with yearly mammography as appropriate (for intact breast tissue) and followup with medical oncology. I will see her back on an as-needed basis. I have encouraged her to call if she has any issues or concerns in the future. I wished her the very best.   _____________________________________   Eppie Gibson, MD  This document serves as a record of services personally performed by Eppie Gibson, MD. It was created on her behalf by Rae Lips, a trained medical scribe. The creation  of this record is based on the scribe's personal observations and the provider's statements to them. This document has been checked and approved by the attending provider.

## 2017-12-04 NOTE — Progress Notes (Signed)
Ms. Boice presents for follow up of radiation completed 11/05/17 to her Left Breast. She denies pain. She has some mild fatigue. She has healed well from radiation with only slight redness present. She is using Radiaplex currently to her Left Breast. She will switch to a vitamin E lotion when the radiaplex is completed. She saw Dr. Jana Hakim last on 10/14/17 and again on 12/14/17.   BP 114/64   Pulse 66   Temp 98 F (36.7 C)   Ht 5\' 5"  (1.651 m)   Wt 189 lb 3.2 oz (85.8 kg)   LMP 02/11/2014   SpO2 97% Comment: room air  BMI 31.48 kg/m    Wt Readings from Last 3 Encounters:  12/04/17 189 lb 3.2 oz (85.8 kg)  10/14/17 199 lb (90.3 kg)  09/30/17 198 lb 6.4 oz (90 kg)

## 2017-12-14 ENCOUNTER — Other Ambulatory Visit: Payer: 59

## 2017-12-14 ENCOUNTER — Ambulatory Visit: Payer: 59 | Admitting: Oncology

## 2017-12-22 NOTE — Progress Notes (Signed)
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 She is accompanied by her husband.  She completed her radiation treatments in late January of this year.  REVIEW OF SYSTEMS: Maria Hester reports moderate hot flashes and feeling anxious at times. She states when she begins feeling anxious or upset she will feel pins and needles. Adding that she has felt them all over her body, but not at the same time. When exercising she will also get pins and needs in her feet. In time after moving her feet around they will go away. If she is sitting for a long period of time she becomes very stiff, and will "wobble" around. At work she does not have any issues with being able to focus. She walks every day and tries to get 4000 steps a day. She is hoping to work up to 10000 steps a day. Of note, she stopped doing the exercises for her left arm, but recently started again because her arm started feeling tight. She denies unusual headaches, visual changes, nausea, vomiting, or dizziness. There has been no unusual cough, phlegm production, or pleurisy. This been no change in bowel or bladder habits. She denies unexplained fatigue or unexplained weight loss, bleeding, rash, or fever. A detailed review of systems was otherwise noncontributory.     BREAST  CANCER HISTORY: From the original intake note:  Maria Hester herself noted a change in her left breast when looking into a mirror and then palpating a mass in the upper-outer quadrant. She brought this to medical attention and on 02/24/2017 underwent bilateral diagnostic mammography with tomography and left breast ultrasonography at the breast Center. The breast density was category C. In the upper portion of the left breast there was a broad area of distortion measuring up to 7.5 cm. On exam there was a broad firm area involving the upper outer quadrant of the left breast which was visibly protruding. By ultrasound at the 11:00 axis 8 cm from the nipple there was an irregular hypoechoic mass measuring 1.4 cm with additiona ases at 2:00, 3 cm from the nipple and multiple other masses as described, all in the upper-outer quadrant primarily. Ultrasound of the left axilla showed at least 3 prominent lymph nodes one of which had a cortical bulge.  On 03/02/2017 the patient underwent biopsy of a left breast mass at the 11:30 o'clock position and a second mass described as upper outer quadrant as well as one of the suspicious left axillary lymph nodes. These all showed invasive ductal carcinoma, grade 3. Prognostic panel from one of the 2 masses showed the tumor to be estrogen and progesterone receptor negative, but HER-2 amplified, with a signals ratio of 8.24 and the number per cell 15.65.  Her subsequent history is as detailed below   PAST MEDICAL HISTORY: Past Medical History:  Diagnosis Date  . Breast cancer ([place])   . 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 Cataracts, bilateral   . Complication of anesthesia    blood pressure very low after surgery difficult to wake -1st surgery only-when pt was 52 y.o.  . Family history of breast cancer   . Gallbladder disease    "sludge in gallbladder",   . History of hiatal hernia   . Hyperlipidemia   .  Uterine fibroid     PAST SURGICAL HISTORY: Past Surgical History:  Procedure Laterality Date  . ABDOMINAL HYSTERECTOMY    . ABDOMINOPLASTY N/A 02/28/2014   Procedure: ABDOMINOPLASTY with Panniculectomy;  Surgeon: [name], MD;  Location: [facility];  Service: Gynecology;  Laterality: N/A;  . APPENDECTOMY    . BILATERAL SALPINGECTOMY Bilateral 02/28/2014   Procedure: BILATERAL SALPINGECTOMY;  Surgeon: [name], MD;  Location: [facility];  Service: Gynecology;  Laterality: Bilateral;  . BREAST LUMPECTOMY WITH RADIOACTIVE SEED AND SENTINEL LYMPH NODE BIOPSY Left 08/19/2017   Procedure: LEFT BREAST LUMPECTOMY WITH RADIOACTIVE SEED AND LEFT SENTINEL LYMPH NODE BIOPSY;  Surgeon: Excell Seltzer, MD;  Location: [facility];  Service: General;  Laterality: Left;  . CATARACT EXTRACTION, BILATERAL    . CHOLECYSTECTOMY N/A 08/19/2017   Procedure: LAPAROSCOPIC CHOLECYSTECTOMY WITH INTRAOPERATIVE CHOLANGIOGRAM;  Surgeon: Excell Seltzer, MD;  Location: [facility];  Service: General;  Laterality: N/A;  . PORTACATH PLACEMENT Right 03/18/2017   Procedure: INSERTION PORT-A-CATH;  Surgeon: Excell Seltzer, MD;  Location: [facility];  Service: General;  Laterality: Right;  . reverse tubal ligation    . SCAR REVISION N/A 02/28/2014   Procedure: SCAR REVISION;  Surgeon: [name], MD;  Location: [facility];  Service: Gynecology;  Laterality: N/A;  . SUPRACERVICAL ABDOMINAL HYSTERECTOMY N/A 02/28/2014   Procedure: HYSTERECTOMY SUPRACERVICAL ABDOMINAL;  Surgeon: [name], MD;  Location: [facility];  Service: Gynecology;  Laterality: N/A;  . TUBAL LIGATION    . WISDOM TOOTH EXTRACTION      FAMILY HISTORY Family History  Problem Relation Age of Onset  . Heart disease Mother   . Hypertension Mother   . Diabetes Father   . Heart disease Father   . Heart disease Maternal Grandmother   . Colon cancer Maternal Grandmother 77       died in her 60's  . Heart disease Maternal Grandfather   . Esophageal cancer  Maternal Grandfather 50       died at 39  . Diabetes Paternal Grandmother   . Heart disease Paternal Grandmother   . Kidney cancer Paternal Grandmother 30       died in her 36's  . Tuberculosis Paternal Grandfather   . Heart disease Paternal Grandfather   . Skin cancer Paternal Grandfather 2       died at 60, patient not sure if it was melanoma, BCC, Squamous, etc.  . Breast cancer Maternal Aunt 50       died at 50  . Breast cancer Cousin 51       she is now in her 65's  . Rectal cancer Neg Hx   . Stomach cancer Neg Hx   The patient's father died from heart disease at age 90. The patient's mother is living at age 27. The patient has one brother, 2 sisters. On the mother's side and aunt had breast cancer at age 9 and her daughter had breast cancer in her early 86s. There is also a grandfather with esophageal cancer and a grandmother with colon cancer. On the paternal side there is a grandmother with  kidney cancer at an early age and a grandfather with melanoma at an early age.  GYNECOLOGIC HISTORY:  Patient's last menstrual period was 02/11/2014.  Menarche age 5, first live birth age 87, the patient is [place] P2. She underwent hysterectomy without salpingo-oophorectomy May 2016. She did not use hormone replacement. She did use oral contraceptives approximately 10 years in the 1980s.  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 Her husband Fish farm manager in the same [place], which is a 10% aviation enrollment. Daughter Maria Hester is a hairstylist in [place] and son Maria Hester is a Physiological scientist in [place] the patient has 2 grandsons and one granddaughter. She attends a [employer]    ADVANCED DIRECTIVES: The patient has completed advanced directives and will have the motorized at the next visit   HEALTH MAINTENANCE: Social History   Tobacco Use  . Smoking status: Former Smoker    Packs/day: 0.50     Years: 34.00    Pack years: 17.00    Types: Cigarettes    Last attempt to quit: 10/13/2013    Years since quitting: 4.1  . Smokeless tobacco: Never Used  Substance Use Topics  . Alcohol use: No    Comment: social drink   . Drug use: No     Colonoscopy:No  PAP: 03/04/2017  Bone density: No   No Known Allergies  Current Outpatient Medications  Medication Sig Dispense Refill  . ALPRAZolam (XANAX) 0.5 MG tablet TAKE HALF TO 1 TABLET BY MOUTH DAILY AS NEEDED FOR ANXIETY  2  . esomeprazole (NEXIUM) 40 MG capsule Take 1 capsule (40 mg total) by mouth 2 (two) times daily before a meal. 180 capsule 3  . rosuvastatin (CRESTOR) 10 MG tablet Take 10 mg by mouth every other day. At bedtime  0   No current facility-administered medications for this visit.    Facility-Administered Medications Ordered in Other Visits  Medication Dose Route Frequency Provider Last Rate Last Dose  . heparin lock flush 100 unit/mL  500 Units Intracatheter Once Magrinat, Virgie Dad, MD      . sodium chloride flush (NS) 0.9 % injection 10 mL  10 mL Intracatheter Once Magrinat, Virgie Dad, MD        OBJECTIVE:Middle-aged white woman in no acute distress  Vitals:   12/23/17 0901  BP: 96/84  Pulse: 80  Resp: 18  Temp: 97.9 F (36.6 C)  SpO2: 99%     Body mass index is 31.65 kg/m.    ECOG FS:1 - Symptomatic but completely ambulatory  Sclerae unicteric, EOMs intact No cervical or supraclavicular adenopathy Lungs no rales or rhonchi Heart regular rate and rhythm Abd soft, nontender, positive bowel sounds MSK no focal spinal tenderness, no upper extremity lymphedema Neuro: nonfocal, well oriented, appropriate affect Breasts: The right breast is benign.  The left breast is undergone lumpectomy and radiation.  The cosmetic result is excellent.  There is no evidence of residual or recurrent disease.  Both axillae are benign.    LAB RESULTS:  CMP     Component Value Date/Time   NA 139 10/14/2017 1159   K 4.3  10/14/2017 1159   CL 108 08/13/2017 0913   CO2 25 10/14/2017 1159   GLUCOSE 92 10/14/2017 1159   BUN 15.9 10/14/2017 1159   CREATININE 0.7 10/14/2017 1159   CALCIUM 9.3 10/14/2017 1159   PROT 6.8 10/14/2017 1159   ALBUMIN 3.6 10/14/2017 1159   AST 20 10/14/2017  1159   ALT 23 10/14/2017 1159   ALKPHOS 83 10/14/2017 1159   BILITOT 0.23 10/14/2017 1159   GFRNONAA >60 08/13/2017 0913   GFRAA >60 08/13/2017 0913    No results found for: Ronnald Ramp, A1GS, A2GS, BETS, BETA2SER, GAMS, MSPIKE, SPEI  No results found for: Nils Pyle, [facility]  Lab Results  Component Value Date   WBC 4.7 12/23/2017   NEUTROABS 2.7 12/23/2017   HGB 12.0 12/23/2017   HCT 36.7 12/23/2017   MCV 86.8 12/23/2017   PLT 256 12/23/2017      Chemistry      Component Value Date/Time   NA 139 10/14/2017 1159   K 4.3 10/14/2017 1159   CL 108 08/13/2017 0913   CO2 25 10/14/2017 1159   BUN 15.9 10/14/2017 1159   CREATININE 0.7 10/14/2017 1159      Component Value Date/Time   CALCIUM 9.3 10/14/2017 1159   ALKPHOS 83 10/14/2017 1159   AST 20 10/14/2017 1159   ALT 23 10/14/2017 1159   BILITOT 0.23 10/14/2017 1159       No results found for: LABCA2  No components found for: [id number]  No results for input(s): INR in the last 168 hours.  Urinalysis    Component Value Date/Time   COLORURINE YELLOW 02/23/2014 1004   APPEARANCEUR CLEAR 02/23/2014 1004   LABSPEC 1.025 02/23/2014 1004   PHURINE 5.5 02/23/2014 1004   GLUCOSEU NEGATIVE 02/23/2014 1004   HGBUR NEGATIVE 02/23/2014 1004   BILIRUBINUR NEGATIVE 02/23/2014 1004   KETONESUR NEGATIVE 02/23/2014 1004   PROTEINUR NEGATIVE 02/23/2014 1004   UROBILINOGEN 0.2 02/23/2014 1004   NITRITE NEGATIVE 02/23/2014 1004   LEUKOCYTESUR NEGATIVE 02/23/2014 1004     STUDIES: Repeat mammography is due second half of May at the present  [place]: no  ASSESSMENT: 52 y.o. [place], Onset woman  status post left breast overlapping sites biopsy 03/02/2017 for a clinical T3 N1-2, stage IIIA invasive ductal carcinoma, grade 3, estrogen and progesterone receptor negative, HER-2 amplified, with an MIB-1 of 35%  (1) genetics testing07/24/2018 through the Hereditary Gene Panel offered by [facility] found no deleterious mutations in APC, ATM, AXIN2, BARD1, BMPR1A, BRCA1, BRCA2, BRIP1, CDH1, CDKN2A (p14ARF), CDKN2A (p16INK4a), CHEK2, CTNNA1, DICER1, EPCAM (Deletion/duplication testing only), GREM1 (promoter region deletion/duplication testing only), KIT, MEN1, MLH1, MSH2, MSH3, MSH6, MUTYH, NBN, NF1, NHTL1, PALB2, PDGFRA, PMS2, POLD1, POLE, PTEN, RAD50, RAD51C, RAD51D, SDHB, SDHC, SDHD, SMAD4, SMARCA4. STK11, TP53, TSC1, TSC2, and VHL.  The following genes were evaluated for sequence changes only: SDHA and HOXB13 c.251G>A variant only.  (2) neoadjuvant chemotherapy consisting of carboplatin and Docetaxel given with trastuzumab and Pertuzumab every 21 days for 6 cycles, started 03/31/2017, completed 07/14/2017  (a) Pertuzumab stopped after cycle 1 secondary to side effects     (b) Pertuzumab resumed cycle 3, not at loading dose.  (3) trastuzumab continued to complete 6 months (final dose 09/22/2017)  (a) echocardiogram 03/20/2017 showed an ejection fraction of 55%  (b) echocardiogram 06/30/2017 shows an ejection fraction of 55%  (c) echocardiogram 09/30/2017 shows an ejection fraction of 55-60% range   (4) status post left lumpectomy and axillary lymph node dissection 08/19/2017 for a residual pT1a pN0 invasive ductal carcinoma, grade 2, with negative margins, repeat prognostic panel again estrogen and progesterone receptor negative, but HER-2 amplified  (5) adjuvant radiation 09/22/17 - 11/05/17 Site/dose:   Left breast treated to 50 Gy with 25 fx of 2 Gy  Left supraclavicular region treated to 45 Gy with 25 fx of 1.8 Gy                      Left breast boost of 10 Gy with 5 fx  of 2 Gy    PLAN: Maria Hester is now 4 months out from definitive surgery for her breast cancer and she has completed all her treatments.  We are beginning the observation stage, which will take [place] 5 years  She is having problems in the left upper extremity with range of motion.  Our physical therapist today gave her a review sheet of the exercises that she needs to be doing and also her phone number in case things do not improve.  I will be glad to put in a referral for her to physical therapy if Caraline decides to try that  I think she would benefit from tai chi and I explained how she can access those types  As far as the hot flashes are concerned we discussed venlafaxine.  After much discussion she decided she is going to try to whether the hot flashes on her own  I do not have a simple explanation for the pins and needles that she occasionally has.  Possibly this is due to skin dryness secondary to chemotherapy.  If so it should resolve over the next several months  As far as her arthritis symptoms are concerned I do not know whether they could possibly be due to her Crestor.  A simple thing for her to try is to get off the Crestor for 6 weeks and see if things improve.  Otherwise the more she exercises the better she will feel.  I have put her in for repeat mammography in May.  She will see her gynecologist around that time.  She will see me in August.  She will see her primary care physician in January  She knows to call for any other issues that may develop before the next visit.   Chauncey Cruel, MD 12/23/17 9:28 AM Medical Oncology and Hematology [facility] [address] [place], [place] [address] Tel. [phone] Fax. [phone]  This document serves as a record of services personally performed by Sullivan Lone, MD. It was created on his behalf by Margit Banda, a trained medical scribe. The creation of this record is based on the scribe's personal  observations and the provider's statements to them.   I have reviewed the above documentation for accuracy and completeness, and I agree with the above.

## 2017-12-23 ENCOUNTER — Inpatient Hospital Stay: Payer: 59

## 2017-12-23 ENCOUNTER — Telehealth: Payer: Self-pay | Admitting: Hematology and Oncology

## 2017-12-23 ENCOUNTER — Inpatient Hospital Stay: Payer: 59 | Attending: Oncology

## 2017-12-23 ENCOUNTER — Inpatient Hospital Stay (HOSPITAL_BASED_OUTPATIENT_CLINIC_OR_DEPARTMENT_OTHER): Payer: 59 | Admitting: Oncology

## 2017-12-23 VITALS — BP 96/84 | HR 80 | Temp 97.9°F | Resp 18 | Ht 65.0 in | Wt 190.2 lb

## 2017-12-23 DIAGNOSIS — Z171 Estrogen receptor negative status [ER-]: Secondary | ICD-10-CM

## 2017-12-23 DIAGNOSIS — Z87891 Personal history of nicotine dependence: Secondary | ICD-10-CM | POA: Insufficient documentation

## 2017-12-23 DIAGNOSIS — Z8051 Family history of malignant neoplasm of kidney: Secondary | ICD-10-CM | POA: Insufficient documentation

## 2017-12-23 DIAGNOSIS — Z803 Family history of malignant neoplasm of breast: Secondary | ICD-10-CM | POA: Diagnosis not present

## 2017-12-23 DIAGNOSIS — Z800 Family history of malignant neoplasm of digestive organs: Secondary | ICD-10-CM

## 2017-12-23 DIAGNOSIS — Z95828 Presence of other vascular implants and grafts: Secondary | ICD-10-CM

## 2017-12-23 DIAGNOSIS — Z923 Personal history of irradiation: Secondary | ICD-10-CM

## 2017-12-23 DIAGNOSIS — C50812 Malignant neoplasm of overlapping sites of left female breast: Secondary | ICD-10-CM | POA: Diagnosis present

## 2017-12-23 DIAGNOSIS — Z79899 Other long term (current) drug therapy: Secondary | ICD-10-CM | POA: Insufficient documentation

## 2017-12-23 LAB — CBC WITH DIFFERENTIAL/PLATELET
Basophils Absolute: 0 10*3/uL (ref 0.0–0.1)
Basophils Relative: 0 %
Eosinophils Absolute: 0.1 10*3/uL (ref 0.0–0.5)
Eosinophils Relative: 1 %
HEMATOCRIT: 36.7 % (ref 34.8–46.6)
HEMOGLOBIN: 12 g/dL (ref 11.6–15.9)
Lymphocytes Relative: 30 %
Lymphs Abs: 1.4 10*3/uL (ref 0.9–3.3)
MCH: 28.4 pg (ref 25.1–34.0)
MCHC: 32.7 g/dL (ref 31.5–36.0)
MCV: 86.8 fL (ref 79.5–101.0)
MONOS PCT: 11 %
Monocytes Absolute: 0.5 10*3/uL (ref 0.1–0.9)
NEUTROS ABS: 2.7 10*3/uL (ref 1.5–6.5)
NEUTROS PCT: 58 %
Platelets: 256 10*3/uL (ref 145–400)
RBC: 4.23 MIL/uL (ref 3.70–5.45)
RDW: 14.2 % (ref 11.2–14.5)
WBC: 4.7 10*3/uL (ref 3.9–10.3)

## 2017-12-23 LAB — COMPREHENSIVE METABOLIC PANEL
ALK PHOS: 85 U/L (ref 40–150)
ALT: 21 U/L (ref 0–55)
ANION GAP: 7 (ref 3–11)
AST: 23 U/L (ref 5–34)
Albumin: 3.4 g/dL — ABNORMAL LOW (ref 3.5–5.0)
BILIRUBIN TOTAL: 0.4 mg/dL (ref 0.2–1.2)
BUN: 16 mg/dL (ref 7–26)
CALCIUM: 9.5 mg/dL (ref 8.4–10.4)
CO2: 29 mmol/L (ref 22–29)
CREATININE: 0.75 mg/dL (ref 0.60–1.10)
Chloride: 106 mmol/L (ref 98–109)
GFR calc Af Amer: >60 mL/min (ref >60)
GFR calc non Af Amer: >60 mL/min (ref >60)
GLUCOSE: 104 mg/dL (ref 70–140)
Potassium: 4.4 mmol/L (ref 3.5–5.1)
SODIUM: 142 mmol/L (ref 136–145)
Total Protein: 6.8 g/dL (ref 6.4–8.3)

## 2017-12-23 MED ORDER — HEPARIN SOD (PORK) LOCK FLUSH 100 UNIT/ML IV SOLN
500.0000 [IU] | Freq: Once | INTRAVENOUS | Status: DC
  Filled 2017-12-23: qty 5

## 2017-12-23 MED ORDER — SODIUM CHLORIDE 0.9% FLUSH
10.0000 mL | Freq: Once | INTRAVENOUS | Status: DC
  Filled 2017-12-23: qty 10

## 2017-12-23 NOTE — Telephone Encounter (Signed)
Patient declined avs and calendar of upcoming august appointments.

## 2017-12-28 ENCOUNTER — Telehealth: Payer: Self-pay | Admitting: *Deleted

## 2017-12-28 NOTE — Telephone Encounter (Signed)
"  I've finished chemotherapy and radiation.  Can I receive dental x-rays?"  Informed her that yes she may have dental cleaning with x-rays at this time.  Denies further needs or questions at this time.

## 2018-02-02 ENCOUNTER — Telehealth: Payer: Self-pay

## 2018-02-02 NOTE — Telephone Encounter (Signed)
Received VM from pt reporting it's been  a while since she had finished her treatment and Dr Jana Hakim said if a referral for PT was needed for pain in her arm, just to call.  Called pt back, no answer, left a VM for pt to return call - let her know I needed her to describe her arm pain further, if there is any redness or swelling and left our contact info.

## 2018-02-03 ENCOUNTER — Telehealth: Payer: Self-pay | Admitting: Medical Oncology

## 2018-02-03 ENCOUNTER — Other Ambulatory Visit: Payer: Self-pay | Admitting: *Deleted

## 2018-02-03 DIAGNOSIS — Z171 Estrogen receptor negative status [ER-]: Principal | ICD-10-CM

## 2018-02-03 DIAGNOSIS — C50812 Malignant neoplasm of overlapping sites of left female breast: Secondary | ICD-10-CM

## 2018-02-03 NOTE — Telephone Encounter (Signed)
This RN spoke with pt - she would like to proceed with PT in [place]- referral placed by this RN.

## 2018-02-03 NOTE — Telephone Encounter (Signed)
PT-Request-Pt returning call . She has limited movement in her Left arm -cannot lift arm to put on deodorant. She can exercise it only in a hot shower ,but then shoulder freezes up and their is no option to move it. The pain is piercing through muscle into bone and from shoulder to wrist.

## 2018-02-16 ENCOUNTER — Ambulatory Visit: Payer: 59 | Attending: Oncology | Admitting: Rehabilitation

## 2018-02-16 ENCOUNTER — Encounter: Payer: Self-pay | Admitting: Rehabilitation

## 2018-02-16 ENCOUNTER — Other Ambulatory Visit: Payer: Self-pay

## 2018-02-16 DIAGNOSIS — M25512 Pain in left shoulder: Secondary | ICD-10-CM | POA: Diagnosis present

## 2018-02-16 DIAGNOSIS — Z483 Aftercare following surgery for neoplasm: Secondary | ICD-10-CM | POA: Diagnosis present

## 2018-02-16 DIAGNOSIS — M25612 Stiffness of left shoulder, not elsewhere classified: Secondary | ICD-10-CM | POA: Insufficient documentation

## 2018-02-16 DIAGNOSIS — R293 Abnormal posture: Secondary | ICD-10-CM | POA: Diagnosis present

## 2018-02-16 DIAGNOSIS — I890 Lymphedema, not elsewhere classified: Secondary | ICD-10-CM | POA: Diagnosis present

## 2018-02-16 NOTE — Patient Instructions (Signed)
Access Code: [email or web address]  URL: [email or web address]  Date: 02/16/2018  Prepared by: Shan Levans   Exercises  Supine Shoulder Flexion Extension AAROM with Dowel - 10 reps - 1 sets - 20 hold - 1x daily - 7x weekly  Supine Shoulder External Internal Rotation AAROM with Dowel - 10 reps - 1 sets - 20 hold - 1x daily - 7x weekly  Supine Shoulder External Rotation Stretch - 3 reps - 30 hold - 1x daily - 7x weekly  Seated Upper Trapezius Stretch - 3 reps - 30 hold - 3x daily - 7x weekly  Standing Shoulder Row - 10 reps - 3 sets - 1x daily - 7x weekly

## 2018-02-16 NOTE — Therapy (Signed)
[place], [place], [address] Phone: [phone]   Fax:  [phone]  Physical Therapy Evaluation  Patient Details  Name: JESIAH YERBY MRN: [id number] Date of Birth: 1966/01/27 Referring Provider: Lurline Del, MD   Encounter Date: 02/16/2018  PT End of Session - 02/16/18 0953    Visit Number  1    Number of Visits  16    Date for PT Re-Evaluation  04/13/18    PT Start Time  0845    PT Stop Time  0935    PT Time Calculation (min)  50 min    Activity Tolerance  Patient tolerated treatment well    Behavior During Therapy  [facility] for tasks assessed/performed       Past Medical History:  Diagnosis Date  . Breast cancer ([place])   . 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 Cataracts, bilateral   . Complication of anesthesia    blood pressure very low after surgery difficult to wake -1st surgery only-when pt was 52 y.o.  . Family history of breast cancer   . Gallbladder disease    "sludge in gallbladder",   . History of hiatal hernia   . Hyperlipidemia   . Uterine fibroid     Past Surgical History:  Procedure Laterality Date  . ABDOMINAL HYSTERECTOMY    . ABDOMINOPLASTY N/A 02/28/2014   Procedure: ABDOMINOPLASTY with Panniculectomy;  Surgeon: [name], MD;  Location: [facility];  Service: Gynecology;  Laterality: N/A;  . APPENDECTOMY    . BILATERAL SALPINGECTOMY Bilateral 02/28/2014   Procedure: BILATERAL SALPINGECTOMY;  Surgeon: [name], MD;  Location: [facility];  Service: Gynecology;  Laterality: Bilateral;  . BREAST LUMPECTOMY WITH RADIOACTIVE SEED AND SENTINEL LYMPH NODE BIOPSY Left 08/19/2017   Procedure: LEFT BREAST LUMPECTOMY WITH RADIOACTIVE SEED AND LEFT SENTINEL LYMPH NODE BIOPSY;  Surgeon: Excell Seltzer, MD;  Location: [place];  Service: General;  Laterality: Left;  . CATARACT EXTRACTION, BILATERAL    . CHOLECYSTECTOMY  N/A 08/19/2017   Procedure: LAPAROSCOPIC CHOLECYSTECTOMY WITH INTRAOPERATIVE CHOLANGIOGRAM;  Surgeon: Excell Seltzer, MD;  Location: [place];  Service: General;  Laterality: N/A;  . PORTACATH PLACEMENT Right 03/18/2017   Procedure: INSERTION PORT-A-CATH;  Surgeon: Excell Seltzer, MD;  Location: [facility];  Service: General;  Laterality: Right;  . reverse tubal ligation    . SCAR REVISION N/A 02/28/2014   Procedure: SCAR REVISION;  Surgeon: [name], MD;  Location: [facility];  Service: Gynecology;  Laterality: N/A;  . SUPRACERVICAL ABDOMINAL HYSTERECTOMY N/A 02/28/2014   Procedure: HYSTERECTOMY SUPRACERVICAL ABDOMINAL;  Surgeon: [name], MD;  Location[facility];  Service: Gynecology;  Laterality: N/A;  . TUBAL LIGATION    . WISDOM TOOTH EXTRACTION      There were no vitals filed for this visit.   Subjective Assessment - 02/16/18 0850    Subjective  I can't exercise this arm unless I'm in a hot shower due to shoulder pain.  Starting to get difficult to dress and do the bra.  Started a few weeks ago quickly. Also noting some Lt breast and UE intermittent swelling.  Every morning the breast is swollen and the arm. I have gained alot of weight in the last 6 months    Pertinent History  left lumpectomy and axillary lymph node dissection 08/19/2017 for a residual pT1a pN0 invasive ductal carcinoma, grade 2, with negative margins, repeat prognostic panel  again estrogen and progesterone receptor negative, but HER-2 amplified.  Radiation completed,  chemotherapy consisting of carboplatin and Docetaxel given with trastuzumab and Pertuzumab every 21 days for 6 cycles, started 03/31/2017, completed 07/14/2017, history of Rt pectoralis pull/tear at work 5 years ago that was still giving her trouble    Limitations  Lifting    Patient Stated Goals  improve the ROM of the Lt shoulder     Currently in Pain?  Yes    Pain Score  2     Pain Location  Arm mostly in the forearm    Pain Orientation  Left     Pain Descriptors / Indicators  Aching;Tightness    Pain Type  Acute pain    Aggravating Factors   reaching back and up, lifting     Pain Relieving Factors  a good resting position, hot shower    Effect of Pain on Daily Activities  limitations just using the Lt arm         [facility] PT Assessment - 02/16/18 0001      Assessment   Medical Diagnosis  Lt shoulder pain and stiffness    Referring Provider  Lurline Del, MD    Onset Date/Surgical Date  08/19/17    Hand Dominance  Right    Next MD Visit  unknown    Prior Therapy  no      Precautions   Precaution Comments  cancer      Restrictions   Weight Bearing Restrictions  No      Balance Screen   Has the patient fallen in the past 6 months  No    Has the patient had a decrease in activity level because of a fear of falling?   No    Is the patient reluctant to leave their home because of a fear of falling?   No      Home Environment   Living Environment  Private residence    Available Help at Discharge  Family      Prior Function   Level of Independence  Independent    Vocation  Full time employment      Cognition   Overall Cognitive Status  Within Functional Limits for tasks assessed      Observation/Other Assessments   Observations  well healed incisions      Sensation   Additional Comments  reports some numbness under the Lt upper arm from surgery      Posture/Postural Control   Posture/Postural Control  Postural limitations    Postural Limitations  Rounded Shoulders    Posture Comments  Rt Upper trap overactivity      ROM / Strength   AROM / PROM / Strength  AROM;PROM;Strength      AROM   Overall AROM Comments  all Lt painful at the shoulder;     AROM Assessment Site  Shoulder;Elbow    Right/Left Shoulder  Right;Left    Right Shoulder Flexion  165 Degrees    Right Shoulder ABduction  165 Degrees    Right Shoulder Internal Rotation  80 Degrees    Right Shoulder External Rotation  80 Degrees    Left  Shoulder Extension  40 Degrees    Left Shoulder Flexion  120 Degrees    Left Shoulder ABduction  90 Degrees    Left Shoulder Internal Rotation  35 Degrees    Left Shoulder External Rotation  40 Degrees    Left Shoulder Horizontal ABduction  40 Degrees not able to  lift to 90    Right/Left Elbow  Right;Left    Left Elbow Flexion  150    Left Elbow Extension  0      PROM   Overall PROM Comments  all painful Lt except IR to belly    PROM Assessment Site  Shoulder    Right/Left Shoulder  Right;Left    Left Shoulder Flexion  120 Degrees    Left Shoulder ABduction  90 Degrees    Left Shoulder Internal Rotation  70 Degrees    Left Shoulder External Rotation  30 Degrees    Left Shoulder Horizontal ABduction  20 Degrees unable to get to 90deg; pulling into breast and upper arm      Palpation   Palpation comment  +2 ttp Lt Upper trapezius        LYMPHEDEMA/ONCOLOGY QUESTIONNAIRE - 02/16/18 0925      Type   Cancer Type  Left breast cancer      Surgeries   Lumpectomy Date  08/19/17    Axillary Lymph Node Dissection Date  08/19/17    Number Lymph Nodes Removed  9      Date Lymphedema/Swelling Started   Date  08/19/17      Treatment   Active Chemotherapy Treatment  No    Past Chemotherapy Treatment  Yes    Date  03/31/17    Active Radiation Treatment  No    Past Radiation Treatment  Yes    Date  09/22/17    Body Site  Rt breast    Current Hormone Treatment  No    Past Hormone Therapy  No      What other symptoms do you have   Are you Having Heaviness or Tightness  Yes    Are you having Pain  Yes    Are you having pitting edema  No    Is it Hard or Difficult finding clothes that fit  No    Do you have infections  No    Is there Decreased scar mobility  No    [place] Sign  No      Lymphedema Assessments   Lymphedema Assessments  Upper extremities      Right Upper Extremity Lymphedema   10 cm Proximal to Olecranon Process  32.1 cm    Olecranon Process  29.5 cm    10 cm  Proximal to Ulnar Styloid Process  23.4 cm    Just Proximal to Ulnar Styloid Process  18 cm    Across Hand at [employer]  19.8 cm    At [place] of 2nd Digit  6.7 cm      Left Upper Extremity Lymphedema   10 cm Proximal to Olecranon Process  31.2 cm    Olecranon Process  28.5 cm    10 cm Proximal to Ulnar Styloid Process  21.2 cm    Just Proximal to Ulnar Styloid Process  18 cm    Across Hand at [employer]  18.7 cm    At [place] of 2nd Digit  6.5 cm             Objective measurements completed on examination: See above findings.              PT Education - 02/16/18 [phone]    Education provided  Yes    Education Details  POC, shoulder HEP per handout    Person(s) Educated  Patient    Methods  Explanation;Demonstration;Handout;Verbal cues    Comprehension  Verbalized understanding;Need further instruction          PT Long Term Goals - 02/16/18 1012      PT LONG TERM GOAL #1   Title  Pt will be aware of lymphedema risk reduction practices    Time  8    Period  Weeks    Status  New    Target Date  04/13/18      PT LONG TERM GOAL #2   Title  Pt will improve Lt shoulder AROM to; flexion 155, abduction 155, and ER 70 to demonstrate improved function    Time  8    Period  Weeks    Status  New    Target Date  04/13/18      PT LONG TERM GOAL #3   Title  Pt will be able to don her bra without limitations due to shoulder pain    Time  8    Period  Weeks    Status  New    Target Date  04/13/18      PT LONG TERM GOAL #4   Title  Pt will be independent with HEP for continued shoulder strength and ROM    Time  8    Period  Weeks    Status  New    Target Date  04/13/18      PT LONG TERM GOAL #5   Title  Pt will be knowledgeable about UE compression needs and how to obtain this    Time  8    Period  Weeks    Status  New    Target Date  04/13/18             Plan - 02/16/18 0954    Clinical Impression Statement  Pt presents post Lt lumpectomy  due to pT1a pN0 invasive ductal carcinoma, grade 2, with negative margins and 0/9 lymph nodes positive.  Treatment also consisted of Chemotherapy and Radiation.  Pt has had a recent onset of Lt shoulder stiffness and pain about 1 month ago and ongoing intermittent Lt breast and UE edema.  The shoulder ROM has had a significant decrease in AROM with pain consistent with adhesive capsulitis.  Does also have complaints of edema worse in the morning in the Rt breast and UE that may warrant compression needs.  She currently has 2 compression bras and sleeps in a bra which does help some.  She also reports sleeping on that side alot.  The circumferential measurements are not significant between sides today but with some subjective complaints.      History and Personal Factors relevant to plan of care:  left lumpectomy and axillary lymph node dissection 08/19/2017 for a residual pT1a pN0 invasive ductal carcinoma, grade 2, with negative margins. 9 lymph nodes removed, post chemo therapy and radiation. Lt pectoralis tear 5 years ago with ongoing pain    Clinical Presentation  Evolving    Clinical Presentation due to:  worsening of ROM and shoulder pain    Clinical Decision Making  Moderate    Rehab Potential  Excellent    PT Frequency  2x / week    PT Duration  8 weeks    PT Treatment/Interventions  Electrical Stimulation;Manual techniques;ADLs/Self Care Home Management;Therapeutic exercise;Patient/family education;Compression bandaging;Passive range of motion;Taping    PT Next Visit Plan  begin shoulder PROM/AAROM, visual inspection Lt breast, lymphedema risk factors handout    PT Home Exercise Plan  Access Code: [email or web address]    Consulted and Agree  with Plan of Care  Patient       Patient will benefit from skilled therapeutic intervention in order to improve the following deficits and impairments:  Pain, Decreased mobility, Postural dysfunction, Decreased range of motion, Decreased strength, Impaired UE  functional use, Decreased knowledge of precautions, Increased edema  Visit Diagnosis: Aftercare following surgery for neoplasm - Plan: PT plan of care cert/re-cert  Stiffness of left shoulder, not elsewhere classified - Plan: PT plan of care cert/re-cert  Acute pain of left shoulder - Plan: PT plan of care cert/re-cert  Lymphedema, not elsewhere classified - Plan: PT plan of care cert/re-cert     Problem List Patient Active Problem List   Diagnosis Date Noted  . Breast disorder   . Genetic testing 05/12/2017  . Family history of breast cancer   . Port catheter in place 04/28/2017  . Malignant neoplasm of overlapping sites of left breast in female, estrogen receptor negative ([place]) 03/05/2017  . Nontoxic multinodular goiter 12/25/2015  . Hyperlipidemia 12/25/2015  . Post-operative state 04/05/2014  . S/P subtotal hysterectomy 02/28/2014  . Chronic folliculitis 37/54/3606  . Annual physical exam 12/19/2013    Shan Levans, PT 02/16/2018, 10:17 AM  [place], [place], [address] Phone: [phone]   Fax:  [phone]  Name: NILSA MACHT MRN: [id number] Date of Birth: 10/21/1965

## 2018-02-24 ENCOUNTER — Ambulatory Visit: Payer: 59

## 2018-02-24 DIAGNOSIS — Z483 Aftercare following surgery for neoplasm: Secondary | ICD-10-CM | POA: Diagnosis not present

## 2018-02-24 DIAGNOSIS — M25512 Pain in left shoulder: Secondary | ICD-10-CM

## 2018-02-24 DIAGNOSIS — M25612 Stiffness of left shoulder, not elsewhere classified: Secondary | ICD-10-CM

## 2018-02-24 DIAGNOSIS — R293 Abnormal posture: Secondary | ICD-10-CM

## 2018-02-24 DIAGNOSIS — I890 Lymphedema, not elsewhere classified: Secondary | ICD-10-CM

## 2018-02-24 NOTE — Therapy (Signed)
[place], [place], [address] Phone: [phone]   Fax:  [phone]  Physical Therapy Treatment  Patient Details  Name: Maria Hester MRN: [id number] Date of Birth: [birth year] Referring Provider: Lurline Del, MD   Encounter Date: 02/24/2018  PT End of Session - 02/24/18 1018    Visit Number  2    Number of Visits  16    Date for PT Re-Evaluation  04/13/18    PT Start Time  0935    PT Stop Time  1017    PT Time Calculation (min)  42 min    Activity Tolerance  Patient tolerated treatment well    Behavior During Therapy  [facility] for tasks assessed/performed       Past Medical History:  Diagnosis Date  . Breast cancer ([place])   . 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 Cataracts, bilateral   . Complication of anesthesia    blood pressure very low after surgery difficult to wake -1st surgery only-when pt was 52 y.o.  . Family history of breast cancer   . Gallbladder disease    "sludge in gallbladder",   . History of hiatal hernia   . Hyperlipidemia   . Uterine fibroid     Past Surgical History:  Procedure Laterality Date  . ABDOMINAL HYSTERECTOMY    . ABDOMINOPLASTY N/A 02/28/2014   Procedure: ABDOMINOPLASTY with Panniculectomy;  Surgeon: [name], MD;  [facility];  Service: Gynecology;  Laterality: N/A;  . APPENDECTOMY    . BILATERAL SALPINGECTOMY Bilateral 02/28/2014   Procedure: BILATERAL SALPINGECTOMY;  Surgeon: [name], MD;  Location: [facility];  Service: Gynecology;  Laterality: Bilateral;  . BREAST LUMPECTOMY WITH RADIOACTIVE SEED AND SENTINEL LYMPH NODE BIOPSY Left 08/19/2017   Procedure: LEFT BREAST LUMPECTOMY WITH RADIOACTIVE SEED AND LEFT SENTINEL LYMPH NODE BIOPSY;  Surgeon: Excell Seltzer, MD;  Location: [place];  Service: General;  Laterality: Left;  . CATARACT EXTRACTION, BILATERAL    . CHOLECYSTECTOMY  N/A 08/19/2017   Procedure: LAPAROSCOPIC CHOLECYSTECTOMY WITH INTRAOPERATIVE CHOLANGIOGRAM;  Surgeon: Excell Seltzer, MD;  Location: [place];  Service: General;  Laterality: N/A;  . PORTACATH PLACEMENT Right 03/18/2017   Procedure: INSERTION PORT-A-CATH;  Surgeon: Excell Seltzer, MD;  [facility];  Service: General;  Laterality: Right;  . reverse tubal ligation    . SCAR REVISION N/A 02/28/2014   Procedure: SCAR REVISION;  Surgeon: [name], MD;  [facility]: [facility];  Service: Gynecology;  Laterality: N/A;  . SUPRACERVICAL ABDOMINAL HYSTERECTOMY N/A 02/28/2014   Procedure: HYSTERECTOMY SUPRACERVICAL ABDOMINAL;  Surgeon: [name], MD;  [facility]: [facility];  Service: Gynecology;  Laterality: N/A;  . TUBAL LIGATION    . WISDOM TOOTH EXTRACTION      There were no vitals filed for this visit.  Subjective Assessment - 02/24/18 0946    Subjective  I've been doing the exercises with the dowel (using a golf club) and the flexion hurts at the end of ROM, but the shoulder blade squeezes feel good.     Pertinent History  left lumpectomy and axillary lymph node dissection 08/19/2017 for a residual pT1a pN0 invasive ductal carcinoma, grade 2, with negative margins, repeat prognostic panel again estrogen and progesterone receptor negative, but HER-2 amplified.  Radiation completed,  chemotherapy consisting of carboplatin and Docetaxel given with trastuzumab and Pertuzumab every 21 days for 6 cycles, started 03/31/2017, completed 07/14/2017, history  of Rt pectoralis pull/tear at work 5 years ago that was still giving her trouble    Patient Stated Goals  improve the ROM of the Lt shoulder     Currently in Pain?  No/denies                       [facility] Adult PT Treatment/Exercise - 02/24/18 0001      Shoulder Exercises: Pulleys   Flexion  2 minutes    Flexion Limitations  Pt returned correct demonstration by therapist but did require tactile cues initially to decrease Lt scapular  compensation, this very difficult for her.     ABduction  2 minutes    ABduction Limitations  Pt returned therapist demonstration but still struggles with decreasing Lt scapular compensation with this      Shoulder Exercises: Therapy Ball   Flexion  Both;10 reps With forward lean intpo end of stretch    Flexion Limitations  Tactile cues to decrease scapular compensation      Shoulder Exercises: Stretch   Wall Stretch - ABduction  5 reps;10 seconds UE Ranger    Wall Stretch - ABduction Limitations  Demonstration for correct technique      Manual Therapy   Manual Therapy  Passive ROM;Soft tissue mobilization;Neural Stretch    Soft tissue mobilization  In Supine to Lt shoulder at superior and anterior aspects    Passive ROM  In Supine to Lt shoulder into flexion, abduction, and er to pts tolerance with slowly progressive stretches    Neural Stretch  To Lt shoulder                  PT Long Term Goals - 02/16/18 1012      PT LONG TERM GOAL #1   Title  Pt will be aware of lymphedema risk reduction practices    Time  8    Period  Weeks    Status  New    Target Date  04/13/18      PT LONG TERM GOAL #2   Title  Pt will improve Lt shoulder AROM to; flexion 155, abduction 155, and ER 70 to demonstrate improved function    Time  8    Period  Weeks    Status  New    Target Date  04/13/18      PT LONG TERM GOAL #3   Title  Pt will be able to don her bra without limitations due to shoulder pain    Time  8    Period  Weeks    Status  New    Target Date  04/13/18      PT LONG TERM GOAL #4   Title  Pt will be independent with HEP for continued shoulder strength and ROM    Time  8    Period  Weeks    Status  New    Target Date  04/13/18      PT LONG TERM GOAL #5   Title  Pt will be knowledgeable about UE compression needs and how to obtain this    Time  8    Period  Weeks    Status  New    Target Date  04/13/18            Plan - 02/24/18 1019    Clinical  Impression Statement  Pt tolerated first session of strtching well overall, though she is limited by muscle tension/tightness which causes her pain if she  pushes to hard in to her end ROM. Spent time instructing her to use mirror at hme for some of her AA/ROM stretching to watch her technique/compensations. She verbalized understanding this. And pt did well with P/ROM though does require VCs throughout to remind her to relax tight, guarded muscles. She reported shoulder feeling looser by end of session, no increased pain.     Rehab Potential  Excellent    PT Frequency  2x / week    PT Duration  8 weeks    PT Treatment/Interventions  Electrical Stimulation;Manual techniques;ADLs/Self Care Home Management;Therapeutic exercise;Patient/family education;Compression bandaging;Passive range of motion;Taping    PT Next Visit Plan  Cont shoulder PROM/AAROM, visual inspection Lt breast, lymphedema risk factors handout    Consulted and Agree with Plan of Care  Patient       Patient will benefit from skilled therapeutic intervention in order to improve the following deficits and impairments:  Pain, Decreased mobility, Postural dysfunction, Decreased range of motion, Decreased strength, Impaired UE functional use, Decreased knowledge of precautions, Increased edema  Visit Diagnosis: Aftercare following surgery for neoplasm  Stiffness of left shoulder, not elsewhere classified  Acute pain of left shoulder  Lymphedema, not elsewhere classified  Abnormal posture     Problem List Patient Active Problem List   Diagnosis Date Noted  . Breast disorder   . Genetic testing 05/12/2017  . Family history of breast cancer   . Port catheter in place 04/28/2017  . Malignant neoplasm of overlapping sites of left breast in female, estrogen receptor negative ([place]) 03/05/2017  . Nontoxic multinodular goiter 12/25/2015  . Hyperlipidemia 12/25/2015  . Post-operative state 04/05/2014  . S/P subtotal hysterectomy  02/28/2014  . Chronic folliculitis 10/15/7251  . Annual physical exam 12/19/2013    Golda Acre 02/24/2018, 10:22 AM  [place], [place], [address] Phone: [phone]   Fax:  [phone]  Name: Maria Hester MRN: [id number] Date of Birth: 04-18-1966

## 2018-02-26 ENCOUNTER — Encounter: Payer: Self-pay | Admitting: Rehabilitation

## 2018-02-26 ENCOUNTER — Ambulatory Visit: Payer: 59 | Admitting: Rehabilitation

## 2018-02-26 DIAGNOSIS — M25612 Stiffness of left shoulder, not elsewhere classified: Secondary | ICD-10-CM

## 2018-02-26 DIAGNOSIS — M25512 Pain in left shoulder: Secondary | ICD-10-CM

## 2018-02-26 DIAGNOSIS — Z483 Aftercare following surgery for neoplasm: Secondary | ICD-10-CM | POA: Diagnosis not present

## 2018-02-26 DIAGNOSIS — R293 Abnormal posture: Secondary | ICD-10-CM

## 2018-02-26 DIAGNOSIS — I890 Lymphedema, not elsewhere classified: Secondary | ICD-10-CM

## 2018-02-26 NOTE — Therapy (Signed)
[place], [place], [address] Phone: [phone]   Fax:  [phone]  Physical Therapy Treatment  Patient Details  Name: Maria Hester MRN: [id number] Date of Birth: Sep 27, 1966 Referring Provider: Lurline Del, MD   Encounter Date: 02/26/2018  PT End of Session - 02/26/18 1016    Visit Number  3    Number of Visits  16    Date for PT Re-Evaluation  04/13/18    PT Start Time  0930    PT Stop Time  1015    PT Time Calculation (min)  45 min    Activity Tolerance  Patient tolerated treatment well       Past Medical History:  Diagnosis Date  . Breast cancer ([place])   . 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 Cataracts, bilateral   . Complication of anesthesia    blood pressure very low after surgery difficult to wake -1st surgery only-when pt was 52 y.o.  . Family history of breast cancer   . Gallbladder disease    "sludge in gallbladder",   . History of hiatal hernia   . Hyperlipidemia   . Uterine fibroid     Past Surgical History:  Procedure Laterality Date  . ABDOMINAL HYSTERECTOMY    . ABDOMINOPLASTY N/A 02/28/2014   Procedure: ABDOMINOPLASTY with Panniculectomy;  Surgeon: [name], MD;  [facility];  Service: Gynecology;  Laterality: N/A;  . APPENDECTOMY    . BILATERAL SALPINGECTOMY Bilateral 02/28/2014   Procedure: BILATERAL SALPINGECTOMY;  Surgeon: [name], MD;  [facility]: [facility];  Service: Gynecology;  Laterality: Bilateral;  . BREAST LUMPECTOMY WITH RADIOACTIVE SEED AND SENTINEL LYMPH NODE BIOPSY Left 08/19/2017   Procedure: LEFT BREAST LUMPECTOMY WITH RADIOACTIVE SEED AND LEFT SENTINEL LYMPH NODE BIOPSY;  Surgeon: Excell Seltzer, MD;  Location: [facility];  Service: General;  Laterality: Left;  . CATARACT EXTRACTION, BILATERAL    . CHOLECYSTECTOMY N/A 08/19/2017   Procedure: LAPAROSCOPIC CHOLECYSTECTOMY WITH  INTRAOPERATIVE CHOLANGIOGRAM;  Surgeon: Excell Seltzer, MD;  Location: [facility];  Service: General;  Laterality: N/A;  . PORTACATH PLACEMENT Right 03/18/2017   Procedure: INSERTION PORT-A-CATH;  Surgeon: Excell Seltzer, MD;  [facility];  Service: General;  Laterality: Right;  . reverse tubal ligation    . SCAR REVISION N/A 02/28/2014   Procedure: SCAR REVISION;  Surgeon: [name], MD;  [facility];  Service: Gynecology;  Laterality: N/A;  . SUPRACERVICAL ABDOMINAL HYSTERECTOMY N/A 02/28/2014   Procedure: HYSTERECTOMY SUPRACERVICAL ABDOMINAL;  Surgeon: [name], MD;  [facility];  Service: Gynecology;  Laterality: N/A;  . TUBAL LIGATION    . WISDOM TOOTH EXTRACTION      There were no vitals filed for this visit.  Subjective Assessment - 02/26/18 0931    Subjective  Felt good after last visit.  The tension in the arm was less after the last visit      Pertinent History  left lumpectomy and axillary lymph node dissection 08/19/2017 for a residual pT1a pN0 invasive ductal carcinoma, grade 2, with negative margins, repeat prognostic panel again estrogen and progesterone receptor negative, but HER-2 amplified.  Radiation completed,  chemotherapy consisting of carboplatin and Docetaxel given with trastuzumab and Pertuzumab every 21 days for 6 cycles, started 03/31/2017, completed 07/14/2017, history of Rt pectoralis pull/tear at work 5 years ago that was still giving her trouble    Patient Stated Goals  improve the ROM of the Lt shoulder     Currently in Pain?  Yes    Pain Score  1     Pain Location  -- Rt UT    Pain Orientation  Left    Pain Descriptors / Indicators  Aching                       [facility] Adult PT Treatment/Exercise - 02/26/18 0001      Exercises   Exercises  Shoulder      Shoulder Exercises: Standing   Row  Both;15 reps    Theraband Level (Shoulder Row)  Level 2 (Red)      Shoulder Exercises: Pulleys   Flexion  3 minutes     Flexion Limitations  long holds    ABduction  3 minutes    ABduction Limitations  long holds      Shoulder Exercises: Stretch   Other Shoulder Stretches  wall ladder flexion 5x10" and scaption (abduction too painful)       Manual Therapy   Manual Therapy  Joint mobilization    Joint Mobilization  AP and inferior GH glides grade IV- 3x30" each in neutral and ER    Soft tissue mobilization  Lt upper trap into anterior shoulder    Passive ROM  In Supine to Lt shoulder into flexion, abduction, and er to pts tolerance with slowly progressive stretches    Neural Stretch  To Lt shoulder                  PT Long Term Goals - 02/26/18 1018      PT LONG TERM GOAL #1   Title  Pt will be aware of lymphedema risk reduction practices    Status  On-going      PT LONG TERM GOAL #2   Title  Pt will improve Lt shoulder AROM to; flexion 155, abduction 155, and ER 70 to demonstrate improved function    Status  On-going      PT LONG TERM GOAL #3   Title  Pt will be able to don her bra without limitations due to shoulder pain    Status  On-going      PT LONG TERM GOAL #4   Title  Pt will be independent with HEP for continued shoulder strength and ROM    Status  On-going      PT LONG TERM GOAL #5   Title  Pt will be knowledgeable about UE compression needs and how to obtain this if necessary    Status  On-going            Plan - 02/26/18 1016    Clinical Impression Statement  Pt continues with limited but improving ROM into abduction, flexion, and ER at the Lt shoulder.  Significant trigger points and restrictions with STM into the Upper trap, supraspinatus, and latissimus today.  Pt also reporting AP and inferior joint glides felt good.  Feeling better post treatment    PT Frequency  2x / week    PT Duration  8 weeks    PT Treatment/Interventions  Electrical Stimulation;Manual techniques;ADLs/Self Care Home Management;Therapeutic exercise;Patient/family education;Compression  bandaging;Passive range of motion;Taping    PT Next Visit Plan  Cont shoulder PROM/AAROM, visual inspection Lt breast, lymphedema risk factors handout, continue STM Lt UT, shoulder complex with trigger point release as needed    PT Home Exercise Plan  Access Code: [facility]  Patient will benefit from skilled therapeutic intervention in order to improve the following deficits and impairments:  Pain, Decreased mobility, Postural dysfunction, Decreased range of motion, Decreased strength, Impaired UE functional use, Decreased knowledge of precautions, Increased edema  Visit Diagnosis: Aftercare following surgery for neoplasm  Stiffness of left shoulder, not elsewhere classified  Acute pain of left shoulder  Lymphedema, not elsewhere classified  Abnormal posture     Problem List Patient Active Problem List   Diagnosis Date Noted  . Breast disorder   . Genetic testing 05/12/2017  . Family history of breast cancer   . Port catheter in place 04/28/2017  . Malignant neoplasm of overlapping sites of left breast in female, estrogen receptor negative ([place]) 03/05/2017  . Nontoxic multinodular goiter 12/25/2015  . Hyperlipidemia 12/25/2015  . Post-operative state 04/05/2014  . S/P subtotal hysterectomy 02/28/2014  . Chronic folliculitis 54/06/8118  . Annual physical exam 12/19/2013    Shan Levans PT 02/26/2018, 10:19 AM  [place], [place], [address] Phone: [phone]   Fax:  [phone]  Name: Maria Hester MRN: [id number] Date of Birth: 27-Nov-1965

## 2018-03-02 ENCOUNTER — Ambulatory Visit: Payer: 59 | Admitting: Rehabilitation

## 2018-03-05 ENCOUNTER — Ambulatory Visit: Payer: 59 | Admitting: Rehabilitation

## 2018-03-05 ENCOUNTER — Encounter: Payer: Self-pay | Admitting: Rehabilitation

## 2018-03-05 DIAGNOSIS — M25612 Stiffness of left shoulder, not elsewhere classified: Secondary | ICD-10-CM

## 2018-03-05 DIAGNOSIS — M25512 Pain in left shoulder: Secondary | ICD-10-CM

## 2018-03-05 DIAGNOSIS — Z483 Aftercare following surgery for neoplasm: Secondary | ICD-10-CM

## 2018-03-05 NOTE — Therapy (Signed)
[place], [place], [address] Phone: [phone]   Fax:  [phone]  Physical Therapy Treatment  Patient Details  Name: Maria Hester MRN: [id number] Date of Birth: November 01, 1965 Referring Provider: Lurline Del, MD   Encounter Date: 03/05/2018  PT End of Session - 03/05/18 1002    Visit Number  4    Number of Visits  16    Date for PT Re-Evaluation  04/13/18    PT Start Time  0930    PT Stop Time  1015    PT Time Calculation (min)  45 min    Activity Tolerance  Patient tolerated treatment well    Behavior During Therapy  [facility] for tasks assessed/performed       Past Medical History:  Diagnosis Date  . Breast cancer ([place])   . 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 Cataracts, bilateral   . Complication of anesthesia    blood pressure very low after surgery difficult to wake -1st surgery only-when pt was 52 y.o.  . Family history of breast cancer   . Gallbladder disease    "sludge in gallbladder",   . History of hiatal hernia   . Hyperlipidemia   . Uterine fibroid     Past Surgical History:  Procedure Laterality Date  . ABDOMINAL HYSTERECTOMY    . ABDOMINOPLASTY N/A 02/28/2014   Procedure: ABDOMINOPLASTY with Panniculectomy;  Surgeon: [name], MD;  [facility];  Service: Gynecology;  Laterality: N/A;  . APPENDECTOMY    . BILATERAL SALPINGECTOMY Bilateral 02/28/2014   Procedure: BILATERAL SALPINGECTOMY;  Surgeon: [name], MD;  [facility];  Service: Gynecology;  Laterality: Bilateral;  . BREAST LUMPECTOMY WITH RADIOACTIVE SEED AND SENTINEL LYMPH NODE BIOPSY Left 08/19/2017   Procedure: LEFT BREAST LUMPECTOMY WITH RADIOACTIVE SEED AND LEFT SENTINEL LYMPH NODE BIOPSY;  Surgeon: Excell Seltzer, MD;  Location: [place];  Service: General;  Laterality: Left;  . CATARACT EXTRACTION, BILATERAL    . CHOLECYSTECTOMY  N/A 08/19/2017   Procedure: LAPAROSCOPIC CHOLECYSTECTOMY WITH INTRAOPERATIVE CHOLANGIOGRAM;  Surgeon: Excell Seltzer, MD;  Location: [facility];  Service: General;  Laterality: N/A;  . PORTACATH PLACEMENT Right 03/18/2017   Procedure: INSERTION PORT-A-CATH;  Surgeon: Excell Seltzer, MD;  [facility];  Service: General;  Laterality: Right;  . reverse tubal ligation    . SCAR REVISION N/A 02/28/2014   Procedure: SCAR REVISION;  Surgeon: [name], MD;  [facility];  Service: Gynecology;  Laterality: N/A;  . SUPRACERVICAL ABDOMINAL HYSTERECTOMY N/A 02/28/2014   Procedure: HYSTERECTOMY SUPRACERVICAL ABDOMINAL;  Surgeon: [name], MD;  [facility];  Service: Gynecology;  Laterality: N/A;  . TUBAL LIGATION    . WISDOM TOOTH EXTRACTION      There were no vitals filed for this visit.  Subjective Assessment - 03/05/18 0930    Subjective  I have days that it feels like it is better.  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  Radiation completed,  chemotherapy consisting of carboplatin and Docetaxel given with trastuzumab and Pertuzumab every 21 days for 6 cycles, started 03/31/2017, completed 07/14/2017, history of Rt pectoralis pull/tear at work  5 years ago that was still giving her trouble    Limitations  Lifting    Patient Stated Goals  improve the ROM of the Lt shoulder     Currently in Pain?  No/denies    Aggravating Factors   up to a 4/10 with reaching and lifting    Pain Relieving Factors  position and hot shower                       [facility] Adult PT Treatment/Exercise - 03/05/18 0001      Shoulder Exercises: Standing   Row  20 reps;Theraband    Theraband Level (Shoulder Row)   Level 3 (Green)      Shoulder Exercises: Pulleys   Flexion  3 minutes    Flexion Limitations  long holds    ABduction  3 minutes    ABduction Limitations  long holds      Shoulder Exercises: Stretch   Other Shoulder Stretches  wall ladder flexion 5x10"   got to 23    Other Shoulder Stretches  attempted doorway bicep/pect stretch but with too much pinching      Manual Therapy   Joint Mobilization  AP and inferior GH glides grade IV- 3x30" each in neutral and ER    Soft tissue mobilization  Lt upper trap into anterior shoulder with trigger point release to UT, supraspinatus    Passive ROM  In Supine to Lt shoulder into flexion, abduction, and er to pts tolerance with slowly progressive stretches,  L UT stretch x 30sec                  PT Long Term Goals - 03/05/18 1006      PT LONG TERM GOAL #1   Title  Pt will be aware of lymphedema risk reduction practices    Status  On-going      PT LONG TERM GOAL #2   Title  Pt will improve Lt shoulder AROM to; flexion 155, abduction 155, and ER 70 to demonstrate improved function    Status  On-going      PT LONG TERM GOAL #3   Title  Pt will be able to don her bra without limitations due to shoulder pain    Status  On-going      PT LONG TERM GOAL #4   Title  Pt will be independent with HEP for continued shoulder strength and ROM    Status  On-going      PT LONG TERM GOAL #5   Title  Pt will be knowledgeable about UE compression needs and how to obtain this if necessary    Status  On-going            Plan - 03/05/18 1003    Clinical Impression Statement  Continues with limited but improving ROM into all directions.  Decreased and more pain with PROM today but also performed at the beginning of treatment instead of after exercises.  After trigger points in the supraspinatus and UT.  Does report some nerve like pain down the arm that we should address with neural mobliity.  Overall shoulder girdle tightness    Rehab  Potential  Excellent    PT Frequency  2x / week    PT Duration  8 weeks    PT Treatment/Interventions  Electrical Stimulation;Manual techniques;ADLs/Self Care Home Management;Therapeutic exercise;Patient/family education;Compression bandaging;Passive range of motion;Taping    PT Next Visit Plan  Cont shoulder PROM/AAROM, lymphedema risk factors  handout, continue STM Lt UT, shoulder complex include posterior work with trigger point release as needed. ULTT next time for nerve mobility/nerve glides    PT Home Exercise Plan  Access Code: [email or web address]       Patient will benefit from skilled therapeutic intervention in order to improve the following deficits and impairments:  Pain, Decreased mobility, Postural dysfunction, Decreased range of motion, Decreased strength, Impaired UE functional use, Decreased knowledge of precautions, Increased edema  Visit Diagnosis: Aftercare following surgery for neoplasm  Stiffness of left shoulder, not elsewhere classified  Acute pain of left shoulder     Problem List Patient Active Problem List   Diagnosis Date Noted  . Breast disorder   . Genetic testing 05/12/2017  . Family history of breast cancer   . Port catheter in place 04/28/2017  . Malignant neoplasm of overlapping sites of left breast in female, estrogen receptor negative ([place]) 03/05/2017  . Nontoxic multinodular goiter 12/25/2015  . Hyperlipidemia 12/25/2015  . Post-operative state 04/05/2014  . S/P subtotal hysterectomy 02/28/2014  . Chronic folliculitis 60/47/9987  . Annual physical exam 12/19/2013    Shan Levans, PT 03/05/2018, 10:17 AM  [place], [place], [address] Phone: [phone]   Fax:  [phone]  Name: Maria Hester MRN: [id number] Date of Birth: 04/17/1966

## 2018-03-09 ENCOUNTER — Encounter: Payer: Self-pay | Admitting: Rehabilitation

## 2018-03-09 ENCOUNTER — Ambulatory Visit: Payer: 59 | Admitting: Rehabilitation

## 2018-03-09 DIAGNOSIS — Z483 Aftercare following surgery for neoplasm: Secondary | ICD-10-CM | POA: Diagnosis not present

## 2018-03-09 DIAGNOSIS — M25612 Stiffness of left shoulder, not elsewhere classified: Secondary | ICD-10-CM

## 2018-03-09 DIAGNOSIS — M25512 Pain in left shoulder: Secondary | ICD-10-CM

## 2018-03-09 NOTE — Therapy (Signed)
[place], [place], [address] Phone: [phone]   Fax:  [phone]  Physical Therapy Treatment  Patient Details  Name: Maria Hester MRN: [id number] Date of Birth: 1966-05-24 Referring Provider: Lurline Del, MD   Encounter Date: 03/09/2018  PT End of Session - 03/09/18 1027    Visit Number  5    Number of Visits  16    Date for PT Re-Evaluation  04/13/18    PT Start Time  0940    PT Stop Time  1020    PT Time Calculation (min)  40 min    Activity Tolerance  Patient tolerated treatment well    Behavior During Therapy  [facility] for tasks assessed/performed       Past Medical History:  Diagnosis Date  . Breast cancer ([place])   . 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 Cataracts, bilateral   . Complication of anesthesia    blood pressure very low after surgery difficult to wake -1st surgery only-when pt was 52 y.o.  . Family history of breast cancer   . Gallbladder disease    "sludge in gallbladder",   . History of hiatal hernia   . Hyperlipidemia   . Uterine fibroid     Past Surgical History:  Procedure Laterality Date  . ABDOMINAL HYSTERECTOMY    . ABDOMINOPLASTY N/A 02/28/2014   Procedure: ABDOMINOPLASTY with Panniculectomy;  Surgeon: [name], MD;  [facility];  Service: Gynecology;  Laterality: N/A;  . APPENDECTOMY    . BILATERAL SALPINGECTOMY Bilateral 02/28/2014   Procedure: BILATERAL SALPINGECTOMY;  Surgeon: [name], MD;  [facility];  Service: Gynecology;  Laterality: Bilateral;  . BREAST LUMPECTOMY WITH RADIOACTIVE SEED AND SENTINEL LYMPH NODE BIOPSY Left 08/19/2017   Procedure: LEFT BREAST LUMPECTOMY WITH RADIOACTIVE SEED AND LEFT SENTINEL LYMPH NODE BIOPSY;  Surgeon: Excell Seltzer, MD;  Location: [place];  Service: General;  Laterality: Left;  . CATARACT EXTRACTION, BILATERAL    . CHOLECYSTECTOMY  N/A 08/19/2017   Procedure: LAPAROSCOPIC CHOLECYSTECTOMY WITH INTRAOPERATIVE CHOLANGIOGRAM;  Surgeon: Excell Seltzer, MD;  Location: [place];  Service: General;  Laterality: N/A;  . PORTACATH PLACEMENT Right 03/18/2017   Procedure: INSERTION PORT-A-CATH;  Surgeon: Excell Seltzer, MD;  [facility];  Service: General;  Laterality: Right;  . reverse tubal ligation    . SCAR REVISION N/A 02/28/2014   Procedure: SCAR REVISION;  Surgeon: [name], MD;  [facility];  Service: Gynecology;  Laterality: N/A;  . SUPRACERVICAL ABDOMINAL HYSTERECTOMY N/A 02/28/2014   Procedure: HYSTERECTOMY SUPRACERVICAL ABDOMINAL;  Surgeon: [name], MD;  [facility];  Service: Gynecology;  Laterality: N/A;  . TUBAL LIGATION    . WISDOM TOOTH EXTRACTION      There were no vitals filed for this visit.  Subjective Assessment - 03/09/18 0937    Subjective  My R wrist has really been hurting since last Wednesday morning.  Probably from how I have slept on it.  I thought it would just go away but it has not.  The Rt shoulder may have some increased ROM but the pain is still the same.      Pertinent History  left lumpectomy and axillary lymph node dissection 08/19/2017 for a residual pT1a pN0 invasive ductal carcinoma, grade 2, with negative margins, repeat prognostic panel again estrogen and progesterone receptor negative, but HER-2 amplified.  Radiation completed,  chemotherapy  consisting of carboplatin and Docetaxel given with trastuzumab and Pertuzumab every 21 days for 6 cycles, started 03/31/2017, completed 07/14/2017, history of Rt pectoralis pull/tear at work 5 years ago that was still giving her trouble    Limitations  Lifting    Patient Stated Goals  improve the ROM of the Lt shoulder     Currently in Pain?  No/denies         [facility] PT Assessment - 03/09/18 0001      AROM   Left Shoulder Flexion  120 Degrees    Left Shoulder ABduction  98 Degrees    Left Shoulder External Rotation   45 Degrees      PROM   Left Shoulder Flexion  122 Degrees    Left Shoulder ABduction  105 Degrees                   [facility] Adult PT Treatment/Exercise - 03/09/18 0001      Shoulder Exercises: Pulleys   Flexion  3 minutes    Flexion Limitations  long holds    ABduction  3 minutes    ABduction Limitations  long holds    Other Pulley Exercises  IR standing 62mnutes      Shoulder Exercises: ROM/Strengthening   Other ROM/Strengthening Exercises  standing dowel rod extension x 10, dowel rod extension with horizontal abduction x 10       Manual Therapy   Joint Mobilization  AP glides in ER end range grade IV 3x30"    Soft tissue mobilization  Sidelying STM UT, rhomboids, latissimus    Passive ROM  in supine all directions to tolerance and sidelying into abduction             PT Education - 03/09/18 1027    Education provided  Yes    Education Details  HEP update    Person(s) Educated  Patient    Methods  Explanation;Demonstration;Verbal cues;Handout    Comprehension  Verbalized understanding          PT Long Term Goals - 03/05/18 1006      PT LONG TERM GOAL #1   Title  Pt will be aware of lymphedema risk reduction practices    Status  On-going      PT LONG TERM GOAL #2   Title  Pt will improve Lt shoulder AROM to; flexion 155, abduction 155, and ER 70 to demonstrate improved function    Status  On-going      PT LONG TERM GOAL #3   Title  Pt will be able to don her bra without limitations due to shoulder pain    Status  On-going      PT LONG TERM GOAL #4   Title  Pt will be independent with HEP for continued shoulder strength and ROM    Status  On-going      PT LONG TERM GOAL #5   Title  Pt will be knowledgeable about UE compression needs and how to obtain this if necessary    Status  On-going            Plan - 03/09/18 1027    Clinical Impression Statement  Continues with limited but slowly improving ROM.  Objective measures with slight  improvements today with passive ABDuction improving the most.  Less trigger points today but tenderness and pain UT, latissiums present.  Rt wrist pain consistent with DeQuervain's Tenosynovitis    Clinical Decision Making  Moderate    Rehab Potential  Excellent    PT Frequency  2x / week    PT Duration  8 weeks    PT Treatment/Interventions  Electrical Stimulation;Manual techniques;ADLs/Self Care Home Management;Therapeutic exercise;Patient/family education;Compression bandaging;Passive range of motion;Taping    PT Next Visit Plan  Cont shoulder PROM/AAROM, lymphedema risk factors handout, continue STM Lt UT, shoulder complex include posterior work with trigger point release as needed. ULTT next time for nerve mobility/nerve glides    PT Home Exercise Plan  Access Code: [email or web address]       Patient will benefit from skilled therapeutic intervention in order to improve the following deficits and impairments:  Pain, Decreased mobility, Postural dysfunction, Decreased range of motion, Decreased strength, Impaired UE functional use, Decreased knowledge of precautions, Increased edema  Visit Diagnosis: Aftercare following surgery for neoplasm  Stiffness of left shoulder, not elsewhere classified  Acute pain of left shoulder     Problem List Patient Active Problem List   Diagnosis Date Noted  . Breast disorder   . Genetic testing 05/12/2017  . Family history of breast cancer   . Port catheter in place 04/28/2017  . Malignant neoplasm of overlapping sites of left breast in female, estrogen receptor negative ([place]) 03/05/2017  . Nontoxic multinodular goiter 12/25/2015  . Hyperlipidemia 12/25/2015  . Post-operative state 04/05/2014  . S/P subtotal hysterectomy 02/28/2014  . Chronic folliculitis 32/54/9826  . Annual physical exam 12/19/2013    Shan Levans, PT 03/09/2018, 10:30 AM  [place], [place],  [address] Phone: [phone]   Fax:  [phone]  Name: Maria Hester MRN: [id number] Date of Birth: Apr 27, 1966

## 2018-03-09 NOTE — Patient Instructions (Signed)
Added to HEP: Standing Shoulder Extension with Dowel - 10 reps - 3 sets - 1x daily - 7x weekly  Standing Shoulder Internal Rotation AAROM with Dowel - 10 reps - 3 sets - 1x daily - 7x weekly   Showed patient DeQuervains wrist stretch

## 2018-03-12 ENCOUNTER — Encounter: Payer: Self-pay | Admitting: Rehabilitation

## 2018-03-12 ENCOUNTER — Ambulatory Visit: Payer: 59 | Admitting: Rehabilitation

## 2018-03-12 DIAGNOSIS — Z483 Aftercare following surgery for neoplasm: Secondary | ICD-10-CM

## 2018-03-12 DIAGNOSIS — R293 Abnormal posture: Secondary | ICD-10-CM

## 2018-03-12 DIAGNOSIS — M25612 Stiffness of left shoulder, not elsewhere classified: Secondary | ICD-10-CM

## 2018-03-12 DIAGNOSIS — M25512 Pain in left shoulder: Secondary | ICD-10-CM

## 2018-03-12 NOTE — Therapy (Signed)
[place], [place], [address] Phone: [phone]   Fax:  [phone]  Physical Therapy Treatment  Patient Details  Name: Maria Hester MRN: [id number] Date of Birth: 06/07/1966 Referring Provider: Lurline Del, MD   Encounter Date: 03/12/2018  PT End of Session - 03/12/18 1026    Visit Number  6    Number of Visits  16    Date for PT Re-Evaluation  04/13/18    PT Start Time  0935    PT Stop Time  1022    PT Time Calculation (min)  47 min    Activity Tolerance  Patient tolerated treatment well    Behavior During Therapy  [facility] for tasks assessed/performed       Past Medical History:  Diagnosis Date  . Breast cancer ([place])   . 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 Cataracts, bilateral   . Complication of anesthesia    blood pressure very low after surgery difficult to wake -1st surgery only-when pt was 52 y.o.  . Family history of breast cancer   . Gallbladder disease    "sludge in gallbladder",   . History of hiatal hernia   . Hyperlipidemia   . Uterine fibroid     Past Surgical History:  Procedure Laterality Date  . ABDOMINAL HYSTERECTOMY    . ABDOMINOPLASTY N/A 02/28/2014   Procedure: ABDOMINOPLASTY with Panniculectomy;  Surgeon: [name], MD;  [facility];  Service: Gynecology;  Laterality: N/A;  . APPENDECTOMY    . BILATERAL SALPINGECTOMY Bilateral 02/28/2014   Procedure: BILATERAL SALPINGECTOMY;  Surgeon: [name], MD;  [facility]: [facility];  Service: Gynecology;  Laterality: Bilateral;  . BREAST LUMPECTOMY WITH RADIOACTIVE SEED AND SENTINEL LYMPH NODE BIOPSY Left 08/19/2017   Procedure: LEFT BREAST LUMPECTOMY WITH RADIOACTIVE SEED AND LEFT SENTINEL LYMPH NODE BIOPSY;  Surgeon: Excell Seltzer, MD;  Location: [place];  Service: General;  Laterality: Left;  . CATARACT EXTRACTION, BILATERAL    . CHOLECYSTECTOMY  N/A 08/19/2017   Procedure: LAPAROSCOPIC CHOLECYSTECTOMY WITH INTRAOPERATIVE CHOLANGIOGRAM;  Surgeon: Excell Seltzer, MD;  Location: [facility];  Service: General;  Laterality: N/A;  . PORTACATH PLACEMENT Right 03/18/2017   Procedure: INSERTION PORT-A-CATH;  Surgeon: Excell Seltzer, MD;  [facility];  Service: General;  Laterality: Right;  . reverse tubal ligation    . SCAR REVISION N/A 02/28/2014   Procedure: SCAR REVISION;  Surgeon: [name], MD;  [facility];  Service: Gynecology;  Laterality: N/A;  . SUPRACERVICAL ABDOMINAL HYSTERECTOMY N/A 02/28/2014   Procedure: HYSTERECTOMY SUPRACERVICAL ABDOMINAL;  Surgeon: [name], MD;  [facility];  Service: Gynecology;  Laterality: N/A;  . TUBAL LIGATION    . WISDOM TOOTH EXTRACTION      There were no vitals filed for this visit.  Subjective Assessment - 03/12/18 0936    Subjective  I have been doing yoga and can't do the downward dog or raise my hands overhead.  The muscles just feel super tight.      Pertinent History  left lumpectomy and axillary lymph node dissection 08/19/2017 for a residual pT1a pN0 invasive ductal carcinoma, grade 2, with negative margins, repeat prognostic panel again estrogen and progesterone receptor negative, but HER-2 amplified.  Radiation completed,  chemotherapy consisting of carboplatin and Docetaxel given with trastuzumab and Pertuzumab every 21 days for 6 cycles, started 03/31/2017, completed 07/14/2017, history of Rt pectoralis pull/tear  at work 5 years ago that was still giving her trouble    Patient Stated Goals  improve the ROM of the Lt shoulder                        [facility] Adult PT Treatment/Exercise - 03/12/18 0001      Shoulder Exercises: Supine   Protraction  Left;15 reps;Weights    Protraction Weight (lbs)  2#    Flexion  Left;Weights;15 reps    Shoulder Flexion Weight (lbs)  2# with bolster on table for flexion lowering assist      Shoulder Exercises:  Prone   Horizontal ABduction 1  10 reps;AAROM scapular assist      Shoulder Exercises: Sidelying   External Rotation  Left;15 reps;Weights    External Rotation Weight (lbs)  2      Shoulder Exercises: Standing   Flexion  AAROM;Left;10 reps MWM    ABduction  AAROM;10 reps MWM      Shoulder Exercises: Pulleys   Flexion  3 minutes    Flexion Limitations  long holds    ABduction  3 minutes    ABduction Limitations  long holds      Shoulder Exercises: ROM/Strengthening   UBE (Upper Arm Bike)  level 2 91mn/2min      Manual Therapy   Manual Therapy  Taping    Kinesiotex  Create Space      Kinesiotix   Create Space  Lt shoulder correction into retraction and ER with 1 Y tape from deltoid along supraspinatus and infraspinatus and 1 I strip anterior shoulder onto back around 70% pull             PT Education - 03/12/18 1025    Education provided  Yes    Education Details  use of kinesiotape and how to remove    Person(s) Educated  Patient    Methods  Explanation    Comprehension  Verbalized understanding          PT Long Term Goals - 03/12/18 1028      PT LONG TERM GOAL #1   Title  Pt will be aware of lymphedema risk reduction practices    Status  On-going      PT LONG TERM GOAL #2   Title  Pt will improve Lt shoulder AROM to; flexion 155, abduction 155, and ER 70 to demonstrate improved function    Status  On-going      PT LONG TERM GOAL #3   Title  Pt will be able to don her bra without limitations due to shoulder pain    Status  On-going      PT LONG TERM GOAL #4   Title  Pt will be independent with HEP for continued shoulder strength and ROM    Status  On-going      PT LONG TERM GOAL #5   Title  Pt will be knowledgeable about UE compression needs and how to obtain this if necessary    Status  On-going            Plan - 03/12/18 1026    Clinical Impression Statement  Focused on active mobility today with pt loving the UBE and making her shoulder  feel looser.  She had an easier time loosening up with active, active assisted, and MWM compared to just PROM today.  Able to tolerate all exercises well.  Continues with most pain into abudction around 90deg with a nerve like  pain.     PT Frequency  2x / week    PT Duration  8 weeks    PT Treatment/Interventions  Electrical Stimulation;Manual techniques;ADLs/Self Care Home Management;Therapeutic exercise;Patient/family education;Compression bandaging;Passive range of motion;Taping    PT Next Visit Plan  Lt shoulder taping?, continue Lt shoulder ROM and strengthening with scapulohumeral focus, PROM, can get into ULTT positions yet?       Patient will benefit from skilled therapeutic intervention in order to improve the following deficits and impairments:  Pain, Decreased mobility, Postural dysfunction, Decreased range of motion, Decreased strength, Impaired UE functional use, Decreased knowledge of precautions, Increased edema  Visit Diagnosis: Aftercare following surgery for neoplasm  Stiffness of left shoulder, not elsewhere classified  Acute pain of left shoulder  Abnormal posture     Problem List Patient Active Problem List   Diagnosis Date Noted  . Breast disorder   . Genetic testing 05/12/2017  . Family history of breast cancer   . Port catheter in place 04/28/2017  . Malignant neoplasm of overlapping sites of left breast in female, estrogen receptor negative ([place]) 03/05/2017  . Nontoxic multinodular goiter 12/25/2015  . Hyperlipidemia 12/25/2015  . Post-operative state 04/05/2014  . S/P subtotal hysterectomy 02/28/2014  . Chronic folliculitis 25/74/9355  . Annual physical exam 12/19/2013    Maria Hester 03/12/2018, 10:29 AM  [place], [place], [address] Phone: [phone]   Fax:  [phone]  Name: Maria Hester MRN: [id number] Date of Birth: [birth year]

## 2018-03-16 ENCOUNTER — Encounter: Payer: Self-pay | Admitting: Rehabilitation

## 2018-03-16 ENCOUNTER — Ambulatory Visit: Payer: 59 | Attending: Oncology | Admitting: Rehabilitation

## 2018-03-16 DIAGNOSIS — M25612 Stiffness of left shoulder, not elsewhere classified: Secondary | ICD-10-CM | POA: Insufficient documentation

## 2018-03-16 DIAGNOSIS — I890 Lymphedema, not elsewhere classified: Secondary | ICD-10-CM | POA: Insufficient documentation

## 2018-03-16 DIAGNOSIS — R293 Abnormal posture: Secondary | ICD-10-CM | POA: Diagnosis present

## 2018-03-16 DIAGNOSIS — Z483 Aftercare following surgery for neoplasm: Secondary | ICD-10-CM | POA: Diagnosis not present

## 2018-03-16 DIAGNOSIS — C50812 Malignant neoplasm of overlapping sites of left female breast: Secondary | ICD-10-CM | POA: Diagnosis present

## 2018-03-16 DIAGNOSIS — Z171 Estrogen receptor negative status [ER-]: Secondary | ICD-10-CM | POA: Insufficient documentation

## 2018-03-16 DIAGNOSIS — M25512 Pain in left shoulder: Secondary | ICD-10-CM | POA: Diagnosis present

## 2018-03-16 NOTE — Therapy (Signed)
[place], [place], [address] Phone: [phone]   Fax:  [phone]  Physical Therapy Treatment  Patient Details  Name: Maria Hester MRN: [id number] Date of Birth: 1966/09/25 Referring Provider: Lurline Del, MD   Encounter Date: 03/16/2018  PT End of Session - 03/16/18 1014    Visit Number  7    Number of Visits  16    Date for PT Re-Evaluation  04/13/18    PT Start Time  0932    PT Stop Time  1014    PT Time Calculation (min)  42 min    Activity Tolerance  Patient tolerated treatment well    Behavior During Therapy  [facility] for tasks assessed/performed       Past Medical History:  Diagnosis Date  . Breast cancer ([place])   . 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 Cataracts, bilateral   . Complication of anesthesia    blood pressure very low after surgery difficult to wake -1st surgery only-when pt was 52 y.o.  . Family history of breast cancer   . Gallbladder disease    "sludge in gallbladder",   . History of hiatal hernia   . Hyperlipidemia   . Uterine fibroid     Past Surgical History:  Procedure Laterality Date  . ABDOMINAL HYSTERECTOMY    . ABDOMINOPLASTY N/A 02/28/2014   Procedure: ABDOMINOPLASTY with Panniculectomy;  Surgeon: [name], MD;  [facility];  Service: Gynecology;  Laterality: N/A;  . APPENDECTOMY    . BILATERAL SALPINGECTOMY Bilateral 02/28/2014   Procedure: BILATERAL SALPINGECTOMY;  Surgeon: [name], MD;  [facility];  Service: Gynecology;  Laterality: Bilateral;  . BREAST LUMPECTOMY WITH RADIOACTIVE SEED AND SENTINEL LYMPH NODE BIOPSY Left 08/19/2017   Procedure: LEFT BREAST LUMPECTOMY WITH RADIOACTIVE SEED AND LEFT SENTINEL LYMPH NODE BIOPSY;  Surgeon: Excell Seltzer, MD;  Location: [facility] [place];  Service: General;  Laterality: Left;  . CATARACT EXTRACTION, BILATERAL    . CHOLECYSTECTOMY  N/A 08/19/2017   Procedure: LAPAROSCOPIC CHOLECYSTECTOMY WITH INTRAOPERATIVE CHOLANGIOGRAM;  Surgeon: Excell Seltzer, MD;  Location: [facility];  Service: General;  Laterality: N/A;  . PORTACATH PLACEMENT Right 03/18/2017   Procedure: INSERTION PORT-A-CATH;  Surgeon: Excell Seltzer, MD;  [facility];  Service: General;  Laterality: Right;  . reverse tubal ligation    . SCAR REVISION N/A 02/28/2014   Procedure: SCAR REVISION;  Surgeon: [name], MD;  [facility];  Service: Gynecology;  Laterality: N/A;  . SUPRACERVICAL ABDOMINAL HYSTERECTOMY N/A 02/28/2014   Procedure: HYSTERECTOMY SUPRACERVICAL ABDOMINAL;  Surgeon: [name], MD;  [facility];  Service: Gynecology;  Laterality: N/A;  . TUBAL LIGATION    . WISDOM TOOTH EXTRACTION      There were no vitals filed for this visit.  Subjective Assessment - 03/16/18 0934    Subjective  I am sore.  The tape was good and had no pain.  The Rt wrist had a painful pop the other day and it is still sore    Pertinent History  left lumpectomy and axillary lymph node dissection 08/19/2017 for a residual pT1a pN0 invasive ductal carcinoma, grade 2, with negative margins, repeat prognostic panel again estrogen and progesterone receptor negative, but HER-2 amplified.  Radiation completed,  chemotherapy consisting of carboplatin and Docetaxel given with trastuzumab and Pertuzumab every 21 days for 6 cycles, started 03/31/2017, completed 07/14/2017, history of  Rt pectoralis pull/tear at work 5 years ago that was still giving her trouble    Patient Stated Goals  improve the ROM of the Lt shoulder     Pain Score  1     Pain Location  Shoulder    Pain Orientation  Left    Pain Descriptors / Indicators  Sore    Pain Type  Acute pain                       [facility] Adult PT Treatment/Exercise - 03/16/18 0001      Shoulder Exercises: Supine   Protraction  Left;15 reps;Weights    Protraction Weight (lbs)  2#    Flexion   Left;Weights;15 reps    Shoulder Flexion Weight (lbs)  2#      Shoulder Exercises: Prone   Other Prone Exercises  prone extension and horizontal abduction 2# x 15 each with assist for Horizontal abduction      Shoulder Exercises: Sidelying   External Rotation  10 reps 2 sets    External Rotation Weight (lbs)  2      Shoulder Exercises: Pulleys   Flexion  3 minutes    Flexion Limitations  long holds    ABduction  3 minutes    ABduction Limitations  long holds      Shoulder Exercises: ROM/Strengthening   UBE (Upper Arm Bike)  level 2 29mn/3min both, Lt only 132m/1min      Kinesiotix   Create Space  Lt shoulder correction into retraction and ER with 1 Y tape from deltoid along supraspinatus and infraspinatus and 1 I strip anterior shoulder onto back around 70% pull                  PT Long Term Goals - 03/12/18 1028      PT LONG TERM GOAL #1   Title  Pt will be aware of lymphedema risk reduction practices    Status  On-going      PT LONG TERM GOAL #2   Title  Pt will improve Lt shoulder AROM to; flexion 155, abduction 155, and ER 70 to demonstrate improved function    Status  On-going      PT LONG TERM GOAL #3   Title  Pt will be able to don her bra without limitations due to shoulder pain    Status  On-going      PT LONG TERM GOAL #4   Title  Pt will be independent with HEP for continued shoulder strength and ROM    Status  On-going      PT LONG TERM GOAL #5   Title  Pt will be knowledgeable about UE compression needs and how to obtain this if necessary    Status  On-going            Plan - 03/16/18 1014    Clinical Impression Statement  Tolerated all well today.  Continued active mobility focus.  Reported good benfit from the tape.      PT Frequency  2x / week    PT Duration  8 weeks    PT Treatment/Interventions  Electrical Stimulation;Manual techniques;ADLs/Self Care Home Management;Therapeutic exercise;Patient/family education;Compression  bandaging;Passive range of motion;Taping    PT Next Visit Plan  Lt shoulder taping?, continue Lt shoulder ROM and strengthening with scapulohumeral focus, PROM, can get into ULTT positions yet?       Patient will benefit from skilled therapeutic intervention in order to improve  the following deficits and impairments:  Pain, Decreased mobility, Postural dysfunction, Decreased range of motion, Decreased strength, Impaired UE functional use, Decreased knowledge of precautions, Increased edema  Visit Diagnosis: Aftercare following surgery for neoplasm  Stiffness of left shoulder, not elsewhere classified  Acute pain of left shoulder  Abnormal posture     Problem List Patient Active Problem List   Diagnosis Date Noted  . Breast disorder   . Genetic testing 05/12/2017  . Family history of breast cancer   . Port catheter in place 04/28/2017  . Malignant neoplasm of overlapping sites of left breast in female, estrogen receptor negative ([place]) 03/05/2017  . Nontoxic multinodular goiter 12/25/2015  . Hyperlipidemia 12/25/2015  . Post-operative state 04/05/2014  . S/P subtotal hysterectomy 02/28/2014  . Chronic folliculitis 41/36/4383  . Annual physical exam 12/19/2013    Shan Levans, PT 03/16/2018, 10:15 AM  [place], [place], [address] Phone: [phone]   Fax:  [phone]  Name: Maria Hester MRN: [id number] Date of Birth: 09-13-1966

## 2018-03-19 ENCOUNTER — Ambulatory Visit: Payer: 59 | Admitting: Rehabilitation

## 2018-03-19 ENCOUNTER — Other Ambulatory Visit: Payer: 59 | Admitting: Adult Health

## 2018-03-19 ENCOUNTER — Encounter: Payer: Self-pay | Admitting: Rehabilitation

## 2018-03-19 DIAGNOSIS — M25612 Stiffness of left shoulder, not elsewhere classified: Secondary | ICD-10-CM

## 2018-03-19 DIAGNOSIS — M25512 Pain in left shoulder: Secondary | ICD-10-CM

## 2018-03-19 DIAGNOSIS — Z483 Aftercare following surgery for neoplasm: Secondary | ICD-10-CM | POA: Diagnosis not present

## 2018-03-19 NOTE — Therapy (Signed)
[place], [place], [address] Phone: [phone]   Fax:  [phone]  Physical Therapy Treatment  Patient Details  Name: Maria Hester MRN: [id number] Date of Birth: 10/11/1966 Referring Provider: Lurline Del, MD   Encounter Date: 03/19/2018  PT End of Session - 03/19/18 0931    Visit Number  8    Number of Visits  16    Date for PT Re-Evaluation  04/13/18    PT Start Time  0845    PT Stop Time  0930    PT Time Calculation (min)  45 min    Activity Tolerance  Patient tolerated treatment well    Behavior During Therapy  [facility] for tasks assessed/performed       Past Medical History:  Diagnosis Date  . Breast cancer ([place])   . 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 Cataracts, bilateral   . Complication of anesthesia    blood pressure very low after surgery difficult to wake -1st surgery only-when pt was 52 y.o.  . Family history of breast cancer   . Gallbladder disease    "sludge in gallbladder",   . History of hiatal hernia   . Hyperlipidemia   . Uterine fibroid     Past Surgical History:  Procedure Laterality Date  . ABDOMINAL HYSTERECTOMY    . ABDOMINOPLASTY N/A 02/28/2014   Procedure: ABDOMINOPLASTY with Panniculectomy;  Surgeon: [name], MD;  [facility];  Service: Gynecology;  Laterality: N/A;  . APPENDECTOMY    . BILATERAL SALPINGECTOMY Bilateral 02/28/2014   Procedure: BILATERAL SALPINGECTOMY;  Surgeon: [name], MD;  [facility];  Service: Gynecology;  Laterality: Bilateral;  . BREAST LUMPECTOMY WITH RADIOACTIVE SEED AND SENTINEL LYMPH NODE BIOPSY Left 08/19/2017   Procedure: LEFT BREAST LUMPECTOMY WITH RADIOACTIVE SEED AND LEFT SENTINEL LYMPH NODE BIOPSY;  Surgeon: Excell Seltzer, MD;  Location: [facility];  Service: General;  Laterality: Left;  . CATARACT EXTRACTION, BILATERAL    . CHOLECYSTECTOMY  N/A 08/19/2017   Procedure: LAPAROSCOPIC CHOLECYSTECTOMY WITH INTRAOPERATIVE CHOLANGIOGRAM;  Surgeon: Excell Seltzer, MD;  Location: [facility];  Service: General;  Laterality: N/A;  . PORTACATH PLACEMENT Right 03/18/2017   Procedure: INSERTION PORT-A-CATH;  Surgeon: Excell Seltzer, MD;  [facility];  Service: General;  Laterality: Right;  . reverse tubal ligation    . SCAR REVISION N/A 02/28/2014   Procedure: SCAR REVISION;  Surgeon: [name], MD;  [facility];  Service: Gynecology;  Laterality: N/A;  . SUPRACERVICAL ABDOMINAL HYSTERECTOMY N/A 02/28/2014   Procedure: HYSTERECTOMY SUPRACERVICAL ABDOMINAL;  Surgeon: [name], MD;  [facility];  Service: Gynecology;  Laterality: N/A;  . TUBAL LIGATION    . WISDOM TOOTH EXTRACTION      There were no vitals filed for this visit.  Subjective Assessment - 03/19/18 0852    Subjective  I think I need to get the wrist looked at.  It has really been hurting.  The surgeon was happy with my progress but said I may have to go to an orthopedist if it doesn't improve for a manipulation    Pertinent History  left lumpectomy and axillary lymph node dissection 08/19/2017 for a residual pT1a pN0 invasive ductal carcinoma, grade 2, with negative margins, repeat prognostic panel again estrogen and progesterone receptor negative, but HER-2 amplified.  Radiation completed,  chemotherapy consisting of carboplatin and Docetaxel given with trastuzumab and  Pertuzumab every 21 days for 6 cycles, started 03/31/2017, completed 07/14/2017, history of Rt pectoralis pull/tear at work 5 years ago that was still giving her trouble    Patient Stated Goals  improve the ROM of the Lt shoulder     Currently in Pain?  Yes    Pain Score  5     Pain Location  Wrist    Pain Orientation  Right    Pain Descriptors / Indicators  Aching    Pain Onset  1 to 4 weeks ago    Pain Frequency  Intermittent    Aggravating Factors   using the wrist    Pain Relieving  Factors  position and hot shower         [facility] PT Assessment - 03/19/18 0001      AROM   Left Shoulder Flexion  145 Degrees    Left Shoulder ABduction  106 Degrees    Left Shoulder External Rotation  45 Degrees                   [facility] Adult PT Treatment/Exercise - 03/19/18 0001      Shoulder Exercises: Pulleys   Flexion  3 minutes    Flexion Limitations  long holds    ABduction  3 minutes    ABduction Limitations  long holds      Shoulder Exercises: ROM/Strengthening   UBE (Upper Arm Bike)  level 2 32mn/3min      Manual Therapy   Joint Mobilization  AP glides with abduction and ER end range and inferior glides with flexion end range grade IV sustained and oscillating    Passive ROM  to tolerance focus on horizontal abduction, abduction, and ER at various angles including 90/90    Kinesiotex  [employer] Lt shoulder correction into retraction and ER with 1 Y tape from deltoid along supraspinatus and infraspinatus and 1 I strip anterior shoulder onto back around 70% pull                  PT Long Term Goals - 03/19/18 0933      PT LONG TERM GOAL #1   Title  Pt will be aware of lymphedema risk reduction practices    Status  On-going      PT LONG TERM GOAL #2   Title  Pt will improve Lt shoulder AROM to; flexion 155, abduction 155, and ER 70 to demonstrate improved function    Status  On-going      PT LONG TERM GOAL #3   Title  Pt will be able to don her bra without limitations due to shoulder pain    Status  On-going      PT LONG TERM GOAL #4   Title  Pt will be independent with HEP for continued shoulder strength and ROM    Status  On-going      PT LONG TERM GOAL #5   Title  Pt will be knowledgeable about UE compression needs and how to obtain this if necessary    Status  On-going            Plan - 03/19/18 0931    Clinical Impression Statement  showed excellent improvements wiht active flexion and  some improvements into abduction but not as dramatic.  ER remains the same.  Continues with neural type and muscular stiffness into horizontal abduction, ER, and abduction.  tolerated MT better today  PT Frequency  2x / week    PT Duration  8 weeks    PT Treatment/Interventions  Electrical Stimulation;Manual techniques;ADLs/Self Care Home Management;Therapeutic exercise;Patient/family education;Compression bandaging;Passive range of motion;Taping    PT Next Visit Plan  Lt shoulder taping?, continue Lt shoulder ROM and strengthening with scapulohumeral focus, PROM, can get into ULTT positions yet?    PT Home Exercise Plan  Access Code: [email or web address]       Patient will benefit from skilled therapeutic intervention in order to improve the following deficits and impairments:  Pain, Decreased mobility, Postural dysfunction, Decreased range of motion, Decreased strength, Impaired UE functional use, Decreased knowledge of precautions, Increased edema  Visit Diagnosis: Aftercare following surgery for neoplasm  Stiffness of left shoulder, not elsewhere classified  Acute pain of left shoulder     Problem List Patient Active Problem List   Diagnosis Date Noted  . Breast disorder   . Genetic testing 05/12/2017  . Family history of breast cancer   . Port catheter in place 04/28/2017  . Malignant neoplasm of overlapping sites of left breast in female, estrogen receptor negative ([place]) 03/05/2017  . Nontoxic multinodular goiter 12/25/2015  . Hyperlipidemia 12/25/2015  . Post-operative state 04/05/2014  . S/P subtotal hysterectomy 02/28/2014  . Chronic folliculitis 09/31/1216  . Annual physical exam 12/19/2013    Shan Levans, PT 03/19/2018, 9:33 AM  [place], [place], [address] Phone: [phone]   Fax:  [phone]  Name: Maria Hester MRN: [id number] Date of Birth: 06-Jul-1966

## 2018-03-25 ENCOUNTER — Encounter: Payer: Self-pay | Admitting: Physical Therapy

## 2018-03-25 ENCOUNTER — Ambulatory Visit: Payer: 59 | Admitting: Physical Therapy

## 2018-03-25 DIAGNOSIS — R293 Abnormal posture: Secondary | ICD-10-CM

## 2018-03-25 DIAGNOSIS — Z483 Aftercare following surgery for neoplasm: Secondary | ICD-10-CM

## 2018-03-25 DIAGNOSIS — M25512 Pain in left shoulder: Secondary | ICD-10-CM

## 2018-03-25 DIAGNOSIS — M25612 Stiffness of left shoulder, not elsewhere classified: Secondary | ICD-10-CM

## 2018-03-25 NOTE — Therapy (Signed)
[place], [place], [address] Phone: [phone]   Fax:  [phone]  Physical Therapy Treatment  Patient Details  Name: Maria Hester MRN: [id number] Date of Birth: June 05, 1966 Referring Provider: Lurline Del, MD   Encounter Date: 03/25/2018  PT End of Session - 03/25/18 1245    Visit Number  9    Number of Visits  16    Date for PT Re-Evaluation  04/13/18    PT Start Time  1100    PT Stop Time  1150    PT Time Calculation (min)  50 min    Activity Tolerance  Patient tolerated treatment well    Behavior During Therapy  [facility] for tasks assessed/performed       Past Medical History:  Diagnosis Date  . Breast cancer ([place])   . 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 Cataracts, bilateral   . Complication of anesthesia    blood pressure very low after surgery difficult to wake -1st surgery only-when pt was 52 y.o.  . Family history of breast cancer   . Gallbladder disease    "sludge in gallbladder",   . History of hiatal hernia   . Hyperlipidemia   . Uterine fibroid     Past Surgical History:  Procedure Laterality Date  . ABDOMINAL HYSTERECTOMY    . ABDOMINOPLASTY N/A 02/28/2014   Procedure: ABDOMINOPLASTY with Panniculectomy;  Surgeon: [name], MD;  [facility]: [facility];  Service: Gynecology;  Laterality: N/A;  . APPENDECTOMY    . BILATERAL SALPINGECTOMY Bilateral 02/28/2014   Procedure: BILATERAL SALPINGECTOMY;  Surgeon: [name], MD;  Location: [facility];  Service: Gynecology;  Laterality: Bilateral;  . BREAST LUMPECTOMY WITH RADIOACTIVE SEED AND SENTINEL LYMPH NODE BIOPSY Left 08/19/2017   Procedure: LEFT BREAST LUMPECTOMY WITH RADIOACTIVE SEED AND LEFT SENTINEL LYMPH NODE BIOPSY;  Surgeon: Excell Seltzer, MD;  Location: [place];  Service: General;  Laterality: Left;  . CATARACT EXTRACTION, BILATERAL    . CHOLECYSTECTOMY  N/A 08/19/2017   Procedure: LAPAROSCOPIC CHOLECYSTECTOMY WITH INTRAOPERATIVE CHOLANGIOGRAM;  Surgeon: Excell Seltzer, MD;  Location: [place];  Service: General;  Laterality: N/A;  . PORTACATH PLACEMENT Right 03/18/2017   Procedure: INSERTION PORT-A-CATH;  Surgeon: Excell Seltzer, MD;  [facility];  Service: General;  Laterality: Right;  . reverse tubal ligation    . SCAR REVISION N/A 02/28/2014   Procedure: SCAR REVISION;  Surgeon: [name], MD;  [facility];  Service: Gynecology;  Laterality: N/A;  . SUPRACERVICAL ABDOMINAL HYSTERECTOMY N/A 02/28/2014   Procedure: HYSTERECTOMY SUPRACERVICAL ABDOMINAL;  Surgeon: [name], MD;  [facility];  Service: Gynecology;  Laterality: N/A;  . TUBAL LIGATION    . WISDOM TOOTH EXTRACTION      There were no vitals filed for this visit.  Subjective Assessment - 03/25/18 1106    Subjective  Pt comes in wearing a splint on her right wrist and finds it is helping a bit.  She thinks the kinesiotape helps and she some at home.  She does not want to have it on today as she is going on vacation.  She has some kinesiotape at home and her husband will be able to help her put it on today.     Pertinent History  left lumpectomy and axillary lymph node dissection 08/19/2017 for a residual pT1a pN0 invasive ductal carcinoma, grade 2, with negative margins, repeat prognostic  panel again estrogen and progesterone receptor negative, but HER-2 amplified.  Radiation completed,  chemotherapy consisting of carboplatin and Docetaxel given with trastuzumab and Pertuzumab every 21 days for 6 cycles, started 03/31/2017, completed 07/14/2017, history of Rt pectoralis pull/tear at work 5 years ago that was still giving her trouble    Limitations  Other (comment)    Patient Stated Goals  improve the ROM of the Lt shoulder     Currently in Pain?  No/denies at rest  but does has pain with movment  dependeing on which way she moves                         [facility] Adult PT Treatment/Exercise - 03/25/18 0001      Self-Care   Self-Care  Other Self-Care Comments    Other Self-Care Comments   encourged pt to wear her compression bra and gave her a piece of white foam to add to her axilla       Exercises   Exercises  Neck      Neck Exercises: Seated   Other Seated Exercise  neck AROM  in flexion, extension and rotation with limitation in lateral flexion, extra time spent on right lateral flexion with extension of left arm to deepend stretch       Shoulder Exercises: Supine   Protraction  AROM;Left;10 reps      Shoulder Exercises: Sidelying   External Rotation  AROM;Left;10 reps towel roll at waist , pt with limited external roation range    Other Sidelying Exercises  hand pointed to ceiling, but pt not able to maintain position due to pain, so dropped it to about 45 degrees of abudciotn for small circles.       Manual Therapy   Manual Therapy  Scapular mobilization    Joint Mobilization  joint distraction in supine and sidelying     Soft tissue mobilization  in right sidelying with mix of massage cream and biofreeze for soft tissue work to very tight upper trap , cevical, posterior cervical and interscapular muscles with prolonged pressure on trigger points.     Scapular Mobilization  especilly inferior glides in sidelying to assist with neck stretching     Passive ROM  to tolerance focus on horizontal abduction, abduction, and ER at various angles including 90/90             PT Education - 03/25/18 1244    Education provided  Yes    Education Details  try compression bra with added foam near axilla to see if it will help with symptoms     Person(s) Educated  Patient    Methods  Explanation    Comprehension  Verbalized understanding          PT Long Term Goals - 03/19/18 0933      PT LONG TERM GOAL #1   Title  Pt will be aware of lymphedema risk reduction practices    Status  On-going       PT LONG TERM GOAL #2   Title  Pt will improve Lt shoulder AROM to; flexion 155, abduction 155, and ER 70 to demonstrate improved function    Status  On-going      PT LONG TERM GOAL #3   Title  Pt will be able to don her bra without limitations due to shoulder pain    Status  On-going      PT LONG TERM GOAL #4   Title  Pt will be independent with HEP for continued shoulder strength and ROM    Status  On-going      PT LONG TERM GOAL #5   Title  Pt will be knowledgeable about UE compression needs and how to obtain this if necessary    Status  On-going            Plan - 03/25/18 1245    Clinical Impression Statement  Pt is making steady gains, but still has tightness in upper quadrant and neck.  focused on soft tissue work in these areas today.  Pt reports she felt some relief after treatment     Rehab Potential  Excellent    PT Frequency  2x / week    PT Treatment/Interventions  Electrical Stimulation;Manual techniques;ADLs/Self Care Home Management;Therapeutic exercise;Patient/family education;Compression bandaging;Passive range of motion;Taping    PT Next Visit Plan  cointunue soft tissue work and neck stretching if pt found it helpful. Lt shoulder taping?, continue Lt shoulder ROM and strengthening with scapulohumeral focus, PROM, can get into ULTT positions yet?       Patient will benefit from skilled therapeutic intervention in order to improve the following deficits and impairments:  Pain, Decreased mobility, Postural dysfunction, Decreased range of motion, Decreased strength, Impaired UE functional use, Decreased knowledge of precautions, Increased edema  Visit Diagnosis: Aftercare following surgery for neoplasm  Acute pain of left shoulder  Stiffness of left shoulder, not elsewhere classified  Abnormal posture     Problem List Patient Active Problem List   Diagnosis Date Noted  . Breast disorder   . Genetic testing 05/12/2017  . Family history of breast  cancer   . Port catheter in place 04/28/2017  . Malignant neoplasm of overlapping sites of left breast in female, estrogen receptor negative ([place]) 03/05/2017  . Nontoxic multinodular goiter 12/25/2015  . Hyperlipidemia 12/25/2015  . Post-operative state 04/05/2014  . S/P subtotal hysterectomy 02/28/2014  . Chronic folliculitis 54/65/6812  . Annual physical exam 12/19/2013   Donato Heinz. Owens Shark PT  Norwood Levo 03/25/2018, 12:48 PM  [place], [place], [address] Phone: [phone]   Fax:  [phone]  Name: Maria Hester MRN: [id number] Date of Birth: 1966-10-06

## 2018-03-26 ENCOUNTER — Ambulatory Visit: Payer: 59 | Admitting: Physical Therapy

## 2018-03-26 ENCOUNTER — Encounter: Payer: Self-pay | Admitting: Physical Therapy

## 2018-03-26 DIAGNOSIS — M25612 Stiffness of left shoulder, not elsewhere classified: Secondary | ICD-10-CM

## 2018-03-26 DIAGNOSIS — R293 Abnormal posture: Secondary | ICD-10-CM

## 2018-03-26 DIAGNOSIS — M25512 Pain in left shoulder: Secondary | ICD-10-CM

## 2018-03-26 DIAGNOSIS — Z483 Aftercare following surgery for neoplasm: Secondary | ICD-10-CM | POA: Diagnosis not present

## 2018-03-26 NOTE — Therapy (Signed)
[place], [place], [address] Phone: [phone]   Fax:  [phone]  Physical Therapy Treatment  Patient Details  Name: Maria Hester MRN: [id number] Date of Birth: [birth year] Referring Provider: Lurline Del, MD   Encounter Date: 03/26/2018  PT End of Session - 03/26/18 1212    Visit Number  10    Number of Visits  16    Date for PT Re-Evaluation  04/13/18    PT Start Time  0845    PT Stop Time  0934    PT Time Calculation (min)  49 min    Activity Tolerance  Patient tolerated treatment well    Behavior During Therapy  [facility] for tasks assessed/performed       Past Medical History:  Diagnosis Date  . Breast cancer ([place])   . 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 Cataracts, bilateral   . Complication of anesthesia    blood pressure very low after surgery difficult to wake -1st surgery only-when pt was 51 y.o.  . Family history of breast cancer   . Gallbladder disease    "sludge in gallbladder",   . History of hiatal hernia   . Hyperlipidemia   . Uterine fibroid     Past Surgical History:  Procedure Laterality Date  . ABDOMINAL HYSTERECTOMY    . ABDOMINOPLASTY N/A 02/28/2014   Procedure: ABDOMINOPLASTY with Panniculectomy;  Surgeon: [name], MD;  [facility]: [facility];  Service: Gynecology;  Laterality: N/A;  . APPENDECTOMY    . BILATERAL SALPINGECTOMY Bilateral 02/28/2014   Procedure: BILATERAL SALPINGECTOMY;  Surgeon: [name], MD;  [facility];  Service: Gynecology;  Laterality: Bilateral;  . BREAST LUMPECTOMY WITH RADIOACTIVE SEED AND SENTINEL LYMPH NODE BIOPSY Left 08/19/2017   Procedure: LEFT BREAST LUMPECTOMY WITH RADIOACTIVE SEED AND LEFT SENTINEL LYMPH NODE BIOPSY;  Surgeon: Excell Seltzer, MD;  Location: [place];  Service: General;  Laterality: Left;  . CATARACT EXTRACTION, BILATERAL    .  CHOLECYSTECTOMY N/A 08/19/2017   Procedure: LAPAROSCOPIC CHOLECYSTECTOMY WITH INTRAOPERATIVE CHOLANGIOGRAM;  Surgeon: Excell Seltzer, MD;  Location: [place];  Service: General;  Laterality: N/A;  . PORTACATH PLACEMENT Right 03/18/2017   Procedure: INSERTION PORT-A-CATH;  Surgeon: Excell Seltzer, MD;  [facility];  Service: General;  Laterality: Right;  . reverse tubal ligation    . SCAR REVISION N/A 02/28/2014   Procedure: SCAR REVISION;  Surgeon: [name], MD;  [facility];  Service: Gynecology;  Laterality: N/A;  . SUPRACERVICAL ABDOMINAL HYSTERECTOMY N/A 02/28/2014   Procedure: HYSTERECTOMY SUPRACERVICAL ABDOMINAL;  Surgeon: [name], MD;  [facility];  Service: Gynecology;  Laterality: N/A;  . TUBAL LIGATION    . WISDOM TOOTH EXTRACTION      There were no vitals filed for this visit.  Subjective Assessment - 03/26/18 0847    Subjective  I was sore last night from the massage on my neck. My neck is more loose than it was yesterday though. I think the tape helps my shoulder but I did not put it on last night.     Pertinent History  left lumpectomy and axillary lymph node dissection 08/19/2017 for a residual pT1a pN0 invasive ductal carcinoma, grade 2, with negative margins, repeat prognostic panel again estrogen and progesterone receptor negative, but HER-2 amplified.  Radiation completed,  chemotherapy consisting of carboplatin and Docetaxel given with trastuzumab and Pertuzumab every 21  days for 6 cycles, started 03/31/2017, completed 07/14/2017, history of Rt pectoralis pull/tear at work 5 years ago that was still giving her trouble    Patient Stated Goals  improve the ROM of the Lt shoulder     Currently in Pain?  No/denies    Pain Score  0-No pain                       [facility] Adult PT Treatment/Exercise - 03/26/18 0001      Shoulder Exercises: Supine   Protraction  AROM;Left;10 reps      Shoulder Exercises: Sidelying   External Rotation   AROM;Left;10 reps towel roll at waist , pt with improved ROM today     Other Sidelying Exercises  hand pointed to ceiling, but able to maintain 90 degrees of abduction today without pain      Manual Therapy   Manual Therapy  Scapular mobilization    Soft tissue mobilization  in right sidelying with mix of massage cream and biofreeze for soft tissue work to very tight upper trap , cevical, posterior cervical and interscapular muscles with prolonged pressure on trigger points.     Scapular Mobilization  especilly inferior glides in sidelying to assist with neck stretching              PT Education - 03/25/18 1244    Education provided  Yes    Education Details  try compression bra with added foam near axilla to see if it will help with symptoms     Person(s) Educated  Patient    Methods  Explanation    Comprehension  Verbalized understanding          PT Long Term Goals - 03/19/18 0933      PT LONG TERM GOAL #1   Title  Pt will be aware of lymphedema risk reduction practices    Status  On-going      PT LONG TERM GOAL #2   Title  Pt will improve Lt shoulder AROM to; flexion 155, abduction 155, and ER 70 to demonstrate improved function    Status  On-going      PT LONG TERM GOAL #3   Title  Pt will be able to don her bra without limitations due to shoulder pain    Status  On-going      PT LONG TERM GOAL #4   Title  Pt will be independent with HEP for continued shoulder strength and ROM    Status  On-going      PT LONG TERM GOAL #5   Title  Pt will be knowledgeable about UE compression needs and how to obtain this if necessary    Status  On-going            Plan - 03/26/18 1213    Clinical Impression Statement  Pt made a lot of progress since last session. The massage helped decrease tightness and pt demonstrated increased ROM in R sidelying with L shoulder abduction and ER. Continued with massage today to decrease muslce tightness and encouraged pt to continue  with sidelying exercises at home.     Rehab Potential  Excellent    PT Frequency  2x / week    PT Duration  8 weeks    PT Treatment/Interventions  Electrical Stimulation;Manual techniques;ADLs/Self Care Home Management;Therapeutic exercise;Patient/family education;Compression bandaging;Passive range of motion;Taping    PT Next Visit Plan  cointunue soft tissue work and neck stretching if pt found it  helpful. Lt shoulder taping?, continue Lt shoulder ROM and strengthening with scapulohumeral focus, PROM, can get into ULTT positions yet?    PT Home Exercise Plan  Access Code: [email or web address]    Consulted and Agree with Plan of Care  Patient       Patient will benefit from skilled therapeutic intervention in order to improve the following deficits and impairments:  Pain, Decreased mobility, Postural dysfunction, Decreased range of motion, Decreased strength, Impaired UE functional use, Decreased knowledge of precautions, Increased edema  Visit Diagnosis: Acute pain of left shoulder  Stiffness of left shoulder, not elsewhere classified  Abnormal posture     Problem List Patient Active Problem List   Diagnosis Date Noted  . Breast disorder   . Genetic testing 05/12/2017  . Family history of breast cancer   . Port catheter in place 04/28/2017  . Malignant neoplasm of overlapping sites of left breast in female, estrogen receptor negative ([place]) 03/05/2017  . Nontoxic multinodular goiter 12/25/2015  . Hyperlipidemia 12/25/2015  . Post-operative state 04/05/2014  . S/P subtotal hysterectomy 02/28/2014  . Chronic folliculitis 47/06/6282  . Annual physical exam 12/19/2013    Allyson Sabal [facility] 03/26/2018, 12:15 PM  [place], [place], [address] Phone: [phone]   Fax:  [phone]  Name: Maria Hester MRN: [id number] Date of Birth: 04-Nov-1965  Manus Gunning, PT 03/26/18 12:16 PM

## 2018-04-06 ENCOUNTER — Encounter: Payer: Self-pay | Admitting: Adult Health

## 2018-04-06 ENCOUNTER — Ambulatory Visit: Payer: 59 | Admitting: Rehabilitation

## 2018-04-06 ENCOUNTER — Ambulatory Visit (INDEPENDENT_AMBULATORY_CARE_PROVIDER_SITE_OTHER): Payer: 59 | Admitting: Adult Health

## 2018-04-06 ENCOUNTER — Encounter: Payer: Self-pay | Admitting: Rehabilitation

## 2018-04-06 VITALS — BP 117/74 | HR 71 | Ht 65.0 in | Wt 199.5 lb

## 2018-04-06 DIAGNOSIS — Z171 Estrogen receptor negative status [ER-]: Secondary | ICD-10-CM

## 2018-04-06 DIAGNOSIS — Z1211 Encounter for screening for malignant neoplasm of colon: Secondary | ICD-10-CM | POA: Diagnosis not present

## 2018-04-06 DIAGNOSIS — Z853 Personal history of malignant neoplasm of breast: Secondary | ICD-10-CM

## 2018-04-06 DIAGNOSIS — Z1212 Encounter for screening for malignant neoplasm of rectum: Secondary | ICD-10-CM | POA: Diagnosis not present

## 2018-04-06 DIAGNOSIS — M25512 Pain in left shoulder: Secondary | ICD-10-CM

## 2018-04-06 DIAGNOSIS — Z01419 Encounter for gynecological examination (general) (routine) without abnormal findings: Secondary | ICD-10-CM | POA: Diagnosis not present

## 2018-04-06 DIAGNOSIS — Z483 Aftercare following surgery for neoplasm: Secondary | ICD-10-CM | POA: Diagnosis not present

## 2018-04-06 DIAGNOSIS — I890 Lymphedema, not elsewhere classified: Secondary | ICD-10-CM

## 2018-04-06 DIAGNOSIS — R293 Abnormal posture: Secondary | ICD-10-CM

## 2018-04-06 DIAGNOSIS — C50812 Malignant neoplasm of overlapping sites of left female breast: Secondary | ICD-10-CM

## 2018-04-06 DIAGNOSIS — M25612 Stiffness of left shoulder, not elsewhere classified: Secondary | ICD-10-CM

## 2018-04-06 LAB — HEMOCCULT GUIAC POC 1CARD (OFFICE): Fecal Occult Blood, POC: NEGATIVE

## 2018-04-06 NOTE — Progress Notes (Signed)
Patient ID: Maria Hester, female   DOB: 24-Oct-1965, 52 y.o.   MRN: [id number] History of Present Illness: Maria Hester is a 52 year old white female in for well woman gyn exam,she is sp +SCH and had normal pap with negative HPV 03/04/17.She had breast cancer last year, and was very happy with her care. PCP is Dr Margo Aye in [place] and she sees Dr Gwenlyn Perking.  Current Medications, Allergies, Past Medical History, Past Surgical History, Family History and Social History were reviewed in [employer] record.     Review of Systems: Patient denies any hearing loss, fatigue, blurred vision, shortness of breath, chest pain, abdominal pain, problems with bowel movements, urination, or intercourse. No joint pain or mood swings. Has frozen left shoulder and has some headaches with that.She is having PT.     Physical Exam:BP 117/74 (BP Location: Left Arm, Patient Position: Sitting, Cuff Size: Normal)   Pulse 71   Ht 5\' 5"  (1.651 m)   Wt 199 lb 8 oz (90.5 kg)   LMP 02/11/2014 Comment: [place]  BMI 33.20 kg/m  General:  Well developed, well nourished, no acute distress Skin:  Warm and dry Neck:  Midline trachea, normal thyroid, good ROM, no lymphadenopathy Lungs; Clear to auscultation bilaterally Breast:  No dominant palpable mass, retraction, or nipple discharge on right, has thickness at scar and left nipple is pink, no discharge Cardiovascular: Regular rate and rhythm Abdomen:  Soft, non tender, no hepatosplenomegaly Pelvic:  External genitalia is normal in appearance, no lesions.  The vagina is normal in appearance. Urethra has no lesions or masses. The cervix is bulbous.  Uterus is absent.  No adnexal masses or tenderness noted.Bladder is non tender, no masses felt. Rectal: Good sphincter tone, no polyps, or hemorrhoids felt.  Hemoccult negative. Extremities/musculoskeletal:  No swelling or varicosities noted, no clubbing or cyanosis Psych:  No mood changes, alert and  cooperative,seems happy PHQ 2 score 1.   Impression: 1. Encounter for well woman exam with routine gynecological exam   2. Screening for colorectal cancer   3. History of breast cancer       Plan: Physical in 1 year Pap in 2021 Referred to Dr Gala Romney for colonoscopy Mammogram per oncologist  Labs with PCP

## 2018-04-06 NOTE — Therapy (Signed)
[place], [place], [address] Phone: [phone]   Fax:  [phone]  Physical Therapy Treatment  Patient Details  Name: Maria Hester MRN: [id number] Date of Birth: Jul 24, 1966 Referring Provider: Lurline Del, MD   Encounter Date: 04/06/2018  PT End of Session - 04/06/18 1833    Visit Number  11    Number of Visits  16    Date for PT Re-Evaluation  04/13/18    PT Start Time  0932    PT Stop Time  1015    PT Time Calculation (min)  43 min    Activity Tolerance  Patient tolerated treatment well    Behavior During Therapy  [facility] for tasks assessed/performed       Past Medical History:  Diagnosis Date  . Breast cancer ([place])   . 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 Cataracts, bilateral   . Complication of anesthesia    blood pressure very low after surgery difficult to wake -1st surgery only-when pt was 52 y.o.  . Family history of breast cancer   . Frozen shoulder    left  . Gallbladder disease    "sludge in gallbladder",   . History of hiatal hernia   . Hyperlipidemia   . Uterine fibroid     Past Surgical History:  Procedure Laterality Date  . ABDOMINAL HYSTERECTOMY    . ABDOMINOPLASTY N/A 02/28/2014   Procedure: ABDOMINOPLASTY with Panniculectomy;  Surgeon: [name], MD;  [facility];  Service: Gynecology;  Laterality: N/A;  . APPENDECTOMY    . BILATERAL SALPINGECTOMY Bilateral 02/28/2014   Procedure: BILATERAL SALPINGECTOMY;  Surgeon: [name], MD;  [facility];  Service: Gynecology;  Laterality: Bilateral;  . BREAST LUMPECTOMY WITH RADIOACTIVE SEED AND SENTINEL LYMPH NODE BIOPSY Left 08/19/2017   Procedure: LEFT BREAST LUMPECTOMY WITH RADIOACTIVE SEED AND LEFT SENTINEL LYMPH NODE BIOPSY;  Surgeon: Excell Seltzer, MD;  Location: [place];  Service: General;  Laterality: Left;  . CATARACT EXTRACTION,  BILATERAL    . CHOLECYSTECTOMY N/A 08/19/2017   Procedure: LAPAROSCOPIC CHOLECYSTECTOMY WITH INTRAOPERATIVE CHOLANGIOGRAM;  Surgeon: Excell Seltzer, MD;  Location: [place];  Service: General;  Laterality: N/A;  . PORTACATH PLACEMENT Right 03/18/2017   Procedure: INSERTION PORT-A-CATH;  Surgeon: Excell Seltzer, MD;  [facility];  Service: General;  Laterality: Right;  . reverse tubal ligation    . SCAR REVISION N/A 02/28/2014   Procedure: SCAR REVISION;  Surgeon: [name], MD;  [facility];  Service: Gynecology;  Laterality: N/A;  . SUPRACERVICAL ABDOMINAL HYSTERECTOMY N/A 02/28/2014   Procedure: HYSTERECTOMY SUPRACERVICAL ABDOMINAL;  Surgeon: [name], MD;  [facility];  Service: Gynecology;  Laterality: N/A;  . TUBAL LIGATION    . WISDOM TOOTH EXTRACTION      There were no vitals filed for this visit.  Subjective Assessment - 04/06/18 0932    Subjective  I did something bad on vacation.  I stepped on something that gave way and it jerked my arm back and it hurt so bad it was crying.  other than that I have been doing really well.  I also had some muscle spasms putting by arm on the steering wheel.      Pertinent History  left lumpectomy and axillary lymph node dissection 08/19/2017 for a residual pT1a pN0 invasive ductal carcinoma, grade 2, with negative margins, repeat prognostic panel again estrogen  and progesterone receptor negative, but HER-2 amplified.  Radiation completed,  chemotherapy consisting of carboplatin and Docetaxel given with trastuzumab and Pertuzumab every 21 days for 6 cycles, started 03/31/2017, completed 07/14/2017, history of Rt pectoralis pull/tear at work 5 years ago that was still giving her trouble    Patient Stated Goals  improve the ROM of the Lt shoulder     Currently in Pain?  No/denies         [facility] PT Assessment - 04/06/18 0001      AROM   Left Shoulder Flexion  120 Degrees    Left Shoulder ABduction  100 Degrees                    [facility] Adult PT Treatment/Exercise - 04/06/18 0001      Shoulder Exercises: Pulleys   Flexion  3 minutes    Flexion Limitations  long holds    ABduction  3 minutes    ABduction Limitations  long holds      Shoulder Exercises: ROM/Strengthening   UBE (Upper Arm Bike)  level 2 3min/3min      Manual Therapy   Soft tissue mobilization  Lt upper trapezius, supraspinatus with trigger point release here.  Pain in arm reproduced with STM towards the insertion of the teres major/latissiums.  Gentle trigger point relase and STM here in supine and sidelying.      Passive ROM  to tolerance with more muscle guarding and pain today.  flexion, abduction, and ER focus.               PT Education - 04/06/18 1833    Education provided  Yes    Education Details  demonstrated standing teres major release with tennis ball for pt to trY AT HOME    Person(s) Educated  Patient    Methods  Explanation;Demonstration    Comprehension  Verbalized understanding;Returned demonstration          PT Long Term Goals - 03/19/18 0933      PT LONG TERM GOAL #1   Title  Pt will be aware of lymphedema risk reduction practices    Status  On-going      PT LONG TERM GOAL #2   Title  Pt will improve Lt shoulder AROM to; flexion 155, abduction 155, and ER 70 to demonstrate improved function    Status  On-going      PT LONG TERM GOAL #3   Title  Pt will be able to don her bra without limitations due to shoulder pain    Status  On-going      PT LONG TERM GOAL #4   Title  Pt will be independent with HEP for continued shoulder strength and ROM    Status  On-going      PT LONG TERM GOAL #5   Title  Pt will be knowledgeable about UE compression needs and how to obtain this if necessary    Status  On-going            Plan - 04/06/18 1834    Clinical Impression Statement  Pt with decreased AROM today especially into flexion with more guarding and pain and new reports of  muscle spasms.  Most likely due to injury of the UE when falling when camping this weekend.  Pain down the arm was reproduced today with teresmajor/latissiumus palpation near the axilla.      Rehab Potential  Excellent    PT Frequency  2x / week      PT Duration  8 weeks    PT Treatment/Interventions  Electrical Stimulation;Manual techniques;ADLs/Self Care Home Management;Therapeutic exercise;Patient/family education;Compression bandaging;Passive range of motion;Taping    PT Next Visit Plan  begin with Lt teresmajor/Lat release and STM with ROM as tolerated, review self ball or foam roll release, taping?, continue shoulder ROM    PT Home Exercise Plan  Access Code: [email or web address]; and self teres major release with ball    Consulted and Agree with Plan of Care  Patient       Patient will benefit from skilled therapeutic intervention in order to improve the following deficits and impairments:  Pain, Decreased mobility, Postural dysfunction, Decreased range of motion, Decreased strength, Impaired UE functional use, Decreased knowledge of precautions, Increased edema  Visit Diagnosis: Acute pain of left shoulder  Stiffness of left shoulder, not elsewhere classified  Abnormal posture  Aftercare following surgery for neoplasm  Lymphedema, not elsewhere classified  Carcinoma of overlapping sites of left breast in female, estrogen receptor negative (HCC)     Problem List Patient Active Problem List   Diagnosis Date Noted  . Encounter for well woman exam with routine gynecological exam 04/06/2018  . Screening for colorectal cancer 04/06/2018  . History of breast cancer 04/06/2018  . Breast disorder   . Genetic testing 05/12/2017  . Family history of breast cancer   . Port catheter in place 04/28/2017  . Malignant neoplasm of overlapping sites of left breast in female, estrogen receptor negative (HCC) 03/05/2017  . Nontoxic multinodular goiter 12/25/2015  . Hyperlipidemia 12/25/2015  .  Post-operative state 04/05/2014  . S/P subtotal hysterectomy 02/28/2014  . Chronic folliculitis 02/06/2014  . Annual physical exam 12/19/2013   Kara Tevis, PT 04/06/2018, 6:39 PM  [facility] [address] [place], [place], [address] Phone: [phone]   Fax:  [phone]  Name: Jenne M Rufener MRN: [id number] Date of Birth: 08/16/1966   

## 2018-04-09 ENCOUNTER — Encounter: Payer: Self-pay | Admitting: Rehabilitation

## 2018-04-09 ENCOUNTER — Ambulatory Visit: Payer: 59 | Admitting: Rehabilitation

## 2018-04-09 DIAGNOSIS — M25612 Stiffness of left shoulder, not elsewhere classified: Secondary | ICD-10-CM

## 2018-04-09 DIAGNOSIS — Z483 Aftercare following surgery for neoplasm: Secondary | ICD-10-CM | POA: Diagnosis not present

## 2018-04-09 DIAGNOSIS — M25512 Pain in left shoulder: Secondary | ICD-10-CM

## 2018-04-09 DIAGNOSIS — R293 Abnormal posture: Secondary | ICD-10-CM

## 2018-04-09 NOTE — Therapy (Signed)
[place], [place], [address] Phone: [phone]   Fax:  [phone]  Physical Therapy Treatment  Patient Details  Name: Maria Hester MRN: [id number] Date of Birth: 10-Jan-1966 Referring Provider: Lurline Del, MD   Encounter Date: 04/09/2018  PT End of Session - 04/09/18 1035    Visit Number  12    Number of Visits  16    Date for PT Re-Evaluation  04/13/18    PT Start Time  0932    PT Stop Time  1017    PT Time Calculation (min)  45 min    Activity Tolerance  Patient tolerated treatment well    Behavior During Therapy  [facility] for tasks assessed/performed       Past Medical History:  Diagnosis Date  . Breast cancer ([place])   . 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 Cataracts, bilateral   . Complication of anesthesia    blood pressure very low after surgery difficult to wake -1st surgery only-when pt was 52 y.o.  . Family history of breast cancer   . Frozen shoulder    left  . Gallbladder disease    "sludge in gallbladder",   . History of hiatal hernia   . Hyperlipidemia   . Uterine fibroid     Past Surgical History:  Procedure Laterality Date  . ABDOMINAL HYSTERECTOMY    . ABDOMINOPLASTY N/A 02/28/2014   Procedure: ABDOMINOPLASTY with Panniculectomy;  Surgeon: [name], MD;  [facility];  Service: Gynecology;  Laterality: N/A;  . APPENDECTOMY    . BILATERAL SALPINGECTOMY Bilateral 02/28/2014   Procedure: BILATERAL SALPINGECTOMY;  Surgeon: [name], MD;  [facility];  Service: Gynecology;  Laterality: Bilateral;  . BREAST LUMPECTOMY WITH RADIOACTIVE SEED AND SENTINEL LYMPH NODE BIOPSY Left 08/19/2017   Procedure: LEFT BREAST LUMPECTOMY WITH RADIOACTIVE SEED AND LEFT SENTINEL LYMPH NODE BIOPSY;  Surgeon: Excell Seltzer, MD;  Location: [place];  Service: General;  Laterality: Left;  . CATARACT EXTRACTION,  BILATERAL    . CHOLECYSTECTOMY N/A 08/19/2017   Procedure: LAPAROSCOPIC CHOLECYSTECTOMY WITH INTRAOPERATIVE CHOLANGIOGRAM;  Surgeon: Excell Seltzer, MD;  Location: [place];  Service: General;  Laterality: N/A;  . PORTACATH PLACEMENT Right 03/18/2017   Procedure: INSERTION PORT-A-CATH;  Surgeon: Excell Seltzer, MD;  [facility];  Service: General;  Laterality: Right;  . reverse tubal ligation    . SCAR REVISION N/A 02/28/2014   Procedure: SCAR REVISION;  Surgeon: [name], MD;  [facility];  Service: Gynecology;  Laterality: N/A;  . SUPRACERVICAL ABDOMINAL HYSTERECTOMY N/A 02/28/2014   Procedure: HYSTERECTOMY SUPRACERVICAL ABDOMINAL;  Surgeon: [name], MD;  [facility];  Service: Gynecology;  Laterality: N/A;  . TUBAL LIGATION    . WISDOM TOOTH EXTRACTION      There were no vitals filed for this visit.  Subjective Assessment - 04/09/18 0933    Subjective  It has still been angry.  I worked it in the pool yesterday and then it really hurt last night.  I had to start taking tylenol    Pertinent History  left lumpectomy and axillary lymph node dissection 08/19/2017 for a residual pT1a pN0 invasive ductal carcinoma, grade 2, with negative margins, repeat prognostic panel again estrogen and progesterone receptor negative, but HER-2 amplified.  Radiation completed,  chemotherapy consisting of carboplatin and Docetaxel given with trastuzumab and Pertuzumab every 21 days for 6  cycles, started 03/31/2017, completed 07/14/2017, history of Rt pectoralis pull/tear at work 5 years ago that was still giving her trouble    Pain Score  2     Pain Location  Shoulder    Pain Orientation  Left    Pain Descriptors / Indicators  Tightness    Pain Type  Acute pain    Pain Onset  1 to 4 weeks ago    Aggravating Factors   any movement now    Pain Relieving Factors  medication, position                       [facility] Adult PT Treatment/Exercise - 04/09/18 0001       Manual Therapy   Soft tissue mobilization  STM focus today to Lt upper trap, anterior shoulder, and teres major and latissimus near the axilla with release using sustained pressure and ER PROM holds, Rt sidelying to scapular muscles with shoulder PROM to tolerance all directions elbow bent and supported.  Then more release supine to latssimus more distal near the inferior scapular border towards the breast with pt experiencing full relief of shoulder pain and improved motions.  Held pressure here while patient performed ROM.  Self release with tennis ball here for home    Passive ROM  to tolerance with more muscle guarding and pain today.  flexion, abduction, and ER focus.                    PT Long Term Goals - 04/09/18 1052      PT LONG TERM GOAL #1   Title  Pt will be aware of lymphedema risk reduction practices    Status  On-going      PT LONG TERM GOAL #2   Title  Pt will improve Lt shoulder AROM to; flexion 155, abduction 155, and ER 70 to demonstrate improved function    Status  On-going      PT LONG TERM GOAL #3   Title  Pt will be able to don her bra without limitations due to shoulder pain    Status  On-going      PT LONG TERM GOAL #4   Title  Pt will be independent with HEP for continued shoulder strength and ROM    Status  On-going      PT LONG TERM GOAL #5   Title  Pt will be knowledgeable about UE compression needs and how to obtain this if necessary    Status  On-going            Plan - 04/09/18 1036    Clinical Impression Statement  Pt continues with muscle spasm / tightness after fall/incident camping this past weekend.  The ROM was still significantly limited by muscle guarding and pain but significantly improved with STM espeically release of Latissimus and teres major near the axilla and more so a bit more distally supine near the lateral inferior border of the scapula.  Pt educated on supine and wall self release here.  Pressure at this point  eliminated pain with AROM     PT Treatment/Interventions  Electrical Stimulation;Manual techniques;ADLs/Self Care Home Management;Therapeutic exercise;Patient/family education;Compression bandaging;Passive range of motion;Taping    PT Next Visit Plan  begin with Lt teresmajor/Lat release and STM with ROM as tolerated, review self ball or foam roll release, taping?, continue shoulder ROM    PT Home Exercise Plan  Access Code: [email or web address]; and self teres major release with ball  Patient will benefit from skilled therapeutic intervention in order to improve the following deficits and impairments:  Pain, Decreased mobility, Postural dysfunction, Decreased range of motion, Decreased strength, Impaired UE functional use, Decreased knowledge of precautions, Increased edema  Visit Diagnosis: Acute pain of left shoulder  Stiffness of left shoulder, not elsewhere classified  Abnormal posture  Aftercare following surgery for neoplasm     Problem List Patient Active Problem List   Diagnosis Date Noted  . Encounter for well woman exam with routine gynecological exam 04/06/2018  . Screening for colorectal cancer 04/06/2018  . History of breast cancer 04/06/2018  . Breast disorder   . Genetic testing 05/12/2017  . Family history of breast cancer   . Port catheter in place 04/28/2017  . Malignant neoplasm of overlapping sites of left breast in female, estrogen receptor negative ([place]) 03/05/2017  . Nontoxic multinodular goiter 12/25/2015  . Hyperlipidemia 12/25/2015  . Post-operative state 04/05/2014  . S/P subtotal hysterectomy 02/28/2014  . Chronic folliculitis 49/96/9249  . Annual physical exam 12/19/2013    Metztli Sachdev PT 04/09/2018, 10:53 AM  [place], [place], [address] Phone: [phone]   Fax:  [phone]  Name: Maria Hester MRN: [id number] Date of Birth: April 06, 1966

## 2018-04-13 ENCOUNTER — Ambulatory Visit: Payer: 59 | Attending: Oncology

## 2018-04-13 DIAGNOSIS — R293 Abnormal posture: Secondary | ICD-10-CM | POA: Diagnosis present

## 2018-04-13 DIAGNOSIS — Z483 Aftercare following surgery for neoplasm: Secondary | ICD-10-CM | POA: Diagnosis present

## 2018-04-13 DIAGNOSIS — M25612 Stiffness of left shoulder, not elsewhere classified: Secondary | ICD-10-CM | POA: Diagnosis present

## 2018-04-13 DIAGNOSIS — M25512 Pain in left shoulder: Secondary | ICD-10-CM | POA: Insufficient documentation

## 2018-04-13 NOTE — Therapy (Addendum)
[place], [place], [address] Phone: [phone]   Fax:  [phone]  Physical Therapy Treatment  Patient Details  Name: Maria Hester MRN: [id number] Date of Birth: [birth year] Referring Provider: Lurline Del, MD   Encounter Date: 04/13/2018  PT End of Session - 04/13/18 1022    Visit Number  13    Number of Visits  16    Date for PT Re-Evaluation  04/13/18    PT Start Time  0930    PT Stop Time  1020    PT Time Calculation (min)  50 min    Activity Tolerance  Patient tolerated treatment well    Behavior During Therapy  [facility] for tasks assessed/performed       Past Medical History:  Diagnosis Date  . Breast cancer ([place])   . 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 Cataracts, bilateral   . Complication of anesthesia    blood pressure very low after surgery difficult to wake -1st surgery only-when pt was 52 y.o.  . Family history of breast cancer   . Frozen shoulder    left  . Gallbladder disease    "sludge in gallbladder",   . History of hiatal hernia   . Hyperlipidemia   . Uterine fibroid     Past Surgical History:  Procedure Laterality Date  . ABDOMINAL HYSTERECTOMY    . ABDOMINOPLASTY N/A 02/28/2014   Procedure: ABDOMINOPLASTY with Panniculectomy;  Surgeon: [name], MD;  [facility];  Service: Gynecology;  Laterality: N/A;  . APPENDECTOMY    . BILATERAL SALPINGECTOMY Bilateral 02/28/2014   Procedure: BILATERAL SALPINGECTOMY;  Surgeon: [name], MD;  [facility];  Service: Gynecology;  Laterality: Bilateral;  . BREAST LUMPECTOMY WITH RADIOACTIVE SEED AND SENTINEL LYMPH NODE BIOPSY Left 08/19/2017   Procedure: LEFT BREAST LUMPECTOMY WITH RADIOACTIVE SEED AND LEFT SENTINEL LYMPH NODE BIOPSY;  Surgeon: Excell Seltzer, MD;  Location: [place];  Service: General;  Laterality: Left;  . CATARACT EXTRACTION,  BILATERAL    . CHOLECYSTECTOMY N/A 08/19/2017   Procedure: LAPAROSCOPIC CHOLECYSTECTOMY WITH INTRAOPERATIVE CHOLANGIOGRAM;  Surgeon: Excell Seltzer, MD;  Location: [place];  Service: General;  Laterality: N/A;  . PORTACATH PLACEMENT Right 03/18/2017   Procedure: INSERTION PORT-A-CATH;  Surgeon: Excell Seltzer, MD;  [facility];  Service: General;  Laterality: Right;  . reverse tubal ligation    . SCAR REVISION N/A 02/28/2014   Procedure: SCAR REVISION;  Surgeon: [name], MD;  [facility];  Service: Gynecology;  Laterality: N/A;  . SUPRACERVICAL ABDOMINAL HYSTERECTOMY N/A 02/28/2014   Procedure: HYSTERECTOMY SUPRACERVICAL ABDOMINAL;  Surgeon: [name], MD;  [facility];  Service: Gynecology;  Laterality: N/A;  . TUBAL LIGATION    . WISDOM TOOTH EXTRACTION      There were no vitals filed for this visit.      [facility] PT Assessment - 04/13/18 0001      AROM   Left Shoulder Flexion  119 Degrees    Left Shoulder ABduction  86 Degrees    Left Shoulder External Rotation  54 Degrees                   [facility] Adult PT Treatment/Exercise - 04/13/18 0001      Manual Therapy   Soft tissue mobilization  STM focus to posterior axilla with trigger point release, also used tennis ball for further  review for correct placement for pt and further trigger point release.     Passive ROM  to tolerance with more muscle guarding and pain today.  flexion, abduction, and ER focus.                    PT Long Term Goals - 04/13/18 0940      PT LONG TERM GOAL #1   Title  Pt will be aware of lymphedema risk reduction practices    Status  Achieved      PT LONG TERM GOAL #2   Title  Pt will improve Lt shoulder AROM to; flexion 155, abduction 155, and ER 70 to demonstrate improved function    Baseline  Lt shoulder flexion 119, abduction 86, and er 54 (pt had fall about 2 weeks ago causing decrease of ROM from what she previously had)-04/13/18    Status  Not  Met      PT LONG TERM GOAL #3   Title  Pt will be able to don her bra without limitations due to shoulder pain    Baseline  Pt reports not noticing any change with this, especially since her fall 2 weeks ago-04/13/18    Status  Not Met      PT LONG TERM GOAL #4   Title  Pt will be independent with HEP for continued shoulder strength and ROM    Status  Achieved      PT LONG TERM GOAL #5   Title  Pt will be knowledgeable about UE compression needs and how to obtain this if necessary    Baseline  Pt has a compression bra she sleeps in-04/13/18    Status  Achieved            Plan - 04/13/18 1022    Clinical Impression Statement  Discussed with pt progress towards goals thus far and even though she hasn't met all goals due to a set back due to fall a few weeks ago she feels ready to D/C with the understanding that with frozen shoulder it could be months before she has her full ROM back and she is expected to continue her HEP, especially for stretching. She had no questions about her HEP so continued to focus on manual therapy for trigger point release at posterior axilla, also with incoporating a tennis ball and suggesting she use her countertop or dining room table for deeper pressure as she reports she's been having trouble using the wall. Pt knows she can call us with any questions and can also contact her doctor if she feels needs to return to PT in future.    Rehab Potential  Excellent    PT Frequency  2x / week    PT Duration  8 weeks    PT Treatment/Interventions  Electrical Stimulation;Manual techniques;ADLs/Self Care Home Management;Therapeutic exercise;Patient/family education;Compression bandaging;Passive range of motion;Taping    PT Next Visit Plan  D/C this visit.     Consulted and Agree with Plan of Care  Patient       Patient will benefit from skilled therapeutic intervention in order to improve the following deficits and impairments:  Pain, Decreased mobility, Postural  dysfunction, Decreased range of motion, Decreased strength, Impaired UE functional use, Decreased knowledge of precautions, Increased edema  Visit Diagnosis: Acute pain of left shoulder  Stiffness of left shoulder, not elsewhere classified  Abnormal posture  Aftercare following surgery for neoplasm     Problem List Patient Active Problem List  Diagnosis Date Noted  . Encounter for well woman exam with routine gynecological exam 04/06/2018  . Screening for colorectal cancer 04/06/2018  . History of breast cancer 04/06/2018  . Breast disorder   . Genetic testing 05/12/2017  . Family history of breast cancer   . Port catheter in place 04/28/2017  . Malignant neoplasm of overlapping sites of left breast in female, estrogen receptor negative ([place] [facility]) 03/05/2017  . Nontoxic multinodular goiter 12/25/2015  . Hyperlipidemia 12/25/2015  . Post-operative state 04/05/2014  . S/P subtotal hysterectomy 02/28/2014  . Chronic folliculitis 88/08/314  . Annual physical exam 12/19/2013    Otelia Limes, PTA 04/13/2018, 12:21 PM  [place], [place], [address] Phone: [phone]   Fax:  [phone]  Name: MARLENI GALLARDO MRN: [id number] Date of Birth: 06-02-1966   PHYSICAL THERAPY DISCHARGE SUMMARY  Visits from Start of Care: 13  Current functional level related to goals / functional outcomes: See above   Remaining deficits: ROM limitations and pain   Education / Equipment: HEP for ROM Plan: Patient agrees to discharge.  Patient goals were partially met. Patient is being discharged due to the patient's request.  ?????        Shan Levans, PT

## 2018-04-16 ENCOUNTER — Encounter: Payer: 59 | Admitting: Physical Therapy

## 2018-04-16 ENCOUNTER — Encounter: Payer: Self-pay | Admitting: Internal Medicine

## 2018-04-20 ENCOUNTER — Ambulatory Visit: Payer: 59

## 2018-04-23 ENCOUNTER — Encounter: Payer: 59 | Admitting: Rehabilitation

## 2018-05-24 NOTE — Progress Notes (Signed)
[facility]  [phone] [phone]     ID: Maria Hester DOB: 12-14-1965  MR#: [id number]  TAV#:[id number]  Patient Care Team: Yvone Neu, MD as PCP - General (Family Medicine) Excell Seltzer, MD as Consulting Physician (General Surgery) Magrinat, Virgie Dad, MD as Consulting Physician (Oncology) Eppie Gibson, MD as Attending Physician (Radiation Oncology) Tommie Sams, MD as Referring Physician (Internal Medicine) OTHER MD:  CHIEF COMPLAINT: HER-2 positive estrogen receptor negative breast cancer  CURRENT TREATMENT: observation  INTERVAL HISTORY: Maria Hester returns today for a follow-up of her estrogen receptor negative but HER-2 positive breast cancer accompanied by her sister and husband. She continues under observation. She is doing well.    REVIEW OF SYSTEMS: Maria Hester was started on Crestor every other day by her cardiologist. She feels this medication makes her joints more stiff and painful compared to before she started. She also started having slight headaches. She continues to work full time, and she denies issues with memory.   For exercise, she started walking as of yesterday. She did yoga in the past, but this elevated the pain in her shoulders. She also started a new diet with decreasing carbs and eating more fruits and vegetables. She is also exercising portion control. She denies visual changes, nausea, vomiting, or dizziness. There has been no unusual cough, phlegm production, or pleurisy. There has been no change in bowel or bladder habits. She denies unexplained fatigue or unexplained weight loss, bleeding, rash, or fever. A detailed review of systems was otherwise stable.   BREAST CANCER HISTORY: From the original intake note:  Maria Hester herself noted a change in her left breast when looking into a mirror and then palpating a mass in the upper-outer quadrant. She brought this to medical attention and on 02/24/2017  underwent bilateral diagnostic mammography with tomography and left breast ultrasonography at the breast Center. The breast density was category C. In the upper portion of the left breast there was a broad area of distortion measuring up to 7.5 cm. On exam there was a broad firm area involving the upper outer quadrant of the left breast which was visibly protruding. By ultrasound at the 11:00 axis 8 cm from the nipple there was an irregular hypoechoic mass measuring 1.4 cm with additiona ases at 2:00, 3 cm from the nipple and multiple other masses as described, all in the upper-outer quadrant primarily. Ultrasound of the left axilla showed at least 3 prominent lymph nodes one of which had a cortical bulge.  On 03/02/2017 the patient underwent biopsy of a left breast mass at the 11:30 o'clock position and a second mass described as upper outer quadrant as well as one of the suspicious left axillary lymph nodes. These all showed invasive ductal carcinoma, grade 3. Prognostic panel from one of the 2 masses showed the tumor to be estrogen and progesterone receptor negative, but HER-2 amplified, with a signals ratio of 8.24 and the number per cell 15.65.  Her subsequent history is as detailed below   PAST MEDICAL HISTORY: Past Medical History:  Diagnosis Date  . Breast cancer ([place])   . 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 Cataracts, bilateral   . Complication of anesthesia    blood pressure very low after surgery difficult to wake -1st surgery only-when pt was 52 y.o.  . Family history of breast cancer   . Frozen shoulder    left  . Gallbladder disease    "  sludge in gallbladder",   . History of hiatal hernia   . Hyperlipidemia   . Uterine fibroid     PAST SURGICAL HISTORY: Past Surgical History:  Procedure Laterality Date  . ABDOMINAL HYSTERECTOMY    . ABDOMINOPLASTY N/A 02/28/2014   Procedure: ABDOMINOPLASTY with Panniculectomy;   Surgeon: [name], MD;  Location: [facility];  Service: Gynecology;  Laterality: N/A;  . APPENDECTOMY    . BILATERAL SALPINGECTOMY Bilateral 02/28/2014   Procedure: BILATERAL SALPINGECTOMY;  Surgeon: [name], MD;  Location: [facility];  Service: Gynecology;  Laterality: Bilateral;  . BREAST LUMPECTOMY WITH RADIOACTIVE SEED AND SENTINEL LYMPH NODE BIOPSY Left 08/19/2017   Procedure: LEFT BREAST LUMPECTOMY WITH RADIOACTIVE SEED AND LEFT SENTINEL LYMPH NODE BIOPSY;  Surgeon: Excell Seltzer, MD;  Location: [facility];  Service: General;  Laterality: Left;  . CATARACT EXTRACTION, BILATERAL    . CHOLECYSTECTOMY N/A 08/19/2017   Procedure: LAPAROSCOPIC CHOLECYSTECTOMY WITH INTRAOPERATIVE CHOLANGIOGRAM;  Surgeon: Excell Seltzer, MD;  Location: [facility];  Service: General;  Laterality: N/A;  . PORTACATH PLACEMENT Right 03/18/2017   Procedure: INSERTION PORT-A-CATH;  Surgeon: Excell Seltzer, MD;  Location: [facility];  Service: General;  Laterality: Right;  . reverse tubal ligation    . SCAR REVISION N/A 02/28/2014   Procedure: SCAR REVISION;  Surgeon: [name], MD;  Location: [facility];  Service: Gynecology;  Laterality: N/A;  . SUPRACERVICAL ABDOMINAL HYSTERECTOMY N/A 02/28/2014   Procedure: HYSTERECTOMY SUPRACERVICAL ABDOMINAL;  Surgeon: [name], MD;  Location: [facility];  Service: Gynecology;  Laterality: N/A;  . TUBAL LIGATION    . WISDOM TOOTH EXTRACTION      FAMILY HISTORY Family History  Problem Relation Age of Onset  . Heart disease Mother   . Hypertension Mother   . Diabetes Father   . Heart disease Father   . Heart disease Maternal Grandmother   . Colon cancer Maternal Grandmother 45       died in her 79's  . Heart disease Maternal Grandfather   . Esophageal cancer Maternal Grandfather 50       died at 12  . Diabetes Paternal Grandmother   . Heart disease Paternal Grandmother   . Kidney cancer Paternal Grandmother 37       died in her 19's  . Tuberculosis Paternal  Grandfather   . Heart disease Paternal Grandfather   . Skin cancer Paternal Grandfather 81       died at 6, patient not sure if it was melanoma, BCC, Squamous, etc.  . Breast cancer Maternal Aunt 50       died at 80  . Breast cancer Cousin 64       she is now in her 38's  . Rectal cancer Neg Hx   . Stomach cancer Neg Hx   The patient's father died from heart disease at age 64. The patient's mother is living at age 73. The patient has one brother, 2 sisters. On the mother's side and aunt had breast cancer at age 40 and her daughter had breast cancer in her early 23s. There is also a grandfather with esophageal cancer and a grandmother with colon cancer. On the paternal side there is a grandmother with kidney cancer at an early age and a grandfather with melanoma at an early age.  GYNECOLOGIC HISTORY:  Patient's last menstrual period was 02/11/2014.  Menarche age 49, first live birth age 28, the patient is Maria Hester. She underwent hysterectomy without salpingo-oophorectomy May 2016. She did not  use hormone replacement. She did use oral contraceptives approximately 10 years in the 1980s.  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 Her husband Fish farm manager in the same [place], which is a 10% aviation enrollment. Daughter Lorenda Hatchet is a hairstylist in [place] and son Chip Boer is a Physiological scientist in [place] the patient has 2 grandsons and one granddaughter. She attends a [employer]    ADVANCED DIRECTIVES: The patient has completed advanced directives and will have the motorized at the next visit   HEALTH MAINTENANCE: Social History   Tobacco Use  . Smoking status: Former Smoker    Packs/day: 0.50    Years: 34.00    Pack years: 17.00    Types: Cigarettes    Last attempt to quit: 10/13/2013    Years since quitting: 4.6  . Smokeless tobacco: Never Used  Substance Use Topics  . Alcohol use: No    Comment:  social drink   . Drug use: No     Colonoscopy:No  PAP: 03/04/2017  Bone density: No   No Known Allergies  Current Outpatient Medications  Medication Sig Dispense Refill  . ALPRAZolam (XANAX) 0.5 MG tablet TAKE HALF TO 1 TABLET BY MOUTH DAILY AS NEEDED FOR ANXIETY  2  . esomeprazole (NEXIUM) 40 MG capsule Take 1 capsule (40 mg total) by mouth 2 (two) times daily before a meal. (Patient taking differently: Take 40 mg by mouth daily. ) 180 capsule 3   No current facility-administered medications for this visit.    Facility-Administered Medications Ordered in Other Visits  Medication Dose Route Frequency Provider Last Rate Last Dose  . heparin lock flush 100 unit/mL  500 Units Intracatheter Once Magrinat, Virgie Dad, MD      . sodium chloride flush (NS) 0.9 % injection 10 mL  10 mL Intracatheter Once Magrinat, Virgie Dad, MD        OBJECTIVE:Middle-aged white woman who appears stated age  Vitals:   05/25/18 0920  BP: (!) 94/46  Pulse: (!) 51  Resp: 18  Temp: 98.1 F (36.7 C)  SpO2: 99%     Body mass index is 33.93 kg/m.    ECOG FS:1 - Symptomatic but completely ambulatory  Sclerae unicteric, pupils round and equal Oropharynx clear and moist No cervical or supraclavicular adenopathy Lungs no rales or rhonchi Heart regular rate and rhythm Abd soft, obese, nontender, positive bowel sounds MSK no focal spinal tenderness, no upper extremity lymphedema Neuro: nonfocal, well oriented, appropriate affect Breasts: Right breast is unremarkable.  The left breast status post lumpectomy and radiation.  There is minimal hyperpigmentation remaining.  The cosmetic result is good.  Both axillae are benign.  LAB RESULTS:  CMP     Component Value Date/Time   NA 142 12/23/2017 0845   NA 139 10/14/2017 1159   K 4.4 12/23/2017 0845   K 4.3 10/14/2017 1159   CL 106 12/23/2017 0845   CO2 29 12/23/2017 0845   CO2 25 10/14/2017 1159   GLUCOSE 104 12/23/2017 0845   GLUCOSE 92 10/14/2017 1159     BUN 16 12/23/2017 0845   BUN 15.9 10/14/2017 1159   CREATININE 0.75 12/23/2017 0845   CREATININE 0.7 10/14/2017 1159   CALCIUM 9.5 12/23/2017 0845   CALCIUM 9.3 10/14/2017 1159   PROT 6.8 12/23/2017 0845   PROT 6.8 10/14/2017 1159   ALBUMIN 3.4 (L) 12/23/2017 0845   ALBUMIN 3.6 10/14/2017 1159   AST 23 12/23/2017  0845   AST 20 10/14/2017 1159   ALT 21 12/23/2017 0845   ALT 23 10/14/2017 1159   ALKPHOS 85 12/23/2017 0845   ALKPHOS 83 10/14/2017 1159   BILITOT 0.4 12/23/2017 0845   BILITOT 0.23 10/14/2017 1159   GFRNONAA >60 12/23/2017 0845   GFRAA >60 12/23/2017 0845    No results found for: Ronnald Ramp, A1GS, A2GS, BETS, BETA2SER, GAMS, MSPIKE, SPEI  No results found for: Nils Pyle, [facility]  Lab Results  Component Value Date   WBC 4.6 05/25/2018   NEUTROABS 2.1 05/25/2018   HGB 12.0 05/25/2018   HCT 37.7 05/25/2018   MCV 90.0 05/25/2018   PLT 279 05/25/2018      Chemistry      Component Value Date/Time   NA 142 12/23/2017 0845   NA 139 10/14/2017 1159   K 4.4 12/23/2017 0845   K 4.3 10/14/2017 1159   CL 106 12/23/2017 0845   CO2 29 12/23/2017 0845   CO2 25 10/14/2017 1159   BUN 16 12/23/2017 0845   BUN 15.9 10/14/2017 1159   CREATININE 0.75 12/23/2017 0845   CREATININE 0.7 10/14/2017 1159      Component Value Date/Time   CALCIUM 9.5 12/23/2017 0845   CALCIUM 9.3 10/14/2017 1159   ALKPHOS 85 12/23/2017 0845   ALKPHOS 83 10/14/2017 1159   AST 23 12/23/2017 0845   AST 20 10/14/2017 1159   ALT 21 12/23/2017 0845   ALT 23 10/14/2017 1159   BILITOT 0.4 12/23/2017 0845   BILITOT 0.23 10/14/2017 1159       No results found for: LABCA2  No components found for: [id number]  No results for input(s): INR in the last 168 hours.  Urinalysis    Component Value Date/Time   COLORURINE YELLOW 02/23/2014 1004   APPEARANCEUR CLEAR 02/23/2014 1004   LABSPEC 1.025 02/23/2014 1004   PHURINE 5.5 02/23/2014 1004   GLUCOSEU  NEGATIVE 02/23/2014 1004   HGBUR NEGATIVE 02/23/2014 1004   BILIRUBINUR NEGATIVE 02/23/2014 1004   KETONESUR NEGATIVE 02/23/2014 1004   PROTEINUR NEGATIVE 02/23/2014 1004   UROBILINOGEN 0.2 02/23/2014 1004   NITRITE NEGATIVE 02/23/2014 1004   LEUKOCYTESUR NEGATIVE 02/23/2014 1004     STUDIES: Repeat mammography is due in November.  ELIGIBLE FOR AVAILABLE RESEARCH PROTOCOL: no  ASSESSMENT: 52 y.o. [place], [place] woman status post left breast overlapping sites biopsy 03/02/2017 for a clinical T3 N1-2, stage IIIA invasive ductal carcinoma, grade 3, estrogen and progesterone receptor negative, HER-2 amplified, with an MIB-1 of 35%  (1) genetics testing07/24/2018 through the Hereditary Gene Panel offered by [facility] found no deleterious mutations in APC, ATM, AXIN2, BARD1, BMPR1A, BRCA1, BRCA2, BRIP1, CDH1, CDKN2A (p14ARF), CDKN2A (p16INK4a), CHEK2, CTNNA1, DICER1, EPCAM (Deletion/duplication testing only), GREM1 (promoter region deletion/duplication testing only), KIT, MEN1, MLH1, MSH2, MSH3, MSH6, MUTYH, NBN, NF1, NHTL1, PALB2, PDGFRA, PMS2, POLD1, POLE, PTEN, RAD50, RAD51C, RAD51D, SDHB, SDHC, SDHD, SMAD4, SMARCA4. STK11, TP53, TSC1, TSC2, and VHL.  The following genes were evaluated for sequence changes only: SDHA and HOXB13 c.251G>A variant only.  (2) neoadjuvant chemotherapy consisting of carboplatin and Docetaxel given with trastuzumab and Pertuzumab every 21 days for 6 cycles, started 03/31/2017, completed 07/14/2017  (a) Pertuzumab stopped after cycle 1 secondary to side effects     (b) Pertuzumab resumed cycle 3, not at loading dose.  (3) trastuzumab continued to complete 6 months (final dose 09/22/2017)  (a) echocardiogram 03/20/2017 showed an ejection fraction of 55%  (b) echocardiogram 06/30/2017 shows an ejection fraction of 55%  (c)  echocardiogram 09/30/2017 shows an ejection fraction of 55-60% range   (4) status post left lumpectomy and axillary lymph node dissection 08/19/2017  for a residual ypT1a ypN0 invasive ductal carcinoma, grade 2, with negative margins, repeat prognostic panel again estrogen and progesterone receptor negative, but HER-2 amplified  (5) adjuvant radiation 09/22/17 - 11/05/17 Site/dose:   Left breast treated to 50 Gy with 25 fx of 2 Gy                       Left supraclavicular region treated to 45 Gy with 25 fx of 1.8 Gy                      Left breast boost of 10 Gy with 5 fx of 2 Gy    PLAN: Alliah is now a little over half a year out from definitive surgery for her breast cancer.  She is still recovering from her treatments and in particular frozen shoulder continues to be a major issue.  She is also fighting her cholesterol medicine, which is not agreeing well with her.  She will discuss that with her cardiologist.  She is interested in losing weight and today we discussed diet and exercise issues.  I strongly suggest that she start a tai chi program as well as a cardio program.  She also needs of course to cut down on calories.  I am going to see her again early December.  I will probably start seeing her on a yearly basis from that point.  She knows to call for any other issues that may develop before then.   Magrinat, Virgie Dad, MD  05/25/18 9:43 AM Medical Oncology and Hematology [facility] [address] [place], [place] [address] Tel. [phone]    Fax. [phone]  Alice Rieger, am acting as scribe for Chauncey Cruel MD.  I, Lurline Del MD, have reviewed the above documentation for accuracy and completeness, and I agree with the above.

## 2018-05-25 ENCOUNTER — Inpatient Hospital Stay (HOSPITAL_BASED_OUTPATIENT_CLINIC_OR_DEPARTMENT_OTHER): Payer: 59 | Admitting: Oncology

## 2018-05-25 ENCOUNTER — Inpatient Hospital Stay: Payer: 59 | Attending: Oncology

## 2018-05-25 ENCOUNTER — Telehealth: Payer: Self-pay | Admitting: Oncology

## 2018-05-25 VITALS — BP 94/46 | HR 51 | Temp 98.1°F | Resp 18 | Ht 65.0 in | Wt 203.9 lb

## 2018-05-25 DIAGNOSIS — C50812 Malignant neoplasm of overlapping sites of left female breast: Secondary | ICD-10-CM | POA: Diagnosis present

## 2018-05-25 DIAGNOSIS — Z8051 Family history of malignant neoplasm of kidney: Secondary | ICD-10-CM

## 2018-05-25 DIAGNOSIS — Z87891 Personal history of nicotine dependence: Secondary | ICD-10-CM | POA: Diagnosis not present

## 2018-05-25 DIAGNOSIS — Z79899 Other long term (current) drug therapy: Secondary | ICD-10-CM | POA: Diagnosis not present

## 2018-05-25 DIAGNOSIS — Z803 Family history of malignant neoplasm of breast: Secondary | ICD-10-CM | POA: Diagnosis not present

## 2018-05-25 DIAGNOSIS — Z800 Family history of malignant neoplasm of digestive organs: Secondary | ICD-10-CM | POA: Diagnosis not present

## 2018-05-25 DIAGNOSIS — Z171 Estrogen receptor negative status [ER-]: Secondary | ICD-10-CM | POA: Diagnosis not present

## 2018-05-25 LAB — CBC WITH DIFFERENTIAL/PLATELET
BASOS PCT: 1 %
Basophils Absolute: 0 10*3/uL (ref 0.0–0.1)
Eosinophils Absolute: 0.1 10*3/uL (ref 0.0–0.5)
Eosinophils Relative: 2 %
HEMATOCRIT: 37.7 % (ref 34.8–46.6)
HEMOGLOBIN: 12 g/dL (ref 11.6–15.9)
LYMPHS ABS: 2 10*3/uL (ref 0.9–3.3)
Lymphocytes Relative: 42 %
MCH: 28.6 pg (ref 25.1–34.0)
MCHC: 31.8 g/dL (ref 31.5–36.0)
MCV: 90 fL (ref 79.5–101.0)
MONOS PCT: 10 %
Monocytes Absolute: 0.5 10*3/uL (ref 0.1–0.9)
NEUTROS ABS: 2.1 10*3/uL (ref 1.5–6.5)
NEUTROS PCT: 45 %
Platelets: 279 10*3/uL (ref 145–400)
RBC: 4.19 MIL/uL (ref 3.70–5.45)
RDW: 14.1 % (ref 11.2–14.5)
WBC: 4.6 10*3/uL (ref 3.9–10.3)

## 2018-05-25 LAB — COMPREHENSIVE METABOLIC PANEL
ALT: 20 U/L (ref 0–44)
ANION GAP: 11 (ref 5–15)
AST: 19 U/L (ref 15–41)
Albumin: 3.7 g/dL (ref 3.5–5.0)
Alkaline Phosphatase: 97 U/L (ref 38–126)
BUN: 18 mg/dL (ref 6–20)
CALCIUM: 9.2 mg/dL (ref 8.9–10.3)
CHLORIDE: 106 mmol/L (ref 98–111)
CO2: 25 mmol/L (ref 22–32)
Creatinine, Ser: 0.77 mg/dL (ref 0.44–1.00)
GFR calc non Af Amer: >60 mL/min (ref >60)
Glucose, Bld: 96 mg/dL (ref 70–99)
Potassium: 4.9 mmol/L (ref 3.5–5.1)
SODIUM: 142 mmol/L (ref 135–145)
Total Bilirubin: 0.4 mg/dL (ref 0.3–1.2)
Total Protein: 7 g/dL (ref 6.5–8.1)

## 2018-05-25 NOTE — Telephone Encounter (Signed)
Gave patient avs report and appointments for December  °

## 2018-05-28 ENCOUNTER — Telehealth: Payer: Self-pay | Admitting: *Deleted

## 2018-05-28 NOTE — Telephone Encounter (Signed)
yes

## 2018-05-28 NOTE — Telephone Encounter (Signed)
Thanks- will proceed as scheduled  

## 2018-05-28 NOTE — Telephone Encounter (Signed)
Dr Hilarie Fredrickson,  Maria Hester is scheduled for a PV 06-10-18 with a direct screening colon 06-24-18, no GI  Hx- she was dx'd last yr with Breast Cancer- - she had Chemo 03-31-2017 thru 07-14-2017 and then additional chemo thru 09-22-2017- she had radiation 09-22-17 thru 11-05-2017.  She had a lumpectomy 08-19-2017.  Is she okay to have her scheduled colon 06-24-18?  Please Advise - Thanks for your time, [name]

## 2018-06-10 ENCOUNTER — Ambulatory Visit (AMBULATORY_SURGERY_CENTER): Payer: Self-pay

## 2018-06-10 VITALS — Ht 65.5 in | Wt 195.8 lb

## 2018-06-10 DIAGNOSIS — Z1211 Encounter for screening for malignant neoplasm of colon: Secondary | ICD-10-CM

## 2018-06-10 MED ORDER — NA SULFATE-K SULFATE-MG SULF 17.5-3.13-1.6 GM/177ML PO SOLN: 1.0000 | Freq: Once | ORAL | 0 refills | Status: AC

## 2018-06-10 NOTE — Progress Notes (Signed)
Per pt, no allergies to soy or egg products.Pt not taking any weight loss meds or using  O2 at home.  Pt refused emmi video. 

## 2018-06-24 ENCOUNTER — Encounter: Payer: Self-pay | Admitting: Internal Medicine

## 2018-06-24 ENCOUNTER — Ambulatory Visit (AMBULATORY_SURGERY_CENTER): Payer: 59 | Admitting: Internal Medicine

## 2018-06-24 VITALS — BP 101/45 | HR 40 | Temp 98.0°F | Resp 9 | Ht 65.0 in | Wt 195.0 lb

## 2018-06-24 DIAGNOSIS — D123 Benign neoplasm of transverse colon: Secondary | ICD-10-CM | POA: Diagnosis not present

## 2018-06-24 DIAGNOSIS — Z1211 Encounter for screening for malignant neoplasm of colon: Secondary | ICD-10-CM

## 2018-06-24 MED ORDER — SODIUM CHLORIDE 0.9 % IV SOLN: 500.0000 mL | Freq: Once | INTRAVENOUS | Status: DC

## 2018-06-24 NOTE — Op Note (Signed)
[facility] Patient Name: Maria Hester Procedure Date: 06/24/2018 11:04 AM MRN: [id number] Endoscopist: Jerene Bears , MD Age: 52 Referring MD:  Date of Birth: Feb 24, 1966 Gender: Female Account #: [id number] Procedure:                Colonoscopy Indications:              Screening for colorectal malignant neoplasm, This                            is the patient's first colonoscopy Medicines:                Monitored Anesthesia Care Procedure:                Pre-Anesthesia Assessment:                           - Prior to the procedure, a History and Physical                            was performed, and patient medications and                            allergies were reviewed. The patient's tolerance of                            previous anesthesia was also reviewed. The risks                            and benefits of the procedure and the sedation                            options and risks were discussed with the patient.                            All questions were answered, and informed consent                            was obtained. Prior Anticoagulants: The patient has                            taken no previous anticoagulant or antiplatelet                            agents. ASA Grade Assessment: II - A patient with                            mild systemic disease. After reviewing the risks                            and benefits, the patient was deemed in                            satisfactory condition to undergo the procedure.  After obtaining informed consent, the colonoscope                            was passed under direct vision. Throughout the                            procedure, the patient's blood pressure, pulse, and                            oxygen saturations were monitored continuously. The                            Colonoscope was introduced through the anus and                            advanced to the cecum,  identified by appendiceal                            orifice and ileocecal valve. The patient tolerated                            the procedure well. The quality of the bowel                            preparation was good. The ileocecal valve,                            appendiceal orifice, and rectum were photographed. Scope In: 11:14:51 AM Scope Out: 11:32:10 AM Scope Withdrawal Time: 0 hours 11 minutes 51 seconds  Total Procedure Duration: 0 hours 17 minutes 19 seconds  Findings:                 A 5 mm polyp was found in the transverse colon. The                            polyp was sessile. The polyp was removed with a                            cold snare. Resection and retrieval were complete.                           The exam was otherwise without abnormality on                            direct and retroflexion views. Complications:            No immediate complications. Estimated Blood Loss:     Estimated blood loss was minimal. Impression:               - One 5 mm polyp in the transverse colon, removed                            with a cold snare. Resected and retrieved.                           -  The examination was otherwise normal on direct                            and retroflexion views. Recommendation:           - Patient has a contact number available for                            emergencies. The signs and symptoms of potential                            delayed complications were discussed with the                            patient. Return to normal activities tomorrow.                            Written discharge instructions were provided to the                            patient.                           - Resume previous diet.                           - Continue present medications.                           - Await pathology results.                           - Repeat colonoscopy is recommended. The                            colonoscopy date will be  determined after pathology                            results from today's exam become available for                            review. Jerene Bears, MD 06/24/2018 11:34:41 AM This report has been signed electronically.

## 2018-06-24 NOTE — Patient Instructions (Addendum)
  Thank you for allowing [place] to care for you today!  Resume previous diet and medications.  Return to normal activities tomorrow.  Await pathology results by mail. Approx 2 weeks.  Next colonoscopy will be determined after pathology is returned.  Handout given for polyps.

## 2018-06-24 NOTE — Progress Notes (Signed)
Pt's states no medical or surgical changes since previsit or office visit. 

## 2018-06-24 NOTE — Progress Notes (Signed)
To PACU, VSS. Report to [email or web address].tb 

## 2018-06-24 NOTE — Progress Notes (Signed)
Called to room to assist during endoscopic procedure.  Patient ID and intended procedure confirmed with present staff. Received instructions for my participation in the procedure from the performing physician.  

## 2018-06-25 ENCOUNTER — Telehealth: Payer: Self-pay

## 2018-06-25 NOTE — Telephone Encounter (Signed)
Return call attempted as requested. No answer. Plan discussion with patient should they return call to Population Health general line of [phone], option 1.

## 2018-06-25 NOTE — Telephone Encounter (Signed)
Patient returned phone call stating that she is feeing fine and not having any problems.

## 2018-06-25 NOTE — Telephone Encounter (Signed)
Attempted to reach pt. With follow-up call following endoscopic procedure 06/24/2018.  LM on pt. Voice mail.  Will try to reach pt. Again later today.

## 2018-06-29 ENCOUNTER — Encounter: Payer: Self-pay | Admitting: Internal Medicine

## 2018-08-12 ENCOUNTER — Other Ambulatory Visit: Payer: Self-pay | Admitting: Adult Health

## 2018-08-12 ENCOUNTER — Ambulatory Visit
Admission: RE | Admit: 2018-08-12 | Discharge: 2018-08-12 | Disposition: A | Discharge status: Home / Self Care | Payer: 59 | Source: Ambulatory Visit | Attending: Oncology | Admitting: Oncology

## 2018-08-12 ENCOUNTER — Other Ambulatory Visit: Payer: Self-pay | Admitting: Oncology

## 2018-08-12 DIAGNOSIS — Z171 Estrogen receptor negative status [ER-]: Principal | ICD-10-CM

## 2018-08-12 DIAGNOSIS — C50812 Malignant neoplasm of overlapping sites of left female breast: Secondary | ICD-10-CM

## 2018-08-12 HISTORY — DX: Personal history of irradiation: Z92.3

## 2018-08-12 HISTORY — DX: Personal history of antineoplastic chemotherapy: Z92.21

## 2018-08-16 ENCOUNTER — Ambulatory Visit
Admission: RE | Admit: 2018-08-16 | Discharge: 2018-08-16 | Disposition: A | Discharge status: Home / Self Care | Payer: 59 | Source: Ambulatory Visit | Attending: Adult Health | Admitting: Adult Health

## 2018-08-16 ENCOUNTER — Ambulatory Visit
Admission: RE | Admit: 2018-08-16 | Discharge: 2018-08-16 | Disposition: A | Discharge status: Home / Self Care | Payer: 59 | Source: Ambulatory Visit | Attending: Oncology | Admitting: Oncology

## 2018-08-16 ENCOUNTER — Other Ambulatory Visit: Payer: Self-pay | Admitting: Adult Health

## 2018-08-16 DIAGNOSIS — C50812 Malignant neoplasm of overlapping sites of left female breast: Secondary | ICD-10-CM

## 2018-08-16 DIAGNOSIS — Z171 Estrogen receptor negative status [ER-]: Principal | ICD-10-CM

## 2018-08-16 HISTORY — PX: BREAST BIOPSY: SHX20

## 2018-08-23 ENCOUNTER — Telehealth: Payer: Self-pay

## 2018-08-23 ENCOUNTER — Other Ambulatory Visit: Payer: Self-pay | Admitting: Adult Health

## 2018-08-23 ENCOUNTER — Other Ambulatory Visit: Payer: Self-pay | Admitting: Oncology

## 2018-08-23 DIAGNOSIS — C50812 Malignant neoplasm of overlapping sites of left female breast: Secondary | ICD-10-CM

## 2018-08-23 DIAGNOSIS — Z171 Estrogen receptor negative status [ER-]: Principal | ICD-10-CM

## 2018-08-23 NOTE — Telephone Encounter (Signed)
LVM for patient to return call about appt.

## 2018-08-23 NOTE — Telephone Encounter (Signed)
-----   Message from Gardenia Phlegm, NP sent at 08/23/2018  3:54 PM EST ----- Regarding: RE: Bx results I ordered MRI and reviewed with Dr. Jana Hakim.  We will move her appointment back to after the MRI.    Thanks!  [name] ----- Message ----- From: Bary Leriche, RN Sent: 08/23/2018   1:36 PM EST To: Excell Seltzer, MD, Chauncey Cruel, MD, # Subject: Bx results                                     Hi team,  Dr. Rosana Hoes wanted me to let you know the following about her biopsy:  Pathology revealed DENSE FIBROSIS WITH CHRONIC INFLAMMATION AND FOCAL EPITHELIAL ATYPIA of the LEFT breast, upper outer. The atypia present is most likely related to prior treatment.  Dr. Melina Copa felt this atypia was related to her radiation.  Additionally, given the overall findings and patient history, recommend bilateral breast MRI in 1 month for further evaluation and to allow time for the biopsy sites to heal.  The patient is aware of this recommendation.  We will get her MRI scheduled once the orders are in.  Dr. Jana Hakim, the patient said she is scheduled to see you on 09-13-18.  Thanks,  Sula Soda

## 2018-08-23 NOTE — Progress Notes (Signed)
Maria Hester studies/biopsies were benign, but there remains a concern so we have set her up for breast MRI and I will move my visit with her back until those results are available

## 2018-09-08 ENCOUNTER — Other Ambulatory Visit: Payer: 59

## 2018-09-13 ENCOUNTER — Ambulatory Visit: Payer: 59 | Admitting: Oncology

## 2018-09-13 ENCOUNTER — Other Ambulatory Visit: Payer: 59

## 2018-09-30 ENCOUNTER — Ambulatory Visit
Admission: RE | Admit: 2018-09-30 | Discharge: 2018-09-30 | Disposition: A | Discharge status: Home / Self Care | Payer: 59 | Source: Ambulatory Visit | Attending: Adult Health | Admitting: Adult Health

## 2018-09-30 DIAGNOSIS — C50812 Malignant neoplasm of overlapping sites of left female breast: Secondary | ICD-10-CM

## 2018-09-30 DIAGNOSIS — Z171 Estrogen receptor negative status [ER-]: Principal | ICD-10-CM

## 2018-09-30 MED ORDER — GADOBUTROL 1 MMOL/ML IV SOLN
9.0000 mL | Freq: Once | INTRAVENOUS | Status: AC | PRN
  Administered 2018-09-30: 9 mL via INTRAVENOUS

## 2018-10-03 NOTE — Progress Notes (Signed)
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 She is accompanied by her husband.  The patient continues under observation.   Since her last visit here, she underwent a digital diagnostic bilateral mammogram with tomography on 08/12/2018 showing Breast Density Category C. Lumpectomy changes are seen in the upper-outer quadrant of the left breast. Spot magnification views of the lumpectomy site show residual scattered calcifications spanning an area of 4.3 cm. 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 No mass is seen in the left breast.   There are very faint calcifications in the upper-outer quadrant of the right breast spanning an area of 2.3 cm. They are indeterminate. There is no mass in the right breast.  Accordingly on 08/16/2018 she proceeded to biopsy of the left and right breast areas in question. The pathology from this procedure showed ([id number]): Dense fibrosis with chronic inflammation and focal epithelial atypia are found in the left breast. In the right breast, sclerosing adenosis and fibrocystic changes with calcifications are found. No malignancy is identified in either breast.   Finally, on 09/30/2018 she underwent a bilateral  breast MRI with and without contrast showing Breast Density Category C. There is no MRI evidence of malignancy in either breast. Marked parenchymal enhancement in the right breast, with no significant change appreciated compared to prior MRI of June 2018. This marked enhancement pattern decreases sensitivity for detecting malignancy. There are lumpectomy and radiation changes in the left breast.   REVIEW OF SYSTEMS: Maria Hester has been doing well overall. She has not been exercising regularly. 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 She has not been watching what she eats recently due to the holidays, but normally they eat rather healthy; they usually grill their meats and limit their read meat intake. The patient denies unusual headaches, visual changes, nausea, vomiting, or dizziness. There has been no unusual cough, phlegm production, or pleurisy. This been no change in bowel or bladder habits. The patient denies unexplained fatigue or unexplained weight loss, bleeding, rash, or fever. A detailed review of systems was otherwise noncontributory.    BREAST CANCER HISTORY: From the original intake note:  Maria Hester herself noted a change in her left breast when looking into a mirror and then palpating a mass in the upper-outer quadrant. She brought this to medical attention and on 02/24/2017 underwent bilateral diagnostic mammography with tomography and left breast ultrasonography at the breast Center. The breast density was category C. In the upper portion of the left breast there was a broad area of distortion measuring up to 7.5 cm. On exam there was a broad firm area involving the upper outer quadrant of the left breast which was visibly protruding. By ultrasound at the 11:00 axis 8 cm from the nipple there was an irregular hypoechoic mass measuring 1.4 cm with  additiona ases at 2:00, 3 cm from the nipple and multiple other masses as described, all in  the upper-outer quadrant primarily. Ultrasound of the left axilla showed at least 3 prominent lymph nodes one of which had a cortical bulge.  On 03/02/2017 the patient underwent biopsy of a left breast mass at the 11:30 o'clock position and a second mass described as upper outer quadrant as well as one of the suspicious left axillary lymph nodes. These all showed invasive ductal carcinoma, grade 3. Prognostic panel from one of the 2 masses showed the tumor to be estrogen and progesterone receptor negative, but HER-2 amplified, with a signals ratio of 8.24 and the number per cell 15.65.  Her subsequent history is as detailed below   PAST MEDICAL HISTORY: Past Medical History:  Diagnosis Date  . Breast cancer ([place]) 2018   Last chemo 0ct 2018, last radiation Jan. 2019  . 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 Cataracts, bilateral    bil cataracts removed  . Complication of anesthesia    blood pressure very low after surgery difficult to wake -1st surgery only-when pt was 52 y.o.  . Family history of breast cancer   . Frozen shoulder    left  . Gallbladder disease    "sludge in gallbladder",   . Hiatal hernia   . History of hiatal hernia   . Hx of thyroid nodule    benign  . Hyperlipidemia   . Personal history of chemotherapy   . Personal history of radiation therapy   . Thyroid disease    Thyroid nodules - bx was negative  . Uterine fibroid     PAST SURGICAL HISTORY: Past Surgical History:  Procedure Laterality Date  . ABDOMINAL HYSTERECTOMY    . ABDOMINOPLASTY N/A 02/28/2014   Procedure: ABDOMINOPLASTY with Panniculectomy;  Surgeon: [name], MD;  Location: [facility];  Service: Gynecology;  Laterality: N/A;  . APPENDECTOMY    . BILATERAL SALPINGECTOMY Bilateral 02/28/2014   Procedure: BILATERAL SALPINGECTOMY;  Surgeon: [name], MD;  Location: [facility];  Service: Gynecology;  Laterality: Bilateral;  . BREAST  LUMPECTOMY    . BREAST LUMPECTOMY WITH RADIOACTIVE SEED AND SENTINEL LYMPH NODE BIOPSY Left 08/19/2017   Procedure: LEFT BREAST LUMPECTOMY WITH RADIOACTIVE SEED AND LEFT SENTINEL LYMPH NODE BIOPSY;  Surgeon: Excell Seltzer, MD;  Location: [facility];  Service: General;  Laterality: Left;  . CATARACT EXTRACTION, BILATERAL    . CHOLECYSTECTOMY N/A 08/19/2017   Procedure: LAPAROSCOPIC CHOLECYSTECTOMY WITH INTRAOPERATIVE CHOLANGIOGRAM;  Surgeon: Excell Seltzer, MD;  Location: [place];  Service: General;  Laterality: N/A;  . PORTACATH PLACEMENT Right 03/18/2017   Procedure: INSERTION PORT-A-CATH;  Surgeon: Excell Seltzer, MD;  Location: [facility];  Service: General;  Laterality: Right;  . removal portacath  08/2017  . reverse tubal ligation    . SCAR REVISION N/A 02/28/2014   Procedure: SCAR REVISION;  Surgeon: [name], MD;  Location: [facility];  Service: Gynecology;  Laterality: N/A;  . SUPRACERVICAL ABDOMINAL HYSTERECTOMY N/A 02/28/2014   Procedure: HYSTERECTOMY SUPRACERVICAL ABDOMINAL;  Surgeon: [name], MD;  Location: [facility];  Service: Gynecology;  Laterality: N/A;  . TUBAL LIGATION    . WISDOM TOOTH EXTRACTION      FAMILY HISTORY Family History  Problem Relation Age of Onset  . Heart disease Mother   . Hypertension Mother   . Diabetes Father   . Heart disease Father   .  Heart disease Maternal Grandmother   . Colon cancer Maternal Grandmother 47       died in her 28's  . Heart disease Maternal Grandfather   . Esophageal cancer Maternal Grandfather 50       died at 76  . Diabetes Paternal Grandmother   . Heart disease Paternal Grandmother   . Kidney cancer Paternal Grandmother 59       died in her 59's  . Tuberculosis Paternal Grandfather   . Heart disease Paternal Grandfather   . Skin cancer Paternal Grandfather 65       died at 50, patient not sure if it was melanoma, BCC, Squamous, etc.  . Breast cancer Maternal Aunt 50       died at 34  . Breast cancer Cousin 52        she is now in her 57's  . Rectal cancer Neg Hx   . Stomach cancer Neg Hx    The patient's father died from heart disease at age 84. The patient's mother is living at age 77. The patient has one brother, 2 sisters. On the mother's side and aunt had breast cancer at age 68 and her daughter had breast cancer in her early 40s. There is also a grandfather with esophageal cancer and a grandmother with colon cancer. On the paternal side there is a grandmother with kidney cancer at an early age and a grandfather with melanoma at an early age.   GYNECOLOGIC HISTORY:  Patient's last menstrual period was 02/11/2014.  Menarche age 54, first live birth age 64, the patient is [place] P2. She underwent hysterectomy without salpingo-oophorectomy May 2016. She did not use hormone replacement. She did use oral contraceptives approximately 10 years in the 1980s.   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  She has a second job for [employer] so basically she is currently weight working from 8:30 in the morning to 10 PM in the evening.  Her husband Darnelle Maffucci is Surveyor, mining in the same [place], which has a 10% aviation enrollment. Daughter Lorenda Hatchet is a hairstylist in [place] and son Chip Boer is a Physiological scientist in [place] the patient has 2 grandsons and one granddaughter. She attends a [employer]   ADVANCED DIRECTIVES: The patient has completed advanced directives and will have them notarized   HEALTH MAINTENANCE: Social History   Tobacco Use  . Smoking status: Former Smoker    Packs/day: 0.50    Years: 34.00    Pack years: 17.00    Types: Cigarettes    Last attempt to quit: 10/13/2013    Years since quitting: 4.9  . Smokeless tobacco: Never Used  Substance Use Topics  . Alcohol use: No    Frequency: Never  . Drug use: No    Colonoscopy:No  PAP: 03/04/2017  Bone density: No   No Known Allergies  Current Outpatient Medications   Medication Sig Dispense Refill  . ALPRAZolam (XANAX) 0.5 MG tablet TAKE HALF TO 1 TABLET BY MOUTH DAILY AS NEEDED FOR ANXIETY  2  . esomeprazole (NEXIUM) 40 MG capsule Take 1 capsule (40 mg total) by mouth 2 (two) times daily before a meal. (Patient taking differently: Take 40 mg by mouth daily. ) 180 capsule 3  . rosuvastatin (CRESTOR) 5 MG tablet Take 5 mg by mouth daily.     No current facility-administered medications for this visit.    Facility-Administered Medications Ordered in Other Visits  Medication  Dose Route Frequency Provider Last Rate Last Dose  . heparin lock flush 100 unit/mL  500 Units Intracatheter Once Magrinat, Virgie Dad, MD      . sodium chloride flush (NS) 0.9 % injection 10 mL  10 mL Intracatheter Once Magrinat, Virgie Dad, MD        OBJECTIVE: Middle-aged white woman in no acute distress  Vitals:   10/04/18 0927 10/04/18 0928  BP: (!) 115/48 114/71  Pulse: (!) 51   Resp: 17   Temp: 98.2 F (36.8 C)   SpO2: 99%      Body mass index is 34.3 kg/m.     ECOG FS:1 - Symptomatic but completely ambulatory  Sclerae unicteric, EOMs intact Oropharynx clear and moist No cervical or supraclavicular adenopathy Lungs no rales or rhonchi Heart regular rate and rhythm Abd soft, nontender, positive bowel sounds MSK no focal spinal tenderness, no upper extremity lymphedema Neuro: nonfocal, well oriented, appropriate affect Breasts: The right breast is benign.  The left breast has undergone lumpectomy and radiation.  There is no evidence of local recurrence.  Both axillae are benign.  LAB RESULTS:  CMP     Component Value Date/Time   NA 142 05/25/2018 0901   NA 139 10/14/2017 1159   K 4.9 05/25/2018 0901   K 4.3 10/14/2017 1159   CL 106 05/25/2018 0901   CO2 25 05/25/2018 0901   CO2 25 10/14/2017 1159   GLUCOSE 96 05/25/2018 0901   GLUCOSE 92 10/14/2017 1159   BUN 18 05/25/2018 0901   BUN 15.9 10/14/2017 1159   CREATININE 0.77 05/25/2018 0901   CREATININE 0.7  10/14/2017 1159   CALCIUM 9.2 05/25/2018 0901   CALCIUM 9.3 10/14/2017 1159   PROT 7.0 05/25/2018 0901   PROT 6.8 10/14/2017 1159   ALBUMIN 3.7 05/25/2018 0901   ALBUMIN 3.6 10/14/2017 1159   AST 19 05/25/2018 0901   AST 20 10/14/2017 1159   ALT 20 05/25/2018 0901   ALT 23 10/14/2017 1159   ALKPHOS 97 05/25/2018 0901   ALKPHOS 83 10/14/2017 1159   BILITOT 0.4 05/25/2018 0901   BILITOT 0.23 10/14/2017 1159   GFRNONAA >60 05/25/2018 0901   GFRAA >60 05/25/2018 0901    No results found for: TOTALPROTELP, ALBUMINELP, A1GS, A2GS, BETS, BETA2SER, GAMS, MSPIKE, SPEI  No results found for: Nils Pyle, [facility]  Lab Results  Component Value Date   WBC 5.7 10/04/2018   NEUTROABS 2.8 10/04/2018   HGB 12.3 10/04/2018   HCT 39.1 10/04/2018   MCV 90.7 10/04/2018   PLT 286 10/04/2018      Chemistry      Component Value Date/Time   NA 142 05/25/2018 0901   NA 139 10/14/2017 1159   K 4.9 05/25/2018 0901   K 4.3 10/14/2017 1159   CL 106 05/25/2018 0901   CO2 25 05/25/2018 0901   CO2 25 10/14/2017 1159   BUN 18 05/25/2018 0901   BUN 15.9 10/14/2017 1159   CREATININE 0.77 05/25/2018 0901   CREATININE 0.7 10/14/2017 1159      Component Value Date/Time   CALCIUM 9.2 05/25/2018 0901   CALCIUM 9.3 10/14/2017 1159   ALKPHOS 97 05/25/2018 0901   ALKPHOS 83 10/14/2017 1159   AST 19 05/25/2018 0901   AST 20 10/14/2017 1159   ALT 20 05/25/2018 0901   ALT 23 10/14/2017 1159   BILITOT 0.4 05/25/2018 0901   BILITOT 0.23 10/14/2017 1159       No results found for: LABCA2  No components  found for: [id number]  No results for input(s): INR in the last 168 hours.  Urinalysis    Component Value Date/Time   COLORURINE YELLOW 02/23/2014 1004   APPEARANCEUR CLEAR 02/23/2014 1004   LABSPEC 1.025 02/23/2014 1004   PHURINE 5.5 02/23/2014 1004   GLUCOSEU NEGATIVE 02/23/2014 1004   HGBUR NEGATIVE 02/23/2014 1004   BILIRUBINUR NEGATIVE 02/23/2014 1004   KETONESUR  NEGATIVE 02/23/2014 1004   PROTEINUR NEGATIVE 02/23/2014 1004   UROBILINOGEN 0.2 02/23/2014 1004   NITRITE NEGATIVE 02/23/2014 1004   LEUKOCYTESUR NEGATIVE 02/23/2014 1004     STUDIES: Mr Breast Bilateral W Wo Contrast Inc Cad  Result Date: 09/30/2018 CLINICAL DATA:  52 year old patient with history left breast cancer status post lumpectomy (November 2018), radiation therapy and chemotherapy. She had bilateral stereotactic biopsies in November 2019 with benign findings showing dense fibrosis with chronic inflammation and focal epithelial atypia of the left breast upper outer, with atypia most likely related to prior treatment. Pathology in the right breast showed sclerosing adenosis and fibrocystic changes with calcifications. Breast MRI was suggested following biopsies. LABS:  None obtained. EXAM: BILATERAL BREAST MRI WITH AND WITHOUT CONTRAST TECHNIQUE: Multiplanar, multisequence MR images of both breasts were obtained prior to and following the intravenous administration of 9 ml of Gadavist. Three-dimensional MR images were rendered by post-processing of the original MR data on an independent workstation. The three-dimensional MR images were interpreted, and findings are reported in the following complete MRI report for this study. Three dimensional images were evaluated at the independent DynaCad workstation COMPARISON:  Previous exam(s), including previous breast MRI July 15, 2017. FINDINGS: Breast composition: c. Heterogeneous fibroglandular tissue. Background parenchymal enhancement: Marked on the right and moderate on the left. Right breast: Marked nodular parenchymal enhancement pattern in the right breast, similar to breast MRI of June 2018. This parenchymal enhancement limits the sensitivity for detecting malignancy. No suspicious enhancement above background parenchymal enhancement or mass to suggest malignancy. Left breast: Decreased enhancement of the left breast compared to the right and  diffuse mild skin thickening. Both of these findings are consistent with interval radiation therapy. There are lumpectomy changes in the upper central/slightly outer left breast. No mass or suspicious enhancement. Lymph nodes: No abnormal appearing lymph nodes. Ancillary findings: Postsurgical changes in the inferior left axilla. Negative for left axillary lymphadenopathy. Negative for right axillary lymphadenopathy. IMPRESSION: No MRI evidence of malignancy in either breast. Marked parenchymal enhancement in the right breast, with no significant change appreciated compared to prior MRI of June 2018. This marked enhancement pattern decreases sensitivity for detecting malignancy. Lumpectomy and radiation changes in the left breast. RECOMMENDATION: Bilateral diagnostic mammogram is recommended in October of 2020. BI-RADS CATEGORY  2: Benign. Electronically Signed   By: Curlene Dolphin M.D.   On: 09/30/2018 13:03    ELIGIBLE FOR AVAILABLE RESEARCH PROTOCOL: no  ASSESSMENT: 52 y.o. [place], [place] woman status post left breast overlapping sites biopsy 03/02/2017 for a clinical T3 N1-2, stage IIIA invasive ductal carcinoma, grade 3, estrogen and progesterone receptor negative, HER-2 amplified, with an MIB-1 of 35%  (1) genetics testing07/24/2018 through the Hereditary Gene Panel offered by [facility] found no deleterious mutations in APC, ATM, AXIN2, BARD1, BMPR1A, BRCA1, BRCA2, BRIP1, CDH1, CDKN2A (p14ARF), CDKN2A (p16INK4a), CHEK2, CTNNA1, DICER1, EPCAM (Deletion/duplication testing only), GREM1 (promoter region deletion/duplication testing only), KIT, MEN1, MLH1, MSH2, MSH3, MSH6, MUTYH, NBN, NF1, NHTL1, PALB2, PDGFRA, PMS2, POLD1, POLE, PTEN, RAD50, RAD51C, RAD51D, SDHB, SDHC, SDHD, SMAD4, SMARCA4. STK11, TP53, TSC1, TSC2, and VHL.  The  following genes were evaluated for sequence changes only: SDHA and HOXB13 c.251G>A variant only.  (2) neoadjuvant chemotherapy consisting of carboplatin and Docetaxel given with  trastuzumab and Pertuzumab every 21 days for 6 cycles, started 03/31/2017, completed 07/14/2017  (a) Pertuzumab stopped after cycle 1 secondary to side effects     (b) Pertuzumab resumed cycle 3, not at loading dose.  (3) trastuzumab continued to complete 6 months (final dose 09/22/2017)  (a) echocardiogram 03/20/2017 showed an ejection fraction of 55%  (b) echocardiogram 06/30/2017 shows an ejection fraction of 55%  (c) echocardiogram 09/30/2017 shows an ejection fraction of 55-60% range   (4) status post left lumpectomy and axillary lymph node dissection 08/19/2017 for a residual ypT1a ypN0 invasive ductal carcinoma, grade 2, with negative margins, repeat prognostic panel again estrogen and progesterone receptor negative, but HER-2 amplified  (5) adjuvant radiation 09/22/17 - 11/05/17 Site/dose:   Left breast treated to 50 Gy with 25 fx of 2 Gy                       Left supraclavicular region treated to 45 Gy with 25 fx of 1.8 Gy                      Left breast boost of 10 Gy with 5 fx of 2 Gy   PLAN: Lanette is now just over a year out from definitive surgery for her breast cancer with no evidence of disease recurrence.  This is very favorable.  She feels the recent work-up on biopsy was "overkill".  This is a very healthy attitude.  Nevertheless she does have difficulty examined breasts and I think doing a mammogram in July or June and MRI of the breast in December for the next year or 2 is probably prudent.  We did review her pathology and MRI results which are very favorable, as well as the lab results today.  We also discussed diet issues and exercise scheduling.  She already does have a stand-up desk.  She is simply going to have to find some time during the week and during the weekend to exercise more  She knows to call for any problems that may develop before her next visit here which will be in July.   Magrinat, Virgie Dad, MD  10/04/18 9:48 AM Medical Oncology and  Hematology [facility] [address] [place], [place] [address] Tel. [phone]    Fax. [phone]   I, Jacqualyn Posey am acting as a Education administrator for Chauncey Cruel, MD.   I, Lurline Del MD, have reviewed the above documentation for accuracy and completeness, and I agree with the above.

## 2018-10-04 ENCOUNTER — Telehealth: Payer: Self-pay | Admitting: Oncology

## 2018-10-04 ENCOUNTER — Inpatient Hospital Stay: Payer: 59 | Attending: Oncology

## 2018-10-04 ENCOUNTER — Inpatient Hospital Stay (HOSPITAL_BASED_OUTPATIENT_CLINIC_OR_DEPARTMENT_OTHER): Payer: 59 | Admitting: Oncology

## 2018-10-04 VITALS — BP 114/71 | HR 51 | Temp 98.2°F | Resp 17 | Ht 65.0 in | Wt 206.1 lb

## 2018-10-04 DIAGNOSIS — Z9221 Personal history of antineoplastic chemotherapy: Secondary | ICD-10-CM | POA: Insufficient documentation

## 2018-10-04 DIAGNOSIS — Z79899 Other long term (current) drug therapy: Secondary | ICD-10-CM | POA: Insufficient documentation

## 2018-10-04 DIAGNOSIS — Z87891 Personal history of nicotine dependence: Secondary | ICD-10-CM

## 2018-10-04 DIAGNOSIS — Z853 Personal history of malignant neoplasm of breast: Secondary | ICD-10-CM | POA: Insufficient documentation

## 2018-10-04 DIAGNOSIS — C50812 Malignant neoplasm of overlapping sites of left female breast: Secondary | ICD-10-CM

## 2018-10-04 DIAGNOSIS — Z923 Personal history of irradiation: Secondary | ICD-10-CM

## 2018-10-04 DIAGNOSIS — Z171 Estrogen receptor negative status [ER-]: Secondary | ICD-10-CM | POA: Diagnosis not present

## 2018-10-04 LAB — COMPREHENSIVE METABOLIC PANEL
ALK PHOS: 100 U/L (ref 38–126)
ALT: 19 U/L (ref 0–44)
ANION GAP: 8 (ref 5–15)
AST: 18 U/L (ref 15–41)
Albumin: 3.7 g/dL (ref 3.5–5.0)
BILIRUBIN TOTAL: 0.2 mg/dL — AB (ref 0.3–1.2)
BUN: 13 mg/dL (ref 6–20)
CALCIUM: 9.2 mg/dL (ref 8.9–10.3)
CO2: 26 mmol/L (ref 22–32)
Chloride: 107 mmol/L (ref 98–111)
Creatinine, Ser: 0.77 mg/dL (ref 0.44–1.00)
GFR calc Af Amer: >60 mL/min (ref >60)
Glucose, Bld: 99 mg/dL (ref 70–99)
Potassium: 4.7 mmol/L (ref 3.5–5.1)
Sodium: 141 mmol/L (ref 135–145)
TOTAL PROTEIN: 6.7 g/dL (ref 6.5–8.1)

## 2018-10-04 LAB — CBC WITH DIFFERENTIAL/PLATELET
Abs Immature Granulocytes: 0.01 10*3/uL (ref 0.00–0.07)
BASOS ABS: 0 10*3/uL (ref 0.0–0.1)
BASOS PCT: 1 %
EOS ABS: 0.1 10*3/uL (ref 0.0–0.5)
Eosinophils Relative: 1 %
HCT: 39.1 % (ref 36.0–46.0)
Hemoglobin: 12.3 g/dL (ref 12.0–15.0)
IMMATURE GRANULOCYTES: 0 %
Lymphocytes Relative: 39 %
Lymphs Abs: 2.2 10*3/uL (ref 0.7–4.0)
MCH: 28.5 pg (ref 26.0–34.0)
MCHC: 31.5 g/dL (ref 30.0–36.0)
MCV: 90.7 fL (ref 80.0–100.0)
MONOS PCT: 10 %
Monocytes Absolute: 0.6 10*3/uL (ref 0.1–1.0)
NEUTROS PCT: 49 %
NRBC: 0 % (ref 0.0–0.2)
Neutro Abs: 2.8 10*3/uL (ref 1.7–7.7)
PLATELETS: 286 10*3/uL (ref 150–400)
RBC: 4.31 MIL/uL (ref 3.87–5.11)
RDW: 13.7 % (ref 11.5–15.5)
WBC: 5.7 10*3/uL (ref 4.0–10.5)

## 2018-10-04 NOTE — Telephone Encounter (Signed)
Gave avs and calendar ° °

## 2018-10-10 IMAGING — MG MM PLC BREAST LOC DEV EA ADD LESION  INC MAMMO GUIDE*L*
8 of 10 series · 8 of 10 positions shown · non-contrast
Comparison: Previous exam(s).

CLINICAL DATA: 51-year-old female presenting for radioactive seed
localization of the left breast prior to lumpectomy.

EXAM:
MAMMOGRAPHIC GUIDED RADIOACTIVE SEED LOCALIZATION OF THE LEFT BREAST

[L ML (1 of 4)]
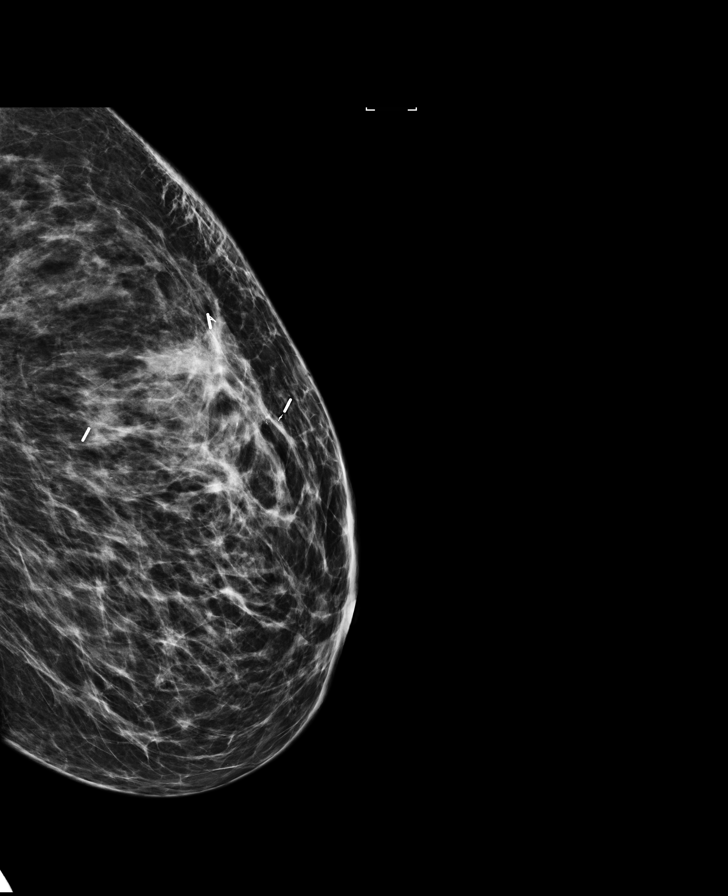

[L CC (1 of 4)]
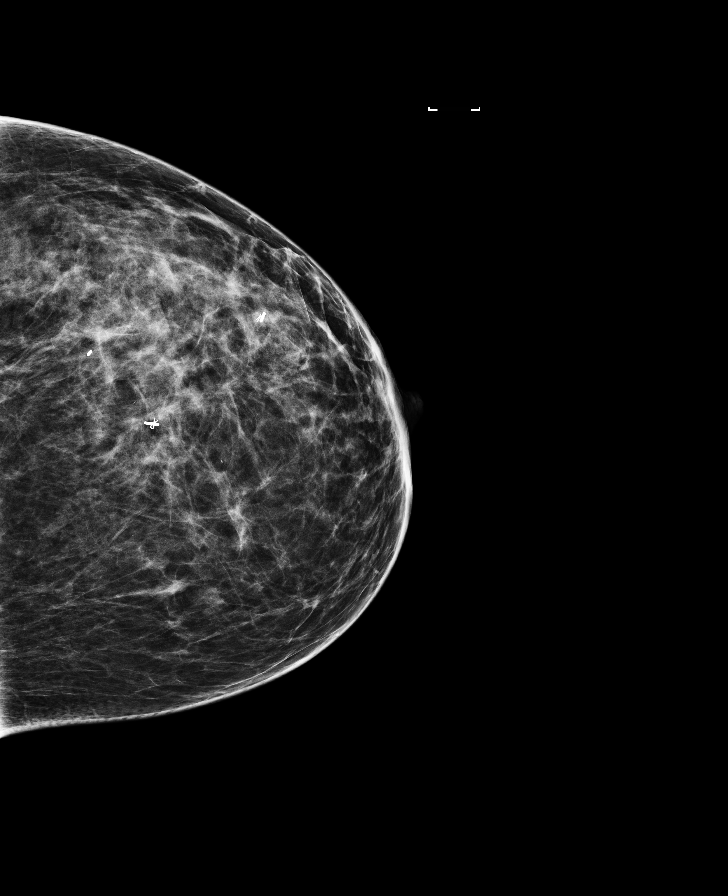

[L CC (2 of 4)]
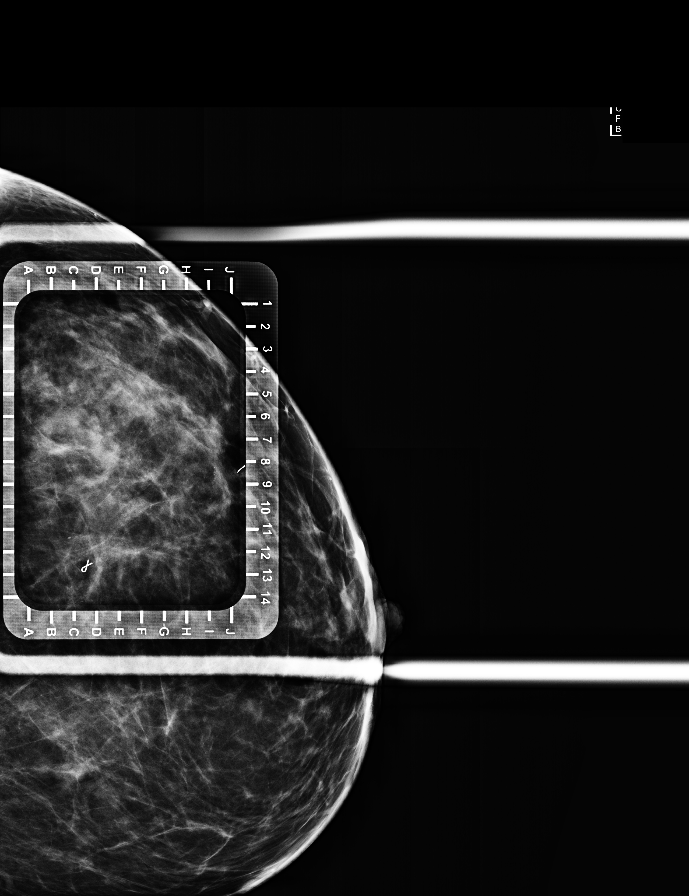

[L ML (2 of 4)]
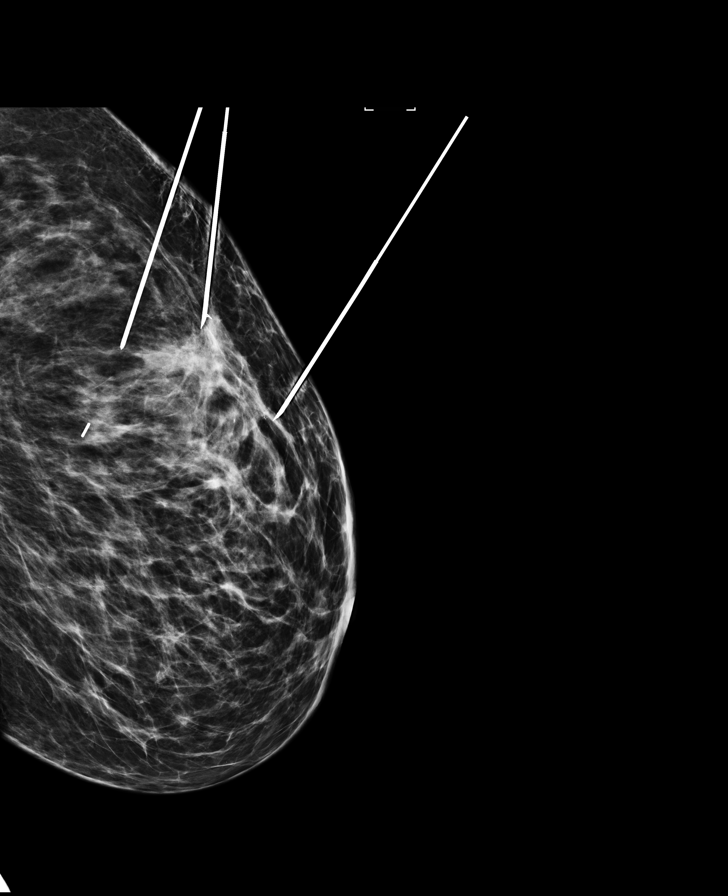

[L ML (3 of 4)]
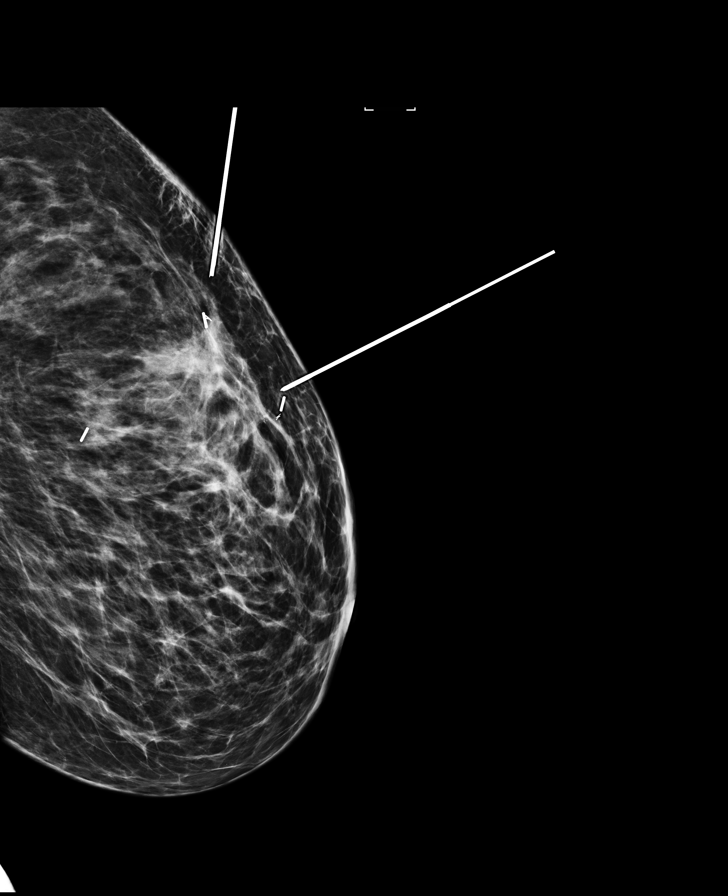

[L CC (3 of 4)]
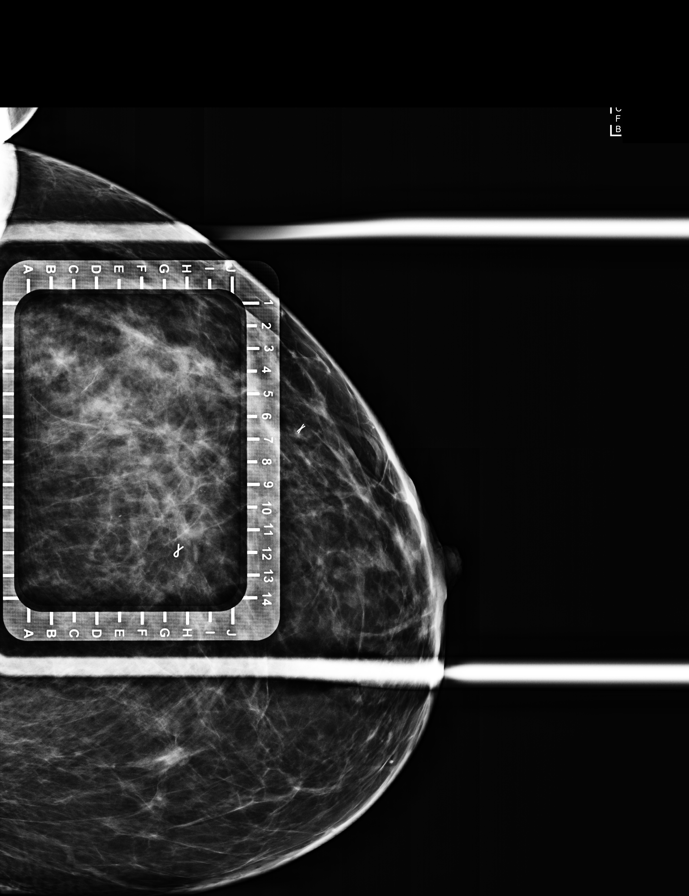

[L ML (4 of 4)]
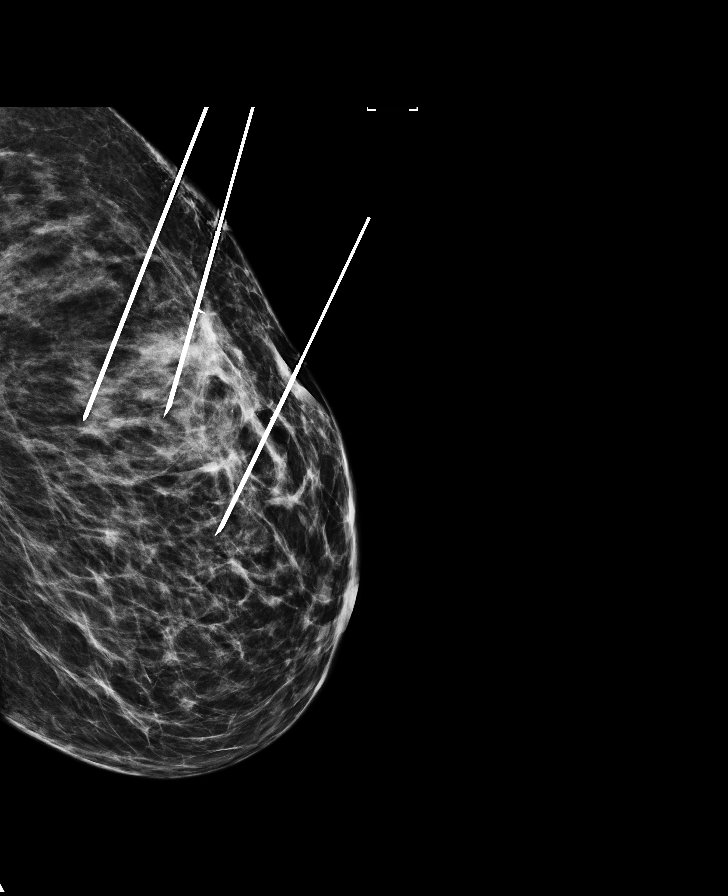

[L CC (4 of 4)]
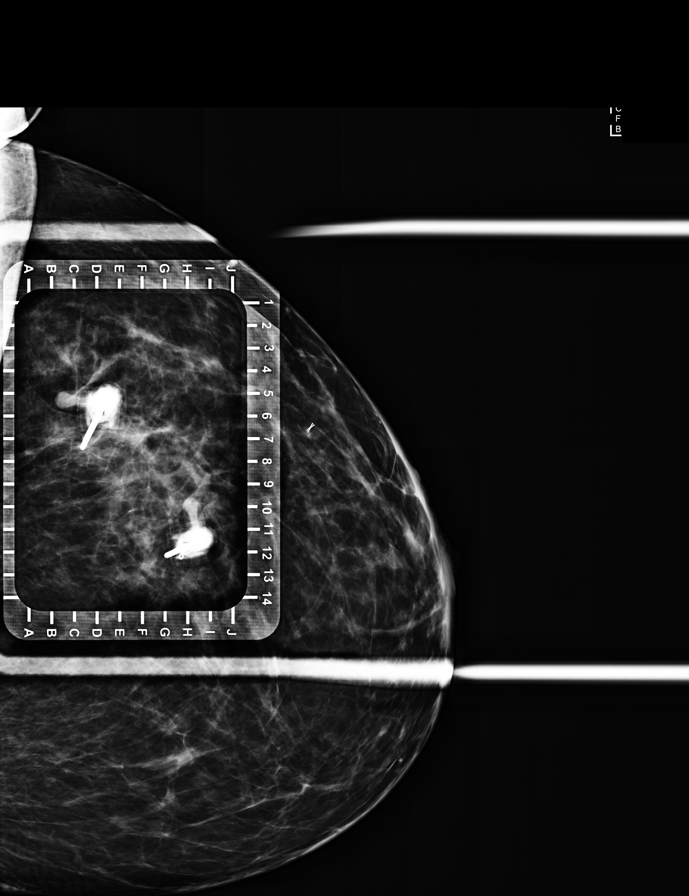

[8 of 10 positions shown; findings below may reference images not displayed]

FINDINGS: Patient presents for radioactive seed localization prior to left
breast lumpectomy. I met with the patient and we discussed the
procedure of seed localization including benefits and alternatives.
We discussed the high likelihood of a successful procedure. We
discussed the risks of the procedure including infection, bleeding,
tissue injury and further surgery. We discussed the low dose of
radioactivity involved in the procedure. Informed, written consent
was given.

The usual time-out protocol was performed immediately prior to the
procedure.

Magnification images were obtained prior to the seed placement to
determine the span of the calcifications remaining in the left
breast. On these magnification images, there is at least 5 cm of
calcifications primarily extending posterior to the 2 biopsy marking
clips and approximately 5.4 cm of calcifications extending deep to
the biopsy marking clips (perpendicular to the skin).

Prior to starting the localization, I spoke with Dr. Moreta by
phone to discuss plan for the seed placement. We agreed upon a plan
to place radioactive seeds at each of the biopsy marking clips, and
a third seed farther posterior and deeper within the breast to try
to demarcate the region of calcifications.

Using mammographic guidance, sterile technique, 1% lidocaine and an
8-5S6 radioactive seed, the ribbon shaped biopsy marking clip in the
upper-outer quadrant of the left breast was localized using a
superior approach.

Follow-up survey of the patient confirms presence of the radioactive
seed.

Order number of 8-5S6 seed:  [id number].

Total activity:  0.257 millicuries  Reference Date: 07/30/2017

------------------------------------------------

Using mammographic guidance, sterile technique, 1% lidocaine and an
8-5S6 radioactive seed, the X shaped biopsy marking clip in the
upper-outer quadrant of the left breast was localized using a
superior approach.

Follow-up survey of the patient confirms presence of the radioactive
seed.

Order number of 8-5S6 seed:  [id number].

Total activity:  0.257 millicuries  Reference Date: 07/30/2017

------------------------------------------------

Using mammographic guidance, sterile technique, 1% lidocaine and an
8-5S6 radioactive seed, the posterior an inferior aspect of the
residual calcifications was localized using a superior approach.
Follow-up survey of the patient confirms presence of the radioactive
seed.

Order number of 8-5S6 seed:  [id number].

Total activity:  0.257 millicuries  Reference Date: 07/30/2017

The follow-up mammogram images confirm the seeds to be in the
expected locations and were marked for Dr. Moreta. The patient
tolerated the procedure well and was released from the [REDACTED]. She was given instructions regarding seed removal.
IMPRESSION: Radioactive seed localization of 3 sites in the left breast. No
apparent complications.

## 2018-10-11 IMAGING — US US PLC BREAST LOC DEV 1ST LESION  INC US GUIDE*L*
1 series · 2 of 2 positions shown · non-contrast
Comparison: Previous exam(s).

CLINICAL DATA: 51-year-old female with history of left breast
cancer and biopsy proven left axillary lymph node metastases
presents for seed localization of the previously biopsied left
axillary lymph node.

EXAM:
ULTRASOUND GUIDED RADIOACTIVE SEED LOCALIZATION OF THE LEFT AXILLA

[Series 1: us plc breast loc dev 1st lesion inc us guide*left · 0.06mm/px · 2 of 2 slices shown]
[im 1/2]
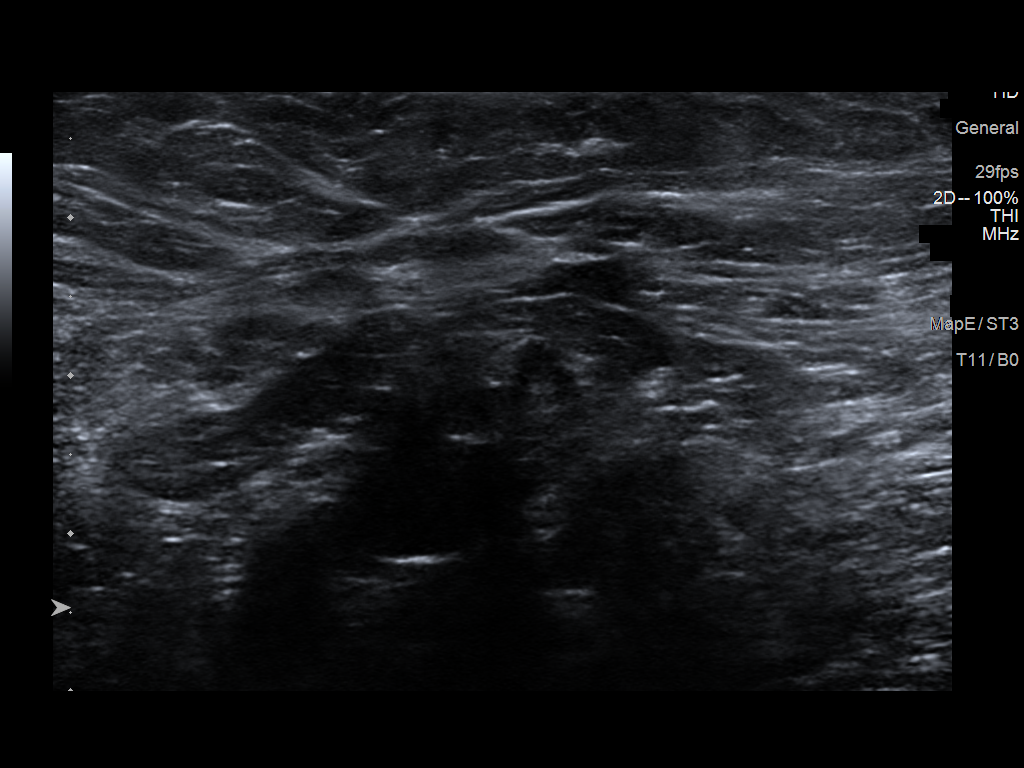
[im 2/2]
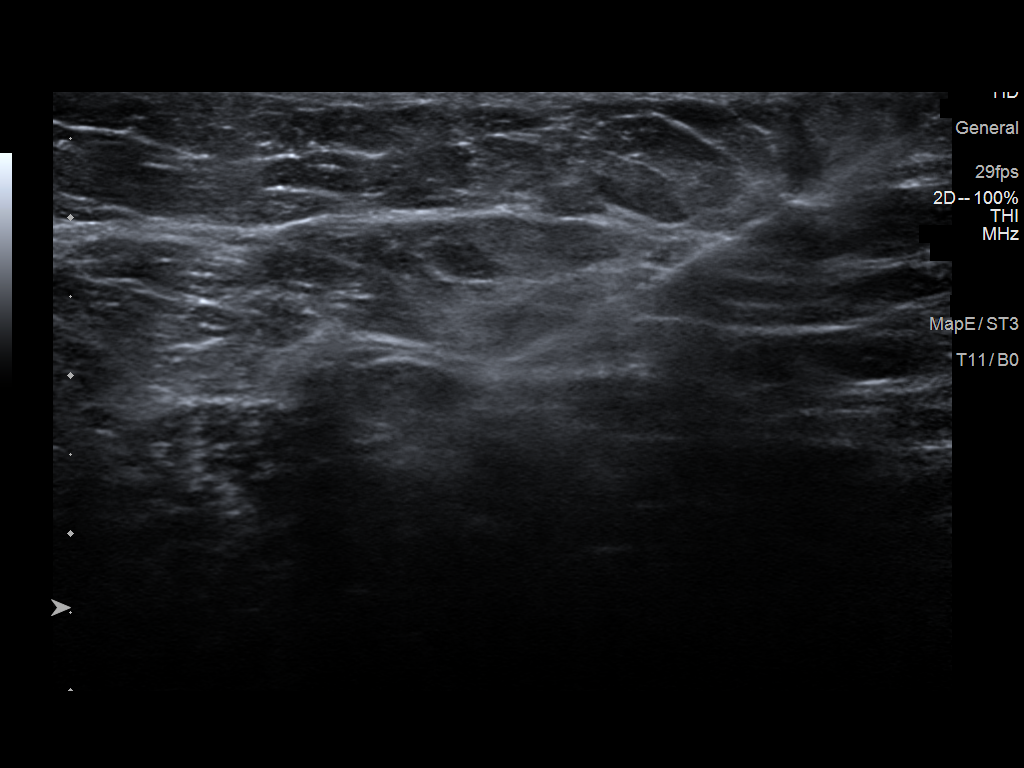

[2 of 2 positions shown; findings below may reference images not displayed]

FINDINGS: Patient presents for radioactive seed localization prior to left
breast lumpectomy and left axillary lymph node dissection. I met
with the patient and we discussed the procedure of seed localization
including benefits and alternatives. We discussed the high
likelihood of a successful procedure. We discussed the risks of the
procedure including infection, bleeding, tissue injury and further
surgery. We discussed the low dose of radioactivity involved in the
procedure. Informed, written consent was given.

The usual time-out protocol was performed immediately prior to the
procedure.

Using ultrasound guidance, sterile technique, 1% lidocaine and an
[id number] radioactive seed, the lymph node and associated biopsy marking
clip was was localized using a inferior to superior approach.

A follow-up mammogram was not performed as the spiral shaped biopsy
marking clip in the left axillary lymph node was not visualized on
the post biopsy mammograms due to the deep location.

Follow-up survey of the patient confirms presence of the radioactive
seed.

Order number of [id number] seed:  [id number].

Total activity: 0.252 millicuries Reference Date: 07/30/2017

The patient tolerated the procedure well and was released from the
[REDACTED]. She was given instructions regarding seed removal.
IMPRESSION: Radioactive seed localization left axilla. The radioactive seed was
placed adjacent to the previously biopsied lymph node in the left
axilla.

## 2018-10-11 IMAGING — RF DG CHOLANGIOGRAM OPERATIVE
1 series · 5 of 5 positions shown · non-contrast
Comparison: CT scan of the abdomen and pelvis 06/26/2017; right
upper quadrant abdominal ultrasound 06/12/2017

CLINICAL DATA: 51-year-old female undergoing elective
cholecystectomy

EXAM:
INTRAOPERATIVE CHOLANGIOGRAM
TECHNIQUE: Cholangiographic images from the C-arm fluoroscopic device were
submitted for interpretation post-operatively. Please see the
procedural report for the amount of contrast and the fluoroscopy
time utilized.

[Series 1: run · 2 acquisitions, 5 frames shown]
[im 1/2]
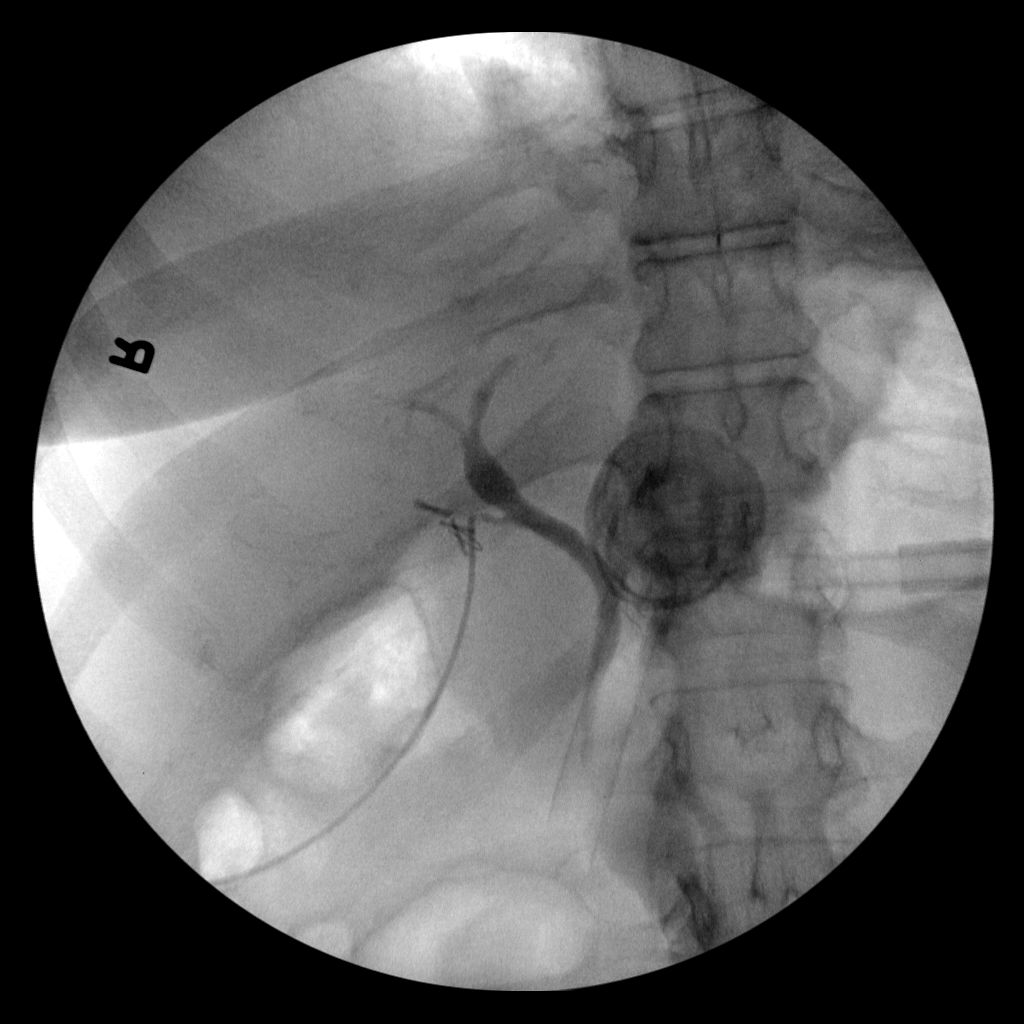
[im 1/2]
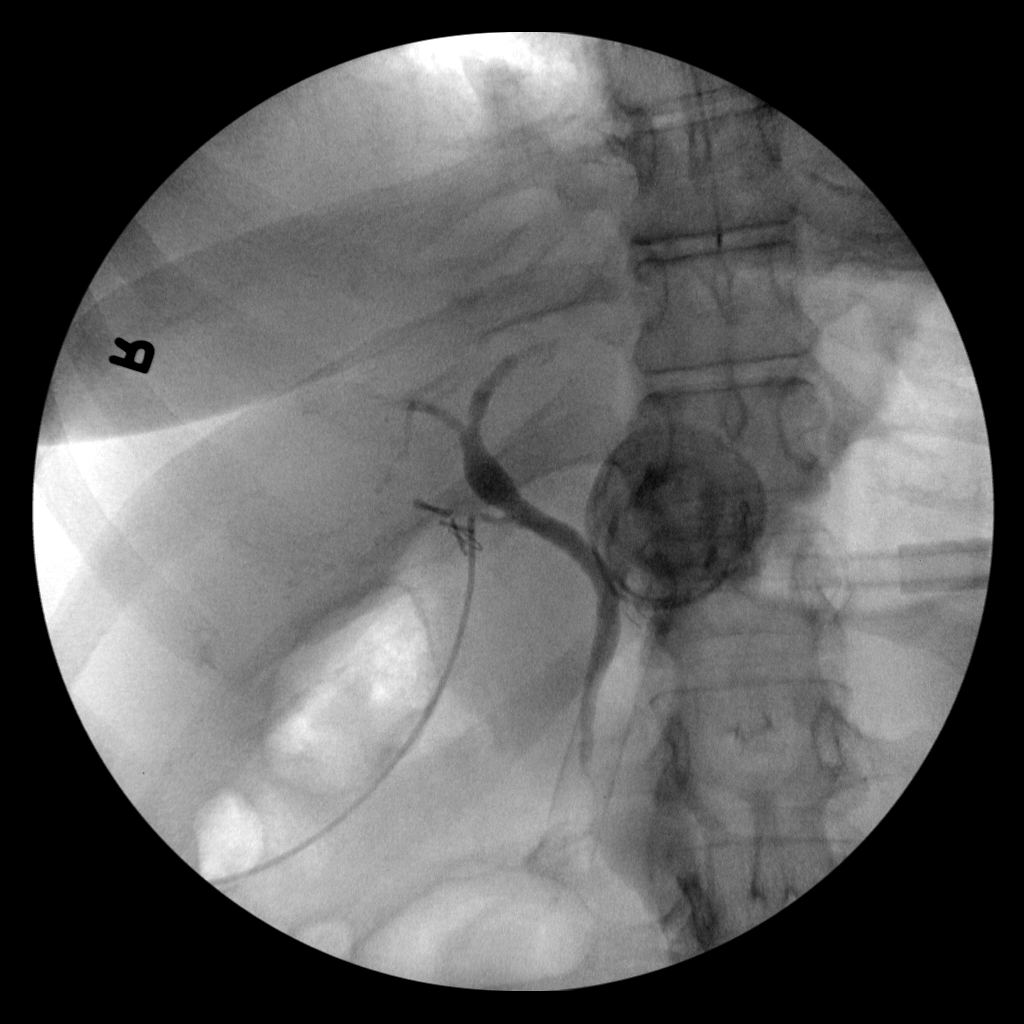
[im 1/2]
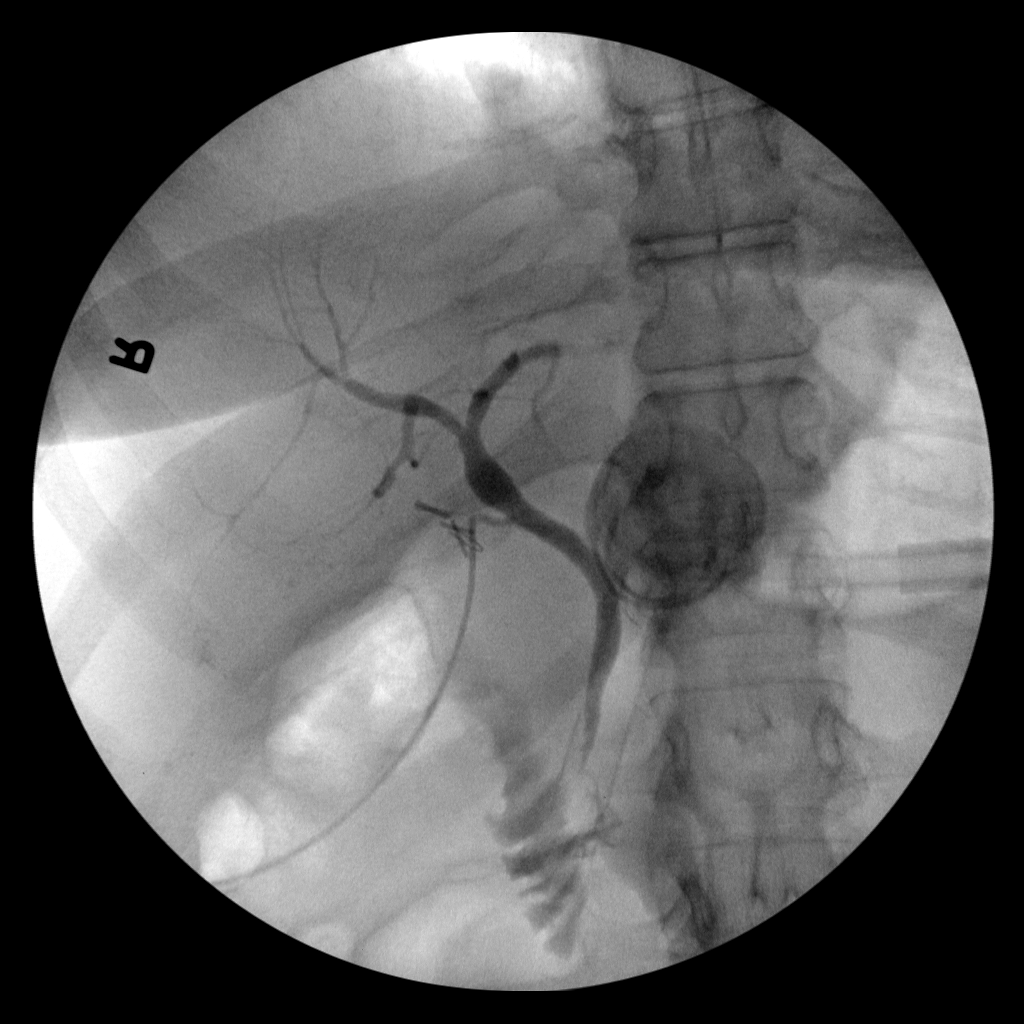
[im 1/2]
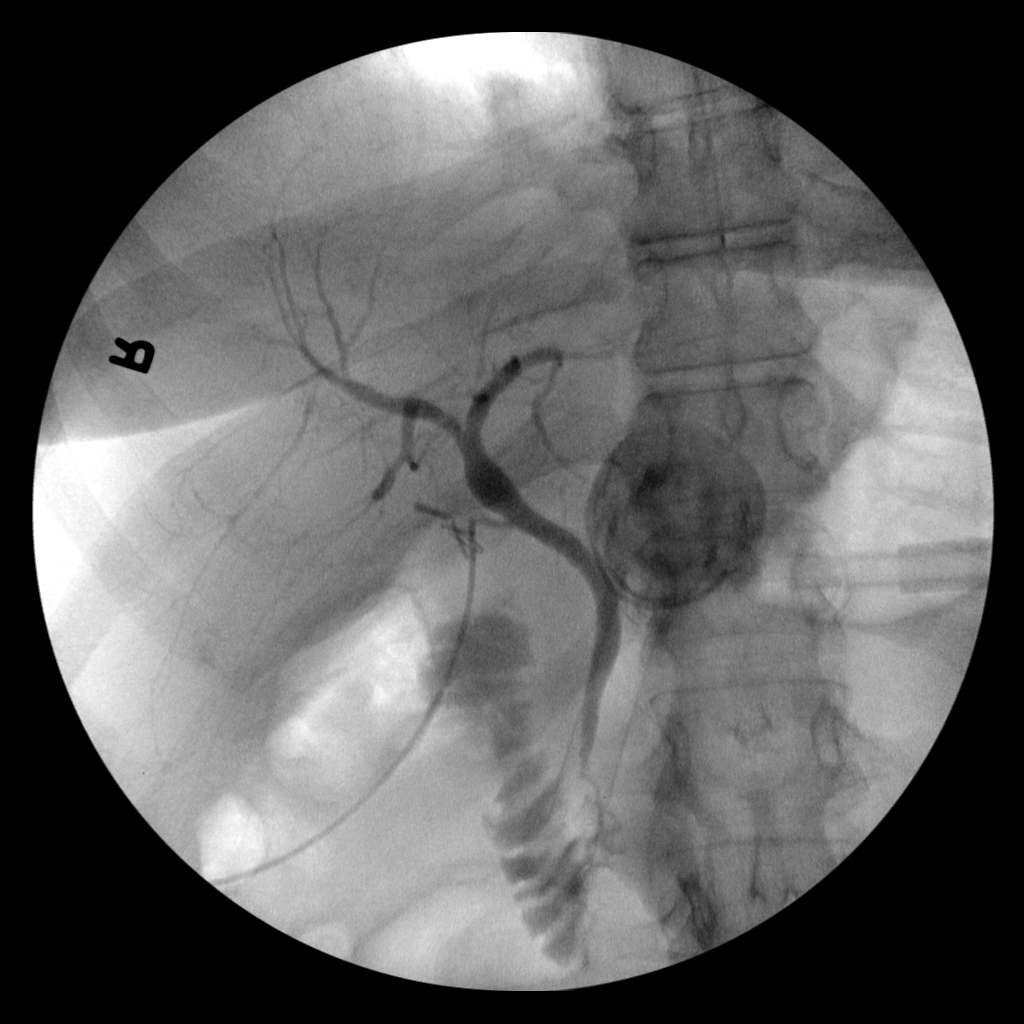
[im 2/2]
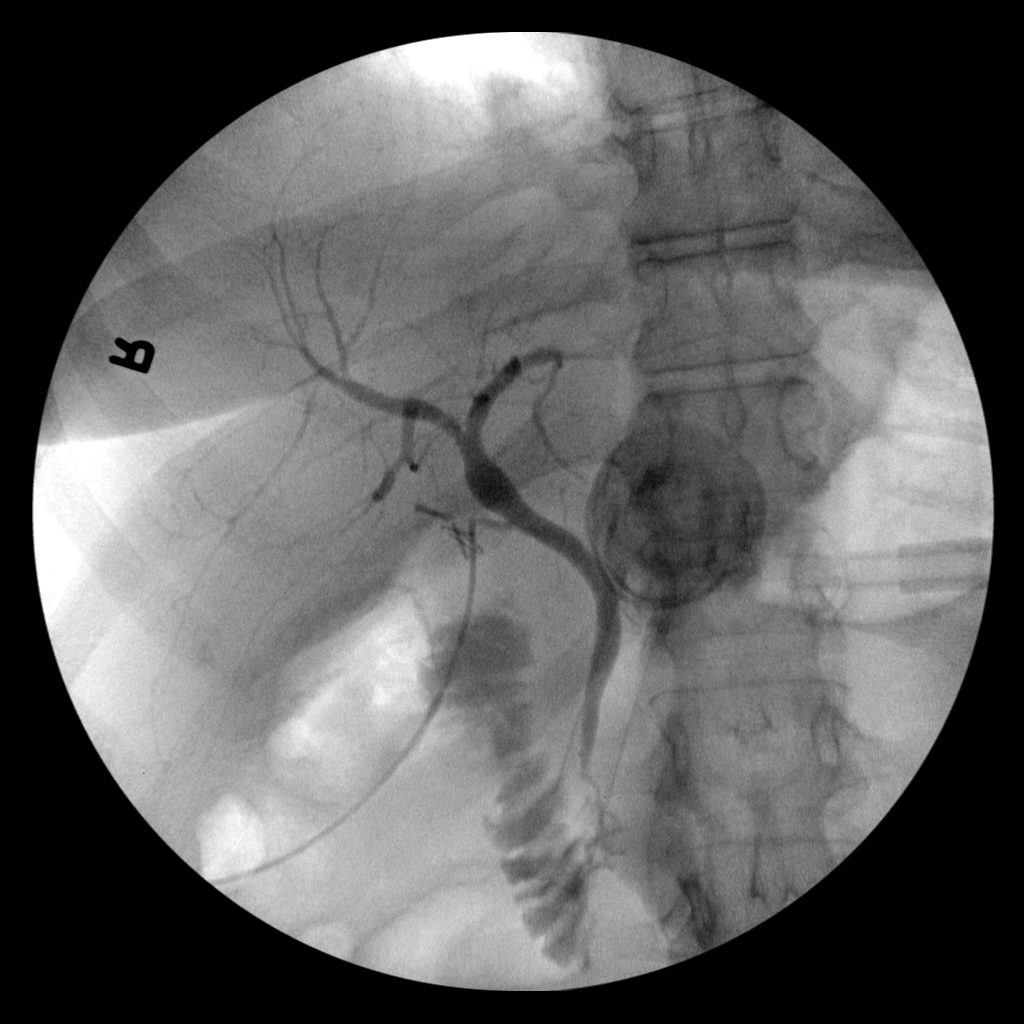

[5 of 5 positions shown; findings below may reference images not displayed]

FINDINGS: A cine clip and intraoperative saved image are submitted for review.
The images were obtained during intraoperative cholangiogram at the
time of laparoscopic cholecystectomy. The images demonstrate
cannulation of the cystic duct remanent and opacification of the
biliary tree. No evidence of biliary ductal dilatation, stenosis,
stricture or choledocholithiasis. Biliary anatomy is normal.
Contrast material passes freely through the ampulla and into the
duodenum.
IMPRESSION: Negative intraoperative cholangiogram.

## 2018-10-11 IMAGING — MG MM BREAST SURGICAL SPECIMEN
1 series · 1 of 1 positions shown · non-contrast
Comparison: Previous exam(s).

CLINICAL DATA: 51-year-old female with history of left breast
cancer post neoadjuvant chemotherapy. Radioactive seeds were placed
at 3 sites in the left breast as well as left axilla. This specimen
is for 2 of the radioactive seeds placed in the left breast.

EXAM:
SPECIMEN RADIOGRAPH OF THE LEFT BREAST

[L]
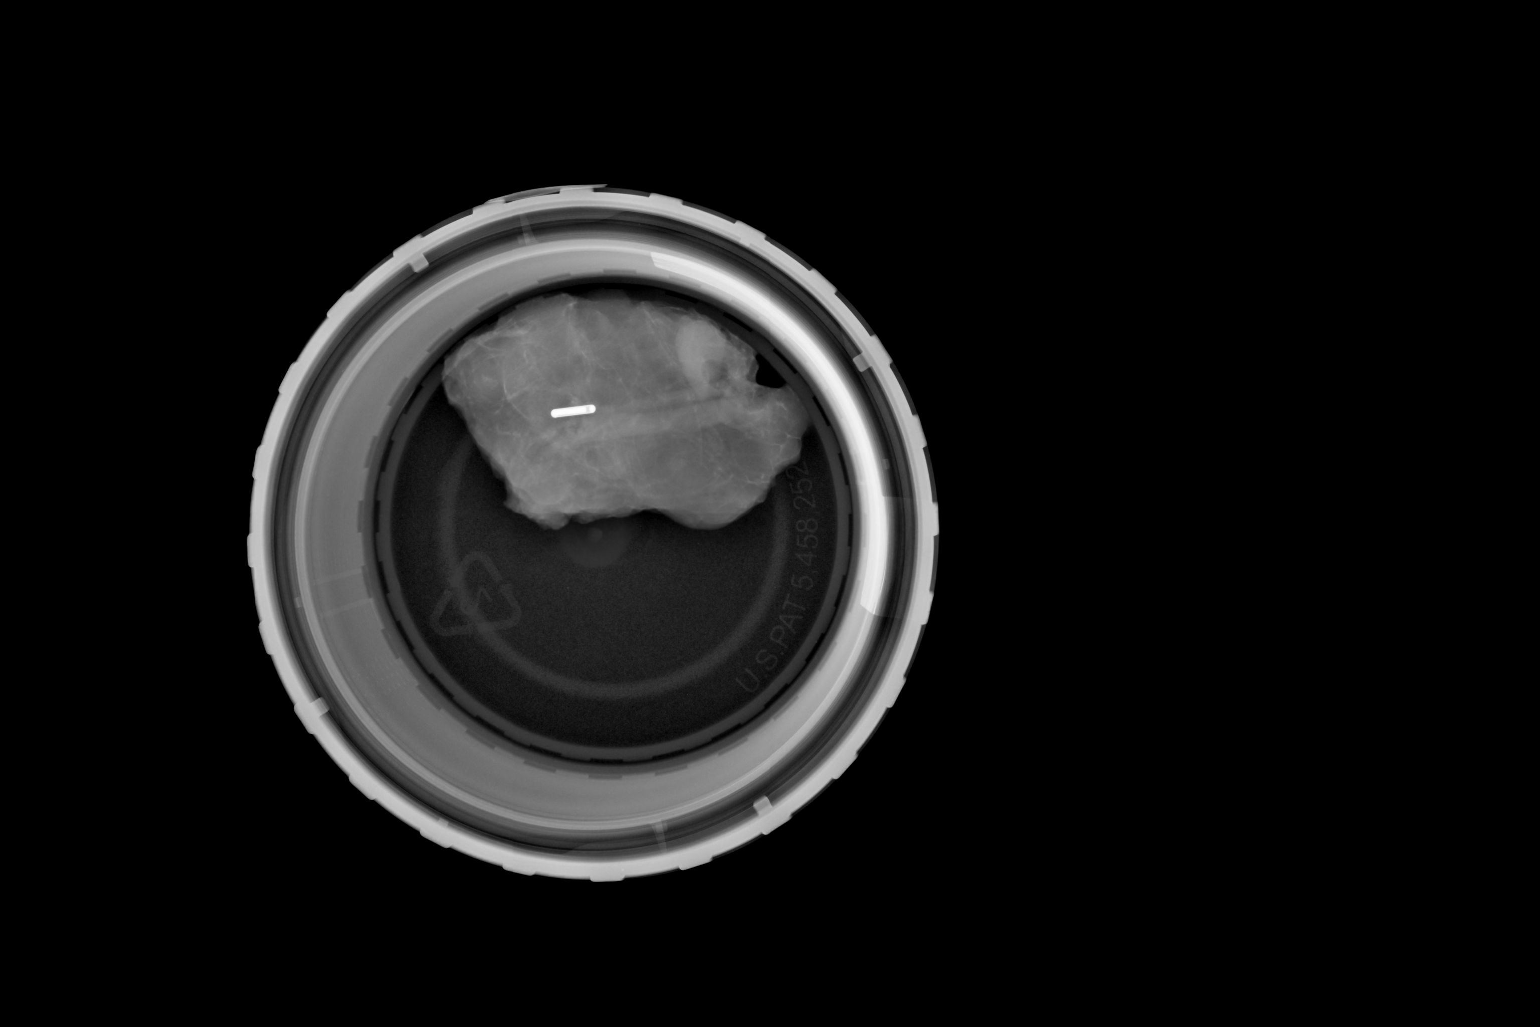

[1 of 1 positions shown; findings below may reference images not displayed]

FINDINGS: Status post excision of the left breast. Two radioactive seeds as
well as two biopsy marking clips are present in the specimen
radiograph and marked for pathology.
IMPRESSION: Specimen radiograph of the left breast.

## 2018-11-08 ENCOUNTER — Other Ambulatory Visit: Payer: Self-pay | Admitting: Physician Assistant

## 2018-12-01 ENCOUNTER — Other Ambulatory Visit: Payer: Self-pay | Admitting: Physician Assistant

## 2018-12-29 ENCOUNTER — Other Ambulatory Visit: Payer: Self-pay | Admitting: Physician Assistant

## 2019-01-23 ENCOUNTER — Other Ambulatory Visit: Payer: Self-pay | Admitting: Physician Assistant

## 2019-03-27 ENCOUNTER — Other Ambulatory Visit: Payer: Self-pay | Admitting: Oncology

## 2019-04-13 ENCOUNTER — Telehealth: Payer: Self-pay | Admitting: Oncology

## 2019-04-13 NOTE — Telephone Encounter (Signed)
I talk with patient regarding date change

## 2019-04-28 ENCOUNTER — Telehealth: Payer: Self-pay | Admitting: Adult Health

## 2019-04-28 ENCOUNTER — Other Ambulatory Visit: Payer: Self-pay | Admitting: Adult Health

## 2019-04-28 ENCOUNTER — Other Ambulatory Visit: Payer: Self-pay

## 2019-04-28 ENCOUNTER — Inpatient Hospital Stay (HOSPITAL_BASED_OUTPATIENT_CLINIC_OR_DEPARTMENT_OTHER): Payer: 59 | Admitting: Adult Health

## 2019-04-28 ENCOUNTER — Inpatient Hospital Stay: Payer: 59 | Attending: Adult Health

## 2019-04-28 ENCOUNTER — Encounter: Payer: Self-pay | Admitting: Adult Health

## 2019-04-28 VITALS — BP 112/60 | HR 68 | Temp 98.2°F | Resp 18 | Ht 65.0 in | Wt 214.6 lb

## 2019-04-28 DIAGNOSIS — Z923 Personal history of irradiation: Secondary | ICD-10-CM | POA: Diagnosis not present

## 2019-04-28 DIAGNOSIS — Z803 Family history of malignant neoplasm of breast: Secondary | ICD-10-CM | POA: Insufficient documentation

## 2019-04-28 DIAGNOSIS — Z9221 Personal history of antineoplastic chemotherapy: Secondary | ICD-10-CM

## 2019-04-28 DIAGNOSIS — C50812 Malignant neoplasm of overlapping sites of left female breast: Secondary | ICD-10-CM

## 2019-04-28 DIAGNOSIS — Z8249 Family history of ischemic heart disease and other diseases of the circulatory system: Secondary | ICD-10-CM | POA: Diagnosis not present

## 2019-04-28 DIAGNOSIS — Z87891 Personal history of nicotine dependence: Secondary | ICD-10-CM

## 2019-04-28 DIAGNOSIS — Z171 Estrogen receptor negative status [ER-]: Secondary | ICD-10-CM

## 2019-04-28 DIAGNOSIS — Z79899 Other long term (current) drug therapy: Secondary | ICD-10-CM | POA: Diagnosis not present

## 2019-04-28 DIAGNOSIS — C50412 Malignant neoplasm of upper-outer quadrant of left female breast: Secondary | ICD-10-CM

## 2019-04-28 LAB — CBC WITH DIFFERENTIAL/PLATELET
Abs Immature Granulocytes: 0.01 10*3/uL (ref 0.00–0.07)
Basophils Absolute: 0 10*3/uL (ref 0.0–0.1)
Basophils Relative: 1 %
Eosinophils Absolute: 0.1 10*3/uL (ref 0.0–0.5)
Eosinophils Relative: 1 %
HCT: 39.3 % (ref 36.0–46.0)
Hemoglobin: 12.7 g/dL (ref 12.0–15.0)
Immature Granulocytes: 0 %
Lymphocytes Relative: 38 %
Lymphs Abs: 1.9 10*3/uL (ref 0.7–4.0)
MCH: 28.5 pg (ref 26.0–34.0)
MCHC: 32.3 g/dL (ref 30.0–36.0)
MCV: 88.1 fL (ref 80.0–100.0)
Monocytes Absolute: 0.5 10*3/uL (ref 0.1–1.0)
Monocytes Relative: 10 %
Neutro Abs: 2.6 10*3/uL (ref 1.7–7.7)
Neutrophils Relative %: 50 %
Platelets: 289 10*3/uL (ref 150–400)
RBC: 4.46 MIL/uL (ref 3.87–5.11)
RDW: 13.1 % (ref 11.5–15.5)
WBC: 5.1 10*3/uL (ref 4.0–10.5)
nRBC: 0 % (ref 0.0–0.2)

## 2019-04-28 LAB — COMPREHENSIVE METABOLIC PANEL
ALT: 20 U/L (ref 0–44)
AST: 19 U/L (ref 15–41)
Albumin: 4 g/dL (ref 3.5–5.0)
Alkaline Phosphatase: 92 U/L (ref 38–126)
Anion gap: 10 (ref 5–15)
BUN: 16 mg/dL (ref 6–20)
CO2: 26 mmol/L (ref 22–32)
Calcium: 9.2 mg/dL (ref 8.9–10.3)
Chloride: 105 mmol/L (ref 98–111)
Creatinine, Ser: 0.74 mg/dL (ref 0.44–1.00)
GFR calc Af Amer: >60 mL/min (ref >60)
GFR calc non Af Amer: >60 mL/min (ref >60)
Glucose, Bld: 93 mg/dL (ref 70–99)
Potassium: 4.4 mmol/L (ref 3.5–5.1)
Sodium: 141 mmol/L (ref 135–145)
Total Bilirubin: 0.3 mg/dL (ref 0.3–1.2)
Total Protein: 7.1 g/dL (ref 6.5–8.1)

## 2019-04-28 NOTE — Progress Notes (Signed)
[place]  [phone] [phone]    ID: JAZARIAH TEALL DOB: 04/19/1966  MR#: [id number]  CNO#:[id number]  Patient Care Team: Yvone Neu, MD as PCP - General (Family Medicine) Excell Seltzer, MD as Consulting Physician (General Surgery) Magrinat, Virgie Dad, MD as Consulting Physician (Oncology) Eppie Gibson, MD as Attending Physician (Radiation Oncology) Tommie Sams, MD as Referring Physician (Internal Medicine) OTHER MD:   CHIEF COMPLAINT: HER-2 positive estrogen receptor negative breast cancer  CURRENT TREATMENT: observation   INTERVAL HISTORY: Roxene returns today for follow-up of her estrogen receptor negative, HER2 positive breast cancer. She is accompanied by her husband.  The patient continues under observation.   Since her last visit she has been doing well.  She has not had any other tests or imaging.    REVIEW OF SYSTEMS: Quilla notes that she has gained weight during the pandemic.  She and her daughter have started doing weight watchers together.  She hasn't yet started exercising but does have a plan in place.  Andrian sees her PCP regularly and is up to date with her cancer screenings.  She has not yet had her bilateral mammogram and ultrasound follow up scheduled.    Elyn denies any new concerns today.  She has no new pain, unusual headaches, vision changes, chest pain, palpitations, cough, shortness of breath, nausea, vomiting, bowel/bladder changes, or any other concerns.  A detailed ROS was otherwise non contributory.     BREAST CANCER HISTORY: From the original intake note:  Griselle herself noted a change in her left breast when looking into a mirror and then palpating a mass in the upper-outer quadrant. She brought this to medical attention and on 02/24/2017 underwent bilateral diagnostic mammography with tomography and left breast ultrasonography at the breast Center. The breast density was category C.  In the upper portion of the left breast there was a broad area of distortion measuring up to 7.5 cm. On exam there was a broad firm area involving the upper outer quadrant of the left breast which was visibly protruding. By ultrasound at the 11:00 axis 8 cm from the nipple there was an irregular hypoechoic mass measuring 1.4 cm with additiona ases at 2:00, 3 cm from the nipple and multiple other masses as described, all in the upper-outer quadrant primarily. Ultrasound of the left axilla showed at least 3 prominent lymph nodes one of which had a cortical bulge.  On 03/02/2017 the patient underwent biopsy of a left breast mass at the 11:30 o'clock position and a second mass described as upper outer quadrant as well as one of the suspicious left axillary lymph nodes. These all showed invasive ductal carcinoma, grade 3. Prognostic panel from one of the 2 masses showed the tumor to be estrogen and progesterone receptor negative, but HER-2 amplified, with a signals ratio of 8.24 and the number per cell 15.65.  Her subsequent history is as detailed below   PAST MEDICAL HISTORY: Past Medical History:  Diagnosis Date  . Breast cancer ([facility]) 2018   Last chemo 0ct 2018, last radiation Jan. 2019  . 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 Cataracts, bilateral    bil cataracts removed  . Complication of anesthesia    blood pressure very low after surgery difficult to wake -1st surgery only-when pt was 53 y.o.  . Family history of breast cancer   . Frozen shoulder    left  . Gallbladder  disease    "sludge in gallbladder",   . Hiatal hernia   . History of hiatal hernia   . Hx of thyroid nodule    benign  . Hyperlipidemia   . Personal history of chemotherapy   . Personal history of radiation therapy   . Thyroid disease    Thyroid nodules - bx was negative  . Uterine fibroid     PAST SURGICAL HISTORY: Past Surgical History:  Procedure  Laterality Date  . ABDOMINAL HYSTERECTOMY    . ABDOMINOPLASTY N/A 02/28/2014   Procedure: ABDOMINOPLASTY with Panniculectomy;  Surgeon: [name], MD;  Location: [facility];  Service: Gynecology;  Laterality: N/A;  . APPENDECTOMY    . BILATERAL SALPINGECTOMY Bilateral 02/28/2014   Procedure: BILATERAL SALPINGECTOMY;  Surgeon: [name], MD;  Location: [facility];  Service: Gynecology;  Laterality: Bilateral;  . BREAST LUMPECTOMY    . BREAST LUMPECTOMY WITH RADIOACTIVE SEED AND SENTINEL LYMPH NODE BIOPSY Left 08/19/2017   Procedure: LEFT BREAST LUMPECTOMY WITH RADIOACTIVE SEED AND LEFT SENTINEL LYMPH NODE BIOPSY;  Surgeon: Excell Seltzer, MD;  Location: [facility];  Service: General;  Laterality: Left;  . CATARACT EXTRACTION, BILATERAL    . CHOLECYSTECTOMY N/A 08/19/2017   Procedure: LAPAROSCOPIC CHOLECYSTECTOMY WITH INTRAOPERATIVE CHOLANGIOGRAM;  Surgeon: Excell Seltzer, MD;  Location: [facility];  Service: General;  Laterality: N/A;  . PORTACATH PLACEMENT Right 03/18/2017   Procedure: INSERTION PORT-A-CATH;  Surgeon: Excell Seltzer, MD;  Location: [facility];  Service: General;  Laterality: Right;  . removal portacath  08/2017  . reverse tubal ligation    . SCAR REVISION N/A 02/28/2014   Procedure: SCAR REVISION;  Surgeon: [name], MD;  Location: [facility];  Service: Gynecology;  Laterality: N/A;  . SUPRACERVICAL ABDOMINAL HYSTERECTOMY N/A 02/28/2014   Procedure: HYSTERECTOMY SUPRACERVICAL ABDOMINAL;  Surgeon: [name], MD;  Location: [facility];  Service: Gynecology;  Laterality: N/A;  . TUBAL LIGATION    . WISDOM TOOTH EXTRACTION      FAMILY HISTORY Family History  Problem Relation Age of Onset  . Heart disease Mother   . Hypertension Mother   . Diabetes Father   . Heart disease Father   . Heart disease Maternal Grandmother   . Colon cancer Maternal Grandmother 89       died in her 39's  . Heart disease Maternal Grandfather   . Esophageal cancer Maternal Grandfather 50        died at 51  . Diabetes Paternal Grandmother   . Heart disease Paternal Grandmother   . Kidney cancer Paternal Grandmother 37       died in her 63's  . Tuberculosis Paternal Grandfather   . Heart disease Paternal Grandfather   . Skin cancer Paternal Grandfather 59       died at 22, patient not sure if it was melanoma, BCC, Squamous, etc.  . Breast cancer Maternal Aunt 50       died at 58  . Breast cancer Cousin 76       she is now in her 78's  . Rectal cancer Neg Hx   . Stomach cancer Neg Hx    The patient's father died from heart disease at age 77. The patient's mother is living at age 66. The patient has one brother, 2 sisters. On the mother's side and aunt had breast cancer at age 75 and her daughter had breast cancer in her early 24s. There is also a grandfather with esophageal cancer and a grandmother with  colon cancer. On the paternal side there is a grandmother with kidney cancer at an early age and a grandfather with melanoma at an early age.   GYNECOLOGIC HISTORY:  Patient's last menstrual period was 02/11/2014.  Menarche age 7, first live birth age 66, the patient is Basye P2. She underwent hysterectomy without salpingo-oophorectomy May 2016. She did not use hormone replacement. She did use oral contraceptives approximately 10 years in the 1980s.   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  She has a second job for [employer] so basically she is currently weight working from 8:30 in the morning to 10 PM in the evening.  Her husband Darnelle Maffucci is Surveyor, mining in the same [employer], which has a 10% aviation enrollment. Daughter Lorenda Hatchet is a hairstylist in [place] and son Chip Boer is a Physiological scientist in [place] the patient has 2 grandsons and one granddaughter. She attends a [employer]   ADVANCED DIRECTIVES: The patient has completed advanced directives and will have them notarized   HEALTH MAINTENANCE: Social  History   Tobacco Use  . Smoking status: Former Smoker    Packs/day: 0.50    Years: 34.00    Pack years: 17.00    Types: Cigarettes    Quit date: 10/13/2013    Years since quitting: 5.5  . Smokeless tobacco: Never Used  Substance Use Topics  . Alcohol use: No    Frequency: Never  . Drug use: No    Colonoscopy:No  PAP: 03/04/2017  Bone density: No   No Known Allergies  Current Outpatient Medications  Medication Sig Dispense Refill  . esomeprazole (NEXIUM) 40 MG capsule TAKE 1 CAPSULE BY MOUTH TWICE A DAY BEFORE A MEAL 180 capsule 1  . ALPRAZolam (XANAX) 0.5 MG tablet TAKE HALF TO 1 TABLET BY MOUTH DAILY AS NEEDED FOR ANXIETY  2  . latanoprost (XALATAN) 0.005 % ophthalmic solution Place 1 drop into both eyes at bedtime.    . rosuvastatin (CRESTOR) 5 MG tablet Take 5 mg by mouth daily.     No current facility-administered medications for this visit.    Facility-Administered Medications Ordered in Other Visits  Medication Dose Route Frequency Provider Last Rate Last Dose  . heparin lock flush 100 unit/mL  500 Units Intracatheter Once Magrinat, Virgie Dad, MD      . sodium chloride flush (NS) 0.9 % injection 10 mL  10 mL Intracatheter Once Magrinat, Virgie Dad, MD        OBJECTIVE: Middle-aged white woman in no acute distress  Vitals:   04/28/19 1029  BP: 112/60  Pulse: 68  Resp: (!) 98  Temp: 98.2 F (36.8 C)  SpO2: 99%     Body mass index is 35.71 kg/m.     ECOG FS:1 - Symptomatic but completely ambulatory GENERAL: Patient is a well appearing female in no acute distress HEENT:  Sclerae anicteric.  Oropharynx clear and moist. No ulcerations or evidence of oropharyngeal candidiasis. Neck is supple.  NODES:  No cervical, supraclavicular, or axillary lymphadenopathy palpated.  BREAST EXAM:  Right breast benign, left breast s/p lumpectomy and radiation, no sign of recurrence. LUNGS:  Clear to auscultation bilaterally.  No wheezes or rhonchi. HEART:  Regular rate and rhythm.  No murmur appreciated. ABDOMEN:  Soft, nontender.  Positive, normoactive bowel sounds. No organomegaly palpated. MSK:  No focal spinal tenderness to palpation. Full range of motion bilaterally in the upper extremities. EXTREMITIES:  No peripheral edema.  SKIN:  Clear with no obvious rashes or skin changes. No nail dyscrasia. NEURO:  Nonfocal. Well oriented.  Appropriate affect.    LAB RESULTS:  CMP     Component Value Date/Time   NA 141 10/04/2018 0907   NA 139 10/14/2017 1159   K 4.7 10/04/2018 0907   K 4.3 10/14/2017 1159   CL 107 10/04/2018 0907   CO2 26 10/04/2018 0907   CO2 25 10/14/2017 1159   GLUCOSE 99 10/04/2018 0907   GLUCOSE 92 10/14/2017 1159   BUN 13 10/04/2018 0907   BUN 15.9 10/14/2017 1159   CREATININE 0.77 10/04/2018 0907   CREATININE 0.7 10/14/2017 1159   CALCIUM 9.2 10/04/2018 0907   CALCIUM 9.3 10/14/2017 1159   PROT 6.7 10/04/2018 0907   PROT 6.8 10/14/2017 1159   ALBUMIN 3.7 10/04/2018 0907   ALBUMIN 3.6 10/14/2017 1159   AST 18 10/04/2018 0907   AST 20 10/14/2017 1159   ALT 19 10/04/2018 0907   ALT 23 10/14/2017 1159   ALKPHOS 100 10/04/2018 0907   ALKPHOS 83 10/14/2017 1159   BILITOT 0.2 (L) 10/04/2018 0907   BILITOT 0.23 10/14/2017 1159   GFRNONAA >60 10/04/2018 0907   GFRAA >60 10/04/2018 0907    No results found for: TOTALPROTELP, ALBUMINELP, A1GS, A2GS, BETS, BETA2SER, GAMS, MSPIKE, SPEI  No results found for: Nils Pyle, [facility]  Lab Results  Component Value Date   WBC 5.1 04/28/2019   NEUTROABS 2.6 04/28/2019   HGB 12.7 04/28/2019   HCT 39.3 04/28/2019   MCV 88.1 04/28/2019   PLT 289 04/28/2019      Chemistry      Component Value Date/Time   NA 141 10/04/2018 0907   NA 139 10/14/2017 1159   K 4.7 10/04/2018 0907   K 4.3 10/14/2017 1159   CL 107 10/04/2018 0907   CO2 26 10/04/2018 0907   CO2 25 10/14/2017 1159   BUN 13 10/04/2018 0907   BUN 15.9 10/14/2017 1159   CREATININE 0.77 10/04/2018 0907    CREATININE 0.7 10/14/2017 1159      Component Value Date/Time   CALCIUM 9.2 10/04/2018 0907   CALCIUM 9.3 10/14/2017 1159   ALKPHOS 100 10/04/2018 0907   ALKPHOS 83 10/14/2017 1159   AST 18 10/04/2018 0907   AST 20 10/14/2017 1159   ALT 19 10/04/2018 0907   ALT 23 10/14/2017 1159   BILITOT 0.2 (L) 10/04/2018 0907   BILITOT 0.23 10/14/2017 1159       No results found for: LABCA2  No components found for: GQQPYP950  No results for input(s): INR in the last 168 hours.  Urinalysis    Component Value Date/Time   COLORURINE YELLOW 02/23/2014 1004   APPEARANCEUR CLEAR 02/23/2014 1004   LABSPEC 1.025 02/23/2014 1004   PHURINE 5.5 02/23/2014 1004   GLUCOSEU NEGATIVE 02/23/2014 1004   HGBUR NEGATIVE 02/23/2014 1004   BILIRUBINUR NEGATIVE 02/23/2014 1004   KETONESUR NEGATIVE 02/23/2014 1004   PROTEINUR NEGATIVE 02/23/2014 1004   UROBILINOGEN 0.2 02/23/2014 1004   NITRITE NEGATIVE 02/23/2014 1004   LEUKOCYTESUR NEGATIVE 02/23/2014 1004     STUDIES: No results found.  ELIGIBLE FOR AVAILABLE RESEARCH PROTOCOL: no  ASSESSMENT: 53 y.o. [place], [place] woman status post left breast overlapping sites biopsy 03/02/2017 for a clinical T3 N1-2, stage IIIA invasive ductal carcinoma, grade 3, estrogen and progesterone receptor negative, HER-2 amplified, with an MIB-1 of 35%  (1) genetics testing07/24/2018 through the Hereditary Gene Panel offered by [facility] found no deleterious mutations in  APC, ATM, AXIN2, BARD1, BMPR1A, BRCA1, BRCA2, BRIP1, CDH1, CDKN2A (p14ARF), CDKN2A (p16INK4a), CHEK2, CTNNA1, DICER1, EPCAM (Deletion/duplication testing only), GREM1 (promoter region deletion/duplication testing only), KIT, MEN1, MLH1, MSH2, MSH3, MSH6, MUTYH, NBN, NF1, NHTL1, PALB2, PDGFRA, PMS2, POLD1, POLE, PTEN, RAD50, RAD51C, RAD51D, SDHB, SDHC, SDHD, SMAD4, SMARCA4. STK11, TP53, TSC1, TSC2, and VHL.  The following genes were evaluated for sequence changes only: SDHA and HOXB13 c.251G>A variant only.   (2) neoadjuvant chemotherapy consisting of carboplatin and Docetaxel given with trastuzumab and Pertuzumab every 21 days for 6 cycles, started 03/31/2017, completed 07/14/2017  (a) Pertuzumab stopped after cycle 1 secondary to side effects     (b) Pertuzumab resumed cycle 3, not at loading dose.  (3) trastuzumab continued to complete 6 months (final dose 09/22/2017)  (a) echocardiogram 03/20/2017 showed an ejection fraction of 55%  (b) echocardiogram 06/30/2017 shows an ejection fraction of 55%  (c) echocardiogram 09/30/2017 shows an ejection fraction of 55-60% range   (4) status post left lumpectomy and axillary lymph node dissection 08/19/2017 for a residual ypT1a ypN0 invasive ductal carcinoma, grade 2, with negative margins, repeat prognostic panel again estrogen and progesterone receptor negative, but HER-2 amplified  (5) adjuvant radiation 09/22/17 - 11/05/17 Site/dose:   Left breast treated to 50 Gy with 25 fx of 2 Gy                       Left supraclavicular region treated to 45 Gy with 25 fx of 1.8 Gy                      Left breast boost of 10 Gy with 5 fx of 2 Gy   PLAN: Maria Hester is doing well today.  She is without any clinical concern for recurrence of her breast cancer.  She is overdue for her bilateral mammogram and ultrasound as follow up from the breast center biopsy done in 08/2018.  I placed orders for this to happen and my nurse Cecille Rubin called and ensured that she got scheduled for this.    Girl was recommended to continue to f/u with her PCP regularly, to continue on weight watchers, and I encouraged her to start exercising.    Melane will return in 6 months for labs and f/u.  She was recommended to continue with the appropriate pandemic precautions. She knows to call for any problems that may develop before her next visit here.  A total of (20) minutes of face-to-face time was spent with this patient with greater than 50% of that time in counseling and  care-coordination.   Wilber Bihari, NP  04/28/19 10:32 AM Medical Oncology and Hematology [facility] [address] [place], [place] [address] Tel. [phone]    Fax. [phone]

## 2019-04-28 NOTE — Telephone Encounter (Signed)
Patient decline avs and calendar °

## 2019-05-03 ENCOUNTER — Other Ambulatory Visit: Payer: 59

## 2019-05-03 ENCOUNTER — Ambulatory Visit: Payer: 59 | Admitting: Oncology

## 2019-05-03 ENCOUNTER — Ambulatory Visit: Payer: 59 | Admitting: Adult Health

## 2019-05-10 ENCOUNTER — Ambulatory Visit: Payer: 59

## 2019-05-10 ENCOUNTER — Ambulatory Visit
Admission: RE | Admit: 2019-05-10 | Discharge: 2019-05-10 | Disposition: A | Discharge status: Home / Self Care | Payer: 59 | Source: Ambulatory Visit | Attending: Adult Health | Admitting: Adult Health

## 2019-05-10 ENCOUNTER — Other Ambulatory Visit: Payer: Self-pay

## 2019-05-10 DIAGNOSIS — C50412 Malignant neoplasm of upper-outer quadrant of left female breast: Secondary | ICD-10-CM

## 2019-05-10 DIAGNOSIS — Z171 Estrogen receptor negative status [ER-]: Secondary | ICD-10-CM

## 2019-05-10 DIAGNOSIS — C50812 Malignant neoplasm of overlapping sites of left female breast: Secondary | ICD-10-CM

## 2019-07-09 ENCOUNTER — Other Ambulatory Visit: Payer: Self-pay | Admitting: Internal Medicine

## 2019-07-28 ENCOUNTER — Other Ambulatory Visit: Payer: Self-pay | Admitting: Internal Medicine

## 2019-10-30 NOTE — Progress Notes (Signed)
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 The patient continues under observation.   Since her last visit, she underwent bilateral diagnostic mammography with tomography at [facility] on 05/10/2019 showing: breast density category B; no evidence of malignancy in either breast.     REVIEW OF SYSTEMS: Maria Hester continues to work sometimes up to 16 hours a day.  She does not have a lot of time to exercise.  She does have a stand-up desk at her main job but she does not use it much.  She is having some neck stiffness and neck discomfort.  She is terrified that she is going to have more breast biopsies because she almost passed out last time.  She is unsure if she should get vaccine when it becomes available to her.  Several people in her church have died from [place].  She does not have a primary care doctor at present.  She has a new cardiologist who took her off statins and put her on co-Q10 but has not she says yet recheck her lipid panel.  Aside from all this a detailed review of systems today was stable   BREAST CANCER HISTORY: From the original intake note:  Maria Hester herself noted a change in her left breast when looking into a mirror and then palpating a mass in the upper-outer quadrant. She brought this to  medical attention and on 02/24/2017 underwent bilateral diagnostic mammography with tomography and left breast ultrasonography [facility]. The breast density was category C. In the upper portion of the left breast there was a broad area of distortion measuring up to 7.5 cm. On exam there was a broad firm area involving the upper outer quadrant of the left breast which was visibly protruding. By ultrasound at the 11:00 axis 8 cm from the nipple there was an irregular hypoechoic mass measuring 1.4 cm with additiona ases at 2:00, 3 cm from the nipple and multiple other masses as described, all in the upper-outer quadrant primarily. Ultrasound of the left axilla showed at least 3 prominent lymph nodes one of which had a cortical bulge.  On 03/02/2017 the patient underwent biopsy of a left breast mass at the 11:30 o'clock position and a second mass described as upper outer quadrant as well as one of the suspicious left axillary lymph nodes. These all showed invasive ductal carcinoma, grade 3. Prognostic panel from one of the 2 masses showed the tumor to be estrogen and progesterone receptor negative, but HER-2 amplified, with a signals ratio of 8.24 and the number per cell 15.65.  Her subsequent history is as detailed below   PAST MEDICAL HISTORY: Past Medical History:  Diagnosis Date  . Breast cancer ([facility]) 2018   Last chemo 0ct 2018, last radiation Jan. 2019  . 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 Cataracts, bilateral    bil cataracts  removed  . Complication of anesthesia    blood pressure very low after surgery difficult to wake -1st surgery only-when pt was 54 y.o.  . Family history of breast cancer   . Frozen shoulder    left  . Gallbladder disease    "sludge in gallbladder",   . Hiatal hernia   . History of hiatal hernia   . Hx of thyroid nodule    benign  . Hyperlipidemia   . Personal history of chemotherapy   .  Personal history of radiation therapy   . Thyroid disease    Thyroid nodules - bx was negative  . Uterine fibroid     PAST SURGICAL HISTORY: Past Surgical History:  Procedure Laterality Date  . ABDOMINAL HYSTERECTOMY    . ABDOMINOPLASTY N/A 02/28/2014   Procedure: ABDOMINOPLASTY with Panniculectomy;  Surgeon: [name], MD;  Location: [facility];  Service: Gynecology;  Laterality: N/A;  . APPENDECTOMY    . BILATERAL SALPINGECTOMY Bilateral 02/28/2014   Procedure: BILATERAL SALPINGECTOMY;  Surgeon: [name], MD;  Location: [facility];  Service: Gynecology;  Laterality: Bilateral;  . BREAST LUMPECTOMY    . BREAST LUMPECTOMY WITH RADIOACTIVE SEED AND SENTINEL LYMPH NODE BIOPSY Left 08/19/2017   Procedure: LEFT BREAST LUMPECTOMY WITH RADIOACTIVE SEED AND LEFT SENTINEL LYMPH NODE BIOPSY;  Surgeon: Excell Seltzer, MD;  Location: [facility];  Service: General;  Laterality: Left;  . CATARACT EXTRACTION, BILATERAL    . CHOLECYSTECTOMY N/A 08/19/2017   Procedure: LAPAROSCOPIC CHOLECYSTECTOMY WITH INTRAOPERATIVE CHOLANGIOGRAM;  Surgeon: Excell Seltzer, MD;  Location: [facility];  Service: General;  Laterality: N/A;  . PORTACATH PLACEMENT Right 03/18/2017   Procedure: INSERTION PORT-A-CATH;  Surgeon: Excell Seltzer, MD;  Location: [facility];  Service: General;  Laterality: Right;  . removal portacath  08/2017  . reverse tubal ligation    . SCAR REVISION N/A 02/28/2014   Procedure: SCAR REVISION;  Surgeon: [name], MD;  Location: [facility];  Service: Gynecology;  Laterality: N/A;  . SUPRACERVICAL ABDOMINAL HYSTERECTOMY N/A 02/28/2014   Procedure: HYSTERECTOMY SUPRACERVICAL ABDOMINAL;  Surgeon: [name], MD;  Location: [facility];  Service: Gynecology;  Laterality: N/A;  . TUBAL LIGATION    . WISDOM TOOTH EXTRACTION      FAMILY HISTORY Family History  Problem Relation Age of Onset  . Heart disease Mother   . Hypertension Mother   . Diabetes Father   . Heart disease Father   . Heart  disease Maternal Grandmother   . Colon cancer Maternal Grandmother 60       died in her 27's  . Heart disease Maternal Grandfather   . Esophageal cancer Maternal Grandfather 50       died at 51  . Diabetes Paternal Grandmother   . Heart disease Paternal Grandmother   . Kidney cancer Paternal Grandmother 68       died in her 63's  . Tuberculosis Paternal Grandfather   . Heart disease Paternal Grandfather   . Skin cancer Paternal Grandfather 97       died at 42, patient not sure if it was melanoma, BCC, Squamous, etc.  . Breast cancer Maternal Aunt 50       died at 58  . Breast cancer Cousin 28       she is now in her 33's  . Rectal cancer Neg Hx   . Stomach cancer Neg Hx    The patient's father died from heart disease at age 77. The patient's mother is living  at age 58. The patient has one brother, 2 sisters. On the mother's side and aunt had breast cancer at age 3 and her daughter had breast cancer in her early 86s. There is also a grandfather with esophageal cancer and a grandmother with colon cancer. On the paternal side there is a grandmother with kidney cancer at an early age and a grandfather with melanoma at an early age.   GYNECOLOGIC HISTORY:  Patient's last menstrual period was 02/11/2014.  Menarche age 38, first live birth age 66, the patient is Maria Hester. She underwent hysterectomy without salpingo-oophorectomy May 2016. She did not use hormone replacement. She did use oral contraceptives approximately 10 years in the 1980s.   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  She has a second job for [employer] so basically she is currently working from 8:30 in the morning to 10 PM in the evening.  Her husband Maria Hester is Surveyor, mining in the same [place], which has a 10% aviation enrollment. Daughter Maria Hester is a hairstylist in [place] and son Maria Hester is a Physiological scientist in [place]. The patient has 2 grandsons and  one granddaughter. She attends a [employer]   ADVANCED DIRECTIVES: The patient has completed advanced directives and will have them notarized   HEALTH MAINTENANCE: Social History   Tobacco Use  . Smoking status: Former Smoker    Packs/day: 0.50    Years: 34.00    Pack years: 17.00    Types: Cigarettes    Quit date: 10/13/2013    Years since quitting: 6.0  . Smokeless tobacco: Never Used  Substance Use Topics  . Alcohol use: No  . Drug use: No    Colonoscopy:No  PAP: 03/04/2017  Bone density: No   No Known Allergies  Current Outpatient Medications  Medication Sig Dispense Refill  . Coenzyme Q10 (COQ10 PO) Take 300 mg by mouth daily. Takes (2) 300 mg capsules daily    . esomeprazole (NEXIUM) 40 MG capsule TAKE 1 CAPSULE BY MOUTH TWICE A DAY BEFORE A MEAL 180 capsule 1  . latanoprost (XALATAN) 0.005 % ophthalmic solution Place 1 drop into both eyes at bedtime.     No current facility-administered medications for this visit.   Facility-Administered Medications Ordered in Other Visits  Medication Dose Route Frequency Provider Last Rate Last Admin  . heparin lock flush 100 unit/mL  500 Units Intracatheter Once Luvia Orzechowski, Virgie Dad, MD      . sodium chloride flush (NS) 0.9 % injection 10 mL  10 mL Intracatheter Once Kasyn Rolph, Virgie Dad, MD        OBJECTIVE: Middle-aged white woman who appears well  Vitals:   10/31/19 1045  BP: 111/65  Pulse: (!) 51  Resp: 18  Temp: 98.1 F (36.7 C)  SpO2: 99%     Body mass index is 33.6 kg/m.     ECOG FS:1 - Symptomatic but completely ambulatory  Sclerae unicteric, EOMs intact Wearing a mask No cervical or supraclavicular adenopathy Lungs no rales or rhonchi Heart regular rate and rhythm Abd soft, nontender, positive bowel sounds MSK no focal spinal tenderness, no upper extremity lymphedema Neuro: nonfocal, well oriented, appropriate affect Breasts: The right breast is unremarkable.  The left breast is status post lumpectomy and  radiation with no evidence of local recurrence.  Both axillae are benign.  Lesion in mid back January 2020    LAB RESULTS:  CMP     Component Value Date/Time  NA 141 10/31/2019 1008   NA 139 10/14/2017 1159   K 4.5 10/31/2019 1008   K 4.3 10/14/2017 1159   CL 108 10/31/2019 1008   CO2 26 10/31/2019 1008   CO2 25 10/14/2017 1159   GLUCOSE 81 10/31/2019 1008   GLUCOSE 92 10/14/2017 1159   BUN 19 10/31/2019 1008   BUN 15.9 10/14/2017 1159   CREATININE 0.74 10/31/2019 1008   CREATININE 0.7 10/14/2017 1159   CALCIUM 9.1 10/31/2019 1008   CALCIUM 9.3 10/14/2017 1159   PROT 6.8 10/31/2019 1008   PROT 6.8 10/14/2017 1159   ALBUMIN 3.7 10/31/2019 1008   ALBUMIN 3.6 10/14/2017 1159   AST 14 (L) 10/31/2019 1008   AST 20 10/14/2017 1159   ALT 14 10/31/2019 1008   ALT 23 10/14/2017 1159   ALKPHOS 102 10/31/2019 1008   ALKPHOS 83 10/14/2017 1159   BILITOT 0.2 (L) 10/31/2019 1008   BILITOT 0.23 10/14/2017 1159   GFRNONAA >60 10/31/2019 1008   GFRAA >60 10/31/2019 1008    No results found for: TOTALPROTELP, ALBUMINELP, A1GS, A2GS, BETS, BETA2SER, GAMS, MSPIKE, SPEI  No results found for: Nils Pyle, [facility]  Lab Results  Component Value Date   WBC 6.5 10/31/2019   NEUTROABS 3.8 10/31/2019   HGB 12.7 10/31/2019   HCT 39.3 10/31/2019   MCV 89.3 10/31/2019   PLT 292 10/31/2019      Chemistry      Component Value Date/Time   NA 141 10/31/2019 1008   NA 139 10/14/2017 1159   K 4.5 10/31/2019 1008   K 4.3 10/14/2017 1159   CL 108 10/31/2019 1008   CO2 26 10/31/2019 1008   CO2 25 10/14/2017 1159   BUN 19 10/31/2019 1008   BUN 15.9 10/14/2017 1159   CREATININE 0.74 10/31/2019 1008   CREATININE 0.7 10/14/2017 1159      Component Value Date/Time   CALCIUM 9.1 10/31/2019 1008   CALCIUM 9.3 10/14/2017 1159   ALKPHOS 102 10/31/2019 1008   ALKPHOS 83 10/14/2017 1159   AST 14 (L) 10/31/2019 1008   AST 20 10/14/2017 1159   ALT 14 10/31/2019 1008   ALT  23 10/14/2017 1159   BILITOT 0.2 (L) 10/31/2019 1008   BILITOT 0.23 10/14/2017 1159       No results found for: LABCA2  No components found for: [id number]  No results for input(s): INR in the last 168 hours.  Urinalysis    Component Value Date/Time   COLORURINE YELLOW 02/23/2014 1004   APPEARANCEUR CLEAR 02/23/2014 1004   LABSPEC 1.025 02/23/2014 1004   PHURINE 5.5 02/23/2014 1004   GLUCOSEU NEGATIVE 02/23/2014 1004   HGBUR NEGATIVE 02/23/2014 1004   BILIRUBINUR NEGATIVE 02/23/2014 1004   KETONESUR NEGATIVE 02/23/2014 1004   PROTEINUR NEGATIVE 02/23/2014 1004   UROBILINOGEN 0.2 02/23/2014 1004   NITRITE NEGATIVE 02/23/2014 1004   LEUKOCYTESUR NEGATIVE 02/23/2014 1004     STUDIES: No results found.    ELIGIBLE FOR AVAILABLE RESEARCH PROTOCOL: no  ASSESSMENT: 54 y.o. [place], [place] woman status post left breast overlapping sites biopsy 03/02/2017 for a clinical T3 N1-2, stage IIIA invasive ductal carcinoma, grade 3, estrogen and progesterone receptor negative, HER-2 amplified, with an MIB-1 of 35%  (1) genetics testing07/24/2018 through the Hereditary Gene Panel offered by [facility] found no deleterious mutations in APC, ATM, AXIN2, BARD1, BMPR1A, BRCA1, BRCA2, BRIP1, CDH1, CDKN2A (p14ARF), CDKN2A (p16INK4a), CHEK2, CTNNA1, DICER1, EPCAM (Deletion/duplication testing only), GREM1 (promoter region deletion/duplication testing only), KIT, MEN1, MLH1, MSH2, MSH3, MSH6, MUTYH,  NBN, NF1, NHTL1, PALB2, PDGFRA, PMS2, POLD1, POLE, PTEN, RAD50, RAD51C, RAD51D, SDHB, SDHC, SDHD, SMAD4, SMARCA4. STK11, TP53, TSC1, TSC2, and VHL.  The following genes were evaluated for sequence changes only: SDHA and HOXB13 c.251G>A variant only.  (2) neoadjuvant chemotherapy consisting of carboplatin and docetaxel given with trastuzumab and pertuzumab every 21 days for 6 cycles, started 03/31/2017, completed 07/14/2017  (a) Pertuzumab stopped after cycle 1 secondary to side effects     (b) Pertuzumab  resumed cycle 3, not at loading dose.  (3) trastuzumab continued to complete 6 months (final dose 09/22/2017)  (a) echocardiogram 03/20/2017 showed an ejection fraction of 55%  (b) echocardiogram 06/30/2017 shows an ejection fraction of 55%  (c) echocardiogram 09/30/2017 shows an ejection fraction of 55-60% range   (4) status post left lumpectomy and axillary lymph node dissection 08/19/2017 for a residual ypT1a ypN0 invasive ductal carcinoma, grade 2, with negative margins, repeat prognostic panel again estrogen and progesterone receptor negative, but HER-2 amplified  (5) adjuvant radiation 09/22/17 - 11/05/17 Site/dose:   Left breast treated to 50 Gy with 25 fx of 2 Gy                       Left supraclavicular region treated to 45 Gy with 25 fx of 1.8 Gy                      Left breast boost of 10 Gy with 5 fx of 2 Gy   PLAN: Esmae is now a little over 2 years out from definitive surgery for her breast cancer with no evidence of disease recurrence.  This is very favorable.  She is aware that she needs to exercise regularly.  The problem is when and where since she works such long hours and by the time the week and comes around she just wants to take a break.  I did reassure her that the neck discomfort she is having is very common and not related to her breast cancer.  I suggested she start doing some neck stretching exercises.  On the plus side she no longer has frozen shoulder.  I encouraged her to receive the COVID-19 vaccine as soon as it becomes available to her  She had a lesion in her mid back which she scratched off.  It is imaged above.  It appears entirely benign.  She will have her next mammogram in July.  I have entered those orders.  She will see me again in August and I will obtain a lipid panel for her at that visit.  She knows to call for any other issue that may develop before then.  Total encounter time 25 minutes.Sarajane Jews C. Itzael Liptak, MD  10/31/19 10:55  AM Medical Oncology and Hematology [facility] [place], [place] [address] Tel. [phone]    Fax. [phone]   I, Wilburn Mylar, am acting as scribe for Dr. Virgie Dad. Shaleah Nissley.  I, Lurline Del MD, have reviewed the above documentation for accuracy and completeness, and I agree with the above.    *Total Encounter Time as defined by the Centers for Medicare and Medicaid Services includes, in addition to the face-to-face time of a patient visit (documented in the note above) non-face-to-face time: obtaining and reviewing outside history, ordering and reviewing medications, tests or procedures, care coordination (communications with other health care professionals or caregivers) and documentation in the medical record.

## 2019-10-31 ENCOUNTER — Inpatient Hospital Stay: Payer: Managed Care, Other (non HMO)

## 2019-10-31 ENCOUNTER — Other Ambulatory Visit: Payer: Self-pay

## 2019-10-31 ENCOUNTER — Inpatient Hospital Stay: Payer: Managed Care, Other (non HMO) | Attending: Oncology | Admitting: Oncology

## 2019-10-31 VITALS — BP 111/65 | HR 51 | Temp 98.1°F | Resp 18 | Ht 65.0 in | Wt 201.9 lb

## 2019-10-31 DIAGNOSIS — Z923 Personal history of irradiation: Secondary | ICD-10-CM | POA: Diagnosis not present

## 2019-10-31 DIAGNOSIS — Z9221 Personal history of antineoplastic chemotherapy: Secondary | ICD-10-CM | POA: Diagnosis not present

## 2019-10-31 DIAGNOSIS — Z833 Family history of diabetes mellitus: Secondary | ICD-10-CM | POA: Insufficient documentation

## 2019-10-31 DIAGNOSIS — Z171 Estrogen receptor negative status [ER-]: Secondary | ICD-10-CM | POA: Insufficient documentation

## 2019-10-31 DIAGNOSIS — C50812 Malignant neoplasm of overlapping sites of left female breast: Secondary | ICD-10-CM | POA: Insufficient documentation

## 2019-10-31 DIAGNOSIS — Z808 Family history of malignant neoplasm of other organs or systems: Secondary | ICD-10-CM | POA: Insufficient documentation

## 2019-10-31 DIAGNOSIS — Z87891 Personal history of nicotine dependence: Secondary | ICD-10-CM | POA: Diagnosis not present

## 2019-10-31 DIAGNOSIS — Z803 Family history of malignant neoplasm of breast: Secondary | ICD-10-CM | POA: Insufficient documentation

## 2019-10-31 DIAGNOSIS — Z79899 Other long term (current) drug therapy: Secondary | ICD-10-CM | POA: Diagnosis not present

## 2019-10-31 DIAGNOSIS — Z8051 Family history of malignant neoplasm of kidney: Secondary | ICD-10-CM | POA: Insufficient documentation

## 2019-10-31 DIAGNOSIS — C773 Secondary and unspecified malignant neoplasm of axilla and upper limb lymph nodes: Secondary | ICD-10-CM | POA: Insufficient documentation

## 2019-10-31 DIAGNOSIS — Z800 Family history of malignant neoplasm of digestive organs: Secondary | ICD-10-CM | POA: Insufficient documentation

## 2019-10-31 DIAGNOSIS — Z836 Family history of other diseases of the respiratory system: Secondary | ICD-10-CM | POA: Insufficient documentation

## 2019-10-31 DIAGNOSIS — Z8249 Family history of ischemic heart disease and other diseases of the circulatory system: Secondary | ICD-10-CM | POA: Insufficient documentation

## 2019-10-31 DIAGNOSIS — Z90711 Acquired absence of uterus with remaining cervical stump: Secondary | ICD-10-CM

## 2019-10-31 LAB — CBC WITH DIFFERENTIAL/PLATELET
Abs Immature Granulocytes: 0.01 10*3/uL (ref 0.00–0.07)
Basophils Absolute: 0 10*3/uL (ref 0.0–0.1)
Basophils Relative: 1 %
Eosinophils Absolute: 0.1 10*3/uL (ref 0.0–0.5)
Eosinophils Relative: 1 %
HCT: 39.3 % (ref 36.0–46.0)
Hemoglobin: 12.7 g/dL (ref 12.0–15.0)
Immature Granulocytes: 0 %
Lymphocytes Relative: 32 %
Lymphs Abs: 2.1 10*3/uL (ref 0.7–4.0)
MCH: 28.9 pg (ref 26.0–34.0)
MCHC: 32.3 g/dL (ref 30.0–36.0)
MCV: 89.3 fL (ref 80.0–100.0)
Monocytes Absolute: 0.5 10*3/uL (ref 0.1–1.0)
Monocytes Relative: 8 %
Neutro Abs: 3.8 10*3/uL (ref 1.7–7.7)
Neutrophils Relative %: 58 %
Platelets: 292 10*3/uL (ref 150–400)
RBC: 4.4 MIL/uL (ref 3.87–5.11)
RDW: 13.5 % (ref 11.5–15.5)
WBC: 6.5 10*3/uL (ref 4.0–10.5)
nRBC: 0 % (ref 0.0–0.2)

## 2019-10-31 LAB — COMPREHENSIVE METABOLIC PANEL
ALT: 14 U/L (ref 0–44)
AST: 14 U/L — ABNORMAL LOW (ref 15–41)
Albumin: 3.7 g/dL (ref 3.5–5.0)
Alkaline Phosphatase: 102 U/L (ref 38–126)
Anion gap: 7 (ref 5–15)
BUN: 19 mg/dL (ref 6–20)
CO2: 26 mmol/L (ref 22–32)
Calcium: 9.1 mg/dL (ref 8.9–10.3)
Chloride: 108 mmol/L (ref 98–111)
Creatinine, Ser: 0.74 mg/dL (ref 0.44–1.00)
GFR calc Af Amer: >60 mL/min (ref >60)
GFR calc non Af Amer: >60 mL/min (ref >60)
Glucose, Bld: 81 mg/dL (ref 70–99)
Potassium: 4.5 mmol/L (ref 3.5–5.1)
Sodium: 141 mmol/L (ref 135–145)
Total Bilirubin: 0.2 mg/dL — ABNORMAL LOW (ref 0.3–1.2)
Total Protein: 6.8 g/dL (ref 6.5–8.1)

## 2019-11-01 ENCOUNTER — Telehealth: Payer: Self-pay | Admitting: Oncology

## 2019-11-01 NOTE — Telephone Encounter (Signed)
I left a message regarding schedule  

## 2020-02-02 ENCOUNTER — Other Ambulatory Visit: Payer: Self-pay | Admitting: Internal Medicine

## 2020-04-24 ENCOUNTER — Encounter: Payer: Self-pay | Admitting: Physician Assistant

## 2020-05-09 ENCOUNTER — Other Ambulatory Visit: Payer: Self-pay | Admitting: Internal Medicine

## 2020-05-21 ENCOUNTER — Ambulatory Visit
Admission: RE | Admit: 2020-05-21 | Discharge: 2020-05-21 | Disposition: A | Discharge status: Home / Self Care | Payer: 59 | Source: Ambulatory Visit | Attending: Oncology | Admitting: Oncology

## 2020-05-21 ENCOUNTER — Other Ambulatory Visit: Payer: Self-pay

## 2020-05-21 DIAGNOSIS — Z90711 Acquired absence of uterus with remaining cervical stump: Secondary | ICD-10-CM

## 2020-05-21 DIAGNOSIS — C50812 Malignant neoplasm of overlapping sites of left female breast: Secondary | ICD-10-CM

## 2020-05-23 ENCOUNTER — Ambulatory Visit (INDEPENDENT_AMBULATORY_CARE_PROVIDER_SITE_OTHER): Payer: 59 | Admitting: Physician Assistant

## 2020-05-23 ENCOUNTER — Other Ambulatory Visit (INDEPENDENT_AMBULATORY_CARE_PROVIDER_SITE_OTHER): Payer: 59

## 2020-05-23 ENCOUNTER — Encounter: Payer: Self-pay | Admitting: Physician Assistant

## 2020-05-23 VITALS — BP 110/62 | HR 52 | Ht 65.0 in | Wt 213.0 lb

## 2020-05-23 DIAGNOSIS — R1084 Generalized abdominal pain: Secondary | ICD-10-CM | POA: Diagnosis not present

## 2020-05-23 DIAGNOSIS — R197 Diarrhea, unspecified: Secondary | ICD-10-CM

## 2020-05-23 DIAGNOSIS — R110 Nausea: Secondary | ICD-10-CM | POA: Diagnosis not present

## 2020-05-23 LAB — COMPREHENSIVE METABOLIC PANEL
ALT: 20 U/L (ref 0–35)
AST: 19 U/L (ref 0–37)
Albumin: 4.3 g/dL (ref 3.5–5.2)
Alkaline Phosphatase: 95 U/L (ref 39–117)
BUN: 15 mg/dL (ref 6–23)
CO2: 26 mEq/L (ref 19–32)
Calcium: 10 mg/dL (ref 8.4–10.5)
Chloride: 104 mEq/L (ref 96–112)
Creatinine, Ser: 0.73 mg/dL (ref 0.40–1.20)
GFR: 83.56 mL/min (ref >60.00)
Glucose, Bld: 91 mg/dL (ref 70–99)
Potassium: 4.7 mEq/L (ref 3.5–5.1)
Sodium: 142 mEq/L (ref 135–145)
Total Bilirubin: 0.3 mg/dL (ref 0.2–1.2)
Total Protein: 7.2 g/dL (ref 6.0–8.3)

## 2020-05-23 LAB — CBC WITH DIFFERENTIAL/PLATELET
Basophils Absolute: 0 10*3/uL (ref 0.0–0.1)
Basophils Relative: 0.7 % (ref 0.0–3.0)
Eosinophils Absolute: 0.1 10*3/uL (ref 0.0–0.7)
Eosinophils Relative: 1.3 % (ref 0.0–5.0)
HCT: 38.3 % (ref 36.0–46.0)
Hemoglobin: 12.9 g/dL (ref 12.0–15.0)
Lymphocytes Relative: 32.5 % (ref 12.0–46.0)
Lymphs Abs: 2.4 10*3/uL (ref 0.7–4.0)
MCHC: 33.6 g/dL (ref 30.0–36.0)
MCV: 86 fl (ref 78.0–100.0)
Monocytes Absolute: 0.6 10*3/uL (ref 0.1–1.0)
Monocytes Relative: 8.9 % (ref 3.0–12.0)
Neutro Abs: 4.1 10*3/uL (ref 1.4–7.7)
Neutrophils Relative %: 56.6 % (ref 43.0–77.0)
Platelets: 321 10*3/uL (ref 150.0–400.0)
RBC: 4.46 Mil/uL (ref 3.87–5.11)
RDW: 14.1 % (ref 11.5–15.5)
WBC: 7.3 10*3/uL (ref 4.0–10.5)

## 2020-05-23 LAB — C-REACTIVE PROTEIN: CRP: <1 mg/dL (ref 0.5–20.0)

## 2020-05-23 LAB — SEDIMENTATION RATE: Sed Rate: 31 mm/hr — ABNORMAL HIGH (ref 0–30)

## 2020-05-23 LAB — LIPASE: Lipase: 31 U/L (ref 11.0–59.0)

## 2020-05-23 NOTE — Patient Instructions (Addendum)
Your provider has requested that you go to the basement level for lab work before leaving today. Press "B" on the elevator. The lab is located at the first door on the left as you exit the elevator.  Take over the counter Imodium per package instructions.   You have been scheduled for a CT scan of the abdomen and pelvis at [facility] ([address]---this is in the same building as [facility] [employer]).   You are scheduled on 05/28/20 at 3:00pm. You should arrive 15 minutes prior to your appointment time for registration. Please follow the written instructions below on the day of your exam:  WARNING: IF YOU ARE ALLERGIC TO IODINE/X-RAY DYE, PLEASE NOTIFY RADIOLOGY IMMEDIATELY AT [phone]! YOU WILL BE GIVEN A 13 HOUR PREMEDICATION PREP.  1) Do not eat or drink anything after 11:00am (4 hours prior to your test) 2) You have been given 2 bottles of oral contrast to drink. The solution may taste better if refrigerated, but do NOT add ice or any other liquid to this solution. Shake well before drinking.    Drink 1 bottle of contrast @ 1:00pm (2 hours prior to your exam)  Drink 1 bottle of contrast @ 2:00pm (1 hour prior to your exam)  You may take any medications as prescribed with a small amount of water, if necessary. If you take any of the following medications: METFORMIN, GLUCOPHAGE, GLUCOVANCE, AVANDAMET, RIOMET, FORTAMET, [place] MET, JANUMET, GLUMETZA or METAGLIP, you MAY be asked to HOLD this medication 48 hours AFTER the exam.  The purpose of you drinking the oral contrast is to aid in the visualization of your intestinal tract. The contrast solution may cause some diarrhea. Depending on your individual set of symptoms, you may also receive an intravenous injection of x-ray contrast/dye. Plan on being at [facility] for 30 minutes or longer, depending on the type of exam you are having performed.  This test typically takes 30-45 minutes to complete.  If you  have any questions regarding your exam or if you need to reschedule, you may call the CT department at [phone] between the hours of 8:00 am and 5:00 pm, Monday-Friday.  ____________________________________________________________

## 2020-05-23 NOTE — Progress Notes (Addendum)
Chief Complaint: Diarrhea, abdominal pain, nausea  HPI:    Maria Hester is a 54 year old Caucasian female with a past medical history as listed below including breast cancer and cholecystectomy 2 years ago, known to Dr. Hilarie Fredrickson, who presents clinic today as a referral from Dr. Laney Pastor for complaint of diarrhea, abdominal pain and nausea.    06/24/2018 colonoscopy for screening with 1 5 mm polyp in the transverse colon and otherwise normal.  Pathology showed tubular adenoma and repeat was recommended in 5 years.    Today, the patient tells me that she was on vacation in May and ate a burger which seemed off, she woke up that evening with intense abdominal pain and started having diarrhea.  Since then her symptoms have remained constant.  She tried to wait about a month to see if these things would wear off after just "eating something bad", but every time she eats anything her stomach bloats until she has diarrhea.  This happens all day long.  In the morning she has 3 liquid bowel movements sometimes with mucus just before leaving work.  In the evening sometimes she will have soft formed stool, but this is not very often and they are typically followed by diarrhea.  Over the past few weeks she has now started with nausea, she feels like in anticipation of the abdominal pain she will have.  Describes these bowel movements as very urgent and has a lot of "loud noises in my stomach".  Also describes that she does not feel like her Nexium 40 mg twice daily is as effective as it was before.  She has only 7-8 bowel movements a day with no blood.  Apparently saw her PCP who did some stool studies about 2 months ago and she was told they were negative, we do not have records.  He started her on antibiotics for 10 days but she could only handle this for 8 days due to headache.  She also tried Pepto which has made no change in her symptoms.  Patient feels as though she cannot go anywhere and is constantly  uncomfortable.    Denies fever, chills, weight loss, blood in her stool or symptoms that awaken her from sleep.  Past Medical History:  Diagnosis Date  . Breast cancer ([place]) 2018   Last chemo 0ct 2018, last radiation Jan. 2019  . 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 Cataracts, bilateral    bil cataracts removed  . Complication of anesthesia    blood pressure very low after surgery difficult to wake -1st surgery only-when pt was 54 y.o.  . Family history of breast cancer   . Frozen shoulder    left  . Gallbladder disease    "sludge in gallbladder",   . Glaucoma   . Hiatal hernia   . History of hiatal hernia   . Hx of thyroid nodule    benign  . Hyperlipidemia   . Personal history of chemotherapy   . Personal history of radiation therapy   . Thyroid disease    Thyroid nodules - bx was negative  . Uterine fibroid     Past Surgical History:  Procedure Laterality Date  . ABDOMINAL HYSTERECTOMY    . ABDOMINOPLASTY N/A 02/28/2014   Procedure: ABDOMINOPLASTY with Panniculectomy;  Surgeon: [name], MD;  Location: [facility];  Service: Gynecology;  Laterality: N/A;  . APPENDECTOMY    . BILATERAL SALPINGECTOMY Bilateral 02/28/2014  Procedure: BILATERAL SALPINGECTOMY;  Surgeon: [name], MD;  Location: [facility];  Service: Gynecology;  Laterality: Bilateral;  . BREAST BIOPSY Bilateral 08/16/2018  . BREAST LUMPECTOMY Left 2018  . BREAST LUMPECTOMY WITH RADIOACTIVE SEED AND SENTINEL LYMPH NODE BIOPSY Left 08/19/2017   Procedure: LEFT BREAST LUMPECTOMY WITH RADIOACTIVE SEED AND LEFT SENTINEL LYMPH NODE BIOPSY;  Surgeon: Excell Seltzer, MD;  Location: [facility];  Service: General;  Laterality: Left;  . CATARACT EXTRACTION, BILATERAL    . CHOLECYSTECTOMY N/A 08/19/2017   Procedure: LAPAROSCOPIC CHOLECYSTECTOMY WITH INTRAOPERATIVE CHOLANGIOGRAM;  Surgeon: Excell Seltzer, MD;  Location: [facility];  Service: General;   Laterality: N/A;  . PORTACATH PLACEMENT Right 03/18/2017   Procedure: INSERTION PORT-A-CATH;  Surgeon: Excell Seltzer, MD;  Location: [facility];  Service: General;  Laterality: Right;  . removal portacath  08/2017  . reverse tubal ligation    . SCAR REVISION N/A 02/28/2014   Procedure: SCAR REVISION;  Surgeon: [name], MD;  Location: [facility];  Service: Gynecology;  Laterality: N/A;  . SUPRACERVICAL ABDOMINAL HYSTERECTOMY N/A 02/28/2014   Procedure: HYSTERECTOMY SUPRACERVICAL ABDOMINAL;  Surgeon: [name], MD;  Location: [facility];  Service: Gynecology;  Laterality: N/A;  . TUBAL LIGATION    . WISDOM TOOTH EXTRACTION      Current Outpatient Medications  Medication Sig Dispense Refill  . Coenzyme Q10 (COQ10 PO) Take 300 mg by mouth daily. Takes (2) 300 mg capsules daily    . esomeprazole (NEXIUM) 40 MG capsule TAKE 1 CAPSULE BY MOUTH TWICE A DAY BEFORE A MEAL 180 capsule 0  . latanoprost (XALATAN) 0.005 % ophthalmic solution Place 1 drop into both eyes at bedtime.     No current facility-administered medications for this visit.   Facility-Administered Medications Ordered in Other Visits  Medication Dose Route Frequency Provider Last Rate Last Admin  . heparin lock flush 100 unit/mL  500 Units Intracatheter Once Magrinat, Virgie Dad, MD      . sodium chloride flush (NS) 0.9 % injection 10 mL  10 mL Intracatheter Once Magrinat, Virgie Dad, MD        Allergies as of 05/23/2020  . (No Known Allergies)    Family History  Problem Relation Age of Onset  . Heart disease Mother   . Hypertension Mother   . Diabetes Father   . Heart disease Father   . Heart disease Maternal Grandmother   . Colon cancer Maternal Grandmother 69       died in her 24's  . Heart disease Maternal Grandfather   . Esophageal cancer Maternal Grandfather 50       died at 80  . Diabetes Paternal Grandmother   . Heart disease Paternal Grandmother   . Kidney cancer Paternal Grandmother 25       died in her  44's  . Tuberculosis Paternal Grandfather   . Heart disease Paternal Grandfather   . Skin cancer Paternal Grandfather 44       died at 55, patient not sure if it was melanoma, BCC, Squamous, etc.  . Breast cancer Maternal Aunt 50       died at 69  . Breast cancer Cousin 41       she is now in her 79's  . Rectal cancer Neg Hx   . Stomach cancer Neg Hx     Social History   Socioeconomic History  . Marital status: Married    Spouse name: Not on file  . Number of children: Not on file  .  Years of education: Not on file  . Highest education level: Not on file  Occupational History  . Not on file  Tobacco Use  . Smoking status: Former Smoker    Packs/day: 0.50    Years: 34.00    Pack years: 17.00    Types: Cigarettes    Quit date: 10/13/2013    Years since quitting: 6.6  . Smokeless tobacco: Never Used  Vaping Use  . Vaping Use: Never used  Substance and Sexual Activity  . Alcohol use: No  . Drug use: No  . Sexual activity: Not Currently    Birth control/protection: Surgical    Comment: hyst  Other Topics Concern  . Not on file  Social History Narrative  . Not on file   Social Determinants of Health   Financial Resource Strain:   . Difficulty of Paying Living Expenses:   Food Insecurity:   . Worried About Charity fundraiser in the Last Year:   . Arboriculturist in the Last Year:   Transportation Needs:   . Film/video editor (Medical):   Marland Kitchen Lack of Transportation (Non-Medical):   Physical Activity:   . Days of Exercise per Week:   . Minutes of Exercise per Session:   Stress:   . Feeling of Stress :   Social Connections:   . Frequency of Communication with Friends and Family:   . Frequency of Social Gatherings with Friends and Family:   . Attends Religious Services:   . Active Member of Clubs or Organizations:   . Attends Archivist Meetings:   Marland Kitchen Marital Status:   Intimate Partner Violence:   . Fear of Current or Ex-Partner:   . Emotionally  Abused:   Marland Kitchen Physically Abused:   . Sexually Abused:     Review of Systems:    Constitutional: No weight loss, fever or chills Cardiovascular: No chest pain Respiratory: No SOB Gastrointestinal: See HPI and otherwise negative   Physical Exam:  Vital signs: BP 110/62   Pulse (!) 52   Ht _0  (1.651 m)   Wt 213 lb (96.6 kg)   LMP 02/11/2014 Comment: [facility]  BMI 35.45 kg/m   Constitutional:   Pleasant Caucasian female appears to be in NAD, Well developed, Well nourished, alert and cooperative Respiratory: Respirations even and unlabored. Lungs clear to auscultation bilaterally.   No wheezes, crackles, or rhonchi.  Cardiovascular: Normal S1, S2. No MRG. Regular rate and rhythm. No peripheral edema, cyanosis or pallor.  Gastrointestinal:  Soft, mild distention, generalized moderate tenderness with involuntary guarding, some worse in bilateral lower quadrants.  Increased bowel sounds all 4 quadrants, no appreciable masses or hepatomegaly. Rectal:  Not performed.  Psychiatric: Demonstrates good judgement and reason without abnormal affect or behaviors.  No recent labs or imaging.  Assessment: 1.  Generalized abdominal pain: For the past 3 months with a change towards loose urgent stools, apparently given trial of antibiotic by PCP which did not help, status post cholecystectomy 2 years ago; consider infectious versus inflammatory versus parasitic cause versus other 2.  Change in bowel habits: As above 3.  Nausea: With all of the above  Plan: 1.  Ordered stool studies to include a GI pathogen panel, O&P, lactoferrin, pancreatic elastase 2.  Ordered blood work to include a CBC, CMP, lipase, CRP and ESR 3.  Recommend the patient trial over-the-counter Imodium per package instructions for now 4.  Ordered a CT of the abdomen and pelvis with contrast for further  evaluation of abdominal pain and diarrhea. 5.  Requesting records from PCP. 6.  Patient to follow in clinic with me in 3 to 4 weeks  or sooner as indicated by labs and imaging above.  Ellouise Newer, PA-C [facility] 05/23/2020, 8:33 AM  Addendum: 06/05/2020 11:15 AM  Received labs.  03/22/2020 Salmonella, Campylobacter and E. coli negative.  C. difficile negative.  Giardia negative.  Rotavirus negative.  Ova and parasites negative x1.  No change to plans.  Ellouise Newer, PA-C

## 2020-05-28 ENCOUNTER — Ambulatory Visit (INDEPENDENT_AMBULATORY_CARE_PROVIDER_SITE_OTHER)
Admission: RE | Admit: 2020-05-28 | Discharge: 2020-05-28 | Disposition: A | Discharge status: Home / Self Care | Payer: 59 | Source: Ambulatory Visit | Attending: Physician Assistant | Admitting: Physician Assistant

## 2020-05-28 ENCOUNTER — Other Ambulatory Visit: Payer: 59

## 2020-05-28 ENCOUNTER — Other Ambulatory Visit: Payer: Self-pay

## 2020-05-28 DIAGNOSIS — R197 Diarrhea, unspecified: Secondary | ICD-10-CM

## 2020-05-28 DIAGNOSIS — R1084 Generalized abdominal pain: Secondary | ICD-10-CM | POA: Diagnosis not present

## 2020-05-28 DIAGNOSIS — R110 Nausea: Secondary | ICD-10-CM

## 2020-05-28 MED ORDER — IOHEXOL 300 MG/ML  SOLN
100.0000 mL | Freq: Once | INTRAMUSCULAR | Status: AC | PRN
  Administered 2020-05-28: 100 mL via INTRAVENOUS

## 2020-05-29 NOTE — Progress Notes (Signed)
[place]  [phone] Fax:(336) [phone]    ID: Maria Hester DOB: 03/04/1966  MR#: [id number]  WNI#:[id number]  Patient Care Team: Maria Neu, MD as PCP - General (Family Medicine) Maria Seltzer, MD (Inactive) as Consulting Physician (General Surgery) Maria Hester, Maria Dad, MD as Consulting Physician (Oncology) Maria Gibson, MD as Attending Physician (Radiation Oncology) Maria Sams, MD as Referring Physician (Internal Medicine) OTHER MD:   CHIEF COMPLAINT: HER-2 positive estrogen receptor negative breast cancer  CURRENT TREATMENT: observation   INTERVAL HISTORY: Maria Hester returns today for follow-up of her estrogen receptor negative, HER2 positive breast cancer. The patient continues under observation.   Since her last visit, she underwent bilateral diagnostic mammography with tomography at [facility] on 05/21/2020 showing: breast density category B; no evidence of malignancy in either breast.    She also underwent CT abdomen/pelvis on 05/28/2020 for diarrhea, nausea/vomiting, and abdominal pain. This showed: no acute abdominopelvic process, no findings to explain patient's symptoms; hepatic steatosis; aortic and iliac atheromatous disease.   REVIEW OF SYSTEMS: Maria Hester's diarrhea dates back to her trip to [place].  She had a meal that did not taste right.  The next day she started having diarrhea.  Her husband had no symptoms.  She saw her primary care physician who obtained some labs which were nondiagnostic and put her on Flagyl empirically for 8 days with no success.  She is now being evaluated through [facility] and so far labs are nondiagnostic as well as a CT of the abdomen.  She has a liquid mucosae bowel movement early in the morning and then another 6 or 7 pretty loose almost liquid bowel movements the rest of the day.  She is also getting some nausea.  When she takes Imodium it slows down to 2 bowel movements  daily but then she feels very bloated.  She has received the majority of vaccine x2.  Aside from these issues a detailed review of systems today was stable.   BREAST CANCER HISTORY: From the original intake note:  [name] herself noted a change in her left breast when looking into a mirror and then palpating a mass in the upper-outer quadrant. She brought this to medical attention and on 02/24/2017 underwent bilateral diagnostic mammography with tomography and left breast ultrasonography at the [facility] Center. The breast density was category C. In the upper portion of the left breast there was a broad area of distortion measuring up to 7.5 cm. On exam there was a broad firm area involving the upper outer quadrant of the left breast which was visibly protruding. By ultrasound at the 11:00 axis 8 cm from the nipple there was an irregular hypoechoic mass measuring 1.4 cm with additiona ases at 2:00, 3 cm from the nipple and multiple other masses as described, all in the upper-outer quadrant primarily. Ultrasound of the left axilla showed at least 3 prominent lymph nodes one of which had a cortical bulge.  On 03/02/2017 the patient underwent biopsy of a left breast mass at the 11:30 o'clock position and a second mass described as upper outer quadrant as well as one of the suspicious left axillary lymph nodes. These all showed invasive ductal carcinoma, grade 3. Prognostic panel from one of the 2 masses showed the tumor to be estrogen and progesterone receptor negative, but HER-2 amplified, with a signals ratio of 8.24 and the number per cell 15.65.  Her subsequent history is as detailed below   PAST MEDICAL HISTORY: Past  Medical History:  Diagnosis Date  . Breast cancer ([place] [facility]) 2018   Last chemo 0ct 2018, last radiation Jan. 2019  . 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 Cataracts, bilateral    bil cataracts removed  . Complication of anesthesia     blood pressure very low after surgery difficult to wake -1st surgery only-when pt was 54 y.o.  . Family history of breast cancer   . Frozen shoulder    left  . Gallbladder disease    "sludge in gallbladder",   . Glaucoma   . Hiatal hernia   . History of hiatal hernia   . Hx of thyroid nodule    benign  . Hyperlipidemia   . Personal history of chemotherapy   . Personal history of radiation therapy   . Thyroid disease    Thyroid nodules - bx was negative  . Uterine fibroid     PAST SURGICAL HISTORY: Past Surgical History:  Procedure Laterality Date  . ABDOMINAL HYSTERECTOMY    . ABDOMINOPLASTY N/A 02/28/2014   Procedure: ABDOMINOPLASTY with Panniculectomy;  Surgeon: Maria Kind, MD;  Location: [facility];  Service: Gynecology;  Laterality: N/A;  . APPENDECTOMY    . BILATERAL SALPINGECTOMY Bilateral 02/28/2014   Procedure: BILATERAL SALPINGECTOMY;  Surgeon: Maria Kind, MD;  Location: [facility];  Service: Gynecology;  Laterality: Bilateral;  . BREAST BIOPSY Bilateral 08/16/2018  . BREAST LUMPECTOMY Left 2018  . BREAST LUMPECTOMY WITH RADIOACTIVE SEED AND SENTINEL LYMPH NODE BIOPSY Left 08/19/2017   Procedure: LEFT BREAST LUMPECTOMY WITH RADIOACTIVE SEED AND LEFT SENTINEL LYMPH NODE BIOPSY;  Surgeon: Maria Seltzer, MD;  Location: [facility];  Service: General;  Laterality: Left;  . CATARACT EXTRACTION, BILATERAL    . CHOLECYSTECTOMY N/A 08/19/2017   Procedure: LAPAROSCOPIC CHOLECYSTECTOMY WITH INTRAOPERATIVE CHOLANGIOGRAM;  Surgeon: Maria Seltzer, MD;  Location: [facility];  Service: General;  Laterality: N/A;  . PORTACATH PLACEMENT Right 03/18/2017   Procedure: INSERTION PORT-A-CATH;  Surgeon: Maria Seltzer, MD;  Location: [facility];  Service: General;  Laterality: Right;  . removal portacath  08/2017  . reverse tubal ligation    . SCAR REVISION N/A 02/28/2014   Procedure: SCAR REVISION;  Surgeon: Maria Kind, MD;  Location: [facility];  Service: Gynecology;  Laterality: N/A;  .  SUPRACERVICAL ABDOMINAL HYSTERECTOMY N/A 02/28/2014   Procedure: HYSTERECTOMY SUPRACERVICAL ABDOMINAL;  Surgeon: Maria Kind, MD;  Location: [facility];  Service: Gynecology;  Laterality: N/A;  . TUBAL LIGATION    . WISDOM TOOTH EXTRACTION      FAMILY HISTORY Family History  Problem Relation Age of Onset  . Heart disease Mother   . Hypertension Mother   . Diabetes Father   . Heart disease Father   . Heart disease Maternal Grandmother   . Colon cancer Maternal Grandmother 26       died in her 74's  . Heart disease Maternal Grandfather   . Esophageal cancer Maternal Grandfather 50       died at 6  . Diabetes Paternal Grandmother   . Heart disease Paternal Grandmother   . Kidney cancer Paternal Grandmother 37       died in her 40's  . Tuberculosis Paternal Grandfather   . Heart disease Paternal Grandfather   . Skin cancer Paternal Grandfather 69       died at 40, patient not sure if it was melanoma, BCC, Squamous, etc.  . Breast cancer Maternal Aunt 74  died at 68  . Breast cancer Cousin 16       she is now in her 55's  . Rectal cancer Neg Hx   . Stomach cancer Neg Hx    The patient's father died from heart disease at age 4. The patient's mother is living at age 47. The patient has one brother, 2 sisters. On the mother's side and aunt had breast cancer at age 62 and her daughter had breast cancer in her early 30s. There is also a grandfather with esophageal cancer and a grandmother with colon cancer. On the paternal side there is a grandmother with kidney cancer at an early age and a grandfather with melanoma at an early age.   GYNECOLOGIC HISTORY:  Patient's last menstrual period was 02/11/2014.  Menarche age 76, first live birth age 47, the patient is [place] P2. She underwent hysterectomy without salpingo-oophorectomy May 2016. She did not use hormone replacement. She did use oral contraceptives approximately 10 years in the 1980s.   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  She has a second job for [employer] so basically she is currently working from 8:30 in the morning to 10 PM in the evening.  Her husband Darnelle Maffucci is Surveyor, mining in the same [place], which has a 10% aviation enrollment. Daughter Lorenda Hatchet is a hairstylist in [place] and son Chip Boer is a Physiological scientist in [place]. The patient has 2 grandsons and one granddaughter. She attends a [employer]   ADVANCED DIRECTIVES: The patient has completed advanced directives and will have them notarized   HEALTH MAINTENANCE: Social History   Tobacco Use  . Smoking status: Former Smoker    Packs/day: 0.50    Years: 34.00    Pack years: 17.00    Types: Cigarettes    Quit date: 10/13/2013    Years since quitting: 6.6  . Smokeless tobacco: Never Used  Vaping Use  . Vaping Use: Never used  Substance Use Topics  . Alcohol use: No  . Drug use: No    Colonoscopy:No  PAP: 03/04/2017  Bone density: No   No Known Allergies  Current Outpatient Medications  Medication Sig Dispense Refill  . Coenzyme Q10 (COQ10 PO) Take 300 mg by mouth daily. Takes (2) 300 mg capsules daily    . esomeprazole (NEXIUM) 40 MG capsule TAKE 1 CAPSULE BY MOUTH TWICE A DAY BEFORE A MEAL 180 capsule 0  . latanoprost (XALATAN) 0.005 % ophthalmic solution Place 1 drop into both eyes at bedtime.     No current facility-administered medications for this visit.   Facility-Administered Medications Ordered in Other Visits  Medication Dose Route Frequency Provider Last Rate Last Admin  . heparin lock flush 100 unit/mL  500 Units Intracatheter Once Mearl Olver, Maria Dad, MD      . sodium chloride flush (NS) 0.9 % injection 10 mL  10 mL Intracatheter Once Long Brimage, Maria Dad, MD        OBJECTIVE: White woman in no acute distress  Vitals:   05/30/20 0938  BP: 126/77  Pulse: 62  Resp: 18  Temp: 97.6 F (36.4 C)  SpO2: 100%     Body mass index is 35.56 kg/m.       ECOG FS:1 - Symptomatic but completely ambulatory  Sclerae unicteric, EOMs intact Wearing a mask No cervical or supraclavicular adenopathy Lungs no rales or rhonchi Heart regular rate and rhythm Abd soft, nontender, positive bowel sounds, no masses  palpated MSK no focal spinal tenderness, no upper extremity lymphedema Neuro: nonfocal, well oriented, appropriate affect Breasts: The right breast is benign.  The left breast is status post lumpectomy and radiation.  There is no evidence of local recurrence.  Both axillae are benign.  LAB RESULTS:  CMP     Component Value Date/Time   NA 142 05/23/2020 0914   NA 139 10/14/2017 1159   K 4.7 05/23/2020 0914   K 4.3 10/14/2017 1159   CL 104 05/23/2020 0914   CO2 26 05/23/2020 0914   CO2 25 10/14/2017 1159   GLUCOSE 91 05/23/2020 0914   GLUCOSE 92 10/14/2017 1159   BUN 15 05/23/2020 0914   BUN 15.9 10/14/2017 1159   CREATININE 0.73 05/23/2020 0914   CREATININE 0.7 10/14/2017 1159   CALCIUM 10.0 05/23/2020 0914   CALCIUM 9.3 10/14/2017 1159   PROT 7.2 05/23/2020 0914   PROT 6.8 10/14/2017 1159   ALBUMIN 4.3 05/23/2020 0914   ALBUMIN 3.6 10/14/2017 1159   AST 19 05/23/2020 0914   AST 20 10/14/2017 1159   ALT 20 05/23/2020 0914   ALT 23 10/14/2017 1159   ALKPHOS 95 05/23/2020 0914   ALKPHOS 83 10/14/2017 1159   BILITOT 0.3 05/23/2020 0914   BILITOT 0.23 10/14/2017 1159   GFRNONAA >60 10/31/2019 1008   GFRAA >60 10/31/2019 1008    No results found for: TOTALPROTELP, ALBUMINELP, A1GS, A2GS, BETS, BETA2SER, GAMS, MSPIKE, SPEI  No results found for: Nils Pyle, [facility]  Lab Results  Component Value Date   WBC 7.3 05/23/2020   NEUTROABS 4.1 05/23/2020   HGB 12.9 05/23/2020   HCT 38.3 05/23/2020   MCV 86.0 05/23/2020   PLT 321.0 05/23/2020      Chemistry      Component Value Date/Time   NA 142 05/23/2020 0914   NA 139 10/14/2017 1159   K 4.7 05/23/2020 0914   K 4.3 10/14/2017 1159   CL 104  05/23/2020 0914   CO2 26 05/23/2020 0914   CO2 25 10/14/2017 1159   BUN 15 05/23/2020 0914   BUN 15.9 10/14/2017 1159   CREATININE 0.73 05/23/2020 0914   CREATININE 0.7 10/14/2017 1159      Component Value Date/Time   CALCIUM 10.0 05/23/2020 0914   CALCIUM 9.3 10/14/2017 1159   ALKPHOS 95 05/23/2020 0914   ALKPHOS 83 10/14/2017 1159   AST 19 05/23/2020 0914   AST 20 10/14/2017 1159   ALT 20 05/23/2020 0914   ALT 23 10/14/2017 1159   BILITOT 0.3 05/23/2020 0914   BILITOT 0.23 10/14/2017 1159       No results found for: LABCA2  No components found for: [id number]  No results for input(s): INR in the last 168 hours.  Urinalysis    Component Value Date/Time   COLORURINE YELLOW 02/23/2014 1004   APPEARANCEUR CLEAR 02/23/2014 1004   LABSPEC 1.025 02/23/2014 1004   PHURINE 5.5 02/23/2014 1004   GLUCOSEU NEGATIVE 02/23/2014 1004   HGBUR NEGATIVE 02/23/2014 1004   BILIRUBINUR NEGATIVE 02/23/2014 1004   KETONESUR NEGATIVE 02/23/2014 1004   PROTEINUR NEGATIVE 02/23/2014 1004   UROBILINOGEN 0.2 02/23/2014 1004   NITRITE NEGATIVE 02/23/2014 1004   LEUKOCYTESUR NEGATIVE 02/23/2014 1004     STUDIES: CT Abdomen Pelvis W Contrast  Result Date: 05/29/2020 CLINICAL DATA:  Diarrhea, nausea/vomiting and pain. EXAM: CT ABDOMEN AND PELVIS WITH CONTRAST TECHNIQUE: Multidetector CT imaging of the abdomen and pelvis was performed using the standard protocol following bolus administration of intravenous contrast. CONTRAST:  150m OMNIPAQUE IOHEXOL 300 MG/ML  SOLN COMPARISON:  06/26/2017 CT abdomen pelvis. 06/12/2017 right upper quadrant ultrasound. FINDINGS: Lower chest: Lung bases are clear. Hepatobiliary: No focal hepatic lesion. Hepatic steatosis. No biliary dilatation. Gallbladder is surgically absent. Pancreas: No focal lesions or pancreatic ductal dilatation. No surrounding inflammation. Spleen: Unremarkable. Adrenals/Urinary Tract: Adrenal glands are unremarkable. No focal renal lesion  or calculi. No hydronephrosis. Bladder is unremarkable. Stomach/Bowel: Stomach is within normal limits. Appendix is surgically absent. No evidence of obstruction. No bowel wall thickening or inflammatory changes. No ascites. Vascular/Lymphatic: Vasculature is within normal limits for patient's age. Atheromatous disease involving the abdominal aorta and its branch vessels. No abdominopelvic adenopathy. Reproductive: Hysterectomy. Other: Small fat containing umbilical hernia. Musculoskeletal: No acute or significant osseous findings. Mild spondylosis. IMPRESSION: No acute abdominopelvic process. No CT findings to explain patient's symptoms. Hepatic steatosis.  Aortic and iliac atheromatous disease. Sequela of cholecystectomy, appendectomy and hysterectomy. Electronically Signed   By: CPrimitivo GauzeM.D.   On: 05/29/2020 08:36   MM DIAG BREAST TOMO BILATERAL  Result Date: 05/21/2020 CLINICAL DATA:  History of left breast cancer status post lumpectomy in 2018. EXAM: DIGITAL DIAGNOSTIC BILATERAL MAMMOGRAM WITH TOMO AND CAD COMPARISON:  Previous exam(s). ACR Breast Density Category b: There are scattered areas of fibroglandular density. FINDINGS: Stable lumpectomy changes are seen in the left breast. No suspicious mass or malignant type microcalcifications identified in either breast. Mammographic images were processed with CAD. IMPRESSION: No evidence of malignancy in either breast. RECOMMENDATION: Bilateral diagnostic mammogram in 1 year is recommended. I have discussed the findings and recommendations with the patient. If applicable, a reminder letter will be sent to the patient regarding the next appointment. BI-RADS CATEGORY  2: Benign. Electronically Signed   By: DLillia MountainM.D.   On: 05/21/2020 10:45      ELIGIBLE FOR AVAILABLE RESEARCH PROTOCOL: no  ASSESSMENT: 54y.o. [place] status post left breast overlapping sites biopsy 03/02/2017 for a clinical T3 N1-2, stage IIIA invasive ductal  carcinoma, grade 3, estrogen and progesterone receptor negative, HER-2 amplified, with an MIB-1 of 35%  (1) genetics testing07/24/2018 through the Hereditary Gene Panel offered by [facility] found no deleterious mutations in APC, ATM, AXIN2, BARD1, BMPR1A, BRCA1, BRCA2, BRIP1, CDH1, CDKN2A (p14ARF), CDKN2A (p16INK4a), CHEK2, CTNNA1, DICER1, EPCAM (Deletion/duplication testing only), GREM1 (promoter region deletion/duplication testing only), KIT, MEN1, MLH1, MSH2, MSH3, MSH6, MUTYH, NBN, NF1, NHTL1, PALB2, PDGFRA, PMS2, POLD1, POLE, PTEN, RAD50, RAD51C, RAD51D, SDHB, SDHC, SDHD, SMAD4, SMARCA4. STK11, TP53, TSC1, TSC2, and VHL.  The following genes were evaluated for sequence changes only: SDHA and HOXB13 c.251G>A variant only.  (2) neoadjuvant chemotherapy consisting of carboplatin and docetaxel given with trastuzumab and pertuzumab every 21 days for 6 cycles, started 03/31/2017, completed 07/14/2017  (a) Pertuzumab stopped after cycle 1 secondary to side effects     (b) Pertuzumab resumed cycle 3, not at loading dose.  (3) trastuzumab continued to complete 6 months (final dose 09/22/2017)  (a) echocardiogram 03/20/2017 showed an ejection fraction of 55%  (b) echocardiogram 06/30/2017 shows an ejection fraction of 55%  (c) echocardiogram 09/30/2017 shows an ejection fraction of 55-60% range   (4) status post left lumpectomy and axillary lymph node dissection 08/19/2017 for a residual ypT1a ypN0 invasive ductal carcinoma, grade 2, with negative margins, repeat prognostic panel again estrogen and progesterone receptor negative, but HER-2 amplified  (5) adjuvant radiation 09/22/17 - 11/05/17 Site/dose:   Left breast treated to 50 Gy with 25 fx of 2 Gy  Left supraclavicular region treated to 45 Gy with 25 fx of 1.8 Gy                      Left breast boost of 10 Gy with 5 fx of 2 Gy   PLAN: Maria Hester is now 2-1/2 years out from definitive surgery for her breast cancer with no  evidence of disease recurrence.  This is very favorable.  She is very vexed with the diarrhea problem and so far does not have an answer.  She is being worked up by [facility] and I suspect she will need a flex sig pretty soon for definitive diagnosis and treatment  Otherwise she will see me again in 1 year.  She knows to call for any other issue that may develop before the next visit  Total encounter time 25 minutes.Sarajane Jews C. Levie Owensby, MD  05/30/20 9:43 AM Medical Oncology and Hematology [facility] [place], [place] [address] Tel. [phone]    Fax. [phone]   I, Wilburn Mylar, am acting as scribe for Dr. Virgie Hester. Anayelli Lai.  I, Lurline Del MD, have reviewed the above documentation for accuracy and completeness, and I agree with the above.   *Total Encounter Time as defined by the Centers for Medicare and Medicaid Services includes, in addition to the face-to-face time of a patient visit (documented in the note above) non-face-to-face time: obtaining and reviewing outside history, ordering and reviewing medications, tests or procedures, care coordination (communications with other health care professionals or caregivers) and documentation in the medical record.

## 2020-05-30 ENCOUNTER — Inpatient Hospital Stay: Payer: 59

## 2020-05-30 ENCOUNTER — Inpatient Hospital Stay: Payer: 59 | Attending: Oncology | Admitting: Oncology

## 2020-05-30 ENCOUNTER — Other Ambulatory Visit: Payer: Self-pay

## 2020-05-30 VITALS — BP 126/77 | HR 62 | Temp 97.6°F | Resp 18 | Ht 65.0 in | Wt 213.7 lb

## 2020-05-30 DIAGNOSIS — I2510 Atherosclerotic heart disease of native coronary artery without angina pectoris: Secondary | ICD-10-CM | POA: Diagnosis not present

## 2020-05-30 DIAGNOSIS — Z90710 Acquired absence of both cervix and uterus: Secondary | ICD-10-CM | POA: Insufficient documentation

## 2020-05-30 DIAGNOSIS — Z9049 Acquired absence of other specified parts of digestive tract: Secondary | ICD-10-CM | POA: Diagnosis not present

## 2020-05-30 DIAGNOSIS — R112 Nausea with vomiting, unspecified: Secondary | ICD-10-CM | POA: Insufficient documentation

## 2020-05-30 DIAGNOSIS — Z836 Family history of other diseases of the respiratory system: Secondary | ICD-10-CM | POA: Diagnosis not present

## 2020-05-30 DIAGNOSIS — C50812 Malignant neoplasm of overlapping sites of left female breast: Secondary | ICD-10-CM

## 2020-05-30 DIAGNOSIS — Z803 Family history of malignant neoplasm of breast: Secondary | ICD-10-CM | POA: Insufficient documentation

## 2020-05-30 DIAGNOSIS — K760 Fatty (change of) liver, not elsewhere classified: Secondary | ICD-10-CM | POA: Insufficient documentation

## 2020-05-30 DIAGNOSIS — Z8051 Family history of malignant neoplasm of kidney: Secondary | ICD-10-CM | POA: Diagnosis not present

## 2020-05-30 DIAGNOSIS — R109 Unspecified abdominal pain: Secondary | ICD-10-CM | POA: Insufficient documentation

## 2020-05-30 DIAGNOSIS — Z8349 Family history of other endocrine, nutritional and metabolic diseases: Secondary | ICD-10-CM | POA: Diagnosis not present

## 2020-05-30 DIAGNOSIS — Z171 Estrogen receptor negative status [ER-]: Secondary | ICD-10-CM | POA: Insufficient documentation

## 2020-05-30 DIAGNOSIS — Z79899 Other long term (current) drug therapy: Secondary | ICD-10-CM | POA: Insufficient documentation

## 2020-05-30 DIAGNOSIS — Z923 Personal history of irradiation: Secondary | ICD-10-CM | POA: Insufficient documentation

## 2020-05-30 DIAGNOSIS — Z9079 Acquired absence of other genital organ(s): Secondary | ICD-10-CM | POA: Insufficient documentation

## 2020-05-30 DIAGNOSIS — Z833 Family history of diabetes mellitus: Secondary | ICD-10-CM | POA: Insufficient documentation

## 2020-05-30 DIAGNOSIS — R197 Diarrhea, unspecified: Secondary | ICD-10-CM | POA: Diagnosis not present

## 2020-05-30 DIAGNOSIS — Z8249 Family history of ischemic heart disease and other diseases of the circulatory system: Secondary | ICD-10-CM | POA: Insufficient documentation

## 2020-05-30 DIAGNOSIS — Z800 Family history of malignant neoplasm of digestive organs: Secondary | ICD-10-CM | POA: Diagnosis not present

## 2020-05-30 DIAGNOSIS — K429 Umbilical hernia without obstruction or gangrene: Secondary | ICD-10-CM | POA: Diagnosis not present

## 2020-05-30 DIAGNOSIS — Z87891 Personal history of nicotine dependence: Secondary | ICD-10-CM | POA: Insufficient documentation

## 2020-05-30 DIAGNOSIS — Z9221 Personal history of antineoplastic chemotherapy: Secondary | ICD-10-CM | POA: Insufficient documentation

## 2020-05-30 DIAGNOSIS — Z801 Family history of malignant neoplasm of trachea, bronchus and lung: Secondary | ICD-10-CM | POA: Diagnosis not present

## 2020-05-30 DIAGNOSIS — Z90711 Acquired absence of uterus with remaining cervical stump: Secondary | ICD-10-CM

## 2020-05-30 DIAGNOSIS — Z808 Family history of malignant neoplasm of other organs or systems: Secondary | ICD-10-CM | POA: Diagnosis not present

## 2020-05-30 LAB — GI PROFILE, STOOL, PCR

## 2020-05-30 LAB — LIPID PANEL
Cholesterol: 314 mg/dL — ABNORMAL HIGH (ref 0–200)
HDL: 46 mg/dL (ref >40)
LDL Cholesterol: 229 mg/dL — ABNORMAL HIGH (ref 0–99)
Total CHOL/HDL Ratio: 6.8 RATIO
Triglycerides: 196 mg/dL — ABNORMAL HIGH (ref <150)
VLDL: 39 mg/dL (ref 0–40)

## 2020-06-01 ENCOUNTER — Telehealth: Payer: Self-pay | Admitting: Oncology

## 2020-06-01 NOTE — Telephone Encounter (Signed)
Scheduled appts per 8/18 los. Left voicemail with appt date and time.

## 2020-06-02 LAB — FECAL LACTOFERRIN, QUANT
Fecal Lactoferrin: POSITIVE — AB
MICRO NUMBER:: 10830891
SPECIMEN QUALITY:: ADEQUATE

## 2020-06-02 LAB — OVA AND PARASITE EXAMINATION
CONCENTRATE RESULT:: NONE SEEN
MICRO NUMBER:: 10830780
SPECIMEN QUALITY:: ADEQUATE
TRICHROME RESULT:: NONE SEEN

## 2020-06-02 LAB — PANCREATIC ELASTASE, FECAL: Pancreatic Elastase-1, Stool: 72 mcg/g — ABNORMAL LOW

## 2020-06-04 NOTE — Progress Notes (Signed)
Addendum: Reviewed and agree with assessment and management plan. Landyn Lorincz M, MD  

## 2020-06-05 ENCOUNTER — Other Ambulatory Visit: Payer: Self-pay

## 2020-06-05 ENCOUNTER — Encounter: Payer: Self-pay | Admitting: Oncology

## 2020-06-05 ENCOUNTER — Telehealth: Payer: Self-pay | Admitting: Physician Assistant

## 2020-06-05 DIAGNOSIS — K8689 Other specified diseases of pancreas: Secondary | ICD-10-CM

## 2020-06-05 MED ORDER — PANCRELIPASE (LIP-PROT-AMYL) 36000-114000 UNITS PO CPEP: ORAL_CAPSULE | ORAL | 11 refills | Status: DC

## 2020-06-05 NOTE — Telephone Encounter (Signed)
Pt is requesting a call back from a nurse to discuss the CT scan results and to see what follow up care plan Maria Hester has for her

## 2020-06-05 NOTE — Telephone Encounter (Signed)
Spoke with patient regarding CT scan results and stool study results - see result notes for more information

## 2020-07-11 ENCOUNTER — Ambulatory Visit: Payer: 59 | Admitting: Physician Assistant

## 2020-08-09 ENCOUNTER — Ambulatory Visit (INDEPENDENT_AMBULATORY_CARE_PROVIDER_SITE_OTHER): Payer: 59 | Admitting: Physician Assistant

## 2020-08-09 ENCOUNTER — Encounter: Payer: Self-pay | Admitting: Physician Assistant

## 2020-08-09 VITALS — BP 130/72 | HR 64 | Ht 65.0 in | Wt 216.0 lb

## 2020-08-09 DIAGNOSIS — R140 Abdominal distension (gaseous): Secondary | ICD-10-CM

## 2020-08-09 DIAGNOSIS — K8689 Other specified diseases of pancreas: Secondary | ICD-10-CM

## 2020-08-09 DIAGNOSIS — R197 Diarrhea, unspecified: Secondary | ICD-10-CM

## 2020-08-09 NOTE — Progress Notes (Signed)
Chief Complaint: Follow-up diarrhea, abdominal pain and nausea  HPI:    Maria Hester is a 54 year old Caucasian female with a past medical history as listed below including breast cancer and cholecystectomy, known to Dr. Hilarie Fredrickson, who returns to clinic today for follow-up of her diarrhea, abdominal pain and nausea.    06/24/2018 colonoscopy for screening with 1 5 mm polyp in the transverse colon otherwise normal. Pathology showed tubular adenoma repeat was recommended in 5 years.\    98/11/21 patient seen in clinic and described that all of her symptoms seem to start after vacation in May when she ate a beef burger which seemed off. She described anytime she eats she would bloat and have diarrhea, it also started with some nausea. Also did not feel like her Nexium 40 mg twice a day was as effective as before. Having 7-8 bowel movements a day with no blood. At that time ordered stool studies to include GI pathogen panel, O&P, lactoferrin and pancreatic elastase. Also did labs to include a CBC, CMP, lipase, CRP and ESR. Ordered a CT down pelvis with contrast for further eval.    05/28/2020 fecal lactoferrin was positive and pancreatic elastase in the stool was low at 72 showing pancreatic insufficiency. Blood work returned with an elevated ESR minimally at 31.    05/29/2020 CT the abdomen pelvis with contrast showed no acute abdominal pelvic process, hepatic steatosis and sequela of cholecystectomy, appendectomy and hysterectomy.     06/05/2020 patient started on Creon 2 with a meal and 1 with a snack.    Today, the patient explains that she took the Creon for 2 months straight and this did help decrease her bowel movements going from 12 times a day down to 2 times per day and "it is a real solid stool".  Explains that she also stopped seeing food that was undigested in her bowel movements, did continue with a small amount of mucus with stools and did continue with some bloating.  She noticed that she possibly  had an increase in headaches after a month of taking this medicine and had some neck pressure so she stopped taking the Creon over the past week and tells me that the neck pressure seems somewhat better but she continues with headaches.  Without the Creon she is now seeing food again in her stool.    Denies fever, chills, weight loss or symptoms that awaken her from sleep.  Past Medical History:  Diagnosis Date  . Breast cancer ([place]) 2018   Last chemo 0ct 2018, last radiation Jan. 2019  . 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 Cataracts, bilateral    bil cataracts removed  . Complication of anesthesia    blood pressure very low after surgery difficult to wake -1st surgery only-when pt was 54 y.o.  . Family history of breast cancer   . Frozen shoulder    left  . Gallbladder disease    "sludge in gallbladder",   . Glaucoma   . Hiatal hernia   . History of hiatal hernia   . Hx of thyroid nodule    benign  . Hyperlipidemia   . Personal history of chemotherapy   . Personal history of radiation therapy   . Thyroid disease    Thyroid nodules - bx was negative  . Uterine fibroid     Past Surgical History:  Procedure Laterality Date  . ABDOMINAL HYSTERECTOMY    .  ABDOMINOPLASTY N/A 02/28/2014   Procedure: ABDOMINOPLASTY with Panniculectomy;  Surgeon: [name], MD;  Location: [facility];  Service: Gynecology;  Laterality: N/A;  . APPENDECTOMY    . BILATERAL SALPINGECTOMY Bilateral 02/28/2014   Procedure: BILATERAL SALPINGECTOMY;  Surgeon: [name], MD;  Location: [facility];  Service: Gynecology;  Laterality: Bilateral;  . BREAST BIOPSY Bilateral 08/16/2018  . BREAST LUMPECTOMY Left 2018  . BREAST LUMPECTOMY WITH RADIOACTIVE SEED AND SENTINEL LYMPH NODE BIOPSY Left 08/19/2017   Procedure: LEFT BREAST LUMPECTOMY WITH RADIOACTIVE SEED AND LEFT SENTINEL LYMPH NODE BIOPSY;  Surgeon: Excell Seltzer, MD;  Location: [facility];   Service: General;  Laterality: Left;  . CATARACT EXTRACTION, BILATERAL    . CHOLECYSTECTOMY N/A 08/19/2017   Procedure: LAPAROSCOPIC CHOLECYSTECTOMY WITH INTRAOPERATIVE CHOLANGIOGRAM;  Surgeon: Excell Seltzer, MD;  Location: [facility];  Service: General;  Laterality: N/A;  . PORTACATH PLACEMENT Right 03/18/2017   Procedure: INSERTION PORT-A-CATH;  Surgeon: Excell Seltzer, MD;  Location: [facility];  Service: General;  Laterality: Right;  . removal portacath  08/2017  . reverse tubal ligation    . SCAR REVISION N/A 02/28/2014   Procedure: SCAR REVISION;  Surgeon: [name], MD;  Location: [facility];  Service: Gynecology;  Laterality: N/A;  . SUPRACERVICAL ABDOMINAL HYSTERECTOMY N/A 02/28/2014   Procedure: HYSTERECTOMY SUPRACERVICAL ABDOMINAL;  Surgeon: [name], MD;  Location: [facility];  Service: Gynecology;  Laterality: N/A;  . TUBAL LIGATION    . WISDOM TOOTH EXTRACTION      Current Outpatient Medications  Medication Sig Dispense Refill  . Coenzyme Q10 (COQ10 PO) Take 300 mg by mouth daily. Takes (2) 300 mg capsules daily    . esomeprazole (NEXIUM) 40 MG capsule TAKE 1 CAPSULE BY MOUTH TWICE A DAY BEFORE A MEAL 180 capsule 0  . latanoprost (XALATAN) 0.005 % ophthalmic solution Place 1 drop into both eyes at bedtime.    . lipase/protease/amylase (CREON) 36000 UNITS CPEP capsule Take 2 capsules (72,000 Units total) by mouth 3 (three) times daily with meals. May also take 1 capsule (36,000 Units total) as needed (with snacks). 240 capsule 11   No current facility-administered medications for this visit.   Facility-Administered Medications Ordered in Other Visits  Medication Dose Route Frequency Provider Last Rate Last Admin  . heparin lock flush 100 unit/mL  500 Units Intracatheter Once Magrinat, Virgie Dad, MD      . sodium chloride flush (NS) 0.9 % injection 10 mL  10 mL Intracatheter Once Magrinat, Virgie Dad, MD        Allergies as of 08/09/2020  . (No Known Allergies)    Family  History  Problem Relation Age of Onset  . Heart disease Mother   . Hypertension Mother   . Diabetes Father   . Heart disease Father   . Heart disease Maternal Grandmother   . Colon cancer Maternal Grandmother 75       died in her 66's  . Heart disease Maternal Grandfather   . Esophageal cancer Maternal Grandfather 50       died at 51  . Diabetes Paternal Grandmother   . Heart disease Paternal Grandmother   . Kidney cancer Paternal Grandmother 72       died in her 2's  . Tuberculosis Paternal Grandfather   . Heart disease Paternal Grandfather   . Skin cancer Paternal Grandfather 34       died at 30, patient not sure if it was melanoma, BCC, Squamous, etc.  . Breast  cancer Maternal Aunt 50       died at 56  . Breast cancer Cousin 38       she is now in her 59's  . Rectal cancer Neg Hx   . Stomach cancer Neg Hx     Social History   Socioeconomic History  . Marital status: Married    Spouse name: Not on file  . Number of children: Not on file  . Years of education: Not on file  . Highest education level: Not on file  Occupational History  . Not on file  Tobacco Use  . Smoking status: Former Smoker    Packs/day: 0.50    Years: 34.00    Pack years: 17.00    Types: Cigarettes    Quit date: 10/13/2013    Years since quitting: 6.8  . Smokeless tobacco: Never Used  Vaping Use  . Vaping Use: Never used  Substance and Sexual Activity  . Alcohol use: No  . Drug use: No  . Sexual activity: Not Currently    Birth control/protection: Surgical    Comment: hyst  Other Topics Concern  . Not on file  Social History Narrative  . Not on file   Social Determinants of Health   Financial Resource Strain:   . Difficulty of Paying Living Expenses: Not on file  Food Insecurity:   . Worried About Charity fundraiser in the Last Year: Not on file  . Ran Out of Food in the Last Year: Not on file  Transportation Needs:   . Lack of Transportation (Medical): Not on file  . Lack of  Transportation (Non-Medical): Not on file  Physical Activity:   . Days of Exercise per Week: Not on file  . Minutes of Exercise per Session: Not on file  Stress:   . Feeling of Stress : Not on file  Social Connections:   . Frequency of Communication with Friends and Family: Not on file  . Frequency of Social Gatherings with Friends and Family: Not on file  . Attends Religious Services: Not on file  . Active Member of Clubs or Organizations: Not on file  . Attends Archivist Meetings: Not on file  . Marital Status: Not on file  Intimate Partner Violence:   . Fear of Current or Ex-Partner: Not on file  . Emotionally Abused: Not on file  . Physically Abused: Not on file  . Sexually Abused: Not on file    Review of Systems:    Constitutional: No weight loss, fever or chills Cardiovascular: No chest pain   Respiratory: No SOB Gastrointestinal: See HPI and otherwise negative   Physical Exam:  Vital signs: BP 130/72   Pulse 64   Ht 5' 5"  (1.651 m)   Wt 216 lb (98 kg)   LMP 02/11/2014 Comment: [place]  BMI 35.94 kg/m   Constitutional:   Pleasant Caucasian female appears to be in NAD, Well developed, Well nourished, alert and cooperative  Respiratory: Respirations even and unlabored. Lungs clear to auscultation bilaterally.   No wheezes, crackles, or rhonchi.  Cardiovascular: Normal S1, S2. No MRG. Regular rate and rhythm. No peripheral edema, cyanosis or pallor.  Gastrointestinal:  Soft, mild distension, nontender. No rebound or guarding. Normal bowel sounds. No appreciable masses or hepatomegaly. Rectal:  Not performed.  Psychiatric: Demonstrates good judgement and reason without abnormal affect or behaviors.  RELEVANT LABS AND IMAGING: CBC    Component Value Date/Time   WBC 7.3 05/23/2020 0914   RBC  4.46 05/23/2020 0914   HGB 12.9 05/23/2020 0914   HGB 11.6 10/14/2017 1159   HCT 38.3 05/23/2020 0914   HCT 36.2 10/14/2017 1159   PLT 321.0 05/23/2020 0914   PLT 275  10/14/2017 1159   MCV 86.0 05/23/2020 0914   MCV 90.7 10/14/2017 1159   MCH 28.9 10/31/2019 1008   MCHC 33.6 05/23/2020 0914   RDW 14.1 05/23/2020 0914   RDW 13.5 10/14/2017 1159   LYMPHSABS 2.4 05/23/2020 0914   LYMPHSABS 1.9 10/14/2017 1159   MONOABS 0.6 05/23/2020 0914   MONOABS 0.5 10/14/2017 1159   EOSABS 0.1 05/23/2020 0914   EOSABS 0.1 10/14/2017 1159   BASOSABS 0.0 05/23/2020 0914   BASOSABS 0.0 10/14/2017 1159    CMP     Component Value Date/Time   NA 142 05/23/2020 0914   NA 139 10/14/2017 1159   K 4.7 05/23/2020 0914   K 4.3 10/14/2017 1159   CL 104 05/23/2020 0914   CO2 26 05/23/2020 0914   CO2 25 10/14/2017 1159   GLUCOSE 91 05/23/2020 0914   GLUCOSE 92 10/14/2017 1159   BUN 15 05/23/2020 0914   BUN 15.9 10/14/2017 1159   CREATININE 0.73 05/23/2020 0914   CREATININE 0.7 10/14/2017 1159   CALCIUM 10.0 05/23/2020 0914   CALCIUM 9.3 10/14/2017 1159   PROT 7.2 05/23/2020 0914   PROT 6.8 10/14/2017 1159   ALBUMIN 4.3 05/23/2020 0914   ALBUMIN 3.6 10/14/2017 1159   AST 19 05/23/2020 0914   AST 20 10/14/2017 1159   ALT 20 05/23/2020 0914   ALT 23 10/14/2017 1159   ALKPHOS 95 05/23/2020 0914   ALKPHOS 83 10/14/2017 1159   BILITOT 0.3 05/23/2020 0914   BILITOT 0.23 10/14/2017 1159   GFRNONAA >60 10/31/2019 1008   GFRAA >60 10/31/2019 1008    Assessment: 1.  Pancreatic insufficiency: Decreased fecal  pancreatic elastase, patient symptoms are better with Creon 2.  Bloating: Continued even with the Creon, though may be slightly decreased ; consider relation of pancreatic insufficiency +/-SIBO versus other  3.  Diarrhea: Better with Creon  Plan: 1.  For the most part symptoms are better when patient is taking Creon 36,000 lipase units 2 with a meal and 1 with a snack.  Recommend that she restart this medication as I do not feel it is related to her "neck pressure" and headaches.  She should take this consistently for the next 3 months and see how she is  feeling. 2.  If patient continues with bloating, may need to consider treating for SIBO? 3.  Patient asked questions in regards to diabetes and this finding.  Would recommend that she follow with her PCP in regards to further testing for this including a hemoglobin A1c.  She can also discuss her headaches and neck pressure. 4.  We will set patient up for a follow-up with Dr. Hilarie Fredrickson as this is a new problem for her and she has not seen him lately.  Scheduled appoint with him in 3 months.  Ellouise Newer, PA-C [facility] 08/09/2020, 9:04 AM  Cc: [place], Jenetta Downer,*

## 2020-08-09 NOTE — Patient Instructions (Signed)
Continue Creon  Follow up with Dr Hilarie Fredrickson in 3 months  If you are age 52 or older, your body mass index should be between 23-30. Your Body mass index is 35.94 kg/m. If this is out of the aforementioned range listed, please consider follow up with your Primary Care Provider.  If you are age 44 or younger, your body mass index should be between 19-25. Your Body mass index is 35.94 kg/m. If this is out of the aformentioned range listed, please consider follow up with your Primary Care Provider.    Thank you for choosing [facility]  Lovett Calender

## 2020-08-16 ENCOUNTER — Telehealth: Payer: Self-pay

## 2020-08-16 NOTE — Telephone Encounter (Signed)
-----   Message from Levin Erp, [place] sent at 08/16/2020 11:06 AM EDT ----- Regarding: See Dr. Garth Schlatter recs Needs at home SIBO breath test for bloating.  Thanks-JLL ----- Message ----- From: Jerene Bears, MD Sent: 08/16/2020   9:50 AM EDT To: Levin Erp, PA    ----- Message ----- From: Levin Erp, [place] Sent: 08/09/2020   9:30 AM EDT To: Jerene Bears, MD

## 2020-08-16 NOTE — Telephone Encounter (Signed)
Left message on machine to call back  

## 2020-08-16 NOTE — Progress Notes (Signed)
Addendum: Reviewed and agree with assessment and management plan. I would recommend we proceed with lactulose breath testing to exclude SIBO given persistent bloating despite using pancreatic enzymes to treat EPI This can be ordered and sent to her and she can complete the test at home, results will come back to [place] Thank you Dawna Jakes, Lajuan Lines, MD

## 2020-08-20 ENCOUNTER — Other Ambulatory Visit: Payer: Self-pay | Admitting: Physician Assistant

## 2020-08-20 DIAGNOSIS — K8689 Other specified diseases of pancreas: Secondary | ICD-10-CM

## 2020-08-20 DIAGNOSIS — R140 Abdominal distension (gaseous): Secondary | ICD-10-CM | POA: Insufficient documentation

## 2020-08-20 NOTE — Telephone Encounter (Signed)
I spoke with the pt and she has agreed to do SIBO testing. I will put the kit at the front desk for her to pick up at her convenience.

## 2020-08-27 ENCOUNTER — Telehealth: Payer: Self-pay | Admitting: Internal Medicine

## 2020-08-27 NOTE — Telephone Encounter (Signed)
The pt was left a detailed message that I mailed her the label last week and to call back if the company has not contacted her by the end of the week.

## 2020-12-07 ENCOUNTER — Other Ambulatory Visit: Payer: Self-pay | Admitting: Physician Assistant

## 2020-12-07 ENCOUNTER — Telehealth: Payer: Self-pay

## 2020-12-07 DIAGNOSIS — K6389 Other specified diseases of intestine: Secondary | ICD-10-CM

## 2020-12-07 MED ORDER — RIFAXIMIN 550 MG PO TABS: 550.0000 mg | ORAL_TABLET | Freq: Three times a day (TID) | ORAL | 0 refills | Status: AC

## 2020-12-07 NOTE — Telephone Encounter (Signed)
Spoke with patient in regards to results and next steps. Patient is aware that prescription is being sent to [facility] to get best coverage through her insurance, advised that they will give her a call to set up shipment once approved. Advised patient to let me know if she does not hear from them within a week or so. Records faxed to [facility] at [phone]. Patient has been scheduled for a follow up with Ellouise Newer PA on Tuesday, 01/08/21 at 9:30 AM. Patient verbalized understanding of all information and had no concerns at the end of the call  Prescription for Rifaximin sent to [facility].

## 2020-12-07 NOTE — Progress Notes (Signed)
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  Recommend patient start rifaximin 550 mg 3 times daily x2 weeks.  Ellouise Newer, PA-C

## 2020-12-07 NOTE — Telephone Encounter (Signed)
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  Please prescribe Rifaximin 550 mg 3 times daily x2 weeks for the patient.  Thanks, J LL.

## 2020-12-13 NOTE — Telephone Encounter (Signed)
PA Case: [id number], Status: Approved, Coverage Starts on: 12/13/2020 12:00:00 AM, Coverage Ends on: 12/13/2021 12:00:00 AM.  Cover My Meds KEY: [id number]

## 2021-01-08 ENCOUNTER — Ambulatory Visit (INDEPENDENT_AMBULATORY_CARE_PROVIDER_SITE_OTHER): Payer: 59 | Admitting: Physician Assistant

## 2021-01-08 ENCOUNTER — Other Ambulatory Visit: Payer: Self-pay

## 2021-01-08 ENCOUNTER — Encounter: Payer: Self-pay | Admitting: Physician Assistant

## 2021-01-08 VITALS — BP 120/80 | HR 95 | Ht 65.5 in | Wt 217.4 lb

## 2021-01-08 DIAGNOSIS — K6389 Other specified diseases of intestine: Secondary | ICD-10-CM | POA: Diagnosis not present

## 2021-01-08 DIAGNOSIS — R140 Abdominal distension (gaseous): Secondary | ICD-10-CM

## 2021-01-08 NOTE — Progress Notes (Signed)
Chief Complaint: Follow-up SIBO  HPI:    Maria Hester is a 55 year old Caucasian female with a past medical history as listed below, known to Dr. Hilarie Fredrickson, who presents clinic today for follow-up of SIBO.    06/24/2018 colonoscopy for screening with 1 5 mm polyp in the transverse colon otherwise normal. Pathology showed tubular adenoma repeat was recommended in 5 years.\    98/11/21 patient seen in clinic and described that all of her symptoms seem to start after vacation in May when she ate a beef burger which seemed off. She described anytime she eats she would bloat and have diarrhea, it also started with some nausea. Also did not feel like her Nexium 40 mg twice a day was as effective as before. Having 7-8 bowel movements a day with no blood. At that time ordered stool studies to include GI pathogen panel, O&P, lactoferrin and pancreatic elastase. Also did labs to include a CBC, CMP, lipase, CRP and ESR. Ordered a CT down pelvis with contrast for further eval.    05/28/2020 fecal lactoferrin was positive and pancreatic elastase in the stool was low at 72 showing pancreatic insufficiency. Blood work returned with an elevated ESR minimally at 31.    05/29/2020 CT the abdomen pelvis with contrast showed no acute abdominal pelvic process, hepatic steatosis and sequela of cholecystectomy, appendectomy and hysterectomy.     06/05/2020 patient started on Creon 2 with a meal and 1 with a snack.    08/09/2020 patient seen in clinic describes she took Creon for 2 months and it did help decrease her bowel movements going from 12 times a day down to 2 times per day per day also stopped seeing undigested food in her stool.  She did have an increase in headaches after 1 month and so she stopped taking it.  She described that she was continuing with headaches but did not have "neck pressure".  Recommend that she can take it consistently for the next 3 months and see how she is feeling.    12/07/2020 patient had a  positive SIBO breath test and was prescribed Rifaximin 550 mg 3 times daily x2 weeks.    Today, the patient tells me that she feels 90% better.  She has been off of the Rifaximin for 2 weeks.  Tells me that just now she is starting to get a little bit of bloating occasionally.  She did stop the Creon when she started the Rifaximin due to the fact that it was still giving her some neck pressure and headaches.  She has maintained solid stools at least once a day.  Overall she is very happy with how much better she feels.    Denies fever, chills or weight loss.  Past Medical History:  Diagnosis Date  . Breast cancer ([place]) 2018   Last chemo 0ct 2018, last radiation Jan. 2019  . 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 Cataracts, bilateral    bil cataracts removed  . Complication of anesthesia    blood pressure very low after surgery difficult to wake -1st surgery only-when pt was 55 y.o.  . Family history of breast cancer   . Frozen shoulder    left  . Gallbladder disease    "sludge in gallbladder",   . Glaucoma   . Hiatal hernia   . History of hiatal hernia   . Hx of thyroid nodule    benign  . Hyperlipidemia   .  Personal history of chemotherapy   . Personal history of radiation therapy   . Thyroid disease    Thyroid nodules - bx was negative  . Uterine fibroid     Past Surgical History:  Procedure Laterality Date  . ABDOMINAL HYSTERECTOMY    . ABDOMINOPLASTY N/A 02/28/2014   Procedure: ABDOMINOPLASTY with Panniculectomy;  Surgeon: [name], MD;  Location: [facility];  Service: Gynecology;  Laterality: N/A;  . APPENDECTOMY    . BILATERAL SALPINGECTOMY Bilateral 02/28/2014   Procedure: BILATERAL SALPINGECTOMY;  Surgeon: [name], MD;  Location: [facility];  Service: Gynecology;  Laterality: Bilateral;  . BREAST BIOPSY Bilateral 08/16/2018  . BREAST LUMPECTOMY Left 2018  . BREAST LUMPECTOMY WITH RADIOACTIVE SEED AND  SENTINEL LYMPH NODE BIOPSY Left 08/19/2017   Procedure: LEFT BREAST LUMPECTOMY WITH RADIOACTIVE SEED AND LEFT SENTINEL LYMPH NODE BIOPSY;  Surgeon: Excell Seltzer, MD;  Location: [facility];  Service: General;  Laterality: Left;  . CATARACT EXTRACTION, BILATERAL    . CHOLECYSTECTOMY N/A 08/19/2017   Procedure: LAPAROSCOPIC CHOLECYSTECTOMY WITH INTRAOPERATIVE CHOLANGIOGRAM;  Surgeon: Excell Seltzer, MD;  Location: [facility];  Service: General;  Laterality: N/A;  . PORTACATH PLACEMENT Right 03/18/2017   Procedure: INSERTION PORT-A-CATH;  Surgeon: Excell Seltzer, MD;  Location: [facility];  Service: General;  Laterality: Right;  . removal portacath  08/2017  . reverse tubal ligation    . SCAR REVISION N/A 02/28/2014   Procedure: SCAR REVISION;  Surgeon: [name], MD;  Location: [facility];  Service: Gynecology;  Laterality: N/A;  . SUPRACERVICAL ABDOMINAL HYSTERECTOMY N/A 02/28/2014   Procedure: HYSTERECTOMY SUPRACERVICAL ABDOMINAL;  Surgeon: [name], MD;  Location: [facility];  Service: Gynecology;  Laterality: N/A;  . TUBAL LIGATION    . WISDOM TOOTH EXTRACTION      Current Outpatient Medications  Medication Sig Dispense Refill  . CREON 36000-114000 units CPEP capsule TAKE 2 CAPSULES BY MOUTH 3 (THREE) TIMES DAILY WITH MEALS. MAY ALSO TAKE 1 CAPSULEAS NEEDED (WITH SNACKS). 2160 capsule 1  . esomeprazole (NEXIUM) 40 MG capsule TAKE 1 CAPSULE BY MOUTH TWICE A DAY BEFORE A MEAL 180 capsule 0  . latanoprost (XALATAN) 0.005 % ophthalmic solution Place 1 drop into both eyes at bedtime.     No current facility-administered medications for this visit.   Facility-Administered Medications Ordered in Other Visits  Medication Dose Route Frequency Provider Last Rate Last Admin  . heparin lock flush 100 unit/mL  500 Units Intracatheter Once Magrinat, Virgie Dad, MD      . sodium chloride flush (NS) 0.9 % injection 10 mL  10 mL Intracatheter Once Magrinat, Virgie Dad, MD        Allergies as of 01/08/2021   . (No Known Allergies)    Family History  Problem Relation Age of Onset  . Heart disease Mother   . Hypertension Mother   . Diabetes Father   . Heart disease Father   . Heart disease Maternal Grandmother   . Colon cancer Maternal Grandmother 71       died in her 3's  . Heart disease Maternal Grandfather   . Esophageal cancer Maternal Grandfather 50       died at 49  . Diabetes Paternal Grandmother   . Heart disease Paternal Grandmother   . Kidney cancer Paternal Grandmother 50       died in her 79's  . Tuberculosis Paternal Grandfather   . Heart disease Paternal Grandfather   . Skin cancer Paternal Grandfather 38  died at 19, patient not sure if it was melanoma, BCC, Squamous, etc.  . Breast cancer Maternal Aunt 50       died at 74  . Breast cancer Cousin 53       she is now in her 28's  . Rectal cancer Neg Hx   . Stomach cancer Neg Hx     Social History   Socioeconomic History  . Marital status: Married    Spouse name: Not on file  . Number of children: Not on file  . Years of education: Not on file  . Highest education level: Not on file  Occupational History  . Not on file  Tobacco Use  . Smoking status: Former Smoker    Packs/day: 0.50    Years: 34.00    Pack years: 17.00    Types: Cigarettes    Quit date: 10/13/2013    Years since quitting: 7.2  . Smokeless tobacco: Never Used  Vaping Use  . Vaping Use: Never used  Substance and Sexual Activity  . Alcohol use: No  . Drug use: No  . Sexual activity: Not Currently    Birth control/protection: Surgical    Comment: hyst  Other Topics Concern  . Not on file  Social History Narrative  . Not on file   Social Determinants of Health   Financial Resource Strain: Not on file  Food Insecurity: Not on file  Transportation Needs: Not on file  Physical Activity: Not on file  Stress: Not on file  Social Connections: Not on file  Intimate Partner Violence: Not on file    Review of Systems:     Constitutional: No weight loss, fever or chills Cardiovascular: No chest pain Respiratory: No SOB  Gastrointestinal: See HPI and otherwise negative   Physical Exam:  Vital signs: BP 120/80   Pulse 95   Ht 5' 5.5" (1.664 m)   Wt 217 lb 6 oz (98.6 kg)   LMP 02/11/2014 Comment: [place]  BMI 35.62 kg/m   Constitutional:   Pleasant overweight Caucasian female appears to be in NAD, Well developed, Well nourished, alert and cooperative Respiratory: Respirations even and unlabored. Lungs clear to auscultation bilaterally.   No wheezes, crackles, or rhonchi.  Cardiovascular: Normal S1, S2. No MRG. Regular rate and rhythm. No peripheral edema, cyanosis or pallor.  Gastrointestinal:  Soft, nondistended, nontender. No rebound or guarding. Normal bowel sounds. No appreciable masses or hepatomegaly. Rectal:  Not performed.  Psychiatric:  Demonstrates good judgement and reason without abnormal affect or behaviors.  See HPI for recent labs.  Assessment: 1.  SIBO: Patient treated with 2 weeks of Rifaximin and feeling 90% better 2.  Bloating: With above  Plan: 1.  Discussed the patient that it is very encouraging that she is feeling so much better.  At this point I think we should just let her rest for a while and see how things are going to pan out.  If symptoms do increase again in the next month or 2 then we can consider retreating with Rifaximin.  Patient will call and let us know. 2.  At this point stools are solid, she can hold her Creon.  Especially since it seems to have some side effects for her. 3.  Patient to follow in clinic with [place] as needed in the near future.  Maria Newer, PA-C [facility] 01/08/2021, 9:14 AM  Cc: [place], Jenetta Downer,*

## 2021-01-08 NOTE — Patient Instructions (Signed)
If you are age 53 or older, your body mass index should be between 23-30. Your Body mass index is 35.62 kg/m. If this is out of the aforementioned range listed, please consider follow up with your Primary Care Provider.  If you are age 25 or younger, your body mass index should be between 19-25. Your Body mass index is 35.62 kg/m. If this is out of the aformentioned range listed, please consider follow up with your Primary Care Provider.   Follow up as needed.  Thank you for choosing me and [facility].  Ellouise Newer, PA-C

## 2021-01-10 ENCOUNTER — Other Ambulatory Visit: Payer: Self-pay | Admitting: Internal Medicine

## 2021-01-10 ENCOUNTER — Encounter: Payer: Self-pay | Admitting: *Deleted

## 2021-01-10 NOTE — Telephone Encounter (Signed)
I have left a message for patient indicating that we did send refill for Nexium to the pharmacy. Insurance would not authorize. We attempted prior authorization but unfortunately are told that Nexium is not on formulary and prior authorization cannot be attempted. I have asked that she contact her insurance company to find out which PPI's they prefer this year and call us back to let us know so we can choose between one of those medications.

## 2021-01-10 NOTE — Telephone Encounter (Signed)
Shawndrea [place]: [id number] help? Call us at [phone] Outcome Additional Information Required This request cannot be processed due to the medication is not covered by the plan. Drug NEXIUM 40 MG dr capsules Investment banker, operational PA Form [phone] NCPDP)

## 2021-01-29 NOTE — Progress Notes (Signed)
Addendum: Reviewed and agree with assessment and management plan. Shernita Rabinovich M, MD  

## 2021-01-30 ENCOUNTER — Other Ambulatory Visit: Payer: Self-pay

## 2021-02-27 ENCOUNTER — Telehealth: Payer: Self-pay | Admitting: Physician Assistant

## 2021-02-27 MED ORDER — PANTOPRAZOLE SODIUM 40 MG PO TBEC: 40.0000 mg | DELAYED_RELEASE_TABLET | Freq: Two times a day (BID) | ORAL | 0 refills | Status: DC

## 2021-02-27 NOTE — Telephone Encounter (Signed)
Inbound call from patient stating Nexium is no longer covered under her insurance and is requesting alternate medication be sent to pharmacy please.

## 2021-02-27 NOTE — Telephone Encounter (Signed)
Left Voice mail for patient to call me back

## 2021-02-27 NOTE — Telephone Encounter (Signed)
Can substitute with pantoprazole 40 mg, or omeprazole 40 mg, or lansoprazole 30 mg once daily If ineffective she should let us know

## 2021-04-03 ENCOUNTER — Other Ambulatory Visit: Payer: Self-pay | Admitting: Oncology

## 2021-04-03 DIAGNOSIS — Z1231 Encounter for screening mammogram for malignant neoplasm of breast: Secondary | ICD-10-CM

## 2021-04-03 MED ORDER — RIFAXIMIN 550 MG PO TABS: 550.0000 mg | ORAL_TABLET | Freq: Three times a day (TID) | ORAL | 0 refills | Status: AC

## 2021-04-03 NOTE — Telephone Encounter (Signed)
Refill sent to [facility], pt notified via my chart.

## 2021-04-12 ENCOUNTER — Encounter: Payer: Self-pay | Admitting: Oncology

## 2021-05-21 ENCOUNTER — Other Ambulatory Visit: Payer: Self-pay | Admitting: Internal Medicine

## 2021-05-27 ENCOUNTER — Ambulatory Visit
Admission: RE | Admit: 2021-05-27 | Discharge: 2021-05-27 | Disposition: A | Discharge status: Home / Self Care | Payer: 59 | Source: Ambulatory Visit | Attending: Oncology | Admitting: Oncology

## 2021-05-27 ENCOUNTER — Other Ambulatory Visit: Payer: Self-pay

## 2021-05-27 DIAGNOSIS — Z1231 Encounter for screening mammogram for malignant neoplasm of breast: Secondary | ICD-10-CM

## 2021-05-31 ENCOUNTER — Other Ambulatory Visit: Payer: Self-pay | Admitting: *Deleted

## 2021-05-31 DIAGNOSIS — Z171 Estrogen receptor negative status [ER-]: Secondary | ICD-10-CM

## 2021-06-02 NOTE — Progress Notes (Signed)
Maria Hester  [phone] [phone]    ID: CHASMINE LENDER DOB: 1966-05-03  MR#: [id number]  VXB#:[id number]  Patient Care Team: Yvone Neu, MD as PCP - General (Family Medicine) Excell Seltzer, MD (Inactive) as Consulting Physician (General Surgery) Dominic Mahaney, Virgie Dad, MD as Consulting Physician (Oncology) Eppie Gibson, MD as Attending Physician (Radiation Oncology) Tommie Sams, MD as Referring Physician (Internal Medicine) Levin Erp, PA as Physician Assistant (Gastroenterology) OTHER MD:   CHIEF COMPLAINT: HER-2 positive estrogen receptor negative breast cancer  CURRENT TREATMENT: observation   INTERVAL HISTORY: [name] returns today for follow-up of her estrogen receptor negative, HER2 positive breast cancer. The patient continues under observation.   Since her last visit, she underwent bilateral screening mammography with tomography at [facility] on 05/27/2021 showing: breast density category B; no evidence of malignancy in either breast.     REVIEW OF SYSTEMS: Shina went on "ultimate health" diet and has lost quite a bit of weight.  They have not started her yet on an exercise program because they want the weight to stabilize first.  Because of the weight loss her breast sizes are now asymmetric and she would like to consider some oncoplastic for that.  A detailed review of systems today was otherwise stable   COVID 19 VACCINATION STATUS: Moderna x2, no booster as of August 2022.   BREAST CANCER HISTORY: From the original intake note:  Maria Hester herself noted a change in her left breast when looking into a mirror and then palpating a mass in the upper-outer quadrant. She brought this to medical attention and on 02/24/2017 underwent bilateral diagnostic mammography with tomography and left breast ultrasonography at the [facility]. The breast density was category C. In the upper portion of the left  breast there was a broad area of distortion measuring up to 7.5 cm. On exam there was a broad firm area involving the upper outer quadrant of the left breast which was visibly protruding. By ultrasound at the 11:00 axis 8 cm from the nipple there was an irregular hypoechoic mass measuring 1.4 cm with additiona ases at 2:00, 3 cm from the nipple and multiple other masses as described, all in the upper-outer quadrant primarily. Ultrasound of the left axilla showed at least 3 prominent lymph nodes one of which had a cortical bulge.  On 03/02/2017 the patient underwent biopsy of a left breast mass at the 11:30 o'clock position and a second mass described as upper outer quadrant as well as one of the suspicious left axillary lymph nodes. These all showed invasive ductal carcinoma, grade 3. Prognostic panel from one of the 2 masses showed the tumor to be estrogen and progesterone receptor negative, but HER-2 amplified, with a signals ratio of 8.24 and the number per cell 15.65.  Her subsequent history is as detailed below   PAST MEDICAL HISTORY: Past Medical History:  Diagnosis Date   Breast cancer ([place]) 2018   Last chemo 0ct 2018, last radiation Jan. 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   Erosive esophagitis    Family history of breast cancer    Frozen shoulder    left   Gallbladder disease    "sludge in gallbladder",  Glaucoma    Hiatal hernia    History of hiatal hernia    Hx of thyroid nodule    benign   Hyperlipidemia    Personal history of chemotherapy    Personal history of radiation therapy    Thyroid disease    Thyroid nodules - bx was negative   Uterine fibroid     PAST SURGICAL HISTORY: Past Surgical History:  Procedure Laterality Date    ABDOMINAL HYSTERECTOMY     ABDOMINOPLASTY N/A 02/28/2014   Procedure: ABDOMINOPLASTY with Panniculectomy;  Surgeon: [name], MD;  [facility];  Service: Gynecology;  Laterality: N/A;   APPENDECTOMY     BILATERAL SALPINGECTOMY Bilateral 02/28/2014   Procedure: BILATERAL SALPINGECTOMY;  Surgeon: [name], MD;  [facility];  Service: Gynecology;  Laterality: Bilateral;   BREAST BIOPSY Bilateral 08/16/2018   BREAST LUMPECTOMY Left 2018   BREAST LUMPECTOMY WITH RADIOACTIVE SEED AND SENTINEL LYMPH NODE BIOPSY Left 08/19/2017   Procedure: LEFT BREAST LUMPECTOMY WITH RADIOACTIVE SEED AND LEFT SENTINEL LYMPH NODE BIOPSY;  Surgeon: Excell Seltzer, MD;  Location: [place];  Service: General;  Laterality: Left;   CATARACT EXTRACTION, BILATERAL     CHOLECYSTECTOMY N/A 08/19/2017   Procedure: LAPAROSCOPIC CHOLECYSTECTOMY WITH INTRAOPERATIVE CHOLANGIOGRAM;  Surgeon: Excell Seltzer, MD;  Location: [place];  Service: General;  Laterality: N/A;   PORTACATH PLACEMENT Right 03/18/2017   Procedure: INSERTION PORT-A-CATH;  Surgeon: Excell Seltzer, MD;  [facility];  Service: General;  Laterality: Right;   removal portacath  08/2017   reverse tubal ligation     SCAR REVISION N/A 02/28/2014   Procedure: SCAR REVISION;  Surgeon: [name], MD;  [facility];  Service: Gynecology;  Laterality: N/A;   SUPRACERVICAL ABDOMINAL HYSTERECTOMY N/A 02/28/2014   Procedure: HYSTERECTOMY SUPRACERVICAL ABDOMINAL;  Surgeon: [name], MD;  [facility];  Service: Gynecology;  Laterality: N/A;   TUBAL LIGATION     WISDOM TOOTH EXTRACTION      FAMILY HISTORY Family History  Problem Relation Age of Onset   Heart disease Mother    Hypertension Mother    Diabetes Father    Heart disease Father    Heart disease Maternal Grandmother    Colon cancer Maternal Grandmother 75       died in her 23's   Heart disease Maternal Grandfather    Esophageal cancer Maternal Grandfather 58        died at 16   Diabetes Paternal Grandmother    Heart disease Paternal Grandmother    Kidney cancer Paternal Grandmother 76       died in her 87's   Tuberculosis Paternal Grandfather    Heart disease Paternal Grandfather    Skin cancer Paternal Grandfather 63       died at 42, patient not sure if it was melanoma, BCC, Squamous, etc.   Breast cancer Maternal Aunt 27       died at 68   Breast cancer Cousin 64       she is now in her 14's   Rectal cancer Neg Hx    Stomach cancer Neg Hx   The patient's father died from heart disease at age 59. The patient's mother is living at age 72. The patient has one brother, 2 sisters. On the mother's side and aunt had breast cancer at age 33 and her daughter had breast cancer in her early 66s. There is also a grandfather with esophageal cancer and a grandmother with colon  cancer. On the paternal side there is a grandmother with kidney cancer at an early age and a grandfather with melanoma at an early age.   GYNECOLOGIC HISTORY:  Patient's last menstrual period was 02/11/2014.  Menarche age 24, first live birth age 35, the patient is [place] P2. She underwent hysterectomy without salpingo-oophorectomy May 2016. She did not use hormone replacement. She did use oral contraceptives approximately 10 years in the 1980s.   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  She has a second job for [employer] so basically she is currently working from 8:30 in the morning to 10 PM in the evening.  Her husband Darnelle Maffucci is Surveyor, mining in the same [employer], which has a 10% aviation enrollment. Daughter Lorenda Hatchet is a hairstylist in [place] and son Chip Boer is a Physiological scientist in [place]. The patient has 2 grandsons and one granddaughter. She attends a [employer]   ADVANCED DIRECTIVES: The patient has completed advanced directives and will have them notarized   HEALTH MAINTENANCE: Social History    Tobacco Use   Smoking status: Former    Packs/day: 0.50    Years: 34.00    Pack years: 17.00    Types: Cigarettes    Quit date: 10/13/2013    Years since quitting: 7.6   Smokeless tobacco: Never  Vaping Use   Vaping Use: Never used  Substance Use Topics   Alcohol use: No   Drug use: No    Colonoscopy:No  PAP: 03/04/2017  Bone density: No   No Known Allergies  Current Outpatient Medications  Medication Sig Dispense Refill   atorvastatin (LIPITOR) 20 MG tablet Take 1 tablet (20 mg total) by mouth daily.     latanoprost (XALATAN) 0.005 % ophthalmic solution Place 1 drop into both eyes at bedtime.     pantoprazole (PROTONIX) 40 MG tablet Take 1 tablet (40 mg total) by mouth 2 (two) times daily before a meal. 180 tablet 1   No current facility-administered medications for this visit.   Facility-Administered Medications Ordered in Other Visits  Medication Dose Route Frequency Provider Last Rate Last Admin   heparin lock flush 100 unit/mL  500 Units Intracatheter Once Cheral Cappucci, Virgie Dad, MD       sodium chloride flush (NS) 0.9 % injection 10 mL  10 mL Intracatheter Once Starr Engel, Virgie Dad, MD        OBJECTIVE: White woman in no acute distress  Vitals:   06/03/21 0936  BP: 118/63  Pulse: (!) 55  Resp: 18  Temp: 97.9 F (36.6 C)  SpO2: 99%     Body mass index is 34.68 kg/m.     ECOG FS:1 - Symptomatic but completely ambulatory  No physical exam today because of COVID restrictions.  LAB RESULTS:  CMP     Component Value Date/Time   NA 143 06/03/2021 0911   NA 139 10/14/2017 1159   K 4.9 06/03/2021 0911   K 4.3 10/14/2017 1159   CL 106 06/03/2021 0911   CO2 27 06/03/2021 0911   CO2 25 10/14/2017 1159   GLUCOSE 93 06/03/2021 0911   GLUCOSE 92 10/14/2017 1159   BUN 23 (H) 06/03/2021 0911   BUN 15.9 10/14/2017 1159   CREATININE 0.81 06/03/2021 0911   CREATININE 0.7 10/14/2017 1159   CALCIUM 9.8 06/03/2021 0911   CALCIUM 9.3 10/14/2017 1159   PROT 7.3  06/03/2021 0911   PROT 6.8 10/14/2017 1159   ALBUMIN 4.0  06/03/2021 0911   ALBUMIN 3.6 10/14/2017 1159   AST 16 06/03/2021 0911   AST 20 10/14/2017 1159   ALT 14 06/03/2021 0911   ALT 23 10/14/2017 1159   ALKPHOS 95 06/03/2021 0911   ALKPHOS 83 10/14/2017 1159   BILITOT 0.3 06/03/2021 0911   BILITOT 0.23 10/14/2017 1159   GFRNONAA >60 06/03/2021 0911   GFRAA >60 10/31/2019 1008    No results found for: Ronnald Ramp, A1GS, A2GS, BETS, BETA2SER, GAMS, MSPIKE, SPEI  No results found for: Nils Pyle, [facility]  Lab Results  Component Value Date   WBC 7.6 06/03/2021   NEUTROABS 4.7 06/03/2021   HGB 12.8 06/03/2021   HCT 40.4 06/03/2021   MCV 88.2 06/03/2021   PLT 287 06/03/2021      Chemistry      Component Value Date/Time   NA 143 06/03/2021 0911   NA 139 10/14/2017 1159   K 4.9 06/03/2021 0911   K 4.3 10/14/2017 1159   CL 106 06/03/2021 0911   CO2 27 06/03/2021 0911   CO2 25 10/14/2017 1159   BUN 23 (H) 06/03/2021 0911   BUN 15.9 10/14/2017 1159   CREATININE 0.81 06/03/2021 0911   CREATININE 0.7 10/14/2017 1159      Component Value Date/Time   CALCIUM 9.8 06/03/2021 0911   CALCIUM 9.3 10/14/2017 1159   ALKPHOS 95 06/03/2021 0911   ALKPHOS 83 10/14/2017 1159   AST 16 06/03/2021 0911   AST 20 10/14/2017 1159   ALT 14 06/03/2021 0911   ALT 23 10/14/2017 1159   BILITOT 0.3 06/03/2021 0911   BILITOT 0.23 10/14/2017 1159       No results found for: LABCA2  No components found for: [id number]  No results for input(s): INR in the last 168 hours.  Urinalysis    Component Value Date/Time   COLORURINE YELLOW 02/23/2014 1004   APPEARANCEUR CLEAR 02/23/2014 1004   LABSPEC 1.025 02/23/2014 1004   PHURINE 5.5 02/23/2014 1004   GLUCOSEU NEGATIVE 02/23/2014 1004   HGBUR NEGATIVE 02/23/2014 1004   BILIRUBINUR NEGATIVE 02/23/2014 1004   KETONESUR NEGATIVE 02/23/2014 1004   PROTEINUR NEGATIVE 02/23/2014 1004   UROBILINOGEN 0.2  02/23/2014 1004   NITRITE NEGATIVE 02/23/2014 1004   LEUKOCYTESUR NEGATIVE 02/23/2014 1004     STUDIES: MM 3D SCREEN BREAST BILATERAL  Result Date: 05/28/2021 CLINICAL DATA:  Screening. EXAM: DIGITAL SCREENING BILATERAL MAMMOGRAM WITH TOMOSYNTHESIS AND CAD TECHNIQUE: Bilateral screening digital craniocaudal and mediolateral oblique mammograms were obtained. Bilateral screening digital breast tomosynthesis was performed. The images were evaluated with computer-aided detection. COMPARISON:  Previous exam(s). ACR Breast Density Category b: There are scattered areas of fibroglandular density. FINDINGS: There are no findings suspicious for malignancy. IMPRESSION: No mammographic evidence of malignancy. A result letter of this screening mammogram will be mailed directly to the patient. RECOMMENDATION: Screening mammogram in one year. (Code:SM-B-01Y) BI-RADS CATEGORY  1: Negative. Electronically Signed   By: Audie Pinto M.D.   On: 05/28/2021 16:07       ELIGIBLE FOR AVAILABLE RESEARCH PROTOCOL: no  ASSESSMENT: 55 y.o. [place], [place] woman status post left breast overlapping sites biopsy 03/02/2017 for a clinical T3 N1-2, stage IIIA invasive ductal carcinoma, grade 3, estrogen and progesterone receptor negative, HER-2 amplified, with an MIB-1 of 35%  (1) genetics testing07/24/2018 through the Hereditary Gene Panel offered by [facility] found no deleterious mutations in APC, ATM, AXIN2, BARD1, BMPR1A, BRCA1, BRCA2, BRIP1, CDH1, CDKN2A (p14ARF), CDKN2A (p16INK4a), CHEK2, CTNNA1, DICER1, EPCAM (Deletion/duplication testing only), GREM1 (promoter region  deletion/duplication testing only), KIT, MEN1, MLH1, MSH2, MSH3, MSH6, MUTYH, NBN, NF1, NHTL1, PALB2, PDGFRA, PMS2, POLD1, POLE, PTEN, RAD50, RAD51C, RAD51D, SDHB, SDHC, SDHD, SMAD4, SMARCA4. STK11, TP53, TSC1, TSC2, and VHL.  The following genes were evaluated for sequence changes only: SDHA and HOXB13 c.251G>A variant only.  (2) neoadjuvant chemotherapy  consisting of carboplatin and docetaxel given with trastuzumab and pertuzumab every 21 days for 6 cycles, started 03/31/2017, completed 07/14/2017  (a) Pertuzumab stopped after cycle 1 secondary to side effects     (b) Pertuzumab resumed cycle 3, not at loading dose.  (3) trastuzumab continued to complete 6 months (final dose 09/22/2017)  (a) echocardiogram 03/20/2017 showed an ejection fraction of 55%  (b) echocardiogram 06/30/2017 shows an ejection fraction of 55%  (c) echocardiogram 09/30/2017 shows an ejection fraction of 55-60% range   (4) status post left lumpectomy and axillary lymph node dissection 08/19/2017 for a residual ypT1a ypN0 invasive ductal carcinoma, grade 2, with negative margins, repeat prognostic panel again estrogen and progesterone receptor negative, but HER-2 amplified  (5) adjuvant radiation 09/22/17 - 11/05/17  Site/dose:   Left breast treated to 50 Gy with 25 fx of 2 Gy                       Left supraclavicular region treated to 45 Gy with 25 fx of 1.8 Gy                      Left breast boost of 10 Gy with 5 fx of 2 Gy    PLAN: Jannelle is now just about 4 years out from definitive surgery with no evidence of disease recurrence.  This is very favorable.  I think her concern regarding breast asymmetry is very reasonable.  I am referring her to plastics to initiate discussion.  They may want her weight to stabilize before doing any definitive surgery and she is aware of the.  Whenever she is ready to start an exercise program I have suggested 30 to 45 minutes a day at least 5 days a week.  Which exercise that she does is actually less important than the frequency and time   .  She will return to see [place] in 1 year.  That will be her "graduation visit".  Total encounter time 20 minutes.Sarajane Jews C. Saidy Ormand, MD  06/03/21 10:35 AM Medical Oncology and Hematology [facility] [place],  [address] Tel. [phone]    Fax.  [phone]   I, Wilburn Mylar, am acting as scribe for Dr. Virgie Dad. Murdis Flitton.  I, Lurline Del MD, have reviewed the above documentation for accuracy and completeness, and I agree with the above.   *Total Encounter Time as defined by the Centers for Medicare and Medicaid Services includes, in addition to the face-to-face time of a patient visit (documented in the note above) non-face-to-face time: obtaining and reviewing outside history, ordering and reviewing medications, tests or procedures, care coordination (communications with other health care professionals or caregivers) and documentation in the medical record.

## 2021-06-03 ENCOUNTER — Inpatient Hospital Stay: Payer: 59

## 2021-06-03 ENCOUNTER — Other Ambulatory Visit: Payer: Self-pay

## 2021-06-03 ENCOUNTER — Inpatient Hospital Stay: Payer: 59 | Attending: Oncology | Admitting: Oncology

## 2021-06-03 VITALS — BP 118/63 | HR 55 | Temp 97.9°F | Resp 18 | Ht 65.0 in | Wt 208.4 lb

## 2021-06-03 DIAGNOSIS — Z803 Family history of malignant neoplasm of breast: Secondary | ICD-10-CM | POA: Insufficient documentation

## 2021-06-03 DIAGNOSIS — Z9221 Personal history of antineoplastic chemotherapy: Secondary | ICD-10-CM | POA: Insufficient documentation

## 2021-06-03 DIAGNOSIS — Z8249 Family history of ischemic heart disease and other diseases of the circulatory system: Secondary | ICD-10-CM | POA: Diagnosis not present

## 2021-06-03 DIAGNOSIS — Z833 Family history of diabetes mellitus: Secondary | ICD-10-CM | POA: Insufficient documentation

## 2021-06-03 DIAGNOSIS — Z836 Family history of other diseases of the respiratory system: Secondary | ICD-10-CM | POA: Insufficient documentation

## 2021-06-03 DIAGNOSIS — Z9049 Acquired absence of other specified parts of digestive tract: Secondary | ICD-10-CM | POA: Insufficient documentation

## 2021-06-03 DIAGNOSIS — Z87891 Personal history of nicotine dependence: Secondary | ICD-10-CM | POA: Insufficient documentation

## 2021-06-03 DIAGNOSIS — C50812 Malignant neoplasm of overlapping sites of left female breast: Secondary | ICD-10-CM | POA: Diagnosis not present

## 2021-06-03 DIAGNOSIS — Z8051 Family history of malignant neoplasm of kidney: Secondary | ICD-10-CM | POA: Insufficient documentation

## 2021-06-03 DIAGNOSIS — Z808 Family history of malignant neoplasm of other organs or systems: Secondary | ICD-10-CM | POA: Insufficient documentation

## 2021-06-03 DIAGNOSIS — C50412 Malignant neoplasm of upper-outer quadrant of left female breast: Secondary | ICD-10-CM | POA: Insufficient documentation

## 2021-06-03 DIAGNOSIS — Z800 Family history of malignant neoplasm of digestive organs: Secondary | ICD-10-CM | POA: Insufficient documentation

## 2021-06-03 DIAGNOSIS — Z79899 Other long term (current) drug therapy: Secondary | ICD-10-CM | POA: Diagnosis not present

## 2021-06-03 DIAGNOSIS — Z923 Personal history of irradiation: Secondary | ICD-10-CM | POA: Insufficient documentation

## 2021-06-03 DIAGNOSIS — Z9079 Acquired absence of other genital organ(s): Secondary | ICD-10-CM | POA: Diagnosis not present

## 2021-06-03 DIAGNOSIS — Z171 Estrogen receptor negative status [ER-]: Secondary | ICD-10-CM | POA: Diagnosis not present

## 2021-06-03 LAB — CMP (CANCER CENTER ONLY)
ALT: 14 U/L (ref 0–44)
AST: 16 U/L (ref 15–41)
Albumin: 4 g/dL (ref 3.5–5.0)
Alkaline Phosphatase: 95 U/L (ref 38–126)
Anion gap: 10 (ref 5–15)
BUN: 23 mg/dL — ABNORMAL HIGH (ref 6–20)
CO2: 27 mmol/L (ref 22–32)
Calcium: 9.8 mg/dL (ref 8.9–10.3)
Chloride: 106 mmol/L (ref 98–111)
Creatinine: 0.81 mg/dL (ref 0.44–1.00)
GFR, Estimated: >60 mL/min (ref >60)
Glucose, Bld: 93 mg/dL (ref 70–99)
Potassium: 4.9 mmol/L (ref 3.5–5.1)
Sodium: 143 mmol/L (ref 135–145)
Total Bilirubin: 0.3 mg/dL (ref 0.3–1.2)
Total Protein: 7.3 g/dL (ref 6.5–8.1)

## 2021-06-03 LAB — CBC WITH DIFFERENTIAL (CANCER CENTER ONLY)
Abs Immature Granulocytes: 0.02 10*3/uL (ref 0.00–0.07)
Basophils Absolute: 0 10*3/uL (ref 0.0–0.1)
Basophils Relative: 0 %
Eosinophils Absolute: 0.1 10*3/uL (ref 0.0–0.5)
Eosinophils Relative: 1 %
HCT: 40.4 % (ref 36.0–46.0)
Hemoglobin: 12.8 g/dL (ref 12.0–15.0)
Immature Granulocytes: 0 %
Lymphocytes Relative: 29 %
Lymphs Abs: 2.2 10*3/uL (ref 0.7–4.0)
MCH: 27.9 pg (ref 26.0–34.0)
MCHC: 31.7 g/dL (ref 30.0–36.0)
MCV: 88.2 fL (ref 80.0–100.0)
Monocytes Absolute: 0.6 10*3/uL (ref 0.1–1.0)
Monocytes Relative: 8 %
Neutro Abs: 4.7 10*3/uL (ref 1.7–7.7)
Neutrophils Relative %: 62 %
Platelet Count: 287 10*3/uL (ref 150–400)
RBC: 4.58 MIL/uL (ref 3.87–5.11)
RDW: 13.9 % (ref 11.5–15.5)
WBC Count: 7.6 10*3/uL (ref 4.0–10.5)
nRBC: 0 % (ref 0.0–0.2)

## 2021-06-03 NOTE — Addendum Note (Signed)
Addended by: Chauncey Cruel on: 06/03/2021 10:40 AM   Modules accepted: Orders

## 2021-06-26 ENCOUNTER — Ambulatory Visit (INDEPENDENT_AMBULATORY_CARE_PROVIDER_SITE_OTHER): Payer: 59 | Admitting: Plastic Surgery

## 2021-06-26 ENCOUNTER — Encounter: Payer: Self-pay | Admitting: Plastic Surgery

## 2021-06-26 ENCOUNTER — Other Ambulatory Visit: Payer: Self-pay

## 2021-06-26 VITALS — BP 107/69 | HR 46 | Ht 65.0 in | Wt 182.2 lb

## 2021-06-26 DIAGNOSIS — N62 Hypertrophy of breast: Secondary | ICD-10-CM | POA: Diagnosis not present

## 2021-06-26 DIAGNOSIS — C50812 Malignant neoplasm of overlapping sites of left female breast: Secondary | ICD-10-CM

## 2021-06-26 DIAGNOSIS — Z171 Estrogen receptor negative status [ER-]: Secondary | ICD-10-CM | POA: Diagnosis not present

## 2021-06-26 NOTE — Progress Notes (Signed)
Referring Provider Hungarland, Jenetta Downer, MD [facility] [place] [address]   CC:  Chief Complaint  Patient presents with   Advice Only      Maria Hester is an 55 y.o. female.  HPI: Patient presents to discuss breast asymmetry after breast conservation treatment for left breast cancer.  Her cancer was found about 4 years ago when she underwent neoadjuvant chemotherapy.  This was followed by lumpectomy and radiation.  She also seems to have had a lymph node dissection in the left axilla.  She is about 4 years out and is currently disease-free by all indications.  She has been losing weight lately and that combined with the radiation changes to her left breast have caused an increasing asymmetry.  She would like to be smaller on both sides but particularly on the right side to get it to match the left.  She also has some skin dimpling in the area superior to the nipple areolar complex on the left side as a result of the lumpectomy that bothers her.  No Known Allergies  Outpatient Encounter Medications as of 06/26/2021  Medication Sig   atorvastatin (LIPITOR) 20 MG tablet Take 1 tablet (20 mg total) by mouth daily.   latanoprost (XALATAN) 0.005 % ophthalmic solution Place 1 drop into both eyes at bedtime.   pantoprazole (PROTONIX) 40 MG tablet Take 1 tablet (40 mg total) by mouth 2 (two) times daily before a meal.   Facility-Administered Encounter Medications as of 06/26/2021  Medication   heparin lock flush 100 unit/mL   sodium chloride flush (NS) 0.9 % injection 10 mL     Past Medical History:  Diagnosis Date   Breast cancer ([place]) 2018   Last chemo 0ct 2018, last radiation Jan. 2019   Breast disorder    invasive ductal carcinoma, DCIS and metastatic CA left axillary lymph node   Cataracts, bilateral    bil cataracts removed   Complication of anesthesia    blood pressure very low after surgery difficult to wake -1st surgery  only-when pt was 55 y.o.   Erosive esophagitis    Family history of breast cancer    Frozen shoulder    left   Gallbladder disease    "sludge in gallbladder",    Glaucoma    Hiatal hernia    History of hiatal hernia    Hx of thyroid nodule    benign   Hyperlipidemia    Personal history of chemotherapy    Personal history of radiation therapy    Thyroid disease    Thyroid nodules - bx was negative   Uterine fibroid     Past Surgical History:  Procedure Laterality Date   ABDOMINAL HYSTERECTOMY     ABDOMINOPLASTY N/A 02/28/2014   Procedure: ABDOMINOPLASTY with Panniculectomy;  Surgeon: [name], MD;  Location[facility];  Service: Gynecology;  Laterality: N/A;   APPENDECTOMY     BILATERAL SALPINGECTOMY Bilateral 02/28/2014   Procedure: BILATERAL SALPINGECTOMY;  Surgeon: [name], MD;  [facility];  Service: Gynecology;  Laterality: Bilateral;   BREAST BIOPSY Bilateral 08/16/2018   BREAST LUMPECTOMY Left 2018   BREAST LUMPECTOMY WITH RADIOACTIVE SEED AND SENTINEL LYMPH NODE BIOPSY Left 08/19/2017   Procedure: LEFT BREAST LUMPECTOMY WITH RADIOACTIVE SEED AND LEFT SENTINEL LYMPH NODE BIOPSY;  Surgeon: Excell Seltzer, MD;  Location: [place];  Service: General;  Laterality: Left;   CATARACT EXTRACTION, BILATERAL     CHOLECYSTECTOMY  N/A 08/19/2017   Procedure: LAPAROSCOPIC CHOLECYSTECTOMY WITH INTRAOPERATIVE CHOLANGIOGRAM;  Surgeon: Excell Seltzer, MD;  Location: [place];  Service: General;  Laterality: N/A;   PORTACATH PLACEMENT Right 03/18/2017   Procedure: INSERTION PORT-A-CATH;  Surgeon: Excell Seltzer, MD;  [facility];  Service: General;  Laterality: Right;   removal portacath  08/2017   reverse tubal ligation     SCAR REVISION N/A 02/28/2014   Procedure: SCAR REVISION;  Surgeon: [name], MD;  [facility];  Service: Gynecology;  Laterality: N/A;   SUPRACERVICAL ABDOMINAL HYSTERECTOMY N/A 02/28/2014   Procedure: HYSTERECTOMY SUPRACERVICAL  ABDOMINAL;  Surgeon: [name], MD;  [facility];  Service: Gynecology;  Laterality: N/A;   TUBAL LIGATION     WISDOM TOOTH EXTRACTION      Family History  Problem Relation Age of Onset   Heart disease Mother    Hypertension Mother    Diabetes Father    Heart disease Father    Heart disease Maternal Grandmother    Colon cancer Maternal Grandmother 58       died in her 53's   Heart disease Maternal Grandfather    Esophageal cancer Maternal Grandfather 41       died at 77   Diabetes Paternal Grandmother    Heart disease Paternal Grandmother    Kidney cancer Paternal Grandmother 53       died in her 77's   Tuberculosis Paternal Grandfather    Heart disease Paternal Grandfather    Skin cancer Paternal Grandfather 19       died at 14, patient not sure if it was melanoma, BCC, Squamous, etc.   Breast cancer Maternal Aunt 86       died at 73   Breast cancer Cousin 13       she is now in her 65's   Rectal cancer Neg Hx    Stomach cancer Neg Hx     Social History   Social History Narrative   Not on file  Denies tobacco use  Review of Systems General: Denies fevers, chills, weight loss CV: Denies chest pain, shortness of breath, palpitations  Physical Exam Vitals with BMI 06/26/2021 06/03/2021 01/08/2021  Height '5\' 5"'$  '5\' 5"'$  5' 5.5"  Weight 182 lbs 3 oz 208 lbs 6 oz 217 lbs 6 oz  BMI 30.32 [id number]  Systolic [id number] [id number]  Diastolic 69 63 80  Pulse 46 55 95    General:  No acute distress,  Alert and oriented, Non-Toxic, Normal speech and affect Breast: She has grade 2 ptosis on the right and minimal ptosis on the left.  Nipple areolar complexes around 3 cm lower on the right side than it is on the left side.  She has a superior periareolar scar on the left from her lumpectomy and some skin irregularities and dimpling superior to that.  Her radiation changes on the left are minimal to moderate in terms of the skin thickening.  No obvious scars on the right side and  no masses on the right side that are obvious to me.  Assessment/Plan Patient presents with breast asymmetry after breast conservation therapy.  Given that she wants to be smaller on the left side the way to accomplish that would be a small reduction combined with a larger reduction on the right.  I did explain that she has a slightly higher risk of wound healing complications on the left side given her history of radiation.  I could also do  fat grafting at that time to fill out the upper pole as she does want to have a bit more projection.  At the moment she is continuing to lose weight at about 2 to 3 pounds per week so we will plan on waiting until her weight stabilizes prior to proceeding with surgery.  We will set a tentative time to reevaluate her after the first of the year and then see how things are coming with her weight and whether or not we can more clearly define a surgical timeline.  She is happy with this plan and I think it should address her concerns.  All of her questions were answered.  Cindra Presume 06/26/2021, 5:28 PM

## 2021-10-29 ENCOUNTER — Ambulatory Visit: Payer: 59 | Admitting: Surgical

## 2022-03-13 ENCOUNTER — Ambulatory Visit (INDEPENDENT_AMBULATORY_CARE_PROVIDER_SITE_OTHER): Payer: 59 | Admitting: Obstetrics & Gynecology

## 2022-03-13 ENCOUNTER — Encounter: Payer: Self-pay | Admitting: Obstetrics & Gynecology

## 2022-03-13 ENCOUNTER — Other Ambulatory Visit (HOSPITAL_COMMUNITY)
Admission: RE | Admit: 2022-03-13 | Discharge: 2022-03-13 | Disposition: A | Discharge status: Home / Self Care | Payer: 59 | Source: Ambulatory Visit | Attending: Obstetrics & Gynecology | Admitting: Obstetrics & Gynecology

## 2022-03-13 VITALS — BP 118/63 | HR 43 | Ht 65.5 in | Wt 168.0 lb

## 2022-03-13 DIAGNOSIS — Z01419 Encounter for gynecological examination (general) (routine) without abnormal findings: Secondary | ICD-10-CM | POA: Insufficient documentation

## 2022-03-13 DIAGNOSIS — G5781 Other specified mononeuropathies of right lower limb: Secondary | ICD-10-CM | POA: Diagnosis not present

## 2022-03-13 DIAGNOSIS — K5901 Slow transit constipation: Secondary | ICD-10-CM

## 2022-03-13 MED ORDER — POLYETHYLENE GLYCOL 3350 17 GM/SCOOP PO POWD: ORAL | 11 refills | Status: DC

## 2022-03-13 NOTE — Progress Notes (Signed)
Subjective:     Maria Hester is a 56 y.o. female here for a routine exam.  Patient's last menstrual period was 02/11/2014. [id number] Birth Control Method:  hysterectomy Menstrual Calendar(currently): na  Current complaints: constipation, RLQ pain.   Current acute medical issues:     Recent Gynecologic History Patient's last menstrual period was 02/11/2014. Last Pap: unsure, na,   Last mammogram: 8/22,  normal  Past Medical History:  Diagnosis Date   Breast cancer ([place]) 2018   Last chemo 0ct 2018, last radiation Jan. 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   Erosive esophagitis    Family history of breast cancer    Frozen shoulder    left   Gallbladder disease    "sludge in gallbladder",    Glaucoma    Hiatal hernia    History of hiatal hernia    Hx of thyroid nodule    benign   Hyperlipidemia    Personal history of chemotherapy    Personal history of radiation therapy    Thyroid disease    Thyroid nodules - bx was negative   Uterine fibroid     Past Surgical History:  Procedure Laterality Date   ABDOMINAL HYSTERECTOMY     ABDOMINOPLASTY N/A 02/28/2014   Procedure: ABDOMINOPLASTY with Panniculectomy;  Surgeon: [name], MD;  Location: [facility];  Service: Gynecology;  Laterality: N/A;   APPENDECTOMY     BILATERAL SALPINGECTOMY Bilateral 02/28/2014   Procedure: BILATERAL SALPINGECTOMY;  Surgeon: [name], MD;  Location: [facility];  Service: Gynecology;  Laterality: Bilateral;   BREAST BIOPSY Bilateral 08/16/2018   BREAST LUMPECTOMY Left 2018   BREAST LUMPECTOMY WITH RADIOACTIVE SEED AND SENTINEL LYMPH NODE BIOPSY Left 08/19/2017   Procedure: LEFT BREAST LUMPECTOMY WITH RADIOACTIVE SEED AND LEFT SENTINEL LYMPH NODE BIOPSY;   Surgeon: Excell Seltzer, MD;  Location: [facility];  Service: General;  Laterality: Left;   CATARACT EXTRACTION, BILATERAL     CHOLECYSTECTOMY N/A 08/19/2017   Procedure: LAPAROSCOPIC CHOLECYSTECTOMY WITH INTRAOPERATIVE CHOLANGIOGRAM;  Surgeon: Excell Seltzer, MD;  Location: [facility];  Service: General;  Laterality: N/A;   PORTACATH PLACEMENT Right 03/18/2017   Procedure: INSERTION PORT-A-CATH;  Surgeon: Excell Seltzer, MD;  Location: [facility];  Service: General;  Laterality: Right;   removal portacath  08/2017   reverse tubal ligation     SCAR REVISION N/A 02/28/2014   Procedure: SCAR REVISION;  Surgeon: [name], MD;  Location: [facility];  Service: Gynecology;  Laterality: N/A;   SUPRACERVICAL ABDOMINAL HYSTERECTOMY N/A 02/28/2014   Procedure: HYSTERECTOMY SUPRACERVICAL ABDOMINAL;  Surgeon: [name], MD;  Location: [facility];  Service: Gynecology;  Laterality: N/A;   TUBAL LIGATION     WISDOM TOOTH EXTRACTION      OB History     Gravida  3   Para  2   Term  2   Preterm      AB  1   Living  2      SAB  1   IAB      Ectopic      Multiple      Live Births  2           Social History   Socioeconomic  History   Marital status: Married    Spouse name: Not on file   Number of children: Not on file   Years of education: Not on file   Highest education level: Not on file  Occupational History   Not on file  Tobacco Use   Smoking status: Former    Packs/day: 0.50    Years: 34.00    Total pack years: 17.00    Types: Cigarettes    Quit date: 10/13/2013    Years since quitting: 8.6   Smokeless tobacco: Never  Vaping Use   Vaping Use: Never used  Substance and Sexual Activity   Alcohol use: No   Drug use: No   Sexual activity: Not Currently    Birth control/protection: Surgical    Comment: hyst  Other Topics Concern   Not on file  Social History Narrative   Not on file   Social Determinants of Health   Financial Resource Strain: Low Risk   (03/13/2022)   Overall Financial Resource Strain (CARDIA)    Difficulty of Paying Living Expenses: Not very hard  Food Insecurity: No Food Insecurity (03/13/2022)   Hunger Vital Sign    Worried About Running Out of Food in the Last Year: Never true    [place] in the Last Year: Never true  Transportation Needs: No Transportation Needs (03/13/2022)   PRAPARE - Hydrologist (Medical): No    Lack of Transportation (Non-Medical): No  Physical Activity: Insufficiently Active (03/13/2022)   Exercise Vital Sign    Days of Exercise per Week: 1 day    Minutes of Exercise per Session: 90 min  Stress: No Stress Concern Present (03/13/2022)   [place]    Feeling of Stress : Not at all  Social Connections: [place] (03/13/2022)   Social Connection and Isolation Panel [NHANES]    Frequency of Communication with Friends and Family: Twice a week    Frequency of Social Gatherings with Friends and Family: More than three times a week    Attends Religious Services: More than 4 times per year    Active Member of [employer] or Organizations: Yes    Attends Music therapist: More than 4 times per year    Marital Status: Married    Family History  Problem Relation Age of Onset   Heart disease Mother    Hypertension Mother    Diabetes Father    Heart disease Father    Heart disease Maternal Grandmother    Colon cancer Maternal Grandmother 46       died in her 28's   Heart disease Maternal Grandfather    Esophageal cancer Maternal Grandfather 22       died at 57   Diabetes Paternal Grandmother    Heart disease Paternal Grandmother    Kidney cancer Paternal Grandmother 26       died in her 26's   Tuberculosis Paternal Grandfather    Heart disease Paternal Grandfather    Skin cancer Paternal Grandfather 28       died at 44, patient not sure if it was melanoma, BCC, Squamous, etc.    Breast cancer Maternal Aunt 98       died at 38   Breast cancer Cousin 75       she is now in her 64's   Rectal cancer Neg Hx    Stomach cancer Neg Hx  Current Outpatient Medications:    atorvastatin (LIPITOR) 20 MG tablet, Take 1 tablet (20 mg total) by mouth daily., Disp: , Rfl:    latanoprost (XALATAN) 0.005 % ophthalmic solution, Place 1 drop into both eyes at bedtime., Disp: , Rfl:    polyethylene glycol powder (GLYCOLAX/MIRALAX) 17 GM/SCOOP powder, 1 scoop daily or as needed, Disp: 255 g, Rfl: 11   nystatin (MYCOSTATIN) 100000 UNIT/ML suspension, Take 5 mLs (500,000 Units total) by mouth 4 (four) times daily. Swish and spit, Disp: 60 mL, Rfl: 1   pantoprazole (PROTONIX) 40 MG tablet, TAKE 1 TABLET (40 MG TOTAL) BY MOUTH 2 (TWO) TIMES DAILY BEFORE A MEAL., Disp: 180 tablet, Rfl: 1 No current facility-administered medications for this visit.  Facility-Administered Medications Ordered in Other Visits:    heparin lock flush 100 unit/mL, 500 Units, Intracatheter, Once, Magrinat, Virgie Dad, MD   sodium chloride flush (NS) 0.9 % injection 10 mL, 10 mL, Intracatheter, Once, Magrinat, Virgie Dad, MD  Review of Systems  Review of Systems  Constitutional: Negative for fever, chills, weight loss, malaise/fatigue and diaphoresis.  HENT: Negative for hearing loss, ear pain, nosebleeds, congestion, sore throat, neck pain, tinnitus and ear discharge.   Eyes: Negative for blurred vision, double vision, photophobia, pain, discharge and redness.  Respiratory: Negative for cough, hemoptysis, sputum production, shortness of breath, wheezing and stridor.   Cardiovascular: Negative for chest pain, palpitations, orthopnea, claudication, leg swelling and PND.  Gastrointestinal: negative for abdominal pain. Negative for heartburn, nausea, vomiting, diarrhea, constipation, blood in stool and melena.  Genitourinary: Negative for dysuria, urgency, frequency, hematuria and flank pain.  Musculoskeletal:  Negative for myalgias, back pain, joint pain and falls.  Skin: Negative for itching and rash.  Neurological: Negative for dizziness, tingling, tremors, sensory change, speech change, focal weakness, seizures, loss of consciousness, weakness and headaches.  Endo/Heme/Allergies: Negative for environmental allergies and polydipsia. Does not bruise/bleed easily.  Psychiatric/Behavioral: Negative for depression, suicidal ideas, hallucinations, memory loss and substance abuse. The patient is not nervous/anxious and does not have insomnia.        Objective:  Blood pressure 118/63, pulse (!) 43, height 5' 5.5" (1.664 m), weight 168 lb (76.2 kg), last menstrual period 02/11/2014.   Physical Exam  Vitals reviewed. Constitutional: She is oriented to person, place, and time. She appears well-developed and well-nourished.  HENT:  Head: Normocephalic and atraumatic.        Right Ear: External ear normal.  Left Ear: External ear normal.  Nose: Nose normal.  Mouth/Throat: Oropharynx is clear and moist.  Eyes: Conjunctivae and EOM are normal. Pupils are equal, round, and reactive to light. Right eye exhibits no discharge. Left eye exhibits no discharge. No scleral icterus.  Neck: Normal range of motion. Neck supple. No tracheal deviation present. No thyromegaly present.  Cardiovascular: Normal rate, regular rhythm, normal heart sounds and intact distal pulses.  Exam reveals no gallop and no friction rub.   No murmur heard. Respiratory: Effort normal and breath sounds normal. No respiratory distress. She has no wheezes. She has no rales. She exhibits no tenderness.  GI: Soft. Bowel sounds are normal. She exhibits no distension and no mass. There is no tenderness. There is no rebound and no guarding.  Genitourinary:  Breasts no masses skin changes or nipple changes bilaterally      Vulva is normal without lesions Vagina is pink moist without discharge Cervix absent Uterus is absent Adnexa is negative  with normal sized ovaries   Musculoskeletal: Normal range of motion.  She exhibits no edema and no tenderness.  Neurological: She is alert and oriented to person, place, and time. She has normal reflexes. She displays normal reflexes. No cranial nerve deficit. She exhibits normal muscle tone. Coordination normal.  Skin: Skin is warm and dry. No rash noted. No erythema. No pallor.  Psychiatric: She has a normal mood and affect. Her behavior is normal. Judgment and thought content normal.       Medications Ordered at today's visit: Meds ordered this encounter  Medications   polyethylene glycol powder (GLYCOLAX/MIRALAX) 17 GM/SCOOP powder    Sig: 1 scoop daily or as needed    Dispense:  255 g    Refill:  11    Other orders placed at today's visit: No orders of the defined types were placed in this encounter.     Assessment:    Normal Gyn exam.      ICD-10-CM   1. Well woman exam with routine gynecological exam  Z01.419     2. Encounter for gynecological examination with Papanicolaou smear of cervix  Z01.419 Cytology - PAP    3. Slow transit constipation  K59.01     4. 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

## 2022-03-18 LAB — CYTOLOGY - PAP
Comment: NEGATIVE
Diagnosis: UNDETERMINED — AB
High risk HPV: NEGATIVE

## 2022-03-27 ENCOUNTER — Ambulatory Visit (INDEPENDENT_AMBULATORY_CARE_PROVIDER_SITE_OTHER): Payer: 59 | Admitting: Obstetrics & Gynecology

## 2022-03-27 ENCOUNTER — Encounter: Payer: Self-pay | Admitting: Obstetrics & Gynecology

## 2022-03-27 VITALS — BP 101/66 | HR 66 | Ht 65.5 in | Wt 166.0 lb

## 2022-03-27 DIAGNOSIS — G5781 Other specified mononeuropathies of right lower limb: Secondary | ICD-10-CM | POA: Diagnosis not present

## 2022-03-27 NOTE — Progress Notes (Signed)
Trigger Point Injection   Pre-operative diagnosis: right iliohypogastric neuralgia  Post-operative diagnosis: same  After risks and benefits were explained including bleeding, infection, worsening of the pain, damage to the area being injected, weakness, allergic reaction to medications, vascular injection, and nerve damage, signed consent was obtained.  All questions were answered.    The area of the trigger point was identified and the skin prepped three times with alcohol and the alcohol allowed to dry.  Next, a 25 gauge 1.5 inch needle was placed in the area of the trigger point.  Once reproduction of the pain was elicited and negative aspiration confirmed, the trigger point was injected and the needle removed.    The patient did tolerate the procedure well and there were not complications.    Medication used: 10 cc Marcaine 0.5%  Trigger points injected: 1    Trigger point(s) location(s):  right  Total resolution of pain

## 2022-04-11 ENCOUNTER — Other Ambulatory Visit: Payer: Self-pay | Admitting: Internal Medicine

## 2022-04-17 ENCOUNTER — Encounter: Payer: Self-pay | Admitting: Obstetrics & Gynecology

## 2022-04-17 ENCOUNTER — Ambulatory Visit (INDEPENDENT_AMBULATORY_CARE_PROVIDER_SITE_OTHER): Payer: 59 | Admitting: Obstetrics & Gynecology

## 2022-04-17 VITALS — BP 99/60 | HR 59 | Ht 65.0 in | Wt 171.0 lb

## 2022-04-17 DIAGNOSIS — G5781 Other specified mononeuropathies of right lower limb: Secondary | ICD-10-CM

## 2022-04-17 MED ORDER — NYSTATIN 100000 UNIT/ML MT SUSP: 5.0000 mL | Freq: Four times a day (QID) | OROMUCOSAL | 1 refills | Status: DC

## 2022-04-17 NOTE — Progress Notes (Signed)
Trigger Point Injection   Pre-operative diagnosis: right iliohypogastric neuralgia  Post-operative diagnosis: same  After risks and benefits were explained including bleeding, infection, worsening of the pain, damage to the area being injected, weakness, allergic reaction to medications, vascular injection, and nerve damage, signed consent was obtained.  All questions were answered.    The area of the trigger point was identified and the skin prepped three times with alcohol and the alcohol allowed to dry.  Next, a 25 gauge 1.5 inch needle was placed in the area of the trigger point.  Once reproduction of the pain was elicited and negative aspiration confirmed, the trigger point was injected and the needle removed.    The patient did tolerate the procedure well and there were not complications.    Medication used: 20 cc Marcaine 0.5%  Trigger points injected: 1    Trigger point(s) location(s):  right  Total resolution of pain   Florian Buff, MD 04/17/2022 10:18 AM

## 2022-05-16 ENCOUNTER — Ambulatory Visit: Payer: 59 | Admitting: Obstetrics & Gynecology

## 2022-06-21 ENCOUNTER — Other Ambulatory Visit: Payer: Self-pay | Admitting: Obstetrics & Gynecology

## 2022-06-24 ENCOUNTER — Other Ambulatory Visit: Payer: Self-pay | Admitting: Hematology and Oncology

## 2022-06-24 DIAGNOSIS — Z1231 Encounter for screening mammogram for malignant neoplasm of breast: Secondary | ICD-10-CM

## 2022-06-30 ENCOUNTER — Other Ambulatory Visit: Payer: 59

## 2022-06-30 ENCOUNTER — Ambulatory Visit: Payer: 59 | Admitting: Hematology and Oncology

## 2022-07-09 ENCOUNTER — Ambulatory Visit: Payer: 59 | Admitting: Hematology and Oncology

## 2022-07-09 ENCOUNTER — Other Ambulatory Visit: Payer: 59

## 2022-07-15 ENCOUNTER — Ambulatory Visit
Admission: RE | Admit: 2022-07-15 | Discharge: 2022-07-15 | Disposition: A | Discharge status: Home / Self Care | Payer: 59 | Source: Ambulatory Visit | Attending: Hematology and Oncology | Admitting: Hematology and Oncology

## 2022-07-15 DIAGNOSIS — Z1231 Encounter for screening mammogram for malignant neoplasm of breast: Secondary | ICD-10-CM

## 2022-07-16 NOTE — Progress Notes (Signed)
Patient Care Team: Yvone Neu, MD as PCP - General (Family Medicine) Excell Seltzer, MD (Inactive) as Consulting Physician (General Surgery) Magrinat, Virgie Dad, MD (Inactive) as Consulting Physician (Oncology) Eppie Gibson, MD as Attending Physician (Radiation Oncology) Tommie Sams, MD as Referring Physician (Internal Medicine) Levin Erp, PA as Physician Assistant (Gastroenterology)  DIAGNOSIS:  Encounter Diagnosis  Name Primary?   Malignant neoplasm of overlapping sites of left breast in female, estrogen receptor negative ([place])     SUMMARY OF ONCOLOGIC HISTORY: Oncology History  Malignant neoplasm of overlapping sites of left breast in female, estrogen receptor negative ([place])  03/05/2017 Initial Diagnosis   Malignant neoplasm of overlapping sites of left breast in female, estrogen receptor negative ([place])   05/05/2017 Genetic Testing   Patient had genetic testing due to a personal and family history of breast cancer.  She was tested for the [facility] Common Hereditary Cancers Panel. The Hereditary Gene Panel offered by [facility] includes sequencing and/or deletion duplication testing of the following 46 genes: APC, ATM, AXIN2, BARD1, BMPR1A, BRCA1, BRCA2, BRIP1, CDH1, CDKN2A (p14ARF), CDKN2A (p16INK4a), CHEK2, CTNNA1, DICER1, EPCAM (Deletion/duplication testing only), GREM1 (promoter region deletion/duplication testing only), KIT, MEN1, MLH1, MSH2, MSH3, MSH6, MUTYH, NBN, NF1, NHTL1, PALB2, PDGFRA, PMS2, POLD1, POLE, PTEN, RAD50, RAD51C, RAD51D, SDHB, SDHC, SDHD, SMAD4, SMARCA4. STK11, TP53, TSC1, TSC2, and VHL.  The following genes were evaluated for sequence changes only: SDHA and HOXB13 c.251G>A variant only.  Results: No pathogenic mutations identified in the 46 genes tested.  The date of this test report is 05/05/2017.      CHIEF COMPLIANT: Follow-up breast cancer surveillance Establish oncology care with Dr. Lindi Adie   INTERVAL HISTORY: Maria Hester is a 56 y.o. with the above-mentioned breast cancer surveillance. Establish oncology care with Dr. Lindi Adie. She reports no new concerns or symptoms. She doesn't get time for exercise because she works 2 jobs. She states that she lost 50 ponds through diet.    ALLERGIES:  has No Known Allergies.  MEDICATIONS:  Current Outpatient Medications  Medication Sig Dispense Refill   atorvastatin (LIPITOR) 20 MG tablet Take 1 tablet (20 mg total) by mouth daily.     latanoprost (XALATAN) 0.005 % ophthalmic solution Place 1 drop into both eyes at bedtime.     pantoprazole (PROTONIX) 40 MG tablet TAKE 1 TABLET (40 MG TOTAL) BY MOUTH 2 (TWO) TIMES DAILY BEFORE A MEAL. 180 tablet 1   polyethylene glycol powder (GLYCOLAX/MIRALAX) 17 GM/SCOOP powder 1 scoop daily or as needed 255 g 11   No current facility-administered medications for this visit.   Facility-Administered Medications Ordered in Other Visits  Medication Dose Route Frequency Provider Last Rate Last Admin   heparin lock flush 100 unit/mL  500 Units Intracatheter Once Magrinat, Virgie Dad, MD       sodium chloride flush (NS) 0.9 % injection 10 mL  10 mL Intracatheter Once Magrinat, Virgie Dad, MD        PHYSICAL EXAMINATION: ECOG PERFORMANCE STATUS: 1 - Symptomatic but completely ambulatory  Vitals:   07/21/22 1426  BP: (!) 112/59  Pulse: 60  Resp: 18  Temp: (!) 97.5 F (36.4 C)  SpO2: 100%   Filed Weights   07/21/22 1426  Weight: 175 lb 11.2 oz (79.7 kg)    BREAST: No palpable masses or nodules in either right or left breasts. No palpable axillary supraclavicular or infraclavicular adenopathy no breast tenderness or nipple discharge. (exam performed in the presence of a chaperone)  LABORATORY DATA:  I have reviewed the data as listed    Latest Ref Rng & Units 07/21/2022    2:06 PM 06/03/2021    9:11 AM 05/23/2020    9:14 AM  CMP  Glucose 70 - 99 mg/dL 149  93  91   BUN 6 - 20 mg/dL _0 Creatinine 0.44 - 1.00  mg/dL 0.71  0.81  0.73   Sodium 135 - 145 mmol/L 139  143  142   Potassium 3.5 - 5.1 mmol/L 3.7  4.9  4.7   Chloride 98 - 111 mmol/L 106  106  104   CO2 22 - 32 mmol/L _1 Calcium 8.9 - 10.3 mg/dL 8.9  9.8  10.0   Total Protein 6.5 - 8.1 g/dL 6.9  7.3  7.2   Total Bilirubin 0.3 - 1.2 mg/dL 0.4  0.3  0.3   Alkaline Phos 38 - 126 U/L 67  95  95   AST 15 - 41 U/L _2 ALT 0 - 44 U/L _3 Lab Results  Component Value Date   WBC 5.7 07/21/2022   HGB 12.6 07/21/2022   HCT 37.5 07/21/2022   MCV 88.4 07/21/2022   PLT 238 07/21/2022   NEUTROABS 3.2 07/21/2022    ASSESSMENT & PLAN:  Malignant neoplasm of overlapping sites of left breast in female, estrogen receptor negative ([place]) Dr. Virgie Dad patient to establish oncology care 03/02/2017: Left breast biopsy: T3N 1-2 stage IIIa grade 3 IDC ER/PR negative, HER2 amplified, Ki-67 35% 05/05/2017: No genetic alterations 03/31/2017 07/14/2017: TCHP x6 cycles (Perjeta stopped after cycle 1 but resumed from cycle 3) 09/22/2017: Completed Herceptin maintenance therapy 08/19/2017: Residual T1 a N0 grade 2 IDC with negative margins, ER/PR negative HER2 positive 09/22/2017-11/05/2017: Adjuvant radiation  Breast cancer surveillance: 1.  Breast exam 07/21/2022: Benign 2. mammogram 07/15/2022: Benign breast density category C She works 2 jobs.  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  Weight: Patient had gained significant weight postchemotherapy but she has lost 50 pounds since that time by purely dieting.  Return to clinic in 1 year for surveillance and follow-up.  She wishes to follow-up with [place] once a year.    No orders of the defined types were placed in this encounter.  The patient has a good understanding of the overall plan. she agrees with it. she will call with any problems that may develop before the next visit  here. Total time spent: 30 mins including face to face time and time spent for planning, charting and co-ordination of care   Harriette Ohara, MD 07/21/22    I Gardiner Coins am scribing for Dr. Lindi Adie  I have reviewed the above documentation for accuracy and completeness, and I agree with the above.

## 2022-07-18 ENCOUNTER — Other Ambulatory Visit: Payer: Self-pay

## 2022-07-18 DIAGNOSIS — Z171 Estrogen receptor negative status [ER-]: Secondary | ICD-10-CM

## 2022-07-21 ENCOUNTER — Inpatient Hospital Stay (HOSPITAL_BASED_OUTPATIENT_CLINIC_OR_DEPARTMENT_OTHER): Payer: 59 | Admitting: Hematology and Oncology

## 2022-07-21 ENCOUNTER — Inpatient Hospital Stay: Payer: 59 | Attending: Hematology and Oncology

## 2022-07-21 DIAGNOSIS — C50812 Malignant neoplasm of overlapping sites of left female breast: Secondary | ICD-10-CM

## 2022-07-21 DIAGNOSIS — Z803 Family history of malignant neoplasm of breast: Secondary | ICD-10-CM | POA: Insufficient documentation

## 2022-07-21 DIAGNOSIS — Z171 Estrogen receptor negative status [ER-]: Secondary | ICD-10-CM | POA: Diagnosis not present

## 2022-07-21 DIAGNOSIS — Z79899 Other long term (current) drug therapy: Secondary | ICD-10-CM | POA: Insufficient documentation

## 2022-07-21 LAB — CBC WITH DIFFERENTIAL (CANCER CENTER ONLY)
Abs Immature Granulocytes: 0.02 10*3/uL (ref 0.00–0.07)
Basophils Absolute: 0.1 10*3/uL (ref 0.0–0.1)
Basophils Relative: 1 %
Eosinophils Absolute: 0.1 10*3/uL (ref 0.0–0.5)
Eosinophils Relative: 1 %
HCT: 37.5 % (ref 36.0–46.0)
Hemoglobin: 12.6 g/dL (ref 12.0–15.0)
Immature Granulocytes: 0 %
Lymphocytes Relative: 34 %
Lymphs Abs: 1.9 10*3/uL (ref 0.7–4.0)
MCH: 29.7 pg (ref 26.0–34.0)
MCHC: 33.6 g/dL (ref 30.0–36.0)
MCV: 88.4 fL (ref 80.0–100.0)
Monocytes Absolute: 0.4 10*3/uL (ref 0.1–1.0)
Monocytes Relative: 6 %
Neutro Abs: 3.2 10*3/uL (ref 1.7–7.7)
Neutrophils Relative %: 58 %
Platelet Count: 238 10*3/uL (ref 150–400)
RBC: 4.24 MIL/uL (ref 3.87–5.11)
RDW: 13.2 % (ref 11.5–15.5)
WBC Count: 5.7 10*3/uL (ref 4.0–10.5)
nRBC: 0 % (ref 0.0–0.2)

## 2022-07-21 LAB — CMP (CANCER CENTER ONLY)
ALT: 15 U/L (ref 0–44)
AST: 18 U/L (ref 15–41)
Albumin: 4.2 g/dL (ref 3.5–5.0)
Alkaline Phosphatase: 67 U/L (ref 38–126)
Anion gap: 6 (ref 5–15)
BUN: 22 mg/dL — ABNORMAL HIGH (ref 6–20)
CO2: 27 mmol/L (ref 22–32)
Calcium: 8.9 mg/dL (ref 8.9–10.3)
Chloride: 106 mmol/L (ref 98–111)
Creatinine: 0.71 mg/dL (ref 0.44–1.00)
GFR, Estimated: >60 mL/min (ref >60)
Glucose, Bld: 149 mg/dL — ABNORMAL HIGH (ref 70–99)
Potassium: 3.7 mmol/L (ref 3.5–5.1)
Sodium: 139 mmol/L (ref 135–145)
Total Bilirubin: 0.4 mg/dL (ref 0.3–1.2)
Total Protein: 6.9 g/dL (ref 6.5–8.1)

## 2022-07-21 NOTE — Assessment & Plan Note (Addendum)
Dr. Virgie Dad patient to establish oncology care 03/02/2017: Left breast biopsy: T3N 1-2 stage IIIa grade 3 IDC ER/PR negative, HER2 amplified, Ki-67 35% 05/05/2017: No genetic alterations 03/31/2017 07/14/2017: TCHP x6 cycles (Perjeta stopped after cycle 1 but resumed from cycle 3) 09/22/2017: Completed Herceptin maintenance therapy 08/19/2017: Residual T1 a N0 grade 2 IDC with negative margins, ER/PR negative HER2 positive 09/22/2017-11/05/2017: Adjuvant radiation  Breast cancer surveillance: 1.  Breast exam 07/21/2022: Benign 2. mammogram 07/15/2022: Benign breast density category C She works 2 jobs.  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  Return to clinic in 1 year for surveillance and follow-up.  She wishes to follow-up with [place] once a year.

## 2022-08-27 ENCOUNTER — Other Ambulatory Visit: Payer: Self-pay | Admitting: Internal Medicine

## 2023-03-04 ENCOUNTER — Other Ambulatory Visit: Payer: Self-pay | Admitting: Internal Medicine

## 2023-06-02 ENCOUNTER — Other Ambulatory Visit: Payer: Self-pay | Admitting: Hematology and Oncology

## 2023-06-02 DIAGNOSIS — Z1231 Encounter for screening mammogram for malignant neoplasm of breast: Secondary | ICD-10-CM

## 2023-07-13 ENCOUNTER — Encounter: Payer: Self-pay | Admitting: Oncology

## 2023-07-20 ENCOUNTER — Ambulatory Visit
Admission: RE | Admit: 2023-07-20 | Discharge: 2023-07-20 | Disposition: A | Discharge status: Home / Self Care | Payer: BC Managed Care – PPO | Source: Ambulatory Visit | Attending: Hematology and Oncology | Admitting: Hematology and Oncology

## 2023-07-20 DIAGNOSIS — Z1231 Encounter for screening mammogram for malignant neoplasm of breast: Secondary | ICD-10-CM

## 2023-07-23 ENCOUNTER — Inpatient Hospital Stay: Payer: BC Managed Care – PPO | Attending: Hematology and Oncology | Admitting: Hematology and Oncology

## 2023-07-23 VITALS — BP 121/62 | HR 67 | Temp 97.7°F | Resp 18 | Ht 65.0 in | Wt 215.3 lb

## 2023-07-23 DIAGNOSIS — Z171 Estrogen receptor negative status [ER-]: Secondary | ICD-10-CM | POA: Diagnosis not present

## 2023-07-23 DIAGNOSIS — R635 Abnormal weight gain: Secondary | ICD-10-CM | POA: Insufficient documentation

## 2023-07-23 DIAGNOSIS — Z79899 Other long term (current) drug therapy: Secondary | ICD-10-CM | POA: Diagnosis not present

## 2023-07-23 DIAGNOSIS — C50812 Malignant neoplasm of overlapping sites of left female breast: Secondary | ICD-10-CM | POA: Diagnosis present

## 2023-07-23 DIAGNOSIS — Z803 Family history of malignant neoplasm of breast: Secondary | ICD-10-CM | POA: Diagnosis not present

## 2023-07-23 DIAGNOSIS — H409 Unspecified glaucoma: Secondary | ICD-10-CM | POA: Diagnosis not present

## 2023-07-23 DIAGNOSIS — Z1722 Progesterone receptor negative status: Secondary | ICD-10-CM | POA: Diagnosis not present

## 2023-07-23 NOTE — Assessment & Plan Note (Addendum)
Dr. Darrall Dears patient originally 03/02/2017: Left breast biopsy: T3N 1-2 stage IIIa grade 3 IDC ER/PR negative, HER2 amplified, Ki-67 35% 05/05/2017: No genetic alterations 03/31/2017 07/14/2017: TCHP x6 cycles (Perjeta stopped after cycle 1 but resumed from cycle 3) 09/22/2017: Completed Herceptin maintenance therapy 08/19/2017: Residual T1 a N0 grade 2 IDC with negative margins, ER/PR negative HER2 positive 09/22/2017-11/05/2017: Adjuvant radiation   Breast cancer surveillance: 1.  Breast exam 07/23/2023: Benign 2. 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   Weight: Patient in the 50 pounds back that she had lost last year because she stopped doing the diet.   Return to clinic in 1 year for surveillance and follow-up.  She wishes to follow-up with [place] once a year.

## 2023-07-24 ENCOUNTER — Encounter: Payer: Self-pay | Admitting: Oncology

## 2023-07-24 NOTE — Progress Notes (Signed)
Patient Care Team: Maximiano Coss, MD as PCP - General (Family Medicine) Glenna Fellows, MD (Inactive) as Consulting Physician (General Surgery) Magrinat, Valentino Hue, MD (Inactive) as Consulting Physician (Oncology) Lonie Peak, MD as Attending Physician (Radiation Oncology) Lamont Snowball, MD as Referring Physician (Internal Medicine) Unk Lightning, PA as Physician Assistant (Gastroenterology)  DIAGNOSIS:  Encounter Diagnosis  Name Primary?   Malignant neoplasm of overlapping sites of left breast in female, estrogen receptor negative (HCC) Yes    SUMMARY OF ONCOLOGIC HISTORY: Oncology History  Malignant neoplasm of overlapping sites of left breast in female, estrogen receptor negative (HCC)  03/05/2017 Initial Diagnosis   Malignant neoplasm of overlapping sites of left breast in female, estrogen receptor negative (HCC)   05/05/2017 Genetic Testing   Patient had genetic testing due to a personal and family history of breast cancer.  She was tested for the [facility] Common Hereditary Cancers Panel. The Hereditary Gene Panel offered by [facility] includes sequencing and/or deletion duplication testing of the following 46 genes: APC, ATM, AXIN2, BARD1, BMPR1A, BRCA1, BRCA2, BRIP1, CDH1, CDKN2A (p14ARF), CDKN2A (p16INK4a), CHEK2, CTNNA1, DICER1, EPCAM (Deletion/duplication testing only), GREM1 (promoter region deletion/duplication testing only), KIT, MEN1, MLH1, MSH2, MSH3, MSH6, MUTYH, NBN, NF1, NHTL1, PALB2, PDGFRA, PMS2, POLD1, POLE, PTEN, RAD50, RAD51C, RAD51D, SDHB, SDHC, SDHD, SMAD4, SMARCA4. STK11, TP53, TSC1, TSC2, and VHL.  The following genes were evaluated for sequence changes only: SDHA and HOXB13 c.251G>A variant only.  Results: No pathogenic mutations identified in the 46 genes tested.  The date of this test report is 05/05/2017.      CHIEF COMPLIANT: Surveillance of breast cancer    History of Present Illness   Maria Hester, a 57 year old with a history of  breast cancer, presents for a routine follow-up. She reports significant weight gain of 50 pounds over the last eight months after discontinuing a diet that had previously helped her lose 60 pounds. She attributes the weight gain to portion control issues and is currently trying to manage her weight by monitoring her food intake and maintaining a food journal. She denies any sweet cravings and her primary care physician is monitoring her blood sugar levels due to the rapid weight gain.  In addition to the weight gain, she reports experiencing cramps in her breasts, which she manages by applying pressure until the discomfort passes. She also experiences similar cramping in her back. She has decided against breast reconstruction surgery, citing the potential for scar tissue development and a desire to remember her journey through breast cancer.        ALLERGIES:  has No Known Allergies.  MEDICATIONS:  Current Outpatient Medications  Medication Sig Dispense Refill   latanoprost (XALATAN) 0.005 % ophthalmic solution Place 1 drop into both eyes at bedtime.     pantoprazole (PROTONIX) 40 MG tablet TAKE 1 TABLET (40 MG TOTAL) BY MOUTH TWICE A DAY BEFORE MEALS 180 tablet 1   No current facility-administered medications for this visit.   Facility-Administered Medications Ordered in Other Visits  Medication Dose Route Frequency Provider Last Rate Last Admin   heparin lock flush 100 unit/mL  500 Units Intracatheter Once Magrinat, Valentino Hue, MD       sodium chloride flush (NS) 0.9 % injection 10 mL  10 mL Intracatheter Once Magrinat, Valentino Hue, MD        PHYSICAL EXAMINATION: ECOG PERFORMANCE STATUS: 1 - Symptomatic but completely ambulatory  Vitals:   07/23/23 1006  BP: 121/62  Pulse: 67  Resp: 18  Temp: 97.7  F (36.5 C)  SpO2: 99%   Filed Weights   07/23/23 1006  Weight: 215 lb 4.8 oz (97.7 kg)    Physical Exam   BREAST: No tenderness. Scar tissue present.      (exam performed in the  presence of a chaperone)  LABORATORY DATA:  I have reviewed the data as listed    Latest Ref Rng & Units 07/21/2022    2:06 PM 06/03/2021    9:11 AM 05/23/2020    9:14 AM  CMP  Glucose 70 - 99 mg/dL 409  93  91   BUN 6 - 20 mg/dL 22  23  15    Creatinine 0.44 - 1.00 mg/dL 8.11  9.14  7.82   Sodium 135 - 145 mmol/L 139  143  142   Potassium 3.5 - 5.1 mmol/L 3.7  4.9  4.7   Chloride 98 - 111 mmol/L 106  106  104   CO2 22 - 32 mmol/L 27  27  26    Calcium 8.9 - 10.3 mg/dL 8.9  9.8  95.6   Total Protein 6.5 - 8.1 g/dL 6.9  7.3  7.2   Total Bilirubin 0.3 - 1.2 mg/dL 0.4  0.3  0.3   Alkaline Phos 38 - 126 U/L 67  95  95   AST 15 - 41 U/L 18  16  19    ALT 0 - 44 U/L 15  14  20      Lab Results  Component Value Date   WBC 5.7 07/21/2022   HGB 12.6 07/21/2022   HCT 37.5 07/21/2022   MCV 88.4 07/21/2022   PLT 238 07/21/2022   NEUTROABS 3.2 07/21/2022    ASSESSMENT & PLAN:  Malignant neoplasm of overlapping sites of left breast in female, estrogen receptor negative (HCC) Dr. Darrall Dears patient originally 03/02/2017: Left breast biopsy: T3N 1-2 stage IIIa grade 3 IDC ER/PR negative, HER2 amplified, Ki-67 35% 05/05/2017: No genetic alterations 03/31/2017 07/14/2017: TCHP x6 cycles (Perjeta stopped after cycle 1 but resumed from cycle 3) 09/22/2017: Completed Herceptin maintenance therapy 08/19/2017: Residual T1 a N0 grade 2 IDC with negative margins, ER/PR negative HER2 positive 09/22/2017-11/05/2017: Adjuvant radiation   Breast cancer surveillance: 1.  Breast exam 07/23/2023: Benign 2. 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    Breast Cancer 5 years post-diagnosis, no current complaints of pain or discomfort. Noted occasional cramping in the breast    Weight Gain Rapid weight gain after discontinuing a diet. Patient is now focusing on portion control and mindful eating. No current pharmacological  interventions due to contraindications with glaucoma drops and insurance coverage. -Encourage continued mindful eating and portion control. -Consider referral to a dietitian or weight management program if weight gain continues or if patient requests additional support.     General Health Maintenance -Encourage continued physical activity as tolerated.     Return to clinic in 1 year for surveillance and follow-up.  She wishes to follow-up with [place] once a year.    No orders of the defined types were placed in this encounter.  The patient has a good understanding of the overall plan. she agrees with it. she will call with any problems that may develop before the next visit here. Total time spent: 30 mins including face to face time and time spent for planning, charting and co-ordination of care   Tamsen Meek, MD 07/24/23

## 2023-09-01 ENCOUNTER — Encounter: Payer: Self-pay | Admitting: Oncology

## 2023-09-14 ENCOUNTER — Ambulatory Visit (AMBULATORY_SURGERY_CENTER): Payer: BC Managed Care – PPO

## 2023-09-14 VITALS — Ht 65.0 in | Wt 213.0 lb

## 2023-09-14 DIAGNOSIS — Z800 Family history of malignant neoplasm of digestive organs: Secondary | ICD-10-CM

## 2023-09-14 DIAGNOSIS — Z860100 Personal history of colon polyps, unspecified: Secondary | ICD-10-CM

## 2023-09-14 MED ORDER — NA SULFATE-K SULFATE-MG SULF 17.5-3.13-1.6 GM/177ML PO SOLN
1.0000 | Freq: Once | ORAL | 0 refills | Status: AC
Start: 2023-09-14 — End: 2023-09-14

## 2023-09-14 NOTE — Progress Notes (Signed)

## 2023-10-05 ENCOUNTER — Encounter: Payer: Self-pay | Admitting: Internal Medicine

## 2023-10-13 ENCOUNTER — Encounter: Payer: Self-pay | Admitting: Internal Medicine

## 2023-10-13 ENCOUNTER — Ambulatory Visit: Payer: BC Managed Care – PPO | Admitting: Internal Medicine

## 2023-10-13 VITALS — BP 162/66 | HR 67 | Temp 98.1°F | Resp 13

## 2023-10-13 DIAGNOSIS — Z860100 Personal history of colon polyps, unspecified: Secondary | ICD-10-CM

## 2023-10-13 DIAGNOSIS — Z1211 Encounter for screening for malignant neoplasm of colon: Secondary | ICD-10-CM | POA: Diagnosis present

## 2023-10-13 DIAGNOSIS — Z800 Family history of malignant neoplasm of digestive organs: Secondary | ICD-10-CM

## 2023-10-13 DIAGNOSIS — Z860101 Personal history of adenomatous and serrated colon polyps: Secondary | ICD-10-CM

## 2023-10-13 DIAGNOSIS — D175 Benign lipomatous neoplasm of intra-abdominal organs: Secondary | ICD-10-CM

## 2023-10-13 DIAGNOSIS — K5730 Diverticulosis of large intestine without perforation or abscess without bleeding: Secondary | ICD-10-CM | POA: Diagnosis not present

## 2023-10-13 MED ORDER — SODIUM CHLORIDE 0.9 % IV SOLN: 500.0000 mL | INTRAVENOUS | Status: DC

## 2023-10-13 NOTE — Progress Notes (Signed)
Vss nad trans to pacu 

## 2023-10-13 NOTE — Progress Notes (Signed)
 GASTROENTEROLOGY PROCEDURE H&P NOTE   Primary Care Physician: Donnise Norleen Lenis, MD    Reason for Procedure:  History of nonadvanced adenoma of colon  Plan:    Colonoscopy  Patient is appropriate for endoscopic procedure(s) in the ambulatory ([facility]) setting.  The nature of the procedure, as well as the risks, benefits, and alternatives were carefully and thoroughly reviewed with the patient. Ample time for discussion and questions allowed. The patient understood, was satisfied, and agreed to proceed.     HPI: Maria Hester is a 57 y.o. female who presents for surveillance colonoscopy.  Medical history as below.  Tolerated the prep.  No recent chest pain or shortness of breath.  No abdominal pain today.  Past Medical History:  Diagnosis Date   Breast cancer (HCC) 2018   Last chemo 0ct 2018, last radiation Jan. 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   Erosive esophagitis    Family history of breast cancer    Frozen shoulder    left   Gallbladder disease    sludge in gallbladder,    Glaucoma    Hiatal hernia    History of hiatal hernia    Hx of thyroid nodule    benign   Hyperlipidemia    Personal history of chemotherapy    Personal history of radiation therapy    Thyroid disease    Thyroid nodules - bx was negative   Uterine fibroid     Past Surgical History:  Procedure Laterality Date   ABDOMINAL HYSTERECTOMY     ABDOMINOPLASTY N/A 02/28/2014   Procedure: ABDOMINOPLASTY with Panniculectomy;  Surgeon: Norleen LULLA Server, MD;  Location: [facility];  Service: Gynecology;  Laterality: N/A;   APPENDECTOMY     BILATERAL SALPINGECTOMY Bilateral 02/28/2014   Procedure: BILATERAL SALPINGECTOMY;  Surgeon: Norleen LULLA Server, MD;   Location: [facility];  Service: Gynecology;  Laterality: Bilateral;   BREAST BIOPSY Bilateral 08/16/2018   BREAST LUMPECTOMY Left 2018   BREAST LUMPECTOMY WITH RADIOACTIVE SEED AND SENTINEL LYMPH NODE BIOPSY Left 08/19/2017   Procedure: LEFT BREAST LUMPECTOMY WITH RADIOACTIVE SEED AND LEFT SENTINEL LYMPH NODE BIOPSY;  Surgeon: Mikell Katz, MD;  Location: [facility];  Service: General;  Laterality: Left;   CATARACT EXTRACTION, BILATERAL     CHOLECYSTECTOMY N/A 08/19/2017   Procedure: LAPAROSCOPIC CHOLECYSTECTOMY WITH INTRAOPERATIVE CHOLANGIOGRAM;  Surgeon: Mikell Katz, MD;  Location: [facility];  Service: General;  Laterality: N/A;   PORTACATH PLACEMENT Right 03/18/2017   Procedure: INSERTION PORT-A-CATH;  Surgeon: Mikell Katz, MD;  Location: [facility];  Service: General;  Laterality: Right;   removal portacath  08/2017   reverse tubal ligation     SCAR REVISION N/A 02/28/2014   Procedure: SCAR REVISION;  Surgeon: Norleen LULLA Server, MD;  Location: [facility];  Service: Gynecology;  Laterality: N/A;   SUPRACERVICAL ABDOMINAL HYSTERECTOMY N/A 02/28/2014   Procedure: HYSTERECTOMY SUPRACERVICAL ABDOMINAL;  Surgeon: Norleen LULLA Server, MD;  Location: [facility];  Service: Gynecology;  Laterality: N/A;   TUBAL LIGATION     WISDOM TOOTH EXTRACTION      Prior to Admission medications   Medication Sig Start Date End Date Taking? Authorizing Provider  pantoprazole  (PROTONIX ) 40 MG tablet TAKE 1 TABLET (40 MG TOTAL) BY  MOUTH TWICE A DAY BEFORE MEALS 08/27/22  Yes Ernesto Lashway, Gordy HERO, MD  amoxicillin-clavulanate (AUGMENTIN) 875-125 MG tablet Take 1 tablet by mouth 2 (two) times daily. 08/23/23   [provider]  latanoprost (XALATAN) 0.005 % ophthalmic solution Place 1 drop into both eyes at bedtime. 01/15/19   [provider]    Current Outpatient Medications  Medication Sig Dispense Refill   pantoprazole  (PROTONIX ) 40 MG tablet TAKE 1 TABLET (40 MG TOTAL) BY MOUTH TWICE A DAY BEFORE MEALS 180 tablet 1    amoxicillin-clavulanate (AUGMENTIN) 875-125 MG tablet Take 1 tablet by mouth 2 (two) times daily.     latanoprost (XALATAN) 0.005 % ophthalmic solution Place 1 drop into both eyes at bedtime.     Current Facility-Administered Medications  Medication Dose Route Frequency Provider Last Rate Last Admin   0.9 %  sodium chloride  infusion  500 mL Intravenous Continuous Ally Knodel, Gordy HERO, MD       Facility-Administered Medications Ordered in Other Visits  Medication Dose Route Frequency Provider Last Rate Last Admin   heparin  lock flush 100 unit/mL  500 Units Intracatheter Once Magrinat, Sandria BROCKS, MD       sodium chloride  flush (NS) 0.9 % injection 10 mL  10 mL Intracatheter Once Magrinat, Sandria BROCKS, MD        Allergies as of 10/13/2023   (No Known Allergies)    Family History  Problem Relation Age of Onset   Colon polyps Mother    Rectal cancer Mother 77   Colon cancer Mother 45   Heart disease Mother    Hypertension Mother    Diabetes Father    Heart disease Father    Breast cancer Maternal Aunt 50       died at 29   Colon cancer Maternal Uncle 47 - 65   Colon polyps Maternal Grandmother    Heart disease Maternal Grandmother    Colon cancer Maternal Grandmother 40       died in her 44's   Heart disease Maternal Grandfather    Esophageal cancer Maternal Grandfather 50       died at 63   Diabetes Paternal Grandmother    Heart disease Paternal Grandmother    Kidney cancer Paternal Grandmother 20       died in her 55's   Tuberculosis Paternal Grandfather    Heart disease Paternal Grandfather    Skin cancer Paternal Grandfather 12       died at 30, patient not sure if it was melanoma, BCC, Squamous, etc.   Breast cancer Cousin 66       she is now in her 26's   Stomach cancer Neg Hx     Social History   Socioeconomic History   Marital status: Married    Spouse name: Not on file   Number of children: Not on file   Years of education: Not on file   Highest education level:  Not on file  Occupational History   Not on file  Tobacco Use   Smoking status: Former    Current packs/day: 0.00    Average packs/day: 0.5 packs/day for 34.0 years (17.0 ttl pk-yrs)    Types: Cigarettes    Start date: 10/14/1979    Quit date: 10/13/2013    Years since quitting: 10.0   Smokeless tobacco: Never  Vaping Use   Vaping status: Never Used  Substance and Sexual Activity   Alcohol use: No   Drug use: No   Sexual activity: Not  Currently    Birth control/protection: Surgical    Comment: hyst  Other Topics Concern   Not on file  Social History Narrative   Not on file   Social Drivers of Health   Financial Resource Strain: Low Risk  (03/13/2022)   Overall Financial Resource Strain (CARDIA)    Difficulty of Paying Living Expenses: Not very hard  Food Insecurity: No Food Insecurity (03/13/2022)   Hunger Vital Sign    Worried About Running Out of Food in the Last Year: Never true    Ran Out of Food in the Last Year: Never true  Transportation Needs: No Transportation Needs (03/13/2022)   PRAPARE - Administrator, Civil Service (Medical): No    Lack of Transportation (Non-Medical): No  Physical Activity: Insufficiently Active (03/13/2022)   Exercise Vital Sign    Days of Exercise per Week: 1 day    Minutes of Exercise per Session: 90 min  Stress: No Stress Concern Present (03/13/2022)   [employer] of Occupational Health - Occupational Stress Questionnaire    Feeling of Stress : Not at all  Social Connections: Socially Integrated (03/13/2022)   Social Connection and Isolation Panel [NHANES]    Frequency of Communication with Friends and Family: Twice a week    Frequency of Social Gatherings with Friends and Family: More than three times a week    Attends Religious Services: More than 4 times per year    Active Member of [employer] or Organizations: Yes    Attends Engineer, Structural: More than 4 times per year    Marital Status: Married  Catering Manager  Violence: Not At Risk (03/13/2022)   Humiliation, Afraid, Rape, and Kick questionnaire    Fear of Current or Ex-Partner: No    Emotionally Abused: No    Physically Abused: No    Sexually Abused: No    Physical Exam: Vital signs in last 24 hours: @Temp  98.1 F (36.7 C)   LMP 02/11/2014 Comment: SCH GEN: NAD EYE: Sclerae anicteric ENT: MMM CV: Non-tachycardic Pulm: CTA b/l GI: Soft, NT/ND NEURO:  Alert & Oriented x 3   Gordy Starch, MD [facility]  10/13/2023 10:42 AM

## 2023-10-13 NOTE — Patient Instructions (Signed)

## 2023-10-13 NOTE — Progress Notes (Signed)
 Pt's states no medical or surgical changes since previsit or office visit.

## 2023-10-13 NOTE — Op Note (Signed)
 [facility] Patient Name: Maria Hester Procedure Date: 10/13/2023 10:37 AM MRN: [id number] Endoscopist: Gordy CHRISTELLA Starch , MD, [id number] Age: 57 Referring MD:  Date of Birth: 08-05-1966 Gender: Female Account #: [id number] Procedure:                Colonoscopy Indications:              High risk colon cancer surveillance: Personal                            history of non-advanced adenoma, Family history of                            colon cancer in a first-degree relative after age                            13 years (mother), Last colonoscopy: September 2019                            (TA x 1) Medicines:                Monitored Anesthesia Care Procedure:                Pre-Anesthesia Assessment:                           - Prior to the procedure, a History and Physical                            was performed, and patient medications and                            allergies were reviewed. The patient's tolerance of                            previous anesthesia was also reviewed. The risks                            and benefits of the procedure and the sedation                            options and risks were discussed with the patient.                            All questions were answered, and informed consent                            was obtained. Prior Anticoagulants: The patient has                            taken no anticoagulant or antiplatelet agents. ASA                            Grade Assessment: II - A patient with mild systemic  disease. After reviewing the risks and benefits,                            the patient was deemed in satisfactory condition to                            undergo the procedure.                           After obtaining informed consent, the colonoscope                            was passed under direct vision. Throughout the                            procedure, the patient's blood pressure, pulse, and                             oxygen saturations were monitored continuously. The                            Olympus Scope SN: [id number] was introduced through                            the anus and advanced to the cecum, identified by                            appendiceal orifice and ileocecal valve. The                            colonoscopy was somewhat difficult due to a                            redundant colon. Successful completion of the                            procedure was aided by applying abdominal pressure.                            The patient tolerated the procedure well. The                            quality of the bowel preparation was good after                            copious irrigation and lavage. The ileocecal valve,                            appendiceal orifice, and rectum were photographed. Scope In: 10:51:26 AM Scope Out: 11:12:11 AM Scope Withdrawal Time: 0 hours 11 minutes 53 seconds  Total Procedure Duration: 0 hours 20 minutes 45 seconds  Findings:                 The digital rectal exam was normal.  A few small-mouthed diverticula were found in the                            sigmoid colon and descending colon.                           There was a small lipoma, in the sigmoid colon.                           The exam was otherwise without abnormality on                            direct and retroflexion views. Complications:            No immediate complications. Estimated Blood Loss:     Estimated blood loss: none. Impression:               - Diverticulosis in the sigmoid colon and in the                            descending colon. Few and mild.                           - Small lipoma in the sigmoid colon.                           - The examination was otherwise normal on direct                            and retroflexion views.                           - No specimens collected. Recommendation:           - Patient has a  contact number available for                            emergencies. The signs and symptoms of potential                            delayed complications were discussed with the                            patient. Return to normal activities tomorrow.                            Written discharge instructions were provided to the                            patient.                           - Resume previous diet.                           - Continue present medications.                           -  Repeat colonoscopy in 5 years for surveillance                            using 2-day prep. Gordy CHRISTELLA Starch, MD 10/13/2023 11:18:07 AM This report has been signed electronically.

## 2023-10-16 ENCOUNTER — Telehealth: Payer: Self-pay

## 2023-10-16 NOTE — Telephone Encounter (Signed)
 Attempted f/u call. No answer, left VM.

## 2023-12-29 ENCOUNTER — Telehealth: Payer: Self-pay | Admitting: Internal Medicine

## 2023-12-29 MED ORDER — PANTOPRAZOLE SODIUM 40 MG PO TBEC: DELAYED_RELEASE_TABLET | ORAL | 1 refills | Status: DC

## 2023-12-29 NOTE — Telephone Encounter (Signed)
 Patient called and stated that she is needing a refill on her medication Pantoprazole. Please advise.

## 2023-12-29 NOTE — Telephone Encounter (Signed)
 Prescription sent to patient's pharmacy.

## 2024-06-01 ENCOUNTER — Other Ambulatory Visit: Payer: Self-pay | Admitting: Hematology and Oncology

## 2024-06-01 DIAGNOSIS — Z1231 Encounter for screening mammogram for malignant neoplasm of breast: Secondary | ICD-10-CM

## 2024-07-20 ENCOUNTER — Ambulatory Visit
Admission: RE | Admit: 2024-07-20 | Discharge: 2024-07-20 | Disposition: A | Source: Ambulatory Visit | Attending: Hematology and Oncology

## 2024-07-20 DIAGNOSIS — Z1231 Encounter for screening mammogram for malignant neoplasm of breast: Secondary | ICD-10-CM

## 2024-07-25 ENCOUNTER — Inpatient Hospital Stay: Payer: BC Managed Care – PPO | Attending: Hematology and Oncology | Admitting: Hematology and Oncology

## 2024-07-25 VITALS — BP 114/75 | HR 64 | Temp 97.5°F | Resp 16 | Wt 184.7 lb

## 2024-07-25 DIAGNOSIS — Z171 Estrogen receptor negative status [ER-]: Secondary | ICD-10-CM | POA: Diagnosis not present

## 2024-07-25 DIAGNOSIS — Z79899 Other long term (current) drug therapy: Secondary | ICD-10-CM | POA: Diagnosis not present

## 2024-07-25 DIAGNOSIS — C50812 Malignant neoplasm of overlapping sites of left female breast: Secondary | ICD-10-CM | POA: Diagnosis not present

## 2024-07-25 DIAGNOSIS — Z853 Personal history of malignant neoplasm of breast: Secondary | ICD-10-CM | POA: Diagnosis present

## 2024-07-25 DIAGNOSIS — Z800 Family history of malignant neoplasm of digestive organs: Secondary | ICD-10-CM | POA: Diagnosis not present

## 2024-07-25 NOTE — Progress Notes (Signed)
 Patient Care Team: Donnise Norleen Lenis, MD as PCP - General (Family Medicine) Izell Domino, MD as Attending Physician (Radiation Oncology) Nicholaus Senior, MD as Referring Physician (Internal Medicine) Beather Delon Gibson, PA as Physician Assistant (Gastroenterology)  DIAGNOSIS:  Encounter Diagnosis  Name Primary?   Malignant neoplasm of overlapping sites of left breast in female, estrogen receptor negative (HCC) Yes    SUMMARY OF ONCOLOGIC HISTORY: Oncology History  Malignant neoplasm of overlapping sites of left breast in female, estrogen receptor negative (HCC)  03/05/2017 Initial Diagnosis   Malignant neoplasm of overlapping sites of left breast in female, estrogen receptor negative (HCC)   05/05/2017 Genetic Testing   Patient had genetic testing due to a personal and family history of breast cancer.  She was tested for the [facility] Common Hereditary Cancers Panel. The Hereditary Gene Panel offered by [facility] includes sequencing and/or deletion duplication testing of the following 46 genes: APC, ATM, AXIN2, BARD1, BMPR1A, BRCA1, BRCA2, BRIP1, CDH1, CDKN2A (p14ARF), CDKN2A (p16INK4a), CHEK2, CTNNA1, DICER1, EPCAM (Deletion/duplication testing only), GREM1 (promoter region deletion/duplication testing only), KIT, MEN1, MLH1, MSH2, MSH3, MSH6, MUTYH, NBN, NF1, NHTL1, PALB2, PDGFRA, PMS2, POLD1, POLE, PTEN, RAD50, RAD51C, RAD51D, SDHB, SDHC, SDHD, SMAD4, SMARCA4. STK11, TP53, TSC1, TSC2, and VHL.  The following genes were evaluated for sequence changes only: SDHA and HOXB13 c.251G>A variant only.  Results: No pathogenic mutations identified in the 46 genes tested.  The date of this test report is 05/05/2017.      CHIEF COMPLIANT: Surveillance of breast cancer  HISTORY OF PRESENT ILLNESS:  History of Present Illness Maria Hester is a 58 year old female with a history of breast cancer who presents for routine follow-up and discussion of surveillance options.  She is  interested in discussing novel surveillance options, including a new blood test called Gardant, which can detect cancer recurrences anywhere in the body up to a year before she might appear on a scan. Her recent mammograms were normal. Her family history includes her mother's recent colon cancer diagnosis, which influences her interest in additional surveillance options.     ALLERGIES:  has no known allergies.  MEDICATIONS:  Current Outpatient Medications  Medication Sig Dispense Refill   pantoprazole  (PROTONIX ) 40 MG tablet TAKE 1 TABLET (40 MG TOTAL) BY MOUTH TWICE A DAY BEFORE MEALS 180 tablet 1   No current facility-administered medications for this visit.   Facility-Administered Medications Ordered in Other Visits  Medication Dose Route Frequency Provider Last Rate Last Admin   heparin  lock flush 100 unit/mL  500 Units Intracatheter Once Magrinat, Sandria BROCKS, MD       sodium chloride  flush (NS) 0.9 % injection 10 mL  10 mL Intracatheter Once Magrinat, Sandria BROCKS, MD        PHYSICAL EXAMINATION: ECOG PERFORMANCE STATUS: 1 - Symptomatic but completely ambulatory  Vitals:   07/25/24 0957  BP: 114/75  Pulse: 64  Resp: 16  Temp: (!) 97.5 F (36.4 C)  SpO2: 100%   Filed Weights   07/25/24 0957  Weight: 184 lb 11.2 oz (83.8 kg)    Physical Exam No palpable lumps or nodules in bilateral breasts or axilla.  (exam performed in the presence of a chaperone)  LABORATORY DATA:  I have reviewed the data as listed    Latest Ref Rng & Units 07/21/2022    2:06 PM 06/03/2021    9:11 AM 05/23/2020    9:14 AM  CMP  Glucose 70 - 99 mg/dL 850  93  91  BUN 6 - 20 mg/dL 22  23  15    Creatinine 0.44 - 1.00 mg/dL 9.28  9.18  9.26   Sodium 135 - 145 mmol/L 139  143  142   Potassium 3.5 - 5.1 mmol/L 3.7  4.9  4.7   Chloride 98 - 111 mmol/L 106  106  104   CO2 22 - 32 mmol/L 27  27  26    Calcium 8.9 - 10.3 mg/dL 8.9  9.8  89.9   Total Protein 6.5 - 8.1 g/dL 6.9  7.3  7.2   Total Bilirubin  0.3 - 1.2 mg/dL 0.4  0.3  0.3   Alkaline Phos 38 - 126 U/L 67  95  95   AST 15 - 41 U/L 18  16  19    ALT 0 - 44 U/L 15  14  20      Lab Results  Component Value Date   WBC 5.7 07/21/2022   HGB 12.6 07/21/2022   HCT 37.5 07/21/2022   MCV 88.4 07/21/2022   PLT 238 07/21/2022   NEUTROABS 3.2 07/21/2022    ASSESSMENT & PLAN:  Malignant neoplasm of overlapping sites of left breast in female, estrogen receptor negative (HCC) Dr. Tauna patient originally 03/02/2017: Left breast biopsy: T3N 1-2 stage IIIa grade 3 IDC ER/PR negative, HER2 amplified, Ki-67 35% 05/05/2017: No genetic alterations 03/31/2017 07/14/2017: TCHP x6 cycles (Perjeta  stopped after cycle 1 but resumed from cycle 3) 09/22/2017: Completed Herceptin  maintenance therapy 08/19/2017: Residual T1 a N0 grade 2 IDC with negative margins, ER/PR negative HER2 positive 09/22/2017-11/05/2017: Adjuvant radiation   Breast cancer surveillance: 1.  Breast exam 07/25/2024: Benign 2. 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  Return to clinic in 1 year for follow-up      No orders of the defined types were placed in this encounter.  The patient has a good understanding of the overall plan. she agrees with it. she will call with any problems that may develop before the next visit here.  I personally spent a total of 30 minutes in the care of the patient today including preparing to see the patient, getting/reviewing separately obtained history, performing a medically appropriate exam/evaluation, counseling and educating, placing orders, referring and communicating with other health care professionals, documenting clinical information in the EHR, independently interpreting results, communicating results, and coordinating care.   Viinay K Anielle Headrick, MD 07/25/24

## 2024-07-25 NOTE — Assessment & Plan Note (Signed)
 Dr. Tauna patient originally 03/02/2017: Left breast biopsy: T3N 1-2 stage IIIa grade 3 IDC ER/PR negative, HER2 amplified, Ki-67 35% 05/05/2017: No genetic alterations 03/31/2017 07/14/2017: TCHP x6 cycles (Perjeta  stopped after cycle 1 but resumed from cycle 3) 09/22/2017: Completed Herceptin  maintenance therapy 08/19/2017: Residual T1 a N0 grade 2 IDC with negative margins, ER/PR negative HER2 positive 09/22/2017-11/05/2017: Adjuvant radiation   Breast cancer surveillance: 1.  Breast exam 07/25/2024: Benign 2. 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  Return to clinic in 1 year for follow-up

## 2024-07-25 NOTE — Addendum Note (Signed)
 Addended by: BONNETTA RAVEL L on: 07/25/2024 10:34 AM   Modules accepted: Orders

## 2024-08-01 ENCOUNTER — Telehealth: Payer: Self-pay

## 2024-08-01 NOTE — Telephone Encounter (Signed)
 Per MD Guardant reveal order was emailed/scan to guardant rep.

## 2024-08-17 ENCOUNTER — Encounter: Payer: Self-pay | Admitting: Hematology and Oncology

## 2024-08-17 ENCOUNTER — Encounter: Payer: Self-pay | Admitting: *Deleted

## 2025-07-25 ENCOUNTER — Inpatient Hospital Stay: Attending: Hematology and Oncology | Admitting: Hematology and Oncology
